# Patient Record
Sex: Male | Born: 1940 | Race: White | Hispanic: No | Marital: Married | Smoking: Never smoker
Health system: Southern US, Community
[De-identification: ages and names within clinical notes are randomized; demographics above are authoritative.]

## PROBLEM LIST (undated history)

## (undated) DIAGNOSIS — E039 Hypothyroidism, unspecified: Secondary | ICD-10-CM

## (undated) DIAGNOSIS — M353 Polymyalgia rheumatica: Secondary | ICD-10-CM

## (undated) DIAGNOSIS — R1310 Dysphagia, unspecified: Secondary | ICD-10-CM

## (undated) DIAGNOSIS — R05 Cough: Secondary | ICD-10-CM

## (undated) DIAGNOSIS — D696 Thrombocytopenia, unspecified: Secondary | ICD-10-CM

## (undated) DIAGNOSIS — M7989 Other specified soft tissue disorders: Secondary | ICD-10-CM

## (undated) DIAGNOSIS — R5383 Other fatigue: Secondary | ICD-10-CM

## (undated) DIAGNOSIS — IMO0001 Reserved for inherently not codable concepts without codable children: Secondary | ICD-10-CM

## (undated) DIAGNOSIS — D759 Disease of blood and blood-forming organs, unspecified: Secondary | ICD-10-CM

## (undated) DIAGNOSIS — N529 Male erectile dysfunction, unspecified: Secondary | ICD-10-CM

## (undated) DIAGNOSIS — M5030 Other cervical disc degeneration, unspecified cervical region: Secondary | ICD-10-CM

## (undated) DIAGNOSIS — R630 Anorexia: Secondary | ICD-10-CM

## (undated) DIAGNOSIS — I10 Essential (primary) hypertension: Secondary | ICD-10-CM

## (undated) DIAGNOSIS — K922 Gastrointestinal hemorrhage, unspecified: Secondary | ICD-10-CM

## (undated) DIAGNOSIS — E669 Obesity, unspecified: Secondary | ICD-10-CM

## (undated) DIAGNOSIS — R011 Cardiac murmur, unspecified: Secondary | ICD-10-CM

## (undated) DIAGNOSIS — E785 Hyperlipidemia, unspecified: Secondary | ICD-10-CM

## (undated) DIAGNOSIS — I890 Lymphedema, not elsewhere classified: Secondary | ICD-10-CM

## (undated) DIAGNOSIS — K589 Irritable bowel syndrome without diarrhea: Secondary | ICD-10-CM

## (undated) DIAGNOSIS — D649 Anemia, unspecified: Secondary | ICD-10-CM

## (undated) DIAGNOSIS — E079 Disorder of thyroid, unspecified: Secondary | ICD-10-CM

## (undated) DIAGNOSIS — I2510 Atherosclerotic heart disease of native coronary artery without angina pectoris: Secondary | ICD-10-CM

## (undated) DIAGNOSIS — R059 Cough, unspecified: Secondary | ICD-10-CM

## (undated) DIAGNOSIS — C801 Malignant (primary) neoplasm, unspecified: Secondary | ICD-10-CM

## (undated) DIAGNOSIS — Z5189 Encounter for other specified aftercare: Secondary | ICD-10-CM

## (undated) DIAGNOSIS — J189 Pneumonia, unspecified organism: Secondary | ICD-10-CM

## (undated) DIAGNOSIS — K219 Gastro-esophageal reflux disease without esophagitis: Secondary | ICD-10-CM

## (undated) HISTORY — DX: Anemia, unspecified: D64.9

## (undated) HISTORY — DX: Hyperlipidemia, unspecified: E78.5

## (undated) HISTORY — DX: Other fatigue: R53.83

## (undated) HISTORY — PX: OTHER SURGICAL HISTORY: SHX169

## (undated) HISTORY — PX: BONE MARROW BIOPSY: SHX199

## (undated) HISTORY — DX: Dysphagia, unspecified: R13.10

## (undated) HISTORY — DX: Atherosclerotic heart disease of native coronary artery without angina pectoris: I25.10

## (undated) HISTORY — PX: TONSILLECTOMY AND ADENOIDECTOMY: SUR1326

## (undated) HISTORY — DX: Male erectile dysfunction, unspecified: N52.9

## (undated) HISTORY — DX: Essential (primary) hypertension: I10

## (undated) HISTORY — DX: Other cervical disc degeneration, unspecified cervical region: M50.30

## (undated) HISTORY — DX: Cough, unspecified: R05.9

## (undated) HISTORY — DX: Other specified soft tissue disorders: M79.89

## (undated) HISTORY — DX: Anorexia: R63.0

## (undated) HISTORY — DX: Cough: R05

## (undated) HISTORY — DX: Obesity, unspecified: E66.9

## (undated) HISTORY — DX: Disorder of thyroid, unspecified: E07.9

## (undated) HISTORY — DX: Thrombocytopenia, unspecified: D69.6

---

## 1965-04-18 HISTORY — PX: APPENDECTOMY: SHX54

## 1999-02-11 ENCOUNTER — Ambulatory Visit (HOSPITAL_BASED_OUTPATIENT_CLINIC_OR_DEPARTMENT_OTHER): Admission: RE | Admit: 1999-02-11 | Discharge: 1999-02-11 | Payer: Self-pay | Admitting: Plastic Surgery

## 2001-09-17 ENCOUNTER — Ambulatory Visit (HOSPITAL_COMMUNITY): Admission: RE | Admit: 2001-09-17 | Discharge: 2001-09-17 | Payer: Self-pay | Admitting: Gastroenterology

## 2002-12-05 ENCOUNTER — Ambulatory Visit (HOSPITAL_COMMUNITY): Admission: RE | Admit: 2002-12-05 | Discharge: 2002-12-05 | Payer: Self-pay | Admitting: Gastroenterology

## 2003-04-19 HISTORY — PX: NASAL SINUS SURGERY: SHX719

## 2003-07-08 ENCOUNTER — Encounter: Admission: RE | Admit: 2003-07-08 | Discharge: 2003-07-08 | Payer: Self-pay | Admitting: Otolaryngology

## 2003-07-09 ENCOUNTER — Ambulatory Visit (HOSPITAL_COMMUNITY): Admission: RE | Admit: 2003-07-09 | Discharge: 2003-07-09 | Payer: Self-pay | Admitting: Otolaryngology

## 2003-07-09 ENCOUNTER — Encounter (INDEPENDENT_AMBULATORY_CARE_PROVIDER_SITE_OTHER): Payer: Self-pay | Admitting: *Deleted

## 2003-07-09 ENCOUNTER — Ambulatory Visit (HOSPITAL_BASED_OUTPATIENT_CLINIC_OR_DEPARTMENT_OTHER): Admission: RE | Admit: 2003-07-09 | Discharge: 2003-07-09 | Payer: Self-pay | Admitting: Otolaryngology

## 2004-04-18 HISTORY — PX: CORONARY ANGIOPLASTY WITH STENT PLACEMENT: SHX49

## 2004-06-11 ENCOUNTER — Encounter
Admission: RE | Admit: 2004-06-11 | Discharge: 2004-06-11 | Payer: Self-pay | Admitting: Physical Medicine and Rehabilitation

## 2005-01-28 ENCOUNTER — Inpatient Hospital Stay (HOSPITAL_COMMUNITY): Admission: AD | Admit: 2005-01-28 | Discharge: 2005-02-01 | Payer: Self-pay | Admitting: Cardiovascular Disease

## 2005-01-31 HISTORY — PX: CARDIAC CATHETERIZATION: SHX172

## 2005-02-28 HISTORY — PX: US ECHOCARDIOGRAPHY: HXRAD669

## 2005-10-19 ENCOUNTER — Ambulatory Visit (HOSPITAL_COMMUNITY): Admission: RE | Admit: 2005-10-19 | Discharge: 2005-10-19 | Payer: Self-pay | Admitting: Family Medicine

## 2006-03-18 HISTORY — PX: OTHER SURGICAL HISTORY: SHX169

## 2007-04-30 ENCOUNTER — Encounter: Admission: RE | Admit: 2007-04-30 | Discharge: 2007-04-30 | Payer: Self-pay | Admitting: Neurosurgery

## 2007-08-21 ENCOUNTER — Encounter: Payer: Self-pay | Admitting: Pulmonary Disease

## 2007-09-07 ENCOUNTER — Ambulatory Visit: Payer: Self-pay | Admitting: Pulmonary Disease

## 2007-09-07 DIAGNOSIS — E785 Hyperlipidemia, unspecified: Secondary | ICD-10-CM

## 2007-09-07 DIAGNOSIS — I10 Essential (primary) hypertension: Secondary | ICD-10-CM | POA: Insufficient documentation

## 2007-09-07 DIAGNOSIS — R059 Cough, unspecified: Secondary | ICD-10-CM | POA: Insufficient documentation

## 2007-09-07 DIAGNOSIS — R05 Cough: Secondary | ICD-10-CM | POA: Insufficient documentation

## 2007-10-12 ENCOUNTER — Emergency Department (HOSPITAL_COMMUNITY): Admission: EM | Admit: 2007-10-12 | Discharge: 2007-10-12 | Payer: Self-pay | Admitting: Emergency Medicine

## 2007-10-18 ENCOUNTER — Encounter (HOSPITAL_COMMUNITY): Admission: RE | Admit: 2007-10-18 | Discharge: 2008-01-09 | Payer: Self-pay | Admitting: Internal Medicine

## 2009-04-18 HISTORY — PX: OTHER SURGICAL HISTORY: SHX169

## 2009-04-29 ENCOUNTER — Encounter: Admission: RE | Admit: 2009-04-29 | Discharge: 2009-04-29 | Payer: Self-pay | Admitting: Neurosurgery

## 2009-06-02 ENCOUNTER — Encounter: Admission: RE | Admit: 2009-06-02 | Discharge: 2009-06-02 | Payer: Self-pay | Admitting: Neurosurgery

## 2009-06-16 DIAGNOSIS — D759 Disease of blood and blood-forming organs, unspecified: Secondary | ICD-10-CM

## 2009-06-16 HISTORY — DX: Disease of blood and blood-forming organs, unspecified: D75.9

## 2009-10-01 ENCOUNTER — Encounter: Admission: RE | Admit: 2009-10-01 | Discharge: 2009-10-01 | Payer: Self-pay | Admitting: Gastroenterology

## 2009-10-01 IMAGING — US US ABDOMEN COMPLETE
1 series · 13 of 25 positions shown · non-contrast
Comparison: [DATE].

CLINICAL DATA: 69-year-old male with abdominal pain and abnormal
liver function test.

COMPLETE ABDOMINAL ULTRASOUND

[Series 1: us abdomen complete · 13 of 116 slices shown]
[im 1/116]
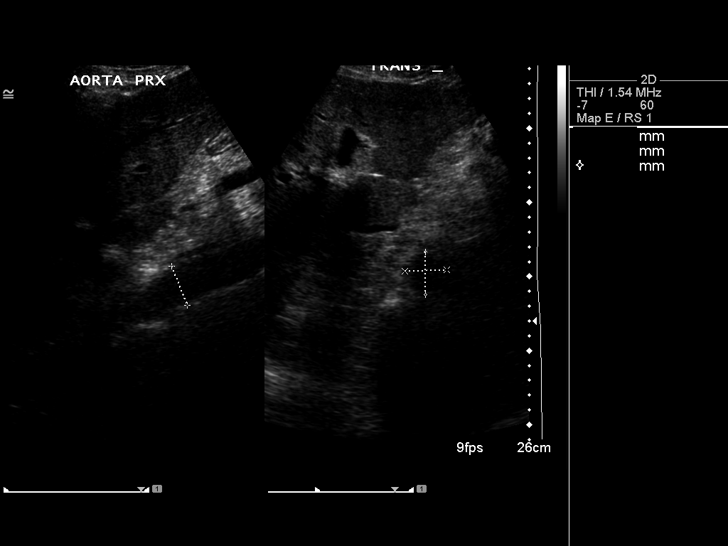
[im 10/116]
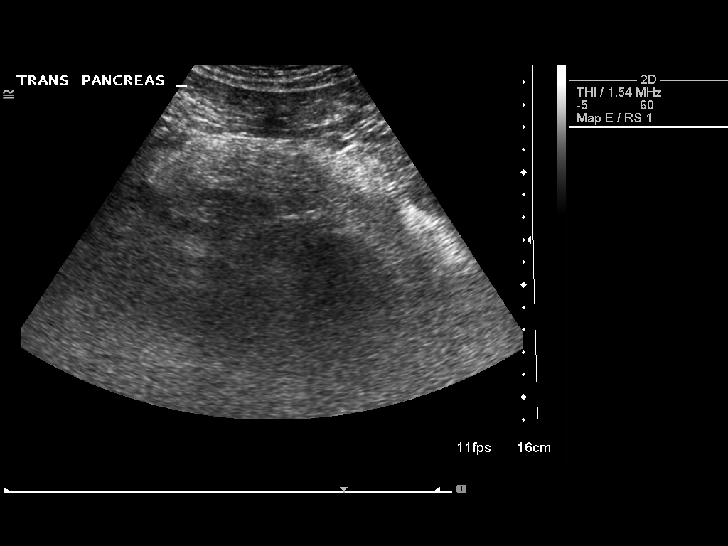
[im 20/116]
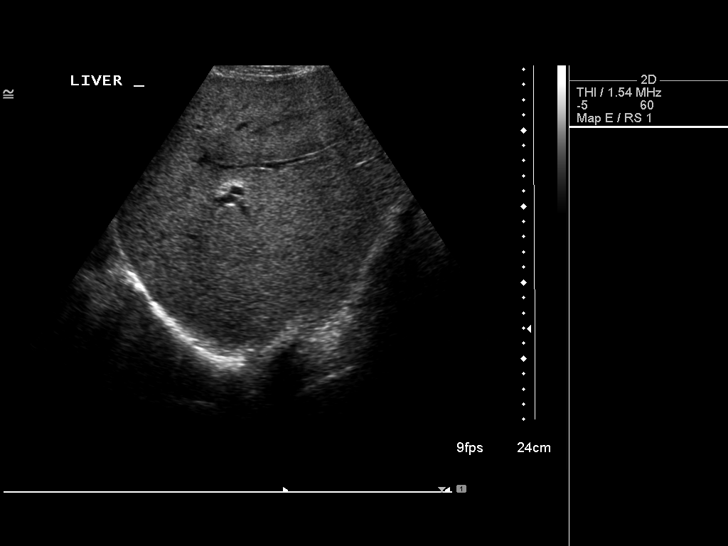
[im 29/116]
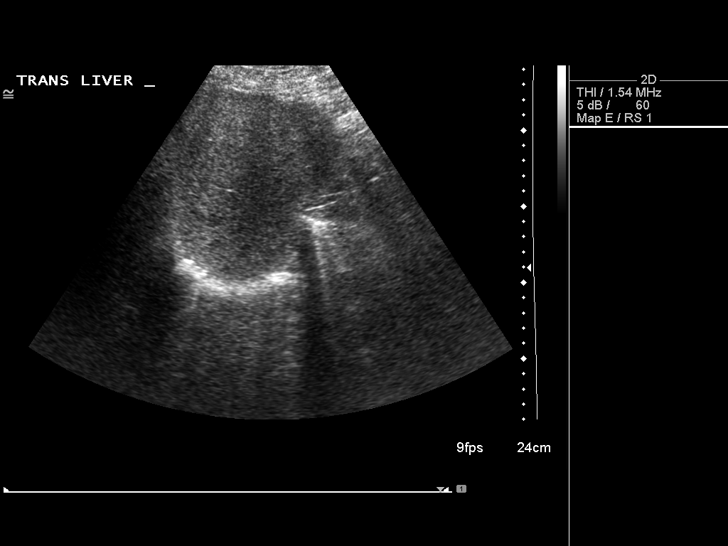
[im 39/116]
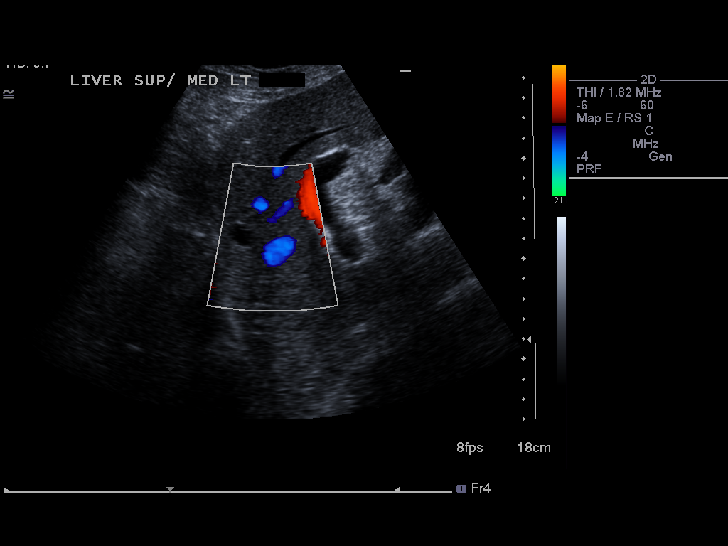
[im 48/116]
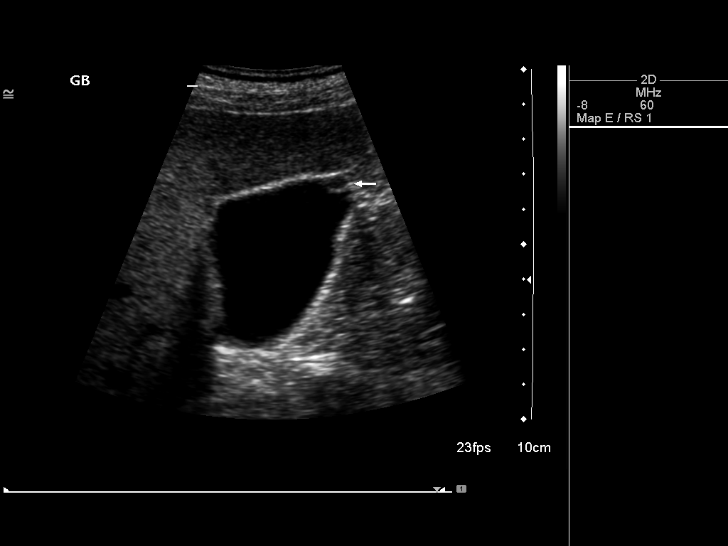
[im 58/116]
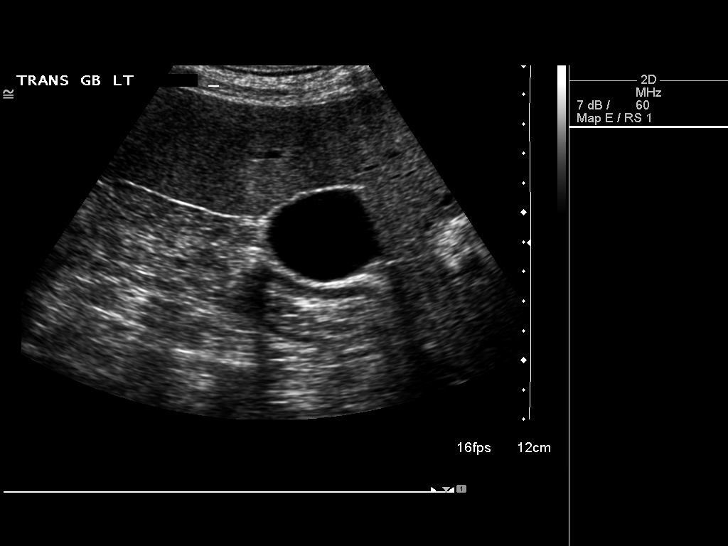
[im 68/116]
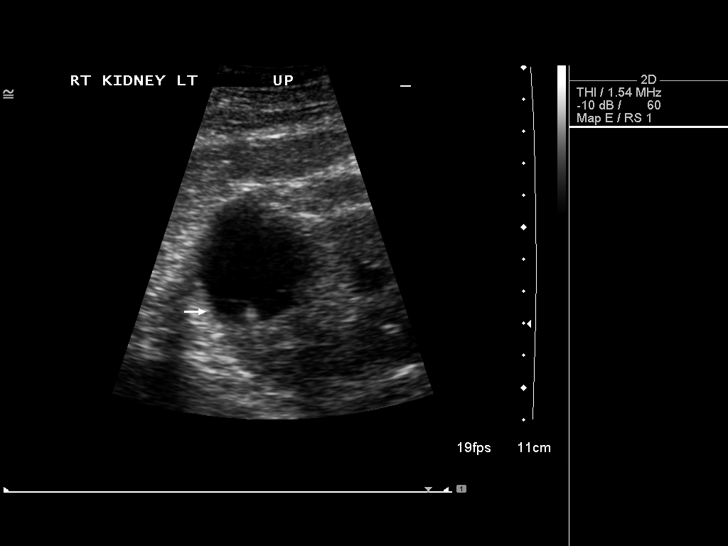
[im 77/116]
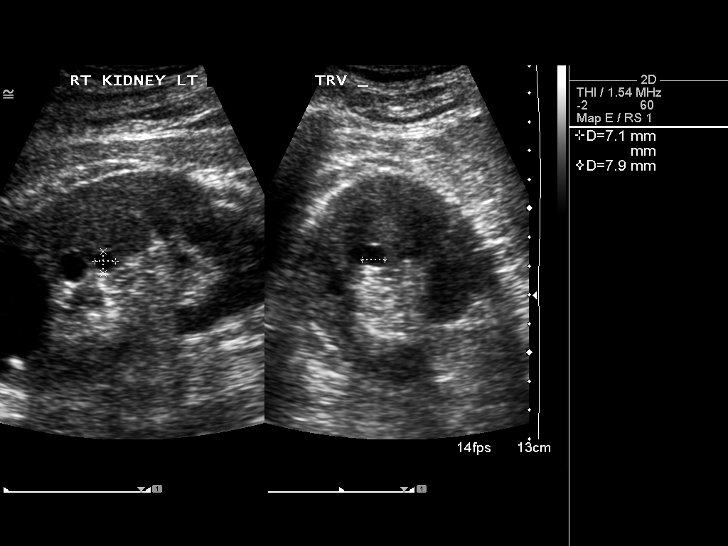
[im 87/116]
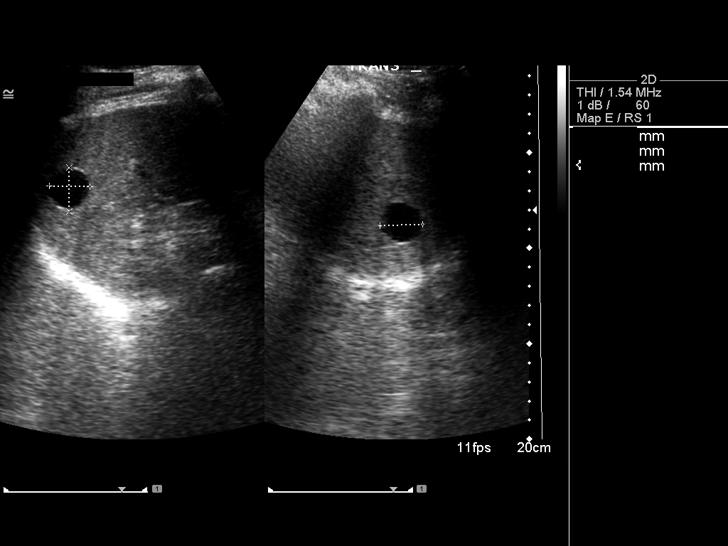
[im 96/116]
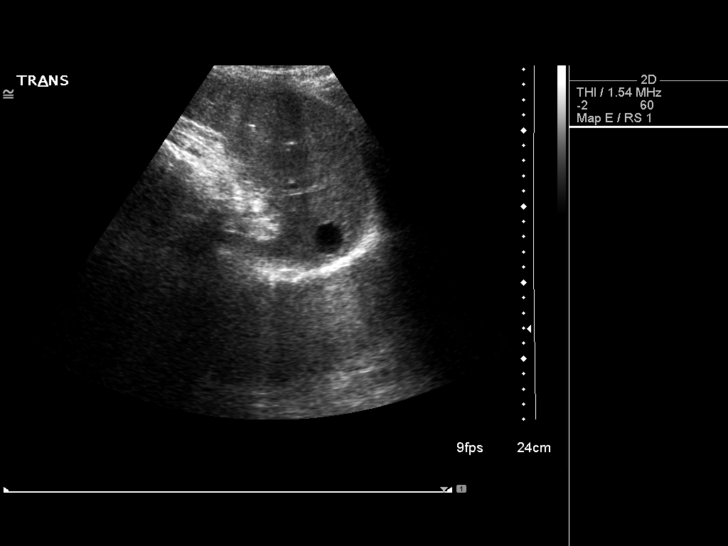
[im 106/116]
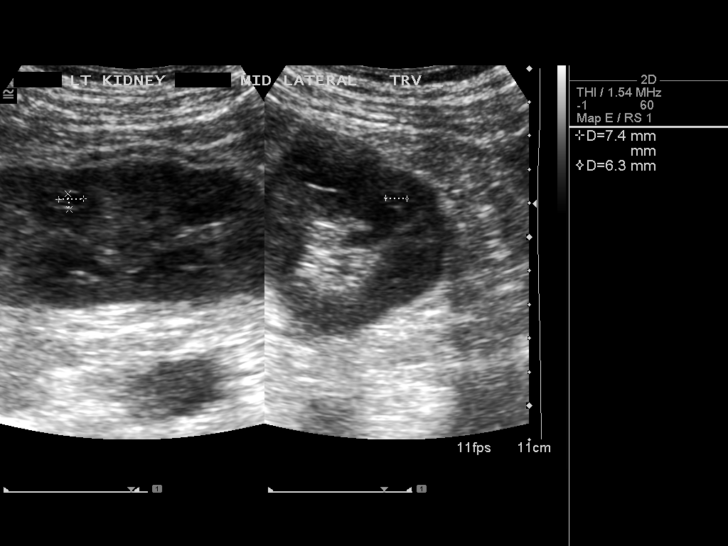
[im 116/116]
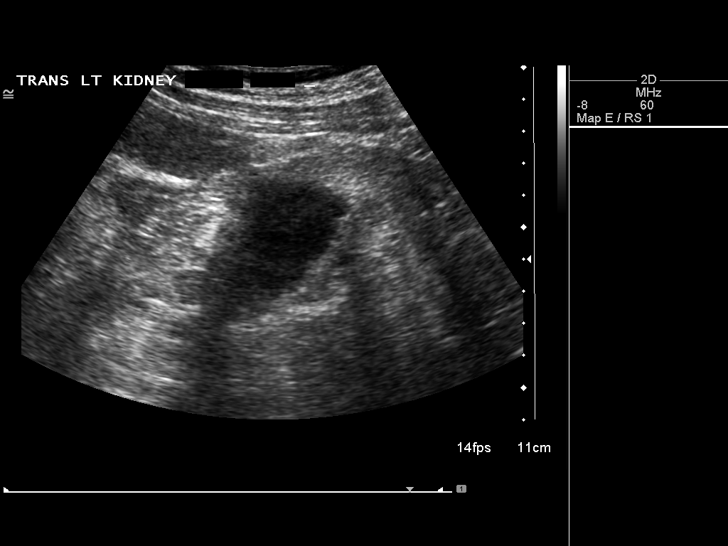

[13 of 25 positions shown; findings below may reference images not displayed]

FINDINGS: Gallbladder:  No shadowing echogenic gallstones or gallbladder wall
thickening (2 mm).  A small hypoechoic blunted area at the fundus
(image 49 of 117) measures 6 x 7 mm.  No shadowing.  No vascularity
on color Doppler interrogation.  No pericholecystic fluid.

Common bile duct:  Normal measuring 6 mm in diameter.

Liver:  Mild increased echogenicity.  Stable 16 mm cyst in the left
lobe.

IVC:  Appears normal.

Pancreas:  Incompletely visualized due to overlying bowel gas,
visualized portions within normal limits.

Spleen:  Stable 23 mm cystic area in the superior pole.
Splenomegaly, with a calculated volume of [XB] ml (Normal splenic
volume less than 412 ml).

Right Kidney:  Multiple renal cyst, the largest at the upper pole
is stable measuring 46 mm in diameter.  This has a small septation.
Renal length 13.6 mm.

Left Kidney:  Multiple cysts, the largest in the upper pole
measuring 26 mm in diameter appears new since the prior exam and
has a thin septation but no vascularity (image 108 of 117). Renal
length 11.4 cm.

Abdominal aorta:  No aneurysm identified.
IMPRESSION: 1.  No cholelithiasis or evidence of acute cholecystitis.
Subcentimeter gallbladder polyp versus phrygian cap at the fundus.
This does not require dedicated imaging follow-up.
2.  Splenomegaly (estimated volume 16 25 ml).  Stable 2.3 cm simple
splenic cyst.
3.  Mild increased hepatic echogenicity suggestive of steatosis.
Stable 16 mm cystic area in the left lobe.
4.  Multicystic renal disease, stable on the right.  A 26 mm cyst
with septation in the left kidney appears new since [XB] and is
compatible with a Bosniak II lesion.

## 2009-10-27 ENCOUNTER — Ambulatory Visit: Payer: Self-pay | Admitting: Oncology

## 2009-10-30 LAB — COMPREHENSIVE METABOLIC PANEL
Alkaline Phosphatase: 110 U/L (ref 39–117)
BUN: 16 mg/dL (ref 6–23)
CO2: 27 mEq/L (ref 19–32)
Creatinine, Ser: 0.9 mg/dL (ref 0.40–1.50)
Glucose, Bld: 80 mg/dL (ref 70–99)
Sodium: 140 mEq/L (ref 135–145)
Total Bilirubin: 0.6 mg/dL (ref 0.3–1.2)
Total Protein: 6.4 g/dL (ref 6.0–8.3)

## 2009-10-30 LAB — CBC WITH DIFFERENTIAL/PLATELET
BASO%: 0.2 % (ref 0.0–2.0)
EOS%: 0.5 % (ref 0.0–7.0)
MCH: 31 pg (ref 27.2–33.4)
MCHC: 33.2 g/dL (ref 32.0–36.0)
MONO#: 1.4 10*3/uL — ABNORMAL HIGH (ref 0.1–0.9)
RBC: 4.1 10*6/uL — ABNORMAL LOW (ref 4.20–5.82)
RDW: 15.5 % — ABNORMAL HIGH (ref 11.0–14.6)
WBC: 4.1 10*3/uL (ref 4.0–10.3)
lymph#: 0.7 10*3/uL — ABNORMAL LOW (ref 0.9–3.3)
nRBC: 0 % (ref 0–0)

## 2009-10-30 LAB — PROTHROMBIN TIME: Prothrombin Time: 12.8 seconds (ref 11.6–15.2)

## 2009-10-30 LAB — CHCC SMEAR

## 2009-10-30 LAB — TECHNOLOGIST REVIEW

## 2009-11-17 LAB — CBC WITH DIFFERENTIAL/PLATELET
BASO%: 0 % (ref 0.0–2.0)
Basophils Absolute: 0 10*3/uL (ref 0.0–0.1)
EOS%: 0.3 % (ref 0.0–7.0)
Eosinophils Absolute: 0 10*3/uL (ref 0.0–0.5)
HCT: 36.8 % — ABNORMAL LOW (ref 38.4–49.9)
HGB: 12.3 g/dL — ABNORMAL LOW (ref 13.0–17.1)
LYMPH%: 18.2 % (ref 14.0–49.0)
MCH: 31.1 pg (ref 27.2–33.4)
MCHC: 33.4 g/dL (ref 32.0–36.0)
MCV: 93.2 fL (ref 79.3–98.0)
MONO#: 1.5 10*3/uL — ABNORMAL HIGH (ref 0.1–0.9)
MONO%: 41 % — ABNORMAL HIGH (ref 0.0–14.0)
NEUT#: 1.5 10*3/uL (ref 1.5–6.5)
NEUT%: 40.5 % (ref 39.0–75.0)
Platelets: 33 10*3/uL — ABNORMAL LOW (ref 140–400)
RBC: 3.95 10*6/uL — ABNORMAL LOW (ref 4.20–5.82)
RDW: 15.7 % — ABNORMAL HIGH (ref 11.0–14.6)
WBC: 3.7 10*3/uL — ABNORMAL LOW (ref 4.0–10.3)
lymph#: 0.7 10*3/uL — ABNORMAL LOW (ref 0.9–3.3)

## 2009-11-18 LAB — COMPREHENSIVE METABOLIC PANEL
ALT: 143 U/L — ABNORMAL HIGH (ref 0–53)
AST: 100 U/L — ABNORMAL HIGH (ref 0–37)
Albumin: 4.3 g/dL (ref 3.5–5.2)
Alkaline Phosphatase: 102 U/L (ref 39–117)
BUN: 19 mg/dL (ref 6–23)
CO2: 23 mEq/L (ref 19–32)
Calcium: 9.1 mg/dL (ref 8.4–10.5)
Chloride: 101 mEq/L (ref 96–112)
Creatinine, Ser: 0.85 mg/dL (ref 0.40–1.50)
Glucose, Bld: 102 mg/dL — ABNORMAL HIGH (ref 70–99)
Potassium: 3.8 mEq/L (ref 3.5–5.3)
Sodium: 138 mEq/L (ref 135–145)
Total Bilirubin: 0.6 mg/dL (ref 0.3–1.2)
Total Protein: 6.1 g/dL (ref 6.0–8.3)

## 2009-11-18 LAB — EPSTEIN-BARR VIRUS VCA, IGG: EBV VCA IgG: 4.54 {ISR} — ABNORMAL HIGH

## 2009-11-18 LAB — EPSTEIN-BARR VIRUS EARLY D ANTIGEN ANTIBODY, IGG: EBV EA IgG: 1.28 {ISR} — ABNORMAL HIGH

## 2009-11-18 LAB — EPSTEIN-BARR VIRUS VCA, IGM: EBV VCA IgM: 0.09 {ISR}

## 2009-11-23 ENCOUNTER — Other Ambulatory Visit: Admission: RE | Admit: 2009-11-23 | Discharge: 2009-11-23 | Payer: Self-pay | Admitting: Oncology

## 2009-12-04 ENCOUNTER — Ambulatory Visit: Payer: Self-pay | Admitting: Oncology

## 2009-12-07 LAB — CBC WITH DIFFERENTIAL/PLATELET
BASO%: 0.2 % (ref 0.0–2.0)
EOS%: 0.4 % (ref 0.0–7.0)
HCT: 39 % (ref 38.4–49.9)
LYMPH%: 24.5 % (ref 14.0–49.0)
MCH: 31.3 pg (ref 27.2–33.4)
MCHC: 33.3 g/dL (ref 32.0–36.0)
MONO#: 1.4 10*3/uL — ABNORMAL HIGH (ref 0.1–0.9)
NEUT%: 44.4 % (ref 39.0–75.0)
Platelets: 39 10*3/uL — ABNORMAL LOW (ref 140–400)
RBC: 4.15 10*6/uL — ABNORMAL LOW (ref 4.20–5.82)
WBC: 4.6 10*3/uL (ref 4.0–10.3)
lymph#: 1.1 10*3/uL (ref 0.9–3.3)

## 2009-12-10 LAB — JAK-2 V617F

## 2009-12-28 ENCOUNTER — Ambulatory Visit: Payer: Self-pay | Admitting: Cardiovascular Disease

## 2009-12-29 ENCOUNTER — Ambulatory Visit: Payer: Self-pay | Admitting: Cardiovascular Disease

## 2010-01-07 ENCOUNTER — Ambulatory Visit: Payer: Self-pay | Admitting: Pulmonary Disease

## 2010-01-21 ENCOUNTER — Telehealth (INDEPENDENT_AMBULATORY_CARE_PROVIDER_SITE_OTHER): Payer: Self-pay | Admitting: *Deleted

## 2010-01-22 ENCOUNTER — Ambulatory Visit: Payer: Self-pay | Admitting: Oncology

## 2010-02-10 ENCOUNTER — Ambulatory Visit (HOSPITAL_COMMUNITY): Admission: RE | Admit: 2010-02-10 | Discharge: 2010-02-10 | Payer: Self-pay | Admitting: Internal Medicine

## 2010-02-19 ENCOUNTER — Encounter: Payer: Self-pay | Admitting: Infectious Disease

## 2010-03-09 ENCOUNTER — Other Ambulatory Visit: Payer: Self-pay | Admitting: Oncology

## 2010-03-09 ENCOUNTER — Encounter: Payer: Self-pay | Admitting: Infectious Disease

## 2010-03-09 LAB — CBC WITH DIFFERENTIAL/PLATELET
BASO%: 0.3 % (ref 0.0–2.0)
EOS%: 1.7 % (ref 0.0–7.0)
MCH: 31.8 pg (ref 27.2–33.4)
MCHC: 33.5 g/dL (ref 32.0–36.0)
RBC: 3.49 10*6/uL — ABNORMAL LOW (ref 4.20–5.82)
RDW: 15.7 % — ABNORMAL HIGH (ref 11.0–14.6)
WBC: 3.6 10*3/uL — ABNORMAL LOW (ref 4.0–10.3)
lymph#: 1 10*3/uL (ref 0.9–3.3)
nRBC: 0 % (ref 0–0)

## 2010-03-09 LAB — COMPREHENSIVE METABOLIC PANEL
ALT: 92 U/L — ABNORMAL HIGH (ref 0–53)
AST: 84 U/L — ABNORMAL HIGH (ref 0–37)
Albumin: 3.7 g/dL (ref 3.5–5.2)
Alkaline Phosphatase: 143 U/L — ABNORMAL HIGH (ref 39–117)
BUN: 19 mg/dL (ref 6–23)
Potassium: 4.2 mEq/L (ref 3.5–5.3)
Sodium: 140 mEq/L (ref 135–145)
Total Protein: 6.2 g/dL (ref 6.0–8.3)

## 2010-03-23 ENCOUNTER — Encounter: Payer: Self-pay | Admitting: Infectious Disease

## 2010-03-24 ENCOUNTER — Encounter: Payer: Self-pay | Admitting: Oncology

## 2010-03-24 ENCOUNTER — Ambulatory Visit
Admission: RE | Admit: 2010-03-24 | Discharge: 2010-03-24 | Payer: Self-pay | Source: Home / Self Care | Attending: Oncology | Admitting: Oncology

## 2010-03-29 ENCOUNTER — Ambulatory Visit: Payer: Self-pay | Admitting: Infectious Disease

## 2010-03-29 ENCOUNTER — Encounter: Payer: Self-pay | Admitting: Infectious Disease

## 2010-03-29 DIAGNOSIS — Z8719 Personal history of other diseases of the digestive system: Secondary | ICD-10-CM

## 2010-03-29 DIAGNOSIS — E039 Hypothyroidism, unspecified: Secondary | ICD-10-CM

## 2010-03-29 DIAGNOSIS — R161 Splenomegaly, not elsewhere classified: Secondary | ICD-10-CM

## 2010-03-29 DIAGNOSIS — Z9189 Other specified personal risk factors, not elsewhere classified: Secondary | ICD-10-CM | POA: Insufficient documentation

## 2010-03-29 LAB — CONVERTED CEMR LAB
ALT: 100 units/L — ABNORMAL HIGH (ref 0–53)
AST: 78 units/L — ABNORMAL HIGH (ref 0–37)
Albumin: 4.5 g/dL (ref 3.5–5.2)
Alkaline Phosphatase: 213 units/L — ABNORMAL HIGH (ref 39–117)
BUN: 18 mg/dL (ref 6–23)
Basophils Absolute: 0.1 10*3/uL (ref 0.0–0.1)
Basophils Relative: 1 % (ref 0–1)
CO2: 27 meq/L (ref 19–32)
Calcium: 9.4 mg/dL (ref 8.4–10.5)
Chloride: 106 meq/L (ref 96–112)
Creatinine, Ser: 0.74 mg/dL (ref 0.40–1.50)
EBV NA IgG: 3.37 — ABNORMAL HIGH
EBV VCA IgG: 4.81 — ABNORMAL HIGH
EBV VCA IgM: 0.08
Eosinophils Absolute: 0.3 10*3/uL (ref 0.0–0.7)
Eosinophils Relative: 5 % (ref 0–5)
Ferritin: 387 ng/mL — ABNORMAL HIGH (ref 22–322)
Glucose, Bld: 88 mg/dL (ref 70–99)
HCT: 46 % (ref 39.0–52.0)
Hemoglobin: 16 g/dL (ref 13.0–17.0)
Lymphocytes Relative: 29 % (ref 12–46)
Lymphs Abs: 1.7 10*3/uL (ref 0.7–4.0)
MCHC: 34.8 g/dL (ref 30.0–36.0)
MCV: 87 fL (ref 78.0–100.0)
Monocytes Absolute: 0.7 10*3/uL (ref 0.1–1.0)
Monocytes Relative: 12 % (ref 3–12)
Neutro Abs: 3.1 10*3/uL (ref 1.7–7.7)
Neutrophils Relative %: 54 % (ref 43–77)
Platelets: 262 10*3/uL (ref 150–400)
Potassium: 4.8 meq/L (ref 3.5–5.3)
RBC: 5.29 M/uL (ref 4.22–5.81)
RDW: 14.5 % (ref 11.5–15.5)
Sodium: 144 meq/L (ref 135–145)
Total Bilirubin: 0.6 mg/dL (ref 0.3–1.2)
Total Protein: 6.8 g/dL (ref 6.0–8.3)
WBC: 5.8 10*3/uL (ref 4.0–10.5)

## 2010-03-31 ENCOUNTER — Encounter: Payer: Self-pay | Admitting: Infectious Disease

## 2010-04-05 ENCOUNTER — Ambulatory Visit: Payer: Self-pay | Admitting: Oncology

## 2010-04-06 ENCOUNTER — Encounter: Payer: Self-pay | Admitting: Infectious Disease

## 2010-04-06 LAB — CBC WITH DIFFERENTIAL/PLATELET
Basophils Absolute: 0 10*3/uL (ref 0.0–0.1)
Eosinophils Absolute: 0 10*3/uL (ref 0.0–0.5)
HGB: 11.4 g/dL — ABNORMAL LOW (ref 13.0–17.1)
LYMPH%: 16.6 % (ref 14.0–49.0)
MONO#: 1.8 10*3/uL — ABNORMAL HIGH (ref 0.1–0.9)
NEUT#: 2.4 10*3/uL (ref 1.5–6.5)
Platelets: 47 10*3/uL — ABNORMAL LOW (ref 140–400)
RBC: 3.58 10*6/uL — ABNORMAL LOW (ref 4.20–5.82)
WBC: 5 10*3/uL (ref 4.0–10.3)
nRBC: 0 % (ref 0–0)

## 2010-04-07 ENCOUNTER — Ambulatory Visit: Payer: Self-pay | Admitting: Pulmonary Disease

## 2010-04-24 ENCOUNTER — Encounter
Admission: RE | Admit: 2010-04-24 | Discharge: 2010-04-24 | Payer: Self-pay | Source: Home / Self Care | Attending: Neurosurgery | Admitting: Neurosurgery

## 2010-04-26 ENCOUNTER — Encounter
Admission: RE | Admit: 2010-04-26 | Discharge: 2010-04-26 | Payer: Self-pay | Source: Home / Self Care | Attending: Gastroenterology | Admitting: Gastroenterology

## 2010-04-26 ENCOUNTER — Encounter: Payer: Self-pay | Admitting: Infectious Disease

## 2010-04-27 ENCOUNTER — Encounter: Payer: Self-pay | Admitting: Infectious Disease

## 2010-05-09 ENCOUNTER — Encounter: Payer: Self-pay | Admitting: Neurosurgery

## 2010-05-11 ENCOUNTER — Ambulatory Visit: Payer: Self-pay | Admitting: Oncology

## 2010-05-13 LAB — CBC WITH DIFFERENTIAL/PLATELET
BASO%: 0.6 % (ref 0.0–2.0)
Basophils Absolute: 0 10*3/uL (ref 0.0–0.1)
EOS%: 2.5 % (ref 0.0–7.0)
Eosinophils Absolute: 0.1 10*3/uL (ref 0.0–0.5)
HCT: 35 % — ABNORMAL LOW (ref 38.4–49.9)
HGB: 11.8 g/dL — ABNORMAL LOW (ref 13.0–17.1)
LYMPH%: 29.9 % (ref 14.0–49.0)
MCH: 31.7 pg (ref 27.2–33.4)
MCHC: 33.7 g/dL (ref 32.0–36.0)
MCV: 94.1 fL (ref 79.3–98.0)
MONO#: 0.8 10*3/uL (ref 0.1–0.9)
MONO%: 24.7 % — ABNORMAL HIGH (ref 0.0–14.0)
NEUT#: 1.4 10*3/uL — ABNORMAL LOW (ref 1.5–6.5)
NEUT%: 42.3 % (ref 39.0–75.0)
Platelets: 34 10*3/uL — ABNORMAL LOW (ref 140–400)
RBC: 3.72 10*6/uL — ABNORMAL LOW (ref 4.20–5.82)
RDW: 15.3 % — ABNORMAL HIGH (ref 11.0–14.6)
WBC: 3.2 10*3/uL — ABNORMAL LOW (ref 4.0–10.3)
lymph#: 1 10*3/uL (ref 0.9–3.3)
nRBC: 0 % (ref 0–0)

## 2010-05-13 LAB — COMPREHENSIVE METABOLIC PANEL
ALT: 71 U/L — ABNORMAL HIGH (ref 0–53)
AST: 61 U/L — ABNORMAL HIGH (ref 0–37)
Albumin: 3.9 g/dL (ref 3.5–5.2)
Alkaline Phosphatase: 142 U/L — ABNORMAL HIGH (ref 39–117)
BUN: 20 mg/dL (ref 6–23)
CO2: 27 mEq/L (ref 19–32)
Calcium: 9 mg/dL (ref 8.4–10.5)
Chloride: 100 mEq/L (ref 96–112)
Creatinine, Ser: 0.85 mg/dL (ref 0.40–1.50)
Glucose, Bld: 99 mg/dL (ref 70–99)
Potassium: 3.8 mEq/L (ref 3.5–5.3)
Sodium: 137 mEq/L (ref 135–145)
Total Bilirubin: 0.7 mg/dL (ref 0.3–1.2)
Total Protein: 6.6 g/dL (ref 6.0–8.3)

## 2010-05-13 LAB — TSH: TSH: 8.506 u[IU]/mL — ABNORMAL HIGH (ref 0.350–4.500)

## 2010-05-13 LAB — TECHNOLOGIST REVIEW

## 2010-05-14 LAB — TESTOSTERONE: Testosterone: 174.5 ng/dL — ABNORMAL LOW (ref 250–890)

## 2010-05-18 NOTE — Assessment & Plan Note (Signed)
Summary: acute sick visit for persistent cough   Copy to:  Dr. Geoffry Paradise Primary Janeen Watson/Referring Malyia Moro:  Jacky Kindle  CC:  Pt ;ast seen 09/07/2007 for cough. Pt states he has a sparatic nonproductive  cough. Pt states he has coughing attacks when he is sleeping. Pt states he coughs til he is out of breath. Pt states all this has been going on for about 9 months now..  History of Present Illness: the pt comes in today for evaluation of cough.  I last saw him back in 2009 where he had just gotten over a cough episode that sounded more upper airway than lower in origin.  I educated him about cyclical cough, and reviewed some of the behavioral therapies that may help him.  He returns today where his cough is bothering him again.  He describes paroxysms of cough that leave him breathless, and can keep him from going to sleep.  This has been an issue for 9mos now.  He has been tried on symbicort without significant relief.  He also tells me that he has been diagnosed with some type of esophageal stricture that requires periodic dilatation, and does describe symptoms of dysphagia/reflux.  Current Medications (verified): 1)  Benicar 40 Mg  Tabs (Olmesartan Medoxomil) .... One Tablet Daily. 2)  Vitamin D3 2000 Unit Caps (Cholecalciferol) .... One Tablet Once Daily 3)  Plavix 75 Mg Tabs (Clopidogrel Bisulfate) .... One Tablet Once Daily 4)  Fish Oil 1000 Mg Caps (Omega-3 Fatty Acids) .... One Tablet Two Times A Day 5)  Zantac 150 Mg Tabs (Ranitidine Hcl) .... One Tablet Once Daily 6)  Symbicort 160-4.5 Mcg/act Aero (Budesonide-Formoterol Fumarate) .... 2 Puffs Two Times A Day 7)  Synthroid .... One Tablet Once Daily  Allergies (verified): 1)  ! Codeine 2)  ! * Promethazine  Past History:  Past medical, surgical, family and social histories (including risk factors) reviewed, and no changes noted (except as noted below).  Past Medical History: Reviewed history from 09/07/2007 and no changes  required.  Current Problems:  HYPERTENSION (ICD-401.9) HYPERLIPIDEMIA (ICD-272.4) history of coronary artery disease with percutaneous intervention  Past Surgical History: Reviewed history from 09/07/2007 and no changes required. status post appendectomy status post sinus surgery of unknown type  Family History: Reviewed history from 09/07/2007 and no changes required. Father - heart disease Paternal Grandmother - asthma  Social History: Reviewed history from 09/07/2007 and no changes required. 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  The patient denies shortness of breath with activity, shortness of breath at rest, productive cough, coughing up blood, chest pain, irregular heartbeats, acid heartburn, indigestion, loss of appetite, weight change, abdominal pain, sore throat, tooth/dental problems, headaches, nasal congestion/difficulty breathing through nose, sneezing, ear ache, anxiety, depression, hand/feet swelling, rash, change in color of mucus, and fever.    Vital Signs:  Patient profile:   70 year old male Height:      69 inches Weight:      201 pounds BMI:     29.79 O2 Sat:      97 % on Room air Temp:     98.8 degrees F oral Pulse rate:   99 / minute BP sitting:   124 / 88  (left arm) Cuff size:   regular  Vitals Entered By: Carver Fila (January 07, 2010 9:08 AM)  O2 Flow:  Room air CC:  Pt ;ast seen 09/07/2007 for cough. Pt states he has a sparatic nonproductive  cough. Pt states he has coughing attacks when he is sleeping. Pt states he coughs til he is out of breath. Pt states all this has been going on for about 9 months now. Comments meds and allergies updated Phone number updated Carver Fila  January 07, 2010 9:08 AM    Physical Exam  General:  ow male in nad Nose:  no purulence or drainage noted.  Septum  midline Mouth:  no exudates or lesions. Neck:  no jvd, tmg, LN Lungs:  totally clear to auscultation no wheezing or rhonchi Heart:  rrr, no mrg Extremities:  no edema or cyanosis  Neurologic:  alert and oriented, moves all 4.   Impression & Recommendations:  Problem # 1:  COUGH (ICD-786.2) the pt's cough continues to sound more upper airway in origin.  He clearly has a cyclical cough component, and I also wonder about LPR given his known esophageal stricture.  I would like to stop his inhalers and intensify his reflux treatment.  I also reviewed with him again the behavioral therapies that may help with a cyclical cough.  If he continues to have issues with ongoing cough, would consider methacholine challenge to put issue of airways disease to rest.  He may also need further GI input.  Medications Added to Medication List This Visit: 1)  Vitamin D3 2000 Unit Caps (Cholecalciferol) .... One tablet once daily 2)  Plavix 75 Mg Tabs (Clopidogrel bisulfate) .... One tablet once daily 3)  Fish Oil 1000 Mg Caps (Omega-3 fatty acids) .... One tablet two times a day 4)  Zantac 150 Mg Tabs (Ranitidine hcl) .... One tablet once daily 5)  Symbicort 160-4.5 Mcg/act Aero (Budesonide-formoterol fumarate) .... 2 puffs two times a day 6)  Synthroid  .... One tablet once daily 7)  Tessalon Perles 100 Mg Caps (Benzonatate) .... Two by mouth every 6 hrs if needed for cough  Other Orders: Est. Patient Level IV (16109)  Patient Instructions: 1)  stop symbicort 2)  stop zantac, and start dexilant 60mg  each day 3)  if you develop postnasal drip, start chlorpheniramine 8mg  at bedtime 4)  limit voice use as much as possible for next 2 weeks..no singing or yelling. 5)  hard candy to bathe back of throat all during the day..no menthol or peppermint. 6)  no throat clearing 7)  tessalon pearls as directed for cough 8)  please call me in 2 weeks with your progress.  Prescriptions: TESSALON PERLES 100 MG  CAPS  (BENZONATATE) two by mouth every 6 hrs if needed for cough  #30 x 1   Entered and Authorized by:   Barbaraann Share MD   Signed by:   Barbaraann Share MD on 01/07/2010   Method used:   Print then Give to Patient   RxID:   [id number]    Immunization History:  Influenza Immunization History:    Influenza:  historical (01/16/2009)  Pneumovax Immunization History:    Pneumovax:  historical (01/16/2006)

## 2010-05-18 NOTE — Progress Notes (Signed)
Summary: cough 50 % better> ? dexilant rx  Phone Note Call from Patient Call back at [phone]   Caller: Spouse-Jo Call For: Clance Summary of Call: Pt spouse calling requesting Rx for Delilant 60mg  be [facility] to [facility] [address] Samples given at last OV. OKAY TO LEAVE MSG ON MACHINE PER SPOUSE JO Initial call taken by: Zackery Barefoot CMA,  January 21, 2010 12:13 PM  Follow-up for Phone Call        [facility].  How is his cough doing? LImiting voice? any PND? using hard candy to bathe throat? Aundra Millet Reynolds LPN  January 21, 2010 12:41 PM   Spoke with Alvino Chapel, pt's spouse.  She states that pt's cough is doing about 50% better, still trying to limit his voice and using tessalon when needed and this helps.  Not really having any PND anylonger.  Would like rx for dexilant, just ran out of samples.  Pls advise if okay Follow-up by: Vernie Murders,  January 21, 2010 3:08 PM  Additional Follow-up for Phone Call Additional follow up Details #1::        ok to call in dexilant 60mg  one each day #30, 6 fills.  I would like him to give this another 4 weeks.  IF he continues to improve, he will obviously need this medication, and would suggest seeing GI again Additional Follow-up by: Barbaraann Share MD,  January 21, 2010 4:17 PM    Additional Follow-up for Phone Call Additional follow up Details #2::    [facility] for pt's spouse on her cell per her request with the above recs per [facility].  Advised to pls call back w/ any questions.  Rx for dexilant was sent to pharm. Follow-up by: Vernie Murders,  January 21, 2010 4:32 PM  New/Updated Medications: DEXILANT 60 MG CPDR (DEXLANSOPRAZOLE) 1 once daily Prescriptions: DEXILANT 60 MG CPDR (DEXLANSOPRAZOLE) 1 once daily  #30 x 6   Entered by:   Vernie Murders   Authorized by:   Barbaraann Share MD   Signed by:   Vernie Murders on 01/21/2010   Method used:   [facility] to        [phone] (retail)       [address]       [place], [place]  [address]  Ph: [phone] or [phone]       Fax: [phone]   RxID:   [id number]

## 2010-05-18 NOTE — Consult Note (Signed)
[facility]   Imported By: Florinda Marker 03/24/2010 10:23:54  _____________________________________________________________________  External Attachment:    Type:   Image     Comment:   External Document

## 2010-05-20 ENCOUNTER — Encounter (INDEPENDENT_AMBULATORY_CARE_PROVIDER_SITE_OTHER): Payer: Self-pay | Admitting: *Deleted

## 2010-05-20 NOTE — Consult Note (Signed)
[facility]: [facility]   Imported By: Florinda Marker 04/07/2010 16:08:37  _____________________________________________________________________  External Attachment:    Type:   Image     Comment:   External Document

## 2010-05-20 NOTE — Assessment & Plan Note (Signed)
Summary: new pt fever unknown/   Visit Type:  Consult Referring Provider:  Dr. Geoffry Paradise Primary Provider:  Jacky Kindle  CC:  fever and splenomegaly.  History of Present Illness: 70 yo man who developed pneumonia in February, treated with zpack, and prednisone, felt better. In May developed wheezing and fatigue and saw Dr. Jacky Kindle. Symptoms persisted adn saw Dr. Jacky Kindle found to have pancytopenia (predominant thrombocytpenia) and elevated tranaminases Liver and spleen were enlarged. Stopped labetalol and crestor. Weight loss worsening and lost up to 40#. Saw Dr. Truett Perna in July. He did bone marrow biopsy which was inconclusive. He sent pt to [facility] and saw Reather Converse and Raye Sorrow with Heme ONC. They did bone marrow biopsy. this showed a hypercellular marrow but no malignancy. He had an excisional lymph node removal at [facility] which failed to show a malignancy. He has seroma there at operative site. He has tested negative fof HIV (ab and pooled pcr) viral hepatides, CMV IgG pos, IgM neg, monospot negative, EBV ab results?, ACE level was normal, lDh normal, peripheral smear some atypical lymphocyte. He has throughout had a hyperpigmented area on his lower extremities bilaterally and was seen by Dr. Westley Hummer the interim he was found to have hypothyroidism. Since this has been treated his symptoms have improved though he is still profoundly fatigued. His fevers have abated. He had doppler last week with Dr. Truett Perna to rule out phlebitis He has been without fever since October 22nd. Marland Kitchen   He has had more energy since then.  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 NO tb exposure. No pets no animal exposure recently. No hx of water sports, exposure. I spent over 60 minutes with the pt and his wife including greater than 50% of time in face to face consultatoin and with coordination of care.   Current Allergies: ! CODEINE ! * PROMETHAZINE ! * CLINDAMICIN ! * CEFDINIE Past History:  Past  Medical History: hepatosplenomegaly pancytopenia HYPERTENSION (ICD-401.9) HYPERLIPIDEMIA (ICD-272.4) history of coronary artery disease with percutaneous intervention  Vital Signs:  Patient profile:   70 year old male Height:      69 inches (175.26 cm) Weight:      198.25 pounds (90.11 kg) BMI:     29.38 Temp:     98.2 degrees F (36.78 degrees C) oral Pulse rate:   75 / minute BP sitting:   125 / 76  (left arm)  Vitals Entered By: Starleen Arms CMA (March 29, 2010 11:13 AM) CC: fever, splenomegaly Is Patient Diabetic? No Pain Assessment Patient in pain? no      Nutritional Status BMI of 25 - 29 = overweight Nutritional Status Detail nl  Does patient need assistance? Functional Status Self care Ambulation Normal   Physical Exam  General:  alert and well-hydrated.   Head:  normocephalic, atraumatic, and no abnormalities observed.   Eyes:  vision grossly intact, pupils equal, pupils round, pupils reactive to light, and no injection.   Ears:  no external deformities.   Nose:  no external deformity and no external erythema.   Mouth:  pharynx pink and moist, no erythema, and no exudates.   Neck:  supple and full ROM.   Chest Wall:  no deformities.   Lungs:  normal respiratory effort, no crackles, and no wheezes.   Heart:  normal rate, regular rhythm, no murmur, no gallop, and no rub.   Abdomen:  soft.  minimally tender to palpation, has liver and spleen both palpated well below costal margin  Genitalia:  circumcised.   Msk:  normal ROM and no joint tenderness.   Extremities:  1+ left pedal edema and 1+ right pedal edema.   Neurologic:  alert & oriented X3, strength normal in all extremities, and gait normal.   Skin:  hyperpigmented skin on bilateral lower extremities, fairly confluent but not a cellulitis Cervical Nodes:  few freely movable cervical lymph nodes Axillary Nodes:  no axillayr lano R axillary adenopathy and no L axillary adenopathy.   Inguinal Nodes:   no L inguinal adenopathy.  right side with large fluid collection Psych:  Oriented X3, memory intact for recent and remote, and good eye contact.     Impression & Recommendations:  Problem # 1:  SPLENOMEGALY (ICD-789.2)  I will get repeat cbc, cmp, esr, crp today, check ebv serologies, rpr, quantiferon gold (though no graunluma seen) leptospirosis and coxiella abs (though hx not right for them), malaria/babesia smear. I will vaccinate pt for pneumococcus today. He will need meningococcal vaccine, h flu 14 days prior to splenectomy should he have this. I aam skeptical that we are going to have san answer to the cause of his hepatosplenomegaly until we have repeat tissue sampling. Lymphoma remains a concern despite the negative bone marrow bx x2, LN bx and liver bx. Acute EBV is possible though unusual for someone of his age. I would ordnarily do a CT chest abdomen to workup FUO, but not sure how much we will gain from this unless we have another clue discovered or target for biopsy. Will discuss ct with Dr. Truett Perna.  Orders: T-Malaria Smear [phone]) T-CMP with estimated GFR ([phone]) T-CBC w/Diff [phone]) T-Epstein-Barr virus, early antigen [phone]) T-Epstein-Barr virus, nuclear antigen [phone]) T-Epstein-Barr virus, viral capsid ([phone]) T-Syphilis Test (RPR) [phone]) T- * Misc. Laboratory test [phone]) T- * Misc. Laboratory test [phone]) T- * Misc. Laboratory test [phone]) T- * Misc. Laboratory test [phone]) T- * Misc. Laboratory test [phone]) Consultation Level V [phone])  Problem # 2:  HEPATOMEGALY, HX OF (ICD-V12.79) Assessment: Unchanged  see above discussion. Fatty liver with some hemosiderin seen on biopsy with minimal fibrosis.  Orders: Consultation Level V (70623)  Problem # 3:  FEVER, HX OF (ICD-V15.9)  see above discussion  Orders: Consultation Level V (76283)  Problem # 4:  HYPOTHYROIDISM (ICD-244.9)  likely played role in fatigue but  cant explain the rest of his pathology  Orders: Consultation Level V (15176)  Other Orders: Influenza Vaccine MCR (16073) Pneumococcal Vaccine (71062) Admin 1st Vaccine (69485)  Patient Instructions: 1)  We will check some blood work today 2)  I will talk to Dr. Alcide Evener about your case 3)  we will give you flu and pneumonia shots today 4)  we will consider ct chest abdomen and pelvis 5)  rtc to see Dr. Daiva Eves in 1-2 months   Immunizations Administered:  Influenza Vaccine # 1:    Vaccine Type: Fluvax MCR    Site: left deltoid    Dose: 0.5 ml    Route: IM    Given by: Starleen Arms CMA    Exp. Date: 05/19/2010    Lot #: [id number]    VIS given: 11/10/09 version given March 29, 2010.  Pneumonia Vaccine:    Vaccine Type: Pneumovax    Site: right deltoid    Mfr: Merck    Dose: 1.0 ml    Route: IM    Given by: Starleen Arms CMA    Exp. Date: 08/26/2011    Lot #: [id number]  VIS given: 03/23/09 version given March 29, 2010.  Flu Vaccine Consent Questions:    Do you have a history of severe allergic reactions to this vaccine? no    Any prior history of allergic reactions to egg and/or gelatin? no    Do you have a sensitivity to the preservative Thimersol? no    Do you have a past history of Guillan-Barre Syndrome? no    Do you currently have an acute febrile illness? no    Have you ever had a severe reaction to latex? no    Vaccine information given and explained to patient? yes

## 2010-05-20 NOTE — Consult Note (Signed)
Summary: [facility] 09/29/09  [facility] 09/29/09   Imported By: Florinda Marker 04/27/2010 11:03:00  _____________________________________________________________________  External Attachment:    Type:   Image     Comment:   External Document

## 2010-05-20 NOTE — Consult Note (Signed)
Summary: [facility]   Imported By: Florinda Marker 04/26/2010 14:16:27  _____________________________________________________________________  External Attachment:    Type:   Image     Comment:   External Document

## 2010-05-20 NOTE — Consult Note (Signed)
Summary: New Pt. Referral: [facility] 02/26/10  New Pt. Referral: [facility] 02/26/10   Imported By: Florinda Marker 04/26/2010 15:28:21  _____________________________________________________________________  External Attachment:    Type:   Image     Comment:   External Document

## 2010-05-20 NOTE — Miscellaneous (Signed)
Summary: HIPAA Restrictions  HIPAA Restrictions   Imported By: Florinda Marker 03/31/2010 08:57:33  _____________________________________________________________________  External Attachment:    Type:   Image     Comment:   External Document

## 2010-05-22 ENCOUNTER — Encounter: Payer: Self-pay | Admitting: Infectious Disease

## 2010-05-26 NOTE — Miscellaneous (Signed)
Summary: Problem list update  Clinical Lists Changes  Problems: Added new problem of LONG-TERM (CURRENT) USE OF OTHER MEDICATIONS (ICD-V58.69) 

## 2010-05-31 ENCOUNTER — Other Ambulatory Visit: Payer: Self-pay | Admitting: Oncology

## 2010-05-31 ENCOUNTER — Encounter (HOSPITAL_BASED_OUTPATIENT_CLINIC_OR_DEPARTMENT_OTHER): Payer: Medicare Other | Admitting: Oncology

## 2010-05-31 DIAGNOSIS — R161 Splenomegaly, not elsewhere classified: Secondary | ICD-10-CM

## 2010-05-31 DIAGNOSIS — R630 Anorexia: Secondary | ICD-10-CM

## 2010-05-31 DIAGNOSIS — R609 Edema, unspecified: Secondary | ICD-10-CM

## 2010-05-31 DIAGNOSIS — D61818 Other pancytopenia: Secondary | ICD-10-CM

## 2010-05-31 LAB — CBC WITH DIFFERENTIAL/PLATELET
BASO%: 0.3 % (ref 0.0–2.0)
Basophils Absolute: 0 10*3/uL (ref 0.0–0.1)
EOS%: 2.3 % (ref 0.0–7.0)
Eosinophils Absolute: 0.1 10*3/uL (ref 0.0–0.5)
HCT: 33.8 % — ABNORMAL LOW (ref 38.4–49.9)
HGB: 11 g/dL — ABNORMAL LOW (ref 13.0–17.1)
LYMPH%: 36.7 % (ref 14.0–49.0)
MCH: 31.3 pg (ref 27.2–33.4)
MCHC: 32.5 g/dL (ref 32.0–36.0)
MCV: 96.3 fL (ref 79.3–98.0)
MONO#: 0.6 10*3/uL (ref 0.1–0.9)
MONO%: 17.8 % — ABNORMAL HIGH (ref 0.0–14.0)
NEUT#: 1.5 10*3/uL (ref 1.5–6.5)
NEUT%: 42.9 % (ref 39.0–75.0)
Platelets: 48 10*3/uL — ABNORMAL LOW (ref 140–400)
RBC: 3.51 10*6/uL — ABNORMAL LOW (ref 4.20–5.82)
RDW: 15.9 % — ABNORMAL HIGH (ref 11.0–14.6)
WBC: 3.5 10*3/uL — ABNORMAL LOW (ref 4.0–10.3)
lymph#: 1.3 10*3/uL (ref 0.9–3.3)
nRBC: 0 % (ref 0–0)

## 2010-06-09 ENCOUNTER — Encounter: Payer: Self-pay | Admitting: Infectious Disease

## 2010-06-11 ENCOUNTER — Encounter: Payer: Self-pay | Admitting: Infectious Disease

## 2010-06-14 ENCOUNTER — Ambulatory Visit (HOSPITAL_COMMUNITY)
Admission: RE | Admit: 2010-06-14 | Discharge: 2010-06-14 | Disposition: A | Discharge status: Home / Self Care | Payer: Medicare Other | Source: Ambulatory Visit | Attending: Oncology | Admitting: Oncology

## 2010-06-14 DIAGNOSIS — R161 Splenomegaly, not elsewhere classified: Secondary | ICD-10-CM | POA: Insufficient documentation

## 2010-06-14 DIAGNOSIS — R160 Hepatomegaly, not elsewhere classified: Secondary | ICD-10-CM | POA: Insufficient documentation

## 2010-06-14 DIAGNOSIS — K7689 Other specified diseases of liver: Secondary | ICD-10-CM | POA: Insufficient documentation

## 2010-06-14 DIAGNOSIS — N281 Cyst of kidney, acquired: Secondary | ICD-10-CM | POA: Insufficient documentation

## 2010-06-15 ENCOUNTER — Encounter (HOSPITAL_BASED_OUTPATIENT_CLINIC_OR_DEPARTMENT_OTHER): Payer: Medicare Other | Admitting: Oncology

## 2010-06-15 ENCOUNTER — Other Ambulatory Visit: Payer: Self-pay | Admitting: Oncology

## 2010-06-15 DIAGNOSIS — R161 Splenomegaly, not elsewhere classified: Secondary | ICD-10-CM

## 2010-06-15 DIAGNOSIS — R609 Edema, unspecified: Secondary | ICD-10-CM

## 2010-06-15 DIAGNOSIS — D696 Thrombocytopenia, unspecified: Secondary | ICD-10-CM

## 2010-06-15 DIAGNOSIS — R630 Anorexia: Secondary | ICD-10-CM

## 2010-06-15 DIAGNOSIS — D61818 Other pancytopenia: Secondary | ICD-10-CM

## 2010-06-15 LAB — CBC WITH DIFFERENTIAL/PLATELET
Eosinophils Absolute: 0.1 10*3/uL (ref 0.0–0.5)
LYMPH%: 19.4 % (ref 14.0–49.0)
MCH: 31.5 pg (ref 27.2–33.4)
MCV: 96 fL (ref 79.3–98.0)
MONO%: 37.7 % — ABNORMAL HIGH (ref 0.0–14.0)
NEUT#: 1.4 10*3/uL — ABNORMAL LOW (ref 1.5–6.5)
Platelets: 51 10*3/uL — ABNORMAL LOW (ref 140–400)
RBC: 3.49 10*6/uL — ABNORMAL LOW (ref 4.20–5.82)
nRBC: 0 % (ref 0–0)

## 2010-06-15 LAB — COMPREHENSIVE METABOLIC PANEL
AST: 57 U/L — ABNORMAL HIGH (ref 0–37)
BUN: 16 mg/dL (ref 6–23)
CO2: 28 mEq/L (ref 19–32)
Calcium: 8.9 mg/dL (ref 8.4–10.5)
Chloride: 103 mEq/L (ref 96–112)
Creatinine, Ser: 0.83 mg/dL (ref 0.40–1.50)

## 2010-06-15 LAB — SEDIMENTATION RATE: Sed Rate: 20 mm/hr — ABNORMAL HIGH (ref 0–16)

## 2010-06-15 NOTE — Miscellaneous (Signed)
Summary: Release to Wife  Release to Wife   Imported By: Florinda Marker 06/11/2010 16:32:49  _____________________________________________________________________  External Attachment:    Type:   Image     Comment:   External Document

## 2010-06-24 NOTE — Consult Note (Signed)
Summary: [facility]  [facility]   Imported By: Florinda Marker 06/15/2010 10:57:26  _____________________________________________________________________  External Attachment:    Type:   Image     Comment:   External Document

## 2010-06-30 LAB — GLUCOSE, CAPILLARY

## 2010-06-30 LAB — CBC
MCH: 31.5 pg (ref 26.0–34.0)
MCV: 96.5 fL (ref 78.0–100.0)
Platelets: 39 10*3/uL — ABNORMAL LOW (ref 150–400)
RBC: 4 MIL/uL — ABNORMAL LOW (ref 4.22–5.81)

## 2010-06-30 LAB — BASIC METABOLIC PANEL
BUN: 17 mg/dL (ref 6–23)
CO2: 28 mEq/L (ref 19–32)
Chloride: 102 mEq/L (ref 96–112)
Creatinine, Ser: 0.88 mg/dL (ref 0.4–1.5)
GFR calc Af Amer: >60 mL/min (ref >60)

## 2010-07-02 LAB — DIFFERENTIAL
Band Neutrophils: 0 % (ref 0–10)
Blasts: 0 %
Eosinophils Absolute: 0 10*3/uL (ref 0.0–0.7)
Eosinophils Relative: 0 % (ref 0–5)
Lymphocytes Relative: 30 % (ref 12–46)
Lymphs Abs: 1.1 10*3/uL (ref 0.7–4.0)
Metamyelocytes Relative: 0 %
Monocytes Absolute: 0.6 10*3/uL (ref 0.1–1.0)
Monocytes Relative: 17 % — ABNORMAL HIGH (ref 3–12)
nRBC: 0 /100 WBC

## 2010-07-02 LAB — CBC
HCT: 35.4 % — ABNORMAL LOW (ref 39.0–52.0)
MCV: 92.9 fL (ref 78.0–100.0)
RBC: 3.82 MIL/uL — ABNORMAL LOW (ref 4.22–5.81)
WBC: 3.6 10*3/uL — ABNORMAL LOW (ref 4.0–10.5)

## 2010-07-06 NOTE — Consult Note (Signed)
Summary: [facility]  [facility]   Imported By: Florinda Marker 06/30/2010 14:08:55  _____________________________________________________________________  External Attachment:    Type:   Image     Comment:   External Document

## 2010-07-19 ENCOUNTER — Other Ambulatory Visit: Payer: Self-pay | Admitting: Oncology

## 2010-07-19 ENCOUNTER — Encounter (HOSPITAL_BASED_OUTPATIENT_CLINIC_OR_DEPARTMENT_OTHER): Payer: Medicare Other | Admitting: Oncology

## 2010-07-19 DIAGNOSIS — R630 Anorexia: Secondary | ICD-10-CM

## 2010-07-19 DIAGNOSIS — R161 Splenomegaly, not elsewhere classified: Secondary | ICD-10-CM

## 2010-07-19 DIAGNOSIS — R609 Edema, unspecified: Secondary | ICD-10-CM

## 2010-07-19 DIAGNOSIS — D61818 Other pancytopenia: Secondary | ICD-10-CM

## 2010-07-19 LAB — COMPREHENSIVE METABOLIC PANEL
AST: 68 U/L — ABNORMAL HIGH (ref 0–37)
Albumin: 3.8 g/dL (ref 3.5–5.2)
BUN: 21 mg/dL (ref 6–23)
CO2: 27 mEq/L (ref 19–32)
Calcium: 8.9 mg/dL (ref 8.4–10.5)
Chloride: 103 mEq/L (ref 96–112)
Creatinine, Ser: 0.8 mg/dL (ref 0.40–1.50)
Glucose, Bld: 114 mg/dL — ABNORMAL HIGH (ref 70–99)
Potassium: 3.8 mEq/L (ref 3.5–5.3)

## 2010-07-19 LAB — CBC WITH DIFFERENTIAL/PLATELET
Basophils Absolute: 0 10*3/uL (ref 0.0–0.1)
Eosinophils Absolute: 0 10*3/uL (ref 0.0–0.5)
HCT: 32.4 % — ABNORMAL LOW (ref 38.4–49.9)
LYMPH%: 29.3 % (ref 14.0–49.0)
MCV: 95.3 fL (ref 79.3–98.0)
MONO#: 1 10*3/uL — ABNORMAL HIGH (ref 0.1–0.9)
MONO%: 26.8 % — ABNORMAL HIGH (ref 0.0–14.0)
NEUT#: 1.6 10*3/uL (ref 1.5–6.5)
NEUT%: 42.5 % (ref 39.0–75.0)
Platelets: 64 10*3/uL — ABNORMAL LOW (ref 140–400)
RBC: 3.4 10*6/uL — ABNORMAL LOW (ref 4.20–5.82)
WBC: 3.7 10*3/uL — ABNORMAL LOW (ref 4.0–10.3)

## 2010-07-19 LAB — TSH: TSH: 6.997 u[IU]/mL — ABNORMAL HIGH (ref 0.350–4.500)

## 2010-07-21 LAB — VITAMIN B12: Vitamin B-12: 458 pg/mL (ref 211–911)

## 2010-07-21 LAB — LACTATE DEHYDROGENASE: LDH: 144 U/L (ref 94–250)

## 2010-08-02 ENCOUNTER — Other Ambulatory Visit: Payer: Self-pay | Admitting: Dermatology

## 2010-08-12 ENCOUNTER — Encounter (HOSPITAL_BASED_OUTPATIENT_CLINIC_OR_DEPARTMENT_OTHER): Payer: Medicare Other | Admitting: Oncology

## 2010-08-12 DIAGNOSIS — R161 Splenomegaly, not elsewhere classified: Secondary | ICD-10-CM

## 2010-08-12 DIAGNOSIS — R630 Anorexia: Secondary | ICD-10-CM

## 2010-08-12 DIAGNOSIS — R5383 Other fatigue: Secondary | ICD-10-CM

## 2010-08-12 DIAGNOSIS — D61818 Other pancytopenia: Secondary | ICD-10-CM

## 2010-08-30 ENCOUNTER — Other Ambulatory Visit: Payer: Self-pay | Admitting: Dermatology

## 2010-09-03 NOTE — H&P (Signed)
NAMETERRACE, FONTANILLA             ACCOUNT NO.:  [id number]   MEDICAL RECORD NO.:  [id number]           PATIENT TYPE:   LOCATION:                                 FACILITY:   PHYSICIAN:  Vesta Mixer, M.D. DATE OF BIRTH:  [birth year]   DATE OF ADMISSION:  DATE OF DISCHARGE:                                HISTORY & PHYSICAL   Nathan Kemp is a middle-aged gentleman who we are asked to see today for  evaluation of some chest pain.   Nathan Kemp has been in relatively good health. He does have a history of  hypertension, hyperlipidemia, and obesity for the past several years. Over  the past summer, he had worsening episodes of chest pressure. He minimized  these episodes of chest pressure and never really told anybody. He just  slowly and gradually decreased his exercise capacity. This past week, he was  out working and had a relatively severe episode of chest pressure. This  occurred with exertion. The discomfort was described as a heavy pressure  right in the middle of his chest. There was no radiation. There was no  associated diaphoresis, nausea, vomiting, syncope, or presyncope. The  episode lasted for several minutes and then resolved when he quit working.  He presents today for further evaluation.   MEDICATIONS:  1.  Benicar/HCT 40/25 mg once a day.  2.  Labetalol 400 mg p.o. b.i.d.  3.  Gemfibrozil 600 mg p.o. b.i.d.  4.  Aspirin 81 mg daily.  5.  Vitamin C 500 mg daily.  6.  Glucosamine 1 a day.  7.  Ambien 5 mg as needed.  8.  Levitra as needed.   ALLERGIES:  CODEINE which causes a rash and CLINDAMYCIN which causes GI  troubles.   PAST MEDICAL HISTORY:  1.  Hypertension.  2.  Dyslipidemia.  3.  Erectile dysfunction.  4.  Mild obesity.   SOCIAL HISTORY:  The patient does not smoke. 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   FAMILY HISTORY:  His father died at age 63 due to diabetes mellitus and  congestive heart failure. His mother died at  age 4 due to an accidental  fall.   REVIEW OF SYSTEMS:  His review of systems was reviewed and essentially  negative.   PHYSICAL EXAMINATION:  GENERAL:  He is a middle-aged gentleman in no acute  distress. He is alert and oriented x3 and his mood and affect are normal.  VITAL SIGNS:  His weight is 245. His blood pressure is 122/72, heart rate is  66.  HEENT:  There are 2+ carotids. He has no bruits. No JVD. No thyromegaly.  LUNGS:  Clear to auscultation.  HEART:  Regular rate. S1 and S2. He has no murmurs, gallops, or rubs.  ABDOMEN:  Mildly obese. He has no areas of tenderness. He has no  hepatosplenomegaly.  EXTREMITIES:  He has no clubbing, cyanosis, or edema. His pulses are intact.  NEUROLOGICAL:  Cranial nerves II through XII intact and his motor and  sensory function are intact.   His EKG reveals normal  sinus rhythm. He has no ST or T-wave changes. There  are no Q-waves. His laboratory data reveals a white blood cell count of 4.4.  His differential is fairly unusual in that he has lots of lymphocytes and  scarcity of granulocytes. His hematocrit is 36.1, platelet count is 136,000.  His pro time is 12.2. His basic metabolic profile reveals mild hypoglycemia  with a glucose of 118. His electrolytes and liver abnormalities are  otherwise unremarkable.   IMPRESSION:  Nathan Kemp presents with symptoms that sound fairly classic  for angina. These have episodes of chest pain and chest discomfort over this  past summer. These episodes have typically always occurred with exertion and  have never occurred with rest. They typically last for several minutes and  then go away completely with rest. Recently, he has had worsening of these  episodes of chest pain. This is somewhat concerning and I think that we  should proceed with a heart catheterization. We have discussed the risks,  benefits, and options of heart catheterization. He understands and agrees to  proceed. We will schedule  an outpatient heart catheterization later this  week or next week. We have discussed the risks, benefits, and options. He  understands and agrees to proceed.           ______________________________  Vesta Mixer, M.D.     PJN/MEDQ  D:  01/26/2005  T:  01/26/2005  Job:  [id number]   cc:   Larina Earthly, M.D.  Fax: [phone]

## 2010-09-03 NOTE — Procedures (Signed)
[facility]  Patient:    Nathan Kemp, Nathan Kemp Visit Number: [id number] MRN: [id number]          Service Type: END Location: ENDO Attending Physician:  Louie Bun Dictated by:   Everardo All Madilyn Fireman, M.D. Proc. Date: 09/17/01 Admit Date:  09/17/2001   CC:         Richard A. Jacky Kindle, M.D.   Procedure Report  PROCEDURE:  Colonoscopy.  INDICATION FOR PROCEDURE:  Screening colonoscopy in this 70 year old patient.  DESCRIPTION OF PROCEDURE:  The patient was placed in the left lateral decubitus position then placed on the pulse monitor with continuous low flow oxygen delivered by nasal cannula. He was sedated with 80 mg IV Demerol and 7 mg IV Versed. The Olympus video colonoscope was inserted into the rectum and advanced to the cecum, confirmed by transillumination at McBurneys point and visualization of the ileocecal valve and appendiceal orifice. The prep was excellent. The cecum, ascending, transverse, descending and sigmoid colon all appeared normal with no masses, polyps, diverticula or other mucosal abnormalities. The rectum likewise appeared normal and retroflexed view of the anus revealed did reveal some small internal hemorrhoids. The colonoscope was then withdrawn and the patient returned to the recovery room in stable condition. The patient tolerated the procedure well and there were no immediate complications.  IMPRESSION:  Small internal hemorrhoids, otherwise, normal colonoscopy.  PLAN:  Flexible sigmoidoscopy in five years. Dictated by:   Everardo All Madilyn Fireman, M.D. Attending Physician:  Louie Bun DD:  09/17/01 TD:  09/18/01 Job: [id number]

## 2010-09-03 NOTE — Op Note (Signed)
NAME:  Nathan Kemp, Nathan Kemp                       ACCOUNT NO.:  [id number]   MEDICAL RECORD NO.:  [id number]                   PATIENT TYPE:  AMB   LOCATION:  DSC                                  FACILITY:  [facility]   PHYSICIAN:  Hermelinda Medicus, M.D.                DATE OF BIRTH:  Apr 10, 1941   DATE OF PROCEDURE:  07/09/2003  DATE OF DISCHARGE:                                 OPERATIVE REPORT   PREOPERATIVE DIAGNOSIS:  Septal deviation, turbinate hypertrophy with nasal  obstruction, history of Afrin use.   POSTOPERATIVE DIAGNOSIS:  Septal deviation, turbinate hypertrophy with nasal  obstruction, history of Afrin use.   OPERATION:  Septal reconstruction, turbinate reduction.   ANESTHESIA:  General endotracheal anesthesia with Dr. Gelene Mink.   DESCRIPTION OF PROCEDURE:  The patient was placed in the supine position  under general endotracheal anesthesia.  The nose was still anesthetized  using 1% Xylocaine with epinephrine and topical cocaine 200 mg to shrink the  membranes.  The inferior turbinates were aggressively outfractured.  No  mucous membrane was removed but we were able to gain considerable space,  especially on the right side, to make some space for the septum to come back  to the midline.  He had also Kemp concha bullosa on the right which was reduced  using the polyp forceps.  No mucous membrane was removed.  Kemp hemitransfixing  incision was made on the right side, carried around the columella to the  left and down the quadrilateral cartilage to the ethmoid septal deviation  portion.  Kemp strip of quadrilateral cartilage was also taken from the floor  of the nose as it was grossly off the premaxillary crest.  Kemp strip was taken  posteriorly and inferiorly and extended back toward the vomerine deviation.  The ethmoid deviation was approached using the open and closed Leane Para-  Middletons and then the vomerine deviation was approached using the 4 mm  chisel.  Once these were removed, the  remainder of the septum was able to be  brought back to the midline.  The inferior turbinates having been reduced,  we gained considerable space on both sides and then closure was begun using  5-0 plain catgut and using through and through septal suture x2 as Kemp blanket  stitch to minimize any swelling.  The patient has also had some  claustrophobia problems, so we are going to try to get his packing out at  least on one side here in the recovery room.  Followup will be today, then  tomorrow, and then in 10 days, 3 weeks, 6 weeks, and 3 months.                                               Hermelinda Medicus, M.D.    JC/MEDQ  D:  07/09/2003  T:  07/10/2003  Job:  [id number]   cc:   Geoffry Paradise, M.D.  [address]  [place]  [place] [address]  Fax: [phone]

## 2010-09-03 NOTE — Cardiovascular Report (Signed)
NAMELEARY, MCNULTY             ACCOUNT NO.:  [id number]   MEDICAL RECORD NO.:  [id number]          PATIENT TYPE:  INP   LOCATION:  6531                         FACILITY:  [facility]   PHYSICIAN:  Vesta Mixer, M.D. DATE OF BIRTH:  [birth year]   DATE OF PROCEDURE:  01/31/2005  DATE OF DISCHARGE:                              CARDIAC CATHETERIZATION   Mr. Buist is a 70 year old gentleman who was seen last week. He had  symptoms consistent with new onset angina. He was scheduled for an  outpatient heart catheterization. Several days later, he had recurrent  episodes of angina requiring nitroglycerin. He was admitted to the hospital  following that. He had very minimally elevated cardiac enzymes with a  troponin 0.4. He was scheduled for inpatient heart catheterization today.   PROCEDURE:  Left heart catheterization with coronary angiography with PTCA  and stenting of his right coronary artery.   The right femoral artery was easily cannulated using a modified Seldinger  technique. Left ventricular pressure is 97/11 with an aortic pressure of  98/61.   ANGIOGRAPHY:  Left main:  Left main is fairly smooth normal.   Left anterior descending artery has minor luminal irregularities. There are  no stenoses greater than 10-20%.   There is a large first diagonal artery that also has only minor luminal  irregularities.   Left circumflex artery is a very large vessel. He gives off several small  obtuse marginal branches. The second obtuse marginal artery has a 70-80%  stenosis. This vessel was probably 1 mm in size and is not a candidate for  angioplasty, and it is quite unlikely that this narrowing is contributing  anything to his episodes of angina.   The circumflex artery terminates at a large posterolateral branch. There are  essentially no stenoses in this large posterolateral branch.   Right coronary artery is large and dominant. It is very dilated in its  midsection. There is  a 99% stenosis at its midpoint. There is TIMI grade  flow through this vessel. The distal right coronary artery bifurcates into a  posterior descending artery and posterolateral branch. Both of these  branches had only minor luminal irregularities.   The left ventriculogram was performed in a 30 RAO position. It reveals  overall normal left ventricular systolic function. Ejection fraction is  around 60-65%.   PERCUTANEOUS INTERVENTION.:  The 6-French sheath was traded out for 7-French  sheath. The patient was given double bolus Integrilin drip as well as 5,700  units of heparin.   The patient also received 600 mg of p.o. Plavix.   The right coronary artery was cannulated using a 7-French Judkins right 4  guide. A BMW wire was positioned in the distal aspect of the right coronary  artery. A 3.5 x 15 mm Quantum Maverick was positioned across the stenosis  and was inflated up to 4 atmospheres for 28 seconds followed by 12  atmospheres for 24 seconds. This resulted in some improvement of the vessel  lumen, but this was clearly undersized.   At this point, a 3.5 x 24 mm Liberte monorail stent was positioned across  the stenosis. It was deployed at 12 atmospheres for 36 seconds. Following  this, a 4.5 mm x 15 mm Quantum monorail was used for post-stent dilatation.  We positioned the balloon distally in the distal aspect of the stent and was  inflated up to 8 atmospheres. It was then positioned just distal to last the  stent strut and was also inflated up to 8 atmospheres at this point for 22  seconds.   This was then pulled back to within the stent and was located at the site of  the original index stenosis. It was inflated up to 10 atmospheres at this  point. Following this, it was then pulled back to the proximal edge, and the  stent was inflated up to 8 atmospheres. The final stent size was 4.3 mm.  Follow-up angiography revealed a nicely opposed stent. There was no evidence  of edge  dissection. He has a TIMI grade 3 flow. There is 0% residual  stenosis.   COMPLICATIONS:  None.   CONCLUSION:  1.  Single-vessel coronary artery disease involving the right coronary      artery.  2.  Successful PTCA and stenting of a very large mid right coronary artery      lesion.   We will anticipate discharge from hospital tomorrow.           ______________________________  Vesta Mixer, M.D.     PJN/MEDQ  D:  01/31/2005  T:  01/31/2005  Job:  [id number]   cc:   Geoffry Paradise, M.D.  Fax: [phone]

## 2010-09-03 NOTE — Discharge Summary (Signed)
NAMEMALAK, ORANTES             ACCOUNT NO.:  [id number]   MEDICAL RECORD NO.:  [id number]          PATIENT TYPE:  INP   LOCATION:  6531                         FACILITY:  [facility]   PHYSICIAN:  Vesta Mixer, M.D. DATE OF BIRTH:  Sep 22, 1940   DATE OF ADMISSION:  01/28/2005  DATE OF DISCHARGE:  02/01/2005                                 DISCHARGE SUMMARY   DISCHARGE DIAGNOSIS:  1.  Coronary artery disease status post PTCA and stenting of the right      coronary artery.  2.  Dyslipidemia.  3.  Hypertension.   DISCHARGE MEDICATIONS:  1.  Aspirin 81 mg a day.  2.  Plavix 75 mg a day.  3.  Labetalol 400 mg p.o. b.i.d.  4.  Benicar/HCT 40/25 mg once a.  5.  Nitroglycerin sublingually as needed.  6.  Crestor 10 mg a day.   DISPOSITION:  The patient will see Dr. Elease Hashimoto in one to two weeks.  He is to  follow up with Dr. Jacky Kindle for his hypercholesterolemia.   HISTORY:  Mr. Collier is a 70 year old gentleman with a recent onset of  unstable angina.  He was admitted to the hospital for episodes of chest  pain.  Please see dictated H&P for further details.   HOSPITAL COURSE:  CORONARY ARTERY DISEASE:  The patient had minimally  elevated cardiac enzymes.  He became pain-free after being started on  Lovenox.  On October 16 he had a heart catheterization which revealed a 99%  stenosis of his mid right coronary artery.  He underwent successful PTCA and  stenting using a 3.5 mm x 24 mm Liberte stent.  The stent was post dilated  using a 4.5 mm balloon with a final stent size of 4.3 mm.  The patient  tolerated the procedure quite well and did not have any chest pains or EKG  changes.  He will be sent home on the above-noted medications.  He will  follow up with Dr. Jacky Kindle for his hypercholesterolemia.  He has been  instructed to continue with a good diet and exercise problem and he will  return to work tomorrow.  All of his other medical problems have remained  stable.     ______________________________  Vesta Mixer, M.D.     PJN/MEDQ  D:  02/01/2005  T:  02/01/2005  Job:  [id number]

## 2010-09-09 ENCOUNTER — Other Ambulatory Visit: Payer: Self-pay | Admitting: Oncology

## 2010-09-09 ENCOUNTER — Encounter (HOSPITAL_BASED_OUTPATIENT_CLINIC_OR_DEPARTMENT_OTHER): Payer: Medicare Other | Admitting: Oncology

## 2010-09-09 DIAGNOSIS — R630 Anorexia: Secondary | ICD-10-CM

## 2010-09-09 DIAGNOSIS — R161 Splenomegaly, not elsewhere classified: Secondary | ICD-10-CM

## 2010-09-09 DIAGNOSIS — R609 Edema, unspecified: Secondary | ICD-10-CM

## 2010-09-09 DIAGNOSIS — D61818 Other pancytopenia: Secondary | ICD-10-CM

## 2010-09-09 LAB — CBC WITH DIFFERENTIAL/PLATELET
BASO%: 0.3 % (ref 0.0–2.0)
EOS%: 1.3 % (ref 0.0–7.0)
HCT: 33.2 % — ABNORMAL LOW (ref 38.4–49.9)
MCH: 31.3 pg (ref 27.2–33.4)
MCHC: 33.1 g/dL (ref 32.0–36.0)
NEUT%: 39.2 % (ref 39.0–75.0)
RBC: 3.51 10*6/uL — ABNORMAL LOW (ref 4.20–5.82)
WBC: 4 10*3/uL (ref 4.0–10.3)
lymph#: 0.7 10*3/uL — ABNORMAL LOW (ref 0.9–3.3)
nRBC: 0 % (ref 0–0)

## 2010-09-09 LAB — COMPREHENSIVE METABOLIC PANEL
ALT: 66 U/L — ABNORMAL HIGH (ref 0–53)
AST: 46 U/L — ABNORMAL HIGH (ref 0–37)
Alkaline Phosphatase: 210 U/L — ABNORMAL HIGH (ref 39–117)
BUN: 19 mg/dL (ref 6–23)
Creatinine, Ser: 0.72 mg/dL (ref 0.40–1.50)
Potassium: 4.1 mEq/L (ref 3.5–5.3)

## 2010-09-10 ENCOUNTER — Other Ambulatory Visit: Payer: Self-pay | Admitting: Oncology

## 2010-10-12 ENCOUNTER — Encounter (HOSPITAL_BASED_OUTPATIENT_CLINIC_OR_DEPARTMENT_OTHER): Payer: Medicare Other | Admitting: Oncology

## 2010-10-12 DIAGNOSIS — D61818 Other pancytopenia: Secondary | ICD-10-CM

## 2010-10-12 DIAGNOSIS — R161 Splenomegaly, not elsewhere classified: Secondary | ICD-10-CM

## 2010-10-12 DIAGNOSIS — R630 Anorexia: Secondary | ICD-10-CM

## 2010-10-12 DIAGNOSIS — R609 Edema, unspecified: Secondary | ICD-10-CM

## 2010-10-22 ENCOUNTER — Encounter (HOSPITAL_BASED_OUTPATIENT_CLINIC_OR_DEPARTMENT_OTHER): Payer: Medicare Other | Admitting: Oncology

## 2010-10-22 ENCOUNTER — Other Ambulatory Visit: Payer: Self-pay | Admitting: Oncology

## 2010-10-22 DIAGNOSIS — R630 Anorexia: Secondary | ICD-10-CM

## 2010-10-22 DIAGNOSIS — R609 Edema, unspecified: Secondary | ICD-10-CM

## 2010-10-22 DIAGNOSIS — R161 Splenomegaly, not elsewhere classified: Secondary | ICD-10-CM

## 2010-10-22 DIAGNOSIS — D61818 Other pancytopenia: Secondary | ICD-10-CM

## 2010-10-22 LAB — CBC WITH DIFFERENTIAL/PLATELET
Basophils Absolute: 0 10*3/uL (ref 0.0–0.1)
Eosinophils Absolute: 0.1 10*3/uL (ref 0.0–0.5)
HCT: 31.1 % — ABNORMAL LOW (ref 38.4–49.9)
LYMPH%: 32 % (ref 14.0–49.0)
MCV: 95.4 fL (ref 79.3–98.0)
MONO#: 1 10*3/uL — ABNORMAL HIGH (ref 0.1–0.9)
NEUT#: 1.7 10*3/uL (ref 1.5–6.5)
NEUT%: 42.4 % (ref 39.0–75.0)
Platelets: 61 10*3/uL — ABNORMAL LOW (ref 140–400)
WBC: 4.1 10*3/uL (ref 4.0–10.3)

## 2010-11-19 ENCOUNTER — Other Ambulatory Visit: Payer: Self-pay | Admitting: Oncology

## 2010-11-19 ENCOUNTER — Encounter (HOSPITAL_BASED_OUTPATIENT_CLINIC_OR_DEPARTMENT_OTHER): Payer: Medicare Other | Admitting: Oncology

## 2010-11-19 DIAGNOSIS — D696 Thrombocytopenia, unspecified: Secondary | ICD-10-CM

## 2010-11-19 DIAGNOSIS — D61818 Other pancytopenia: Secondary | ICD-10-CM

## 2010-11-19 LAB — CBC WITH DIFFERENTIAL/PLATELET
Basophils Absolute: 0 10*3/uL (ref 0.0–0.1)
EOS%: 0.9 % (ref 0.0–7.0)
HCT: 33.4 % — ABNORMAL LOW (ref 38.4–49.9)
HGB: 10.8 g/dL — ABNORMAL LOW (ref 13.0–17.1)
LYMPH%: 17.1 % (ref 14.0–49.0)
MCH: 31.5 pg (ref 27.2–33.4)
MCV: 97.4 fL (ref 79.3–98.0)
MONO%: 43.1 % — ABNORMAL HIGH (ref 0.0–14.0)
NEUT%: 38.4 % — ABNORMAL LOW (ref 39.0–75.0)
Platelets: 64 10*3/uL — ABNORMAL LOW (ref 140–400)
lymph#: 0.7 10*3/uL — ABNORMAL LOW (ref 0.9–3.3)

## 2010-11-19 LAB — TESTOSTERONE: Testosterone: 221.56 ng/dL — ABNORMAL LOW (ref 250–890)

## 2010-11-19 LAB — COMPREHENSIVE METABOLIC PANEL
AST: 51 U/L — ABNORMAL HIGH (ref 0–37)
Albumin: 4.1 g/dL (ref 3.5–5.2)
Alkaline Phosphatase: 197 U/L — ABNORMAL HIGH (ref 39–117)
BUN: 23 mg/dL (ref 6–23)
Creatinine, Ser: 0.87 mg/dL (ref 0.50–1.35)
Glucose, Bld: 113 mg/dL — ABNORMAL HIGH (ref 70–99)
Total Bilirubin: 0.5 mg/dL (ref 0.3–1.2)

## 2010-12-06 ENCOUNTER — Ambulatory Visit: Payer: Medicare Other | Attending: Oncology | Admitting: Physical Therapy

## 2010-12-06 DIAGNOSIS — IMO0001 Reserved for inherently not codable concepts without codable children: Secondary | ICD-10-CM | POA: Insufficient documentation

## 2010-12-06 DIAGNOSIS — I890 Lymphedema, not elsewhere classified: Secondary | ICD-10-CM | POA: Insufficient documentation

## 2010-12-10 ENCOUNTER — Other Ambulatory Visit: Payer: Self-pay | Admitting: Gastroenterology

## 2010-12-27 ENCOUNTER — Ambulatory Visit: Payer: Medicare Other | Attending: Oncology | Admitting: Physical Therapy

## 2010-12-27 DIAGNOSIS — I890 Lymphedema, not elsewhere classified: Secondary | ICD-10-CM | POA: Insufficient documentation

## 2010-12-27 DIAGNOSIS — IMO0001 Reserved for inherently not codable concepts without codable children: Secondary | ICD-10-CM | POA: Insufficient documentation

## 2011-01-03 ENCOUNTER — Ambulatory Visit: Payer: Medicare Other | Admitting: Physical Therapy

## 2011-01-13 ENCOUNTER — Encounter: Payer: Self-pay | Admitting: Cardiovascular Disease

## 2011-01-13 ENCOUNTER — Ambulatory Visit (INDEPENDENT_AMBULATORY_CARE_PROVIDER_SITE_OTHER): Payer: Medicare Other | Admitting: Cardiovascular Disease

## 2011-01-13 VITALS — BP 124/76 | HR 84 | Ht 69.0 in | Wt 202.4 lb

## 2011-01-13 DIAGNOSIS — I2510 Atherosclerotic heart disease of native coronary artery without angina pectoris: Secondary | ICD-10-CM | POA: Insufficient documentation

## 2011-01-13 NOTE — Patient Instructions (Signed)
Return in 1 year ?

## 2011-01-13 NOTE — Progress Notes (Signed)
Nathan Kemp Date of Birth  07-16-1940 [facility] [address]    [address] [place], [place]  [address] [phone]  Fax  [phone]  History of Present Illness:  70 year old gentleman with a history of coronary artery disease. He has a history of PTCA and stenting of his right coronary artery in 2006.  He had a 3.5 x 24 mm Liberte stent placed. We used a 4.5 mm noncompliant balloon for post dilatation.   He's done well from a cardiac standpoint since that time.  He's not had any episodes of chest pain or shortness breath.  I saw him last year for anorexia and weight loss. He also had some thrombocytopenia. He's had a very extensive workup at [facility] and at the [facility]. So far they've not found any clear-cut etiology for his low platelet counts and liver function abnormalities.  He has generally improved since that time. The plan now is to continue to observe but without any further testing until he develops any other symptoms.   Current Outpatient Prescriptions on File Prior to Visit  Medication Sig Dispense Refill  . Levothyroxine Sodium (SYNTHROID PO) Take 1 tablet by mouth daily.        Marland Kitchen olmesartan (BENICAR) 40 MG tablet Take 40 mg by mouth daily.          Allergies  Allergen Reactions  . Clindamycin   . Codeine   . Niaspan (Niacin)     Past Medical History  Diagnosis Date  . Coronary artery disease   . Anorexia   . Thrombocytopenia   . Fatigue   . Hypertension   . Hyperlipidemia   . Vitamin D deficiency   . ED (erectile dysfunction)     Past Surgical History  Procedure Date  . Cardiac catheterization 01/31/2005    EF 60-65%  . Coronary angioplasty with stent placement 2006    STENTING OF HIS CORONARY ARTERY  . Appendectomy   . US echocardiography 02/28/2005    EF 55-60%    History  Smoking status  . Never Smoker   Smokeless tobacco  . Not on file    History  Alcohol Use No    Family History  Problem Relation Age of  Onset  . Diabetes Father   . Heart failure Father     Reviw of Systems:  Reviewed in the HPI.  All other systems are negative.  Physical Exam: BP 124/76  Pulse 84  Ht 5\' 9"  (1.753 m)  Wt 202 lb 6.4 oz (91.808 kg)  BMI 29.89 kg/m2 The patient is alert and oriented x 3.  The mood and affect are normal.   Skin: warm and dry.  Color is normal.    HEENT:   the sclera are nonicteric.  The mucous membranes are moist.  The carotids are 2+ without bruits.  There is no thyromegaly.  There is no JVD.    Lungs: clear.  The chest wall is non tender.    Heart: regular rate with a normal S1 and S2.  There is a soft systolic murmur. The PMI is not displaced.     Abdomen: good bowel sounds.  There is no guarding or rebound.  There is no hepatosplenomegaly or tenderness.  There are no masses.   Extremities:  There  Is trace edema and chronic stasis changes.    The distal pulses are intact.   Neuro:  Cranial nerves II - XII are intact.  Motor and sensory functions are intact.  The gait is normal.  ECG:  Assessment / Plan:

## 2011-01-13 NOTE — Assessment & Plan Note (Signed)
He has a bare metal stent that was placed in 2006. The final stent size is 4.5 mm. He has not had Plavix or aspirin in the past year and has done very well.  His platelet count is around 50,000.  My inclination is I would not necessarily want to start aspirin until his platelet count dropped to around 100 K.  I'll plan on seeing him again in one year. His platelet count has returned to normal I would be inclined to start him on aspirin 81 mg a day. Otherwise I would hesitate to start him on any antiplatelet therapy. I would like to defer this final decision to Dr. Truett Perna.

## 2011-01-24 ENCOUNTER — Encounter: Payer: Medicare Other | Admitting: Physical Therapy

## 2011-01-27 ENCOUNTER — Encounter (HOSPITAL_BASED_OUTPATIENT_CLINIC_OR_DEPARTMENT_OTHER): Payer: Medicare Other | Admitting: Oncology

## 2011-01-27 DIAGNOSIS — D61818 Other pancytopenia: Secondary | ICD-10-CM

## 2011-01-27 DIAGNOSIS — D696 Thrombocytopenia, unspecified: Secondary | ICD-10-CM

## 2011-01-27 DIAGNOSIS — R5381 Other malaise: Secondary | ICD-10-CM

## 2011-02-14 ENCOUNTER — Other Ambulatory Visit: Payer: Self-pay | Admitting: Dermatology

## 2011-02-21 ENCOUNTER — Encounter: Payer: Medicare Other | Admitting: Physical Therapy

## 2011-03-01 ENCOUNTER — Telehealth: Payer: Self-pay | Admitting: *Deleted

## 2011-03-01 NOTE — Telephone Encounter (Signed)
Wife asking for MD to suggest urologist for patient. Having some rusty, brownish discharge from his penis and U/A was negative for UTI. Dr. Truett Perna suggested Dr. Isabel Caprice or Dahlstedt.

## 2011-03-04 ENCOUNTER — Encounter (INDEPENDENT_AMBULATORY_CARE_PROVIDER_SITE_OTHER): Payer: Self-pay | Admitting: Surgery

## 2011-03-08 ENCOUNTER — Encounter (INDEPENDENT_AMBULATORY_CARE_PROVIDER_SITE_OTHER): Payer: Self-pay | Admitting: Surgery

## 2011-03-08 ENCOUNTER — Ambulatory Visit (INDEPENDENT_AMBULATORY_CARE_PROVIDER_SITE_OTHER): Payer: Medicare Other | Admitting: Surgery

## 2011-03-08 DIAGNOSIS — R591 Generalized enlarged lymph nodes: Secondary | ICD-10-CM | POA: Insufficient documentation

## 2011-03-08 DIAGNOSIS — R599 Enlarged lymph nodes, unspecified: Secondary | ICD-10-CM

## 2011-03-08 NOTE — Progress Notes (Signed)
Chief Complaint  Patient presents with  . New Evaluation    Eval lymph node - referral by Dr. Mancel Bale    HISTORY: The patient is a 70 year old white male referred by his oncologist for right anterior cervical lymph node excision for biopsy.  Patient has been undergoing workup for pancytopenia, thrombocytopenia, and fatigue for approximately 2 years. He has undergone previous right inguinal lymph node biopsy which was nondiagnostic. He has had numerous studies at a variety of institutions without definitive diagnosis. Approximately 3 weeks ago he was noted on physical exam by his oncologist have a prominent high right anterior cervical lymph node. This has persisted. No other significant adenopathy has been identified. Patient is now referred for consideration for excisional biopsy.   Past Medical History  Diagnosis Date  . Coronary artery disease   . Anorexia   . Thrombocytopenia   . Fatigue   . Hypertension   . Hyperlipidemia   . Vitamin D deficiency   . ED (erectile dysfunction)   . Diabetes mellitus   . Obesity   . DDD (degenerative disc disease), cervical   . Anemia   . Thyroid disease   . Trouble swallowing   . Cough   . Leg swelling     due to lymphedema     Current Outpatient Prescriptions  Medication Sig Dispense Refill  . azithromycin (ZITHROMAX) 250 MG tablet 250 mg daily.       Marland Kitchen Phenylephrine-DM-GG (TUSSEX PO) Take by mouth daily.        Marland Kitchen albuterol (PROVENTIL HFA;VENTOLIN HFA) 108 (90 BASE) MCG/ACT inhaler Inhale 2 puffs into the lungs every 6 (six) hours as needed.        . diphenhydrAMINE (BENADRYL) 25 MG tablet Take 25 mg by mouth every 6 (six) hours as needed.        Marland Kitchen ibuprofen (ADVIL,MOTRIN) 200 MG tablet Take 200 mg by mouth every 6 (six) hours as needed.        . Levothyroxine Sodium (SYNTHROID PO) Take 1 tablet by mouth daily.        Marland Kitchen loteprednol (LOTEMAX) 0.5 % ophthalmic suspension Place 1 drop into both eyes 2 (two) times daily.        .  mometasone-formoterol (DULERA) 100-5 MCG/ACT AERO Inhale 2 puffs into the lungs 2 (two) times daily.        . Multiple Vitamins-Minerals (ZINC PO) Take by mouth.        . nitroGLYCERIN (NITROSTAT) 0.4 MG SL tablet Place 0.4 mg under the tongue every 5 (five) minutes as needed.        Marland Kitchen olmesartan (BENICAR) 40 MG tablet Take 40 mg by mouth daily.        Marland Kitchen olmesartan-hydrochlorothiazide (BENICAR HCT) 40-25 MG per tablet Take 1 tablet by mouth daily.        Bertram Gala Glycol-Propyl Glycol (SYSTANE) 0.4-0.3 % SOLN Apply to eye.        . testosterone (ANDROGEL) 50 MG/5GM GEL Place 20.25 g onto the skin daily.        . vardenafil (LEVITRA) 20 MG tablet Take 20 mg by mouth daily as needed.        . zinc gluconate 50 MG tablet Take 50 mg by mouth daily.        Marland Kitchen zolpidem (AMBIEN) 10 MG tablet Take 10 mg by mouth at bedtime as needed.           Allergies  Allergen Reactions  . Codeine   . Niaspan (  Niacin)   . Clindamycin Nausea Only and Other (See Comments)    Stomach cramps     Family History  Problem Relation Age of Onset  . Heart failure Father   . Hypertension Father   . Hearing loss Father 35    congestive heart failure  . Hypertension Mother   . Stroke Mother 77     History   Social History  . Marital Status: Married    Spouse Name: N/A    Number of Children: N/A  . Years of Education: N/A   Social History Main Topics  . Smoking status: Never Smoker   . Smokeless tobacco: Never Used  . Alcohol Use: No  . Drug Use: No  . Sexually Active: None   Other Topics Concern  . None   Social History Narrative  . None     REVIEW OF SYSTEMS - PERTINENT POSITIVES ONLY: Mild tenderness with manipulation, otherwise no significant pain   EXAM: Filed Vitals:   03/08/11 1323  BP: 128/82  Pulse: 80  Temp: 97.6 F (36.4 C)  Resp: 16    HEENT: normocephalic; pupils equal and reactive; sclerae clear; dentition good; mucous membranes moist NECK:  The palpable high right  anterior cervical lymph node, approximately 3 cm in diameter, mobile, mildly tender to palpation; asymmetric on extension; no palpable posterior cervical lymphadenopathy; no supraclavicular masses; no tenderness CHEST: clear to auscultation bilaterally without rales, rhonchi, or wheezes CARDIAC: regular rate and rhythm without significant murmur; peripheral pulses are full EXT:  non-tender with mild edema; no deformity NEURO: no gross focal deficits; no sign of tremor   LABORATORY RESULTS: See E-Chart for most recent results   RADIOLOGY RESULTS: See E-Chart or I-Site for most recent results   IMPRESSION: #1 right anterior cervical lymphadenopathy #2 thrombocytopenia   PLAN: I discussed with the patient and his wife the indications for excisional biopsy. We discussed potential complications including lymphocele formation, nerve injury with areas of anesthesia or facial paralysis, significant bleeding due to major vascular injury, and infection.  We discussed the size and location of the surgical incision.  I would prefer to perform this procedure in the main operating room at the hospital on an outpatient basis. Patient and his wife agree. We will schedule his surgery in the near future and submit this lymph node to pathology for histologic evaluation and flow cytometry.  The risks and benefits of the procedure have been discussed at length with the patient.  The patient understands the proposed procedure, potential alternative treatments, and the course of recovery to be expected.  All of the patient's questions have been answered at this time.  The patient wishes to proceed with surgery and will schedule a date for their procedure through our office staff.   [name], MD, FACS General & Endocrine Surgery [facility]    Visit Diagnoses: 1. Lymphadenopathy, neck     Primary Care Physician: Minda Meo, MD, MD  Oncology: Dr. Mancel Bale

## 2011-03-09 ENCOUNTER — Telehealth: Payer: Self-pay | Admitting: Cardiovascular Disease

## 2011-03-09 ENCOUNTER — Telehealth: Payer: Self-pay | Admitting: *Deleted

## 2011-03-09 ENCOUNTER — Encounter (HOSPITAL_COMMUNITY): Payer: Self-pay | Admitting: Pharmacy Technician

## 2011-03-09 NOTE — Telephone Encounter (Signed)
LOV,Chest faxed to [facility] @ [phone]  03/09/11/km

## 2011-03-09 NOTE — Telephone Encounter (Signed)
Wife called to report neck node biopsy will be on 11/27 at [facility] by Dr. Gerrit Friends.

## 2011-03-14 ENCOUNTER — Encounter (HOSPITAL_COMMUNITY)
Admission: RE | Admit: 2011-03-14 | Discharge: 2011-03-14 | Disposition: A | Discharge status: Home / Self Care | Payer: Medicare Other | Source: Ambulatory Visit | Attending: Surgery | Admitting: Surgery

## 2011-03-14 ENCOUNTER — Encounter (HOSPITAL_COMMUNITY): Payer: Self-pay

## 2011-03-14 HISTORY — DX: Hypothyroidism, unspecified: E03.9

## 2011-03-14 HISTORY — DX: Disease of blood and blood-forming organs, unspecified: D75.9

## 2011-03-14 LAB — BASIC METABOLIC PANEL
Chloride: 103 mEq/L (ref 96–112)
GFR calc non Af Amer: 89 mL/min — ABNORMAL LOW (ref >90)
Glucose, Bld: 101 mg/dL — ABNORMAL HIGH (ref 70–99)
Potassium: 3.9 mEq/L (ref 3.5–5.1)
Sodium: 138 mEq/L (ref 135–145)

## 2011-03-14 LAB — URINALYSIS, ROUTINE W REFLEX MICROSCOPIC
Hgb urine dipstick: NEGATIVE
Nitrite: NEGATIVE
Specific Gravity, Urine: 1.022 (ref 1.005–1.030)
Urobilinogen, UA: 0.2 mg/dL (ref 0.0–1.0)

## 2011-03-14 LAB — CBC
Hemoglobin: 12.3 g/dL — ABNORMAL LOW (ref 13.0–17.0)
MCH: 31.6 pg (ref 26.0–34.0)
RBC: 3.89 MIL/uL — ABNORMAL LOW (ref 4.22–5.81)

## 2011-03-14 LAB — SURGICAL PCR SCREEN
MRSA, PCR: NEGATIVE
Staphylococcus aureus: NEGATIVE

## 2011-03-14 LAB — PROTIME-INR
INR: 0.92 (ref 0.00–1.49)
Prothrombin Time: 12.6 seconds (ref 11.6–15.2)

## 2011-03-14 NOTE — Patient Instructions (Addendum)
20 Bertin DACE DENN  03/14/2011   Your procedure is scheduled on:  Tues. 03/15/2011  Report to [facility] at 0530 AM.  Call this number if you have problems the morning of surgery: [phone]   Remember:Pre-admission #[id number] Meriam Sprague   Do not eat food:After Midnight.  May have clear liquids:until Midnight .  Clear liquids include soda, tea, black coffee, apple or grape juice, broth.  Take these medicines the morning of surgery with A SIP OF WATER: Synthroid, use inhalers if needed and bring with you to hospital, and use eye drops    Do not wear jewelry, make-up or nail polish.  Do not wear lotions, powders, or perfumes.   Do not shave 48 hours prior to surgery.(women only)  Do not bring valuables to the hospital.  Contacts, dentures or bridgework may not be worn into surgery.  Leave suitcase in the car. After surgery it may be brought to your room.  For patients admitted to the hospital, checkout time is 11:00 AM the day of discharge.   Patients discharged the day of surgery will not be allowed to drive home.  Name and phone number of your driver: [phone]  Special Instructions: CHG Shower Use Special Wash: 1/2 bottle night before surgery and 1/2 bottle morning of surgery.   Please read over the following fact sheets that you were given: MRSA Information

## 2011-03-15 ENCOUNTER — Encounter (HOSPITAL_COMMUNITY): Payer: Self-pay

## 2011-03-15 ENCOUNTER — Encounter (HOSPITAL_COMMUNITY): Payer: Self-pay | Admitting: Anesthesiology

## 2011-03-15 ENCOUNTER — Ambulatory Visit (HOSPITAL_COMMUNITY): Payer: Medicare Other | Admitting: Anesthesiology

## 2011-03-15 ENCOUNTER — Encounter (HOSPITAL_COMMUNITY)
Admission: RE | Disposition: A | Discharge status: Home / Self Care | Payer: Self-pay | Source: Ambulatory Visit | Attending: Surgery

## 2011-03-15 ENCOUNTER — Ambulatory Visit (HOSPITAL_COMMUNITY)
Admission: RE | Admit: 2011-03-15 | Discharge: 2011-03-15 | Disposition: A | Discharge status: Home / Self Care | Payer: Medicare Other | Source: Ambulatory Visit | Attending: Surgery | Admitting: Surgery

## 2011-03-15 ENCOUNTER — Other Ambulatory Visit (INDEPENDENT_AMBULATORY_CARE_PROVIDER_SITE_OTHER): Payer: Self-pay | Admitting: Surgery

## 2011-03-15 DIAGNOSIS — R599 Enlarged lymph nodes, unspecified: Secondary | ICD-10-CM | POA: Insufficient documentation

## 2011-03-15 DIAGNOSIS — D61818 Other pancytopenia: Secondary | ICD-10-CM | POA: Insufficient documentation

## 2011-03-15 DIAGNOSIS — I2510 Atherosclerotic heart disease of native coronary artery without angina pectoris: Secondary | ICD-10-CM | POA: Insufficient documentation

## 2011-03-15 DIAGNOSIS — D696 Thrombocytopenia, unspecified: Secondary | ICD-10-CM | POA: Insufficient documentation

## 2011-03-15 DIAGNOSIS — D487 Neoplasm of uncertain behavior of other specified sites: Secondary | ICD-10-CM

## 2011-03-15 DIAGNOSIS — Z79899 Other long term (current) drug therapy: Secondary | ICD-10-CM | POA: Insufficient documentation

## 2011-03-15 DIAGNOSIS — Z01812 Encounter for preprocedural laboratory examination: Secondary | ICD-10-CM | POA: Insufficient documentation

## 2011-03-15 DIAGNOSIS — I10 Essential (primary) hypertension: Secondary | ICD-10-CM | POA: Insufficient documentation

## 2011-03-15 DIAGNOSIS — R591 Generalized enlarged lymph nodes: Secondary | ICD-10-CM

## 2011-03-15 DIAGNOSIS — R5381 Other malaise: Secondary | ICD-10-CM | POA: Insufficient documentation

## 2011-03-15 DIAGNOSIS — E785 Hyperlipidemia, unspecified: Secondary | ICD-10-CM | POA: Insufficient documentation

## 2011-03-15 HISTORY — PX: LYMPH NODE BIOPSY: SHX201

## 2011-03-15 SURGERY — LYMPH NODE BIOPSY
Anesthesia: General | Site: Neck | Laterality: Right | Wound class: Clean

## 2011-03-15 MED ORDER — SUCCINYLCHOLINE CHLORIDE 20 MG/ML IJ SOLN
INTRAMUSCULAR | Status: DC | PRN
  Administered 2011-03-15: 100 mg via INTRAVENOUS

## 2011-03-15 MED ORDER — SODIUM CHLORIDE 0.9 % IR SOLN
Status: DC | PRN
  Administered 2011-03-15: 1000 mL

## 2011-03-15 MED ORDER — BUPIVACAINE HCL (PF) 0.5 % IJ SOLN
INTRAMUSCULAR | Status: DC | PRN
  Administered 2011-03-15: 7 mL

## 2011-03-15 MED ORDER — GLYCOPYRROLATE 0.2 MG/ML IJ SOLN
INTRAMUSCULAR | Status: DC | PRN
  Administered 2011-03-15: .6 mg via INTRAVENOUS

## 2011-03-15 MED ORDER — EPHEDRINE SULFATE 50 MG/ML IJ SOLN
INTRAMUSCULAR | Status: DC | PRN
  Administered 2011-03-15: 10 mg via INTRAVENOUS

## 2011-03-15 MED ORDER — LACTATED RINGERS IV SOLN: INTRAVENOUS | Status: DC

## 2011-03-15 MED ORDER — FENTANYL CITRATE 0.05 MG/ML IJ SOLN
INTRAMUSCULAR | Status: AC
  Filled 2011-03-15: qty 2

## 2011-03-15 MED ORDER — CISATRACURIUM BESYLATE 2 MG/ML IV SOLN
INTRAVENOUS | Status: DC | PRN
  Administered 2011-03-15: 1 mg via INTRAVENOUS
  Administered 2011-03-15: 2 mg via INTRAVENOUS
  Administered 2011-03-15: 5 mg via INTRAVENOUS

## 2011-03-15 MED ORDER — LACTATED RINGERS IV SOLN
INTRAVENOUS | Status: DC | PRN
  Administered 2011-03-15 (×2): via INTRAVENOUS

## 2011-03-15 MED ORDER — PROPOFOL 10 MG/ML IV EMUL
INTRAVENOUS | Status: DC | PRN
  Administered 2011-03-15: 160 mg via INTRAVENOUS
  Administered 2011-03-15: 40 mg via INTRAVENOUS

## 2011-03-15 MED ORDER — HYDROCODONE-ACETAMINOPHEN 10-325 MG PO TABS: 1.0000 | ORAL_TABLET | ORAL | Status: AC | PRN

## 2011-03-15 MED ORDER — FENTANYL CITRATE 0.05 MG/ML IJ SOLN
INTRAMUSCULAR | Status: DC | PRN
  Administered 2011-03-15: 50 ug via INTRAVENOUS
  Administered 2011-03-15: 100 ug via INTRAVENOUS

## 2011-03-15 MED ORDER — HYDROCODONE-ACETAMINOPHEN 10-325 MG PO TABS
ORAL_TABLET | ORAL | Status: AC
  Administered 2011-03-15: 1 via ORAL
  Filled 2011-03-15: qty 1

## 2011-03-15 MED ORDER — MIDAZOLAM HCL 5 MG/5ML IJ SOLN
INTRAMUSCULAR | Status: DC | PRN
  Administered 2011-03-15: 2 mg via INTRAVENOUS

## 2011-03-15 MED ORDER — CEFAZOLIN SODIUM-DEXTROSE 2-3 GM-% IV SOLR
2.0000 g | Freq: Once | INTRAVENOUS | Status: AC
  Administered 2011-03-15: 2 g via INTRAVENOUS

## 2011-03-15 MED ORDER — ONDANSETRON HCL 4 MG/2ML IJ SOLN
INTRAMUSCULAR | Status: DC | PRN
  Administered 2011-03-15: 4 mg via INTRAVENOUS

## 2011-03-15 MED ORDER — FENTANYL CITRATE 0.05 MG/ML IJ SOLN
25.0000 ug | INTRAMUSCULAR | Status: DC | PRN
  Administered 2011-03-15 (×3): 50 ug via INTRAVENOUS

## 2011-03-15 MED ORDER — FENTANYL CITRATE 0.05 MG/ML IJ SOLN
INTRAMUSCULAR | Status: AC
  Administered 2011-03-15: 50 ug via INTRAVENOUS
  Filled 2011-03-15: qty 2

## 2011-03-15 MED ORDER — BUPIVACAINE-EPINEPHRINE (PF) 0.5% -1:200000 IJ SOLN
INTRAMUSCULAR | Status: AC
  Filled 2011-03-15: qty 10

## 2011-03-15 MED ORDER — BUPIVACAINE HCL (PF) 0.5 % IJ SOLN
INTRAMUSCULAR | Status: AC
  Filled 2011-03-15: qty 30

## 2011-03-15 MED ORDER — NEOSTIGMINE METHYLSULFATE 1 MG/ML IJ SOLN
INTRAMUSCULAR | Status: DC | PRN
  Administered 2011-03-15: 4 mg via INTRAVENOUS

## 2011-03-15 MED ORDER — CEFAZOLIN SODIUM 1-5 GM-% IV SOLN
INTRAVENOUS | Status: AC
  Filled 2011-03-15: qty 100

## 2011-03-15 SURGICAL SUPPLY — 38 items
APL SKNCLS STERI-STRIP NONHPOA (GAUZE/BANDAGES/DRESSINGS) ×1
APPLICATOR COTTON TIP 6IN STRL (MISCELLANEOUS) ×1 IMPLANT
BENZOIN TINCTURE PRP APPL 2/3 (GAUZE/BANDAGES/DRESSINGS) ×2 IMPLANT
BLADE HEX COATED 2.75 (ELECTRODE) ×2 IMPLANT
BLADE SURG 15 STRL LF DISP TIS (BLADE) ×1 IMPLANT
BLADE SURG 15 STRL SS (BLADE) ×2
CANISTER SUCTION 2500CC (MISCELLANEOUS) ×2 IMPLANT
CLIP TI MEDIUM 6 (CLIP) ×1 IMPLANT
CLIP TI WIDE RED SMALL 6 (CLIP) ×2 IMPLANT
CLOSURE STERI STRIP 1/2 X4 (GAUZE/BANDAGES/DRESSINGS) ×1 IMPLANT
CLOTH BEACON ORANGE TIMEOUT ST (SAFETY) ×2 IMPLANT
DECANTER SPIKE VIAL GLASS SM (MISCELLANEOUS) ×2 IMPLANT
DRAPE LAPAROTOMY TRNSV 102X78 (DRAPE) ×2 IMPLANT
ELECT REM PT RETURN 9FT ADLT (ELECTROSURGICAL) ×2
ELECTRODE REM PT RTRN 9FT ADLT (ELECTROSURGICAL) ×1 IMPLANT
GAUZE SPONGE 4X4 12PLY STRL LF (GAUZE/BANDAGES/DRESSINGS) ×1 IMPLANT
GLOVE BIOGEL PI IND STRL 7.0 (GLOVE) ×1 IMPLANT
GLOVE BIOGEL PI INDICATOR 7.0 (GLOVE) ×1
GLOVE SURG ORTHO 8.0 STRL STRW (GLOVE) ×2 IMPLANT
GOWN STRL NON-REIN LRG LVL3 (GOWN DISPOSABLE) ×2 IMPLANT
GOWN STRL REIN XL XLG (GOWN DISPOSABLE) ×4 IMPLANT
KIT BASIN OR (CUSTOM PROCEDURE TRAY) ×2 IMPLANT
NDL HYPO 25X1 1.5 SAFETY (NEEDLE) ×1 IMPLANT
NEEDLE HYPO 25X1 1.5 SAFETY (NEEDLE) ×2 IMPLANT
NS IRRIG 1000ML POUR BTL (IV SOLUTION) ×2 IMPLANT
PACK BASIC VI WITH GOWN DISP (CUSTOM PROCEDURE TRAY) ×2 IMPLANT
PENCIL BUTTON HOLSTER BLD 10FT (ELECTRODE) ×2 IMPLANT
SPONGE GAUZE 4X4 12PLY (GAUZE/BANDAGES/DRESSINGS) ×2 IMPLANT
SPONGE LAP 4X18 X RAY DECT (DISPOSABLE) ×2 IMPLANT
STRIP CLOSURE SKIN 1/2X4 (GAUZE/BANDAGES/DRESSINGS) ×2 IMPLANT
SUT SILK 2 0 SH (SUTURE) IMPLANT
SUT VIC AB 3-0 SH 18 (SUTURE) ×2 IMPLANT
SUT VIC AB 4-0 PS2 27 (SUTURE) ×2 IMPLANT
SYR BULB IRRIGATION 50ML (SYRINGE) ×2 IMPLANT
SYR CONTROL 10ML LL (SYRINGE) ×2 IMPLANT
TAPE CLOTH SURG 4X10 WHT LF (GAUZE/BANDAGES/DRESSINGS) ×1 IMPLANT
TOWEL OR 17X26 10 PK STRL BLUE (TOWEL DISPOSABLE) ×6 IMPLANT
YANKAUER SUCT BULB TIP 10FT TU (MISCELLANEOUS) IMPLANT

## 2011-03-15 NOTE — Transfer of Care (Signed)
Immediate Anesthesia Transfer of Care Note  Patient: Nathan Kemp  Procedure(s) Performed:  LYMPH NODE BIOPSY - Right Anterior Cervical Lymph Node Excisional Biopsy   Patient Location: PACU  Anesthesia Type: General  Level of Consciousness: awake and oriented  Airway & Oxygen Therapy: Patient Spontanous Breathing and Patient connected to face mask oxygen  Post-op Assessment: Report given to PACU RN and Post -op Vital signs reviewed and stable  Post vital signs: Reviewed and stable  Complications: No apparent anesthesia complications

## 2011-03-15 NOTE — Anesthesia Postprocedure Evaluation (Signed)
  Anesthesia Post-op Note  Patient: Nathan Kemp  Procedure(s) Performed:  LYMPH NODE BIOPSY - Right Anterior Cervical Lymph Node Excisional Biopsy   Patient Location: PACU  Anesthesia Type: General  Level of Consciousness: awake and alert   Airway and Oxygen Therapy: Patient Spontanous Breathing  Post-op Pain: mild  Post-op Assessment: Post-op Vital signs reviewed, Patient's Cardiovascular Status Stable, Respiratory Function Stable, Patent Airway and No signs of Nausea or vomiting  Post-op Vital Signs: stable  Complications: No apparent anesthesia complications

## 2011-03-15 NOTE — Addendum Note (Signed)
Addendum  created 03/15/11 1005 by Darci Needle. Nathan Kemp   Modules edited:Anesthesia Medication Administration

## 2011-03-15 NOTE — Preoperative (Signed)
Beta Blockers   Reason not to administer Beta Blockers:Not Applicable 

## 2011-03-15 NOTE — Addendum Note (Signed)
Addendum  created 03/15/11 1005 by Aundreya Souffrant L. Mikahla Wisor   Modules edited:Anesthesia Medication Administration    

## 2011-03-15 NOTE — H&P (View-Only) (Signed)
Chief Complaint  Patient presents with  . New Evaluation    Eval lymph node - referral by Dr. Brad Sherrill    HISTORY: The patient is a 70-year-old white male referred by his oncologist for right anterior cervical lymph node excision for biopsy.  Patient has been undergoing workup for pancytopenia, thrombocytopenia, and fatigue for approximately 2 years. He has undergone previous right inguinal lymph node biopsy which was nondiagnostic. He has had numerous studies at a variety of institutions without definitive diagnosis. Approximately 3 weeks ago he was noted on physical exam by his oncologist have a prominent high right anterior cervical lymph node. This has persisted. No other significant adenopathy has been identified. Patient is now referred for consideration for excisional biopsy.   Past Medical History  Diagnosis Date  . Coronary artery disease   . Anorexia   . Thrombocytopenia   . Fatigue   . Hypertension   . Hyperlipidemia   . Vitamin D deficiency   . ED (erectile dysfunction)   . Diabetes mellitus   . Obesity   . DDD (degenerative disc disease), cervical   . Anemia   . Thyroid disease   . Trouble swallowing   . Cough   . Leg swelling     due to lymphedema     Current Outpatient Prescriptions  Medication Sig Dispense Refill  . azithromycin (ZITHROMAX) 250 MG tablet 250 mg daily.       . Phenylephrine-DM-GG (TUSSEX PO) Take by mouth daily.        . albuterol (PROVENTIL HFA;VENTOLIN HFA) 108 (90 BASE) MCG/ACT inhaler Inhale 2 puffs into the lungs every 6 (six) hours as needed.        . diphenhydrAMINE (BENADRYL) 25 MG tablet Take 25 mg by mouth every 6 (six) hours as needed.        . ibuprofen (ADVIL,MOTRIN) 200 MG tablet Take 200 mg by mouth every 6 (six) hours as needed.        . Levothyroxine Sodium (SYNTHROID PO) Take 1 tablet by mouth daily.        . loteprednol (LOTEMAX) 0.5 % ophthalmic suspension Place 1 drop into both eyes 2 (two) times daily.        .  mometasone-formoterol (DULERA) 100-5 MCG/ACT AERO Inhale 2 puffs into the lungs 2 (two) times daily.        . Multiple Vitamins-Minerals (ZINC PO) Take by mouth.        . nitroGLYCERIN (NITROSTAT) 0.4 MG SL tablet Place 0.4 mg under the tongue every 5 (five) minutes as needed.        . olmesartan (BENICAR) 40 MG tablet Take 40 mg by mouth daily.        . olmesartan-hydrochlorothiazide (BENICAR HCT) 40-25 MG per tablet Take 1 tablet by mouth daily.        . Polyethyl Glycol-Propyl Glycol (SYSTANE) 0.4-0.3 % SOLN Apply to eye.        . testosterone (ANDROGEL) 50 MG/5GM GEL Place 20.25 g onto the skin daily.        . vardenafil (LEVITRA) 20 MG tablet Take 20 mg by mouth daily as needed.        . zinc gluconate 50 MG tablet Take 50 mg by mouth daily.        . zolpidem (AMBIEN) 10 MG tablet Take 10 mg by mouth at bedtime as needed.           Allergies  Allergen Reactions  . Codeine   . Niaspan (  Niacin)   . Clindamycin Nausea Only and Other (See Comments)    Stomach cramps     Family History  Problem Relation Age of Onset  . Heart failure Father   . Hypertension Father   . Hearing loss Father 74    congestive heart failure  . Hypertension Mother   . Stroke Mother 74     History   Social History  . Marital Status: Married    Spouse Name: N/A    Number of Children: N/A  . Years of Education: N/A   Social History Main Topics  . Smoking status: Never Smoker   . Smokeless tobacco: Never Used  . Alcohol Use: No  . Drug Use: No  . Sexually Active: None   Other Topics Concern  . None   Social History Narrative  . None     REVIEW OF SYSTEMS - PERTINENT POSITIVES ONLY: Mild tenderness with manipulation, otherwise no significant pain   EXAM: Filed Vitals:   03/08/11 1323  BP: 128/82  Pulse: 80  Temp: 97.6 F (36.4 C)  Resp: 16    HEENT: normocephalic; pupils equal and reactive; sclerae clear; dentition good; mucous membranes moist NECK:  The palpable high right  anterior cervical lymph node, approximately 3 cm in diameter, mobile, mildly tender to palpation; asymmetric on extension; no palpable posterior cervical lymphadenopathy; no supraclavicular masses; no tenderness CHEST: clear to auscultation bilaterally without rales, rhonchi, or wheezes CARDIAC: regular rate and rhythm without significant murmur; peripheral pulses are full EXT:  non-tender with mild edema; no deformity NEURO: no gross focal deficits; no sign of tremor   LABORATORY RESULTS: See E-Chart for most recent results   RADIOLOGY RESULTS: See E-Chart or I-Site for most recent results   IMPRESSION: #1 right anterior cervical lymphadenopathy #2 thrombocytopenia   PLAN: I discussed with the patient and his wife the indications for excisional biopsy. We discussed potential complications including lymphocele formation, nerve injury with areas of anesthesia or facial paralysis, significant bleeding due to major vascular injury, and infection.  We discussed the size and location of the surgical incision.  I would prefer to perform this procedure in the main operating room at the hospital on an outpatient basis. Patient and his wife agree. We will schedule his surgery in the near future and submit this lymph node to pathology for histologic evaluation and flow cytometry.  The risks and benefits of the procedure have been discussed at length with the patient.  The patient understands the proposed procedure, potential alternative treatments, and the course of recovery to be expected.  All of the patient's questions have been answered at this time.  The patient wishes to proceed with surgery and will schedule a date for their procedure through our office staff.   Lusine Corlett M. Dhruvan Gullion, MD, FACS General & Endocrine Surgery [facility]    Visit Diagnoses: 1. Lymphadenopathy, neck     Primary Care Physician: ARONSON,RICHARD A, MD, MD  Oncology: Dr. Brad Sherrill 

## 2011-03-15 NOTE — Op Note (Signed)
NAMESARKIS, RHINES             ACCOUNT NO.:  [id number]  MEDICAL RECORD NO.:  [id number]  LOCATION:  [facility]                         FACILITY:  [facility]  PHYSICIAN:  [name], MD      DATE OF BIRTH:  11/01/1940  DATE OF PROCEDURE:  03/15/2011 DATE OF DISCHARGE:  03/15/2011                              OPERATIVE REPORT   PREOPERATIVE DIAGNOSIS:  Right anterior cervical lymphadenopathy, rule out lymphoma.  POSTOPERATIVE DIAGNOSIS:  Right anterior cervical lymphadenopathy, rule out lymphoma.  PROCEDURE:  Right anterior cervical excisional lymph node biopsy.  SURGEON:  [name], MD  ANESTHESIA:  General.  ESTIMATED BLOOD LOSS:  Minimal.  PREPARATION:  ChloraPrep.  COMPLICATIONS:  None.  INDICATIONS:  The patient is a 70 year old white male with a complex past medical history.  He is referred by Dr. Mancel Bale for newly identified right anterior cervical lymphadenopathy.  The patient has had an extensive workup over the past 2 years for pancytopenia, thrombocytopenia, and fatigue.  He has had a previous inguinal lymph node biopsy without definitive diagnosis.  The patient now comes to surgery for excision of right anterior cervical lymph nodes for suspected lymphoma.  Procedures done in OR #11 at the [facility].  The patient was brought to the operating room, placed in the supine position on the operating room table.  Following administration of general anesthesia, the patient's head was turned to the left and he is positioned and padded in the usual fashion.  The patient was then prepped and draped.  A time-out was performed.  After ascertaining that an adequate level of anesthesia been achieved, a right neck incision was made with a #15 blade.  Dissection was carried through subcutaneous tissues and hemostasis obtained with the electrocautery.  We planned retractors placed for exposure.  Skin flaps were developed circumferentially.   Incision is made at the anterior border of the sternocleidomastoid muscle.  Dissection was carried deeply identifying the area of lymphadenopathy.  Care was taken to gently dissect out the lymph nodes on the capsule.  Lymphatics are divided between small and medium Ligaclips.  Vascular structures were divided between small and medium Ligaclips.  Hypoglossal nerve is avoided. Internal jugular vein is identified and the mass of lymph nodes is excised off the jugular vein without injury.  Good hemostasis was maintained.  A large complex of what appears to be 2 lymph nodes fused together, measuring approximately 4 x 2 x 1.5 cm is excised.  It is submitted in its entirety to pathology for histologic and flow cytometry evaluation.  Good hemostasis was noted throughout the wound.  Sternocleidomastoid muscle was closed with interrupted 3-0 Vicryl sutures.  Subcutaneous tissues were closed with interrupted 3-0 Vicryl sutures.  Skin was anesthetized with local anesthetic.  Skin was closed with a running 4-0 Monocryl subcuticular suture.  Wound was washed and dried and benzoin and Steri-Strips were applied.  Sterile dressing was applied.  The patient was awakened from anesthesia and brought to the recovery room. The patient tolerated the procedure well.     [name], MD     TMG/MEDQ  D:  03/15/2011  T:  03/15/2011  Job:  [id number]  cc:   Ladene Artist, M.D. Fax: [phone]

## 2011-03-15 NOTE — Interval H&P Note (Signed)
History and Physical Interval Note:   03/15/2011   7:21 AM   Nathan Kemp  has presented today for surgery, with the diagnosis of lymphadenopathy  The various methods of treatment have been discussed with the patient and family. After consideration of risks, benefits and other options for treatment, the patient has consented to  Procedure(s): LYMPH NODE BIOPSY as a surgical intervention .  The patients' history has been reviewed, patient examined, no change in status, stable for surgery.  I have reviewed the patients' chart and labs.  Questions were answered to the patient's satisfaction.    [name], MD, FACS General & Endocrine Surgery [facility]   [name]  MD

## 2011-03-15 NOTE — Anesthesia Procedure Notes (Signed)
Procedure Name: Intubation Date/Time: 03/15/2011 7:32 AM Performed by: Lurlean Leyden, Bleu Minerd L. Patient Re-evaluated:Patient Re-evaluated prior to inductionOxygen Delivery Method: Circle System Utilized Preoxygenation: Pre-oxygenation with 100% oxygen Intubation Type: IV induction Ventilation: Mask ventilation without difficulty and Oral airway inserted - appropriate to patient size Laryngoscope Size: Miller and 3 Grade View: Grade II Tube type: Oral Tube size: 8.0 mm Number of attempts: 1 Airway Equipment and Method: stylet Placement Confirmation: ETT inserted through vocal cords under direct vision,  breath sounds checked- equal and bilateral and positive ETCO2 Secured at: 22 cm Tube secured with: Tape Dental Injury: Teeth and Oropharynx as per pre-operative assessment  Comments: LTA utilized

## 2011-03-15 NOTE — Brief Op Note (Addendum)
03/15/2011  8:56 AM  PATIENT:  Nathan Kemp  70 y.o. male  PRE-OPERATIVE DIAGNOSIS:  lymphadenopathy  POST-OPERATIVE DIAGNOSIS:  lymphadenopathy  PROCEDURE:  Procedure(s): LYMPH NODE BIOPSY  SURGEON:  Surgeon(s): [name], MD  PHYSICIAN ASSISTANT:   ASSISTANTS: none   ANESTHESIA:   general  EBL:  Total I/O In: 1000 [I.V.:1000] Out: -   BLOOD ADMINISTERED:none  DRAINS: none   LOCAL MEDICATIONS USED:  MARCAINE 7 CC  SPECIMEN:  Excision  DISPOSITION OF SPECIMEN:  PATHOLOGY  COUNTS:  YES  TOURNIQUET:  * No tourniquets in log *  DICTATION: .Other Dictation: Dictation Number [id number]  PLAN OF CARE: Discharge to home after PACU  PATIENT DISPOSITION:  PACU - hemodynamically stable.   [name], MD, FACS General & Endocrine Surgery [facility]  Dictated note # [id number]

## 2011-03-15 NOTE — Anesthesia Preprocedure Evaluation (Addendum)
Anesthesia Evaluation  Patient identified by MRN, date of birth, ID band Patient awake    Reviewed: Allergy & Precautions, H&P , NPO status , Patient's Chart, lab work & pertinent test results  Airway Mallampati: I TM Distance: >3 FB Neck ROM: Full    Dental No notable dental hx.    Pulmonary neg pulmonary ROS, asthma ,  clear to auscultation  Pulmonary exam normal       Cardiovascular hypertension, + CAD and neg cardio ROS Regular Normal    Neuro/Psych Negative Neurological ROS  Negative Psych ROS   GI/Hepatic negative GI ROS, Neg liver ROS,   Endo/Other  Negative Endocrine ROSDiabetes mellitus-Hypothyroidism   Renal/GU negative Renal ROS  Genitourinary negative   Musculoskeletal negative musculoskeletal ROS (+)   Abdominal   Peds negative pediatric ROS (+)  Hematology negative hematology ROS (+)   Anesthesia Other Findings   Reproductive/Obstetrics negative OB ROS                          Anesthesia Physical Anesthesia Plan  ASA: III  Anesthesia Plan: General   Post-op Pain Management:    Induction:   Airway Management Planned: Oral ETT  Additional Equipment:   Intra-op Plan:   Post-operative Plan:   Informed Consent: I have reviewed the patients History and Physical, chart, labs and discussed the procedure including the risks, benefits and alternatives for the proposed anesthesia with the patient or authorized representative who has indicated his/her understanding and acceptance.   Dental advisory given  Plan Discussed with: CRNA  Anesthesia Plan Comments:        Anesthesia Quick Evaluation

## 2011-03-16 ENCOUNTER — Encounter: Payer: Self-pay | Admitting: Cardiovascular Disease

## 2011-03-17 ENCOUNTER — Encounter (INDEPENDENT_AMBULATORY_CARE_PROVIDER_SITE_OTHER): Payer: Self-pay | Admitting: Internal Medicine

## 2011-03-17 ENCOUNTER — Encounter (HOSPITAL_COMMUNITY): Payer: Self-pay | Admitting: Surgery

## 2011-03-25 ENCOUNTER — Telehealth: Payer: Self-pay | Admitting: *Deleted

## 2011-03-25 NOTE — Telephone Encounter (Signed)
Message from pt's wife requesting pathology results. Reviewed with Dr. 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 Specimen was sent for further testing. Anticipate results next week. She verbalized understanding.

## 2011-03-28 ENCOUNTER — Other Ambulatory Visit (HOSPITAL_BASED_OUTPATIENT_CLINIC_OR_DEPARTMENT_OTHER): Payer: Medicare Other | Admitting: Lab

## 2011-03-28 ENCOUNTER — Ambulatory Visit (HOSPITAL_BASED_OUTPATIENT_CLINIC_OR_DEPARTMENT_OTHER): Payer: Medicare Other | Admitting: Oncology

## 2011-03-28 ENCOUNTER — Telehealth: Payer: Self-pay | Admitting: Oncology

## 2011-03-28 ENCOUNTER — Other Ambulatory Visit: Payer: Self-pay | Admitting: Oncology

## 2011-03-28 DIAGNOSIS — E039 Hypothyroidism, unspecified: Secondary | ICD-10-CM

## 2011-03-28 DIAGNOSIS — D696 Thrombocytopenia, unspecified: Secondary | ICD-10-CM | POA: Insufficient documentation

## 2011-03-28 DIAGNOSIS — R748 Abnormal levels of other serum enzymes: Secondary | ICD-10-CM

## 2011-03-28 DIAGNOSIS — R591 Generalized enlarged lymph nodes: Secondary | ICD-10-CM

## 2011-03-28 DIAGNOSIS — R599 Enlarged lymph nodes, unspecified: Secondary | ICD-10-CM

## 2011-03-28 DIAGNOSIS — L819 Disorder of pigmentation, unspecified: Secondary | ICD-10-CM

## 2011-03-28 DIAGNOSIS — R5383 Other fatigue: Secondary | ICD-10-CM

## 2011-03-28 DIAGNOSIS — N529 Male erectile dysfunction, unspecified: Secondary | ICD-10-CM

## 2011-03-28 DIAGNOSIS — D61818 Other pancytopenia: Secondary | ICD-10-CM

## 2011-03-28 LAB — CBC WITH DIFFERENTIAL/PLATELET
Eosinophils Absolute: 0.2 10*3/uL (ref 0.0–0.5)
MONO#: 2.6 10*3/uL — ABNORMAL HIGH (ref 0.1–0.9)
NEUT#: 1.9 10*3/uL (ref 1.5–6.5)
Platelets: 101 10*3/uL — ABNORMAL LOW (ref 140–400)
RBC: 3.9 10*6/uL — ABNORMAL LOW (ref 4.20–5.82)
RDW: 14.5 % (ref 11.0–14.6)
WBC: 5.6 10*3/uL (ref 4.0–10.3)
lymph#: 1 10*3/uL (ref 0.9–3.3)

## 2011-03-28 LAB — TECHNOLOGIST REVIEW

## 2011-03-28 NOTE — Progress Notes (Signed)
OFFICE PROGRESS NOTE   INTERVAL HISTORY:   [name] returns as scheduled. He underwent a right cervical lymph node biopsy on November 27. Flow cytometry did not reveal a clonal B. or T cell population. Dr. Laureen Ochs was suspicious of a lymphoproliferative disorder. Immunohistochemical stains did not define a specific diagnosis. The case was referred to the [place] [place]  pathology service. The final report is pending. He continues to have malaise and a lack of taste for food. He plans to undergo a skin cancer reexcision procedure this week.  Objective:  Vital signs in last 24 hours:  Blood pressure 138/75, pulse 75, temperature 97.5 F (36.4 C), temperature source Oral, height 5\' 9"  (1.753 m), weight 199 lb 11.2 oz (90.583 kg).    HEENT: Healing incision at the right neck. Lymphatics: No cervical, supraclavicular, axillary, or inguinal lymph nodes Resp: Lungs clear bilaterally Cardio: Regular rate and rhythm GI: The abdomen is nontender. No hepatomegaly. The spleen tip is palpable a few fingers below the left costal margin. Vascular: No leg edema  Skin: hyperpigmentation diffusely     Lab Results:  Lab Results  Component Value Date   WBC 5.6 03/28/2011   HGB 12.1* 03/28/2011   HCT 37.0* 03/28/2011   MCV 94.9 03/28/2011   PLT 101* 03/28/2011      Medications: I have reviewed the patient's current medications.  Assessment/Plan: 1. History of splenomegaly - the spleen has been palpable on intermittent office visits over the past year.  Splenomegaly was confirmed on an abdominal ultrasound 06/14/2010. 2. Elevated liver enzymes - persistent. 3. Pancytopenia - the platelets are partially improved . 4. History Systemic illness characterized by intermittent fever, weight loss, and malaise.  He continues to have malaise. 5. History of coronary artery disease. 6. Bone marrow biopsy 11/27/2009 at the [facility] and on 12/19/2009 at [facility] confirmed a  hypercellular marrow with mild reticulin fibrosis and no evidence of malignancy. 7. Liver biopsy 02/10/2010 revealed minimal portal and lobular inflammation with evidence of steatosis. 8. Excisional biopsy of a right inguinal lymph node on 02/19/2010 at [facility] with the pathology revealing no evidence of a malignancy. 9. Hypothyroidism - maintained on thyroid hormone replacement. 10. Seroma at the right inguinal lymph node biopsy site - improved. 11. Erectile dysfunction - the testosterone level was low 05/13/2010. 12. Hepatomegaly on the abdominal ultrasound 06/14/2010. 13. Skin hyperpigmentation over the extremities - he reports Dermatology performed a biopsy at the left leg last week.  He has not received the pathology report. 14. Right cervical lymphadenopathy noted on physical exam October 2012 and confirmed on a CT scan       -status post an excisional biopsy of right cervical lymph nodes on 03/15/2011 with the final pathology pending   Disposition:  Mr. Mamone appears stable. He continues to complain of malaise. An enlarged cervical lymph nodes were confirmed on a CT scan and he underwent an excisional lymph node biopsy on November 27. Initial review of the lymph node histology was concerning for a lymphoproliferative disorder. The case was referred to the [facility] and we're waiting on the final opinion. Molecular diagnostics at the [place] [facility] [place] did not reveal a clonal population.  Mr. Hagemann is comfortable with a continued observation approach until we arrived at a specific diagnosis. He will return for an office visit in 2 months. We will contact him later this week with the report from [place].   Lucile Shutters, MD  03/28/2011  1:56 PM

## 2011-03-28 NOTE — Telephone Encounter (Signed)
gve the pt his feb 2013 appt calendar °

## 2011-03-29 ENCOUNTER — Ambulatory Visit (INDEPENDENT_AMBULATORY_CARE_PROVIDER_SITE_OTHER): Payer: Medicare Other | Admitting: Surgery

## 2011-03-29 ENCOUNTER — Encounter (INDEPENDENT_AMBULATORY_CARE_PROVIDER_SITE_OTHER): Payer: Self-pay | Admitting: Surgery

## 2011-03-29 VITALS — BP 128/72 | HR 88 | Temp 97.4°F | Resp 18 | Ht 69.0 in | Wt 202.5 lb

## 2011-03-29 DIAGNOSIS — R591 Generalized enlarged lymph nodes: Secondary | ICD-10-CM

## 2011-03-29 DIAGNOSIS — R599 Enlarged lymph nodes, unspecified: Secondary | ICD-10-CM

## 2011-03-29 HISTORY — PX: EXTERNAL EAR SURGERY: SHX627

## 2011-03-29 NOTE — Patient Instructions (Signed)
  COCOA BUTTER & VITAMIN E CREAM  ([employer] or other brand)  Apply cocoa butter/vitamin E cream to your incision 2 - 3 times daily.  Massage cream into incision for one minute with each application.  Use sunscreen (50 SPF or higher) for first 6 months after surgery.  You may substitute Mederma or other scar reducing creams as desired.   

## 2011-03-29 NOTE — Progress Notes (Signed)
Visit Diagnoses: 1. Lymphadenopathy, neck     HISTORY: Patient underwent right anterior cervical lymph node excisional biopsy. Final results are pending at the [place] [facility] [place]. Patient will continue followup with his medical oncologist.  EXAM: Right neck incision healing nicely with minimal soft tissue swelling. No sign of infection. No significant seroma or lymphocele. Voice quality is normal.  IMPRESSION: Right anterior cervical lymphadenopathy, rule out lymphoma, final pathologic results pending  PLAN: Patient will begin applying topical creams to his incision. He will followup with his medical oncologist. He will return to this office as needed.   [name], MD, FACS General & Endocrine Surgery [facility]

## 2011-04-06 ENCOUNTER — Telehealth: Payer: Self-pay | Admitting: *Deleted

## 2011-04-06 NOTE — Telephone Encounter (Signed)
Left message on voicemail for pt to call office. Wife requested copy of pathology report and testosterone & TSH levels from last week. Not resulted in computer. Pt will need to come in to have labs drawn.

## 2011-04-07 ENCOUNTER — Telehealth: Payer: Self-pay | Admitting: Oncology

## 2011-04-07 ENCOUNTER — Other Ambulatory Visit: Payer: Self-pay | Admitting: *Deleted

## 2011-04-07 NOTE — Telephone Encounter (Signed)
lmonvm adviisng the pt of his lab appt on 04/08/2011

## 2011-04-08 ENCOUNTER — Other Ambulatory Visit: Payer: Self-pay | Admitting: *Deleted

## 2011-04-08 ENCOUNTER — Other Ambulatory Visit (HOSPITAL_BASED_OUTPATIENT_CLINIC_OR_DEPARTMENT_OTHER): Payer: Medicare Other | Admitting: Lab

## 2011-04-08 DIAGNOSIS — E039 Hypothyroidism, unspecified: Secondary | ICD-10-CM

## 2011-04-08 LAB — TESTOSTERONE: Testosterone: 199.7 ng/dL — ABNORMAL LOW (ref 250–890)

## 2011-04-08 LAB — TSH: TSH: 5.564 u[IU]/mL — ABNORMAL HIGH (ref 0.350–4.500)

## 2011-04-11 ENCOUNTER — Telehealth: Payer: Self-pay | Admitting: *Deleted

## 2011-04-11 NOTE — Telephone Encounter (Signed)
Message copied by Wandalee Ferdinand on Mon Apr 11, 2011  1:41 PM ------      Message from: Ladene Artist      Created: Sun Apr 10, 2011  6:15 PM       Please call patient, Mrs. Hudler would like copy of tsh/testosterone.            Copy to Dr. Jacky Kindle.

## 2011-04-11 NOTE — Telephone Encounter (Signed)
Left VM with results of TSH and testosterone level. Sent hardcopy in mail.

## 2011-04-14 ENCOUNTER — Other Ambulatory Visit: Payer: Self-pay | Admitting: *Deleted

## 2011-04-14 ENCOUNTER — Telehealth: Payer: Self-pay | Admitting: *Deleted

## 2011-04-14 DIAGNOSIS — D696 Thrombocytopenia, unspecified: Secondary | ICD-10-CM

## 2011-04-14 NOTE — Telephone Encounter (Signed)
Received vm call from pt's wife stating that pt has a fine rash, deep red in some areas that doesn't itch or burn. Returned call to wife & she states that pt just noticed today after his shower & it is widespread on back, abd. arms, legs, & shoulders.  She reports that it is flat/under skin & looks like petechia.  She states that he was lethargic last hs & felt better today but lethargic again this pm & c/o of being cold but doesn't think he has fever but hasn't taken it.  She reports nothing new in terms of soaps, clothing, meds, etc.  He has eaten some grapefruit & clementines daily since last week.   Will discuss with Dr. Truett Perna.  Return ph # is [phone]

## 2011-04-14 NOTE — Telephone Encounter (Signed)
Spoke to wife again & told to take pt to ED if any bleeding but otherwise we can check cbc here or Dr. Lanell Matar tomorrow.  She would like to come here.  Order placed & routed to schedulers.

## 2011-04-15 ENCOUNTER — Encounter: Payer: Medicare Other | Admitting: Oncology

## 2011-04-15 ENCOUNTER — Other Ambulatory Visit (HOSPITAL_BASED_OUTPATIENT_CLINIC_OR_DEPARTMENT_OTHER): Payer: Medicare Other

## 2011-04-15 ENCOUNTER — Inpatient Hospital Stay (HOSPITAL_COMMUNITY): Payer: Medicare Other

## 2011-04-15 ENCOUNTER — Other Ambulatory Visit: Payer: Self-pay | Admitting: *Deleted

## 2011-04-15 ENCOUNTER — Inpatient Hospital Stay (HOSPITAL_COMMUNITY)
Admission: AD | Admit: 2011-04-15 | Discharge: 2011-04-19 | DRG: 813 | Disposition: A | Discharge status: Home / Self Care | Payer: Medicare Other | Source: Ambulatory Visit | Attending: Oncology | Admitting: Oncology

## 2011-04-15 ENCOUNTER — Telehealth: Payer: Self-pay | Admitting: *Deleted

## 2011-04-15 DIAGNOSIS — R599 Enlarged lymph nodes, unspecified: Secondary | ICD-10-CM | POA: Diagnosis not present

## 2011-04-15 DIAGNOSIS — R161 Splenomegaly, not elsewhere classified: Secondary | ICD-10-CM | POA: Diagnosis not present

## 2011-04-15 DIAGNOSIS — K921 Melena: Secondary | ICD-10-CM

## 2011-04-15 DIAGNOSIS — D693 Immune thrombocytopenic purpura: Secondary | ICD-10-CM | POA: Diagnosis present

## 2011-04-15 DIAGNOSIS — R233 Spontaneous ecchymoses: Secondary | ICD-10-CM

## 2011-04-15 DIAGNOSIS — D696 Thrombocytopenia, unspecified: Secondary | ICD-10-CM

## 2011-04-15 DIAGNOSIS — E119 Type 2 diabetes mellitus without complications: Secondary | ICD-10-CM | POA: Diagnosis present

## 2011-04-15 DIAGNOSIS — H409 Unspecified glaucoma: Secondary | ICD-10-CM | POA: Diagnosis present

## 2011-04-15 DIAGNOSIS — J45909 Unspecified asthma, uncomplicated: Secondary | ICD-10-CM | POA: Diagnosis present

## 2011-04-15 DIAGNOSIS — C4490 Unspecified malignant neoplasm of skin, unspecified: Secondary | ICD-10-CM | POA: Insufficient documentation

## 2011-04-15 DIAGNOSIS — Z9861 Coronary angioplasty status: Secondary | ICD-10-CM | POA: Diagnosis not present

## 2011-04-15 DIAGNOSIS — D649 Anemia, unspecified: Secondary | ICD-10-CM

## 2011-04-15 DIAGNOSIS — E785 Hyperlipidemia, unspecified: Secondary | ICD-10-CM | POA: Diagnosis present

## 2011-04-15 DIAGNOSIS — E039 Hypothyroidism, unspecified: Secondary | ICD-10-CM | POA: Diagnosis present

## 2011-04-15 DIAGNOSIS — I2510 Atherosclerotic heart disease of native coronary artery without angina pectoris: Secondary | ICD-10-CM | POA: Diagnosis present

## 2011-04-15 DIAGNOSIS — K922 Gastrointestinal hemorrhage, unspecified: Secondary | ICD-10-CM

## 2011-04-15 DIAGNOSIS — R591 Generalized enlarged lymph nodes: Secondary | ICD-10-CM

## 2011-04-15 HISTORY — DX: Gastrointestinal hemorrhage, unspecified: K92.2

## 2011-04-15 LAB — CBC WITH DIFFERENTIAL/PLATELET
BASO%: 0.4 % (ref 0.0–2.0)
Eosinophils Absolute: 0.1 10*3/uL (ref 0.0–0.5)
MONO#: 2.3 10*3/uL — ABNORMAL HIGH (ref 0.1–0.9)
MONO%: 42.6 % — ABNORMAL HIGH (ref 0.0–14.0)
NEUT#: 2.1 10*3/uL (ref 1.5–6.5)
RBC: 3.28 10*6/uL — ABNORMAL LOW (ref 4.20–5.82)
RDW: 15.1 % — ABNORMAL HIGH (ref 11.0–14.6)
WBC: 5.3 10*3/uL (ref 4.0–10.3)
nRBC: 0 % (ref 0–0)

## 2011-04-15 LAB — COMPREHENSIVE METABOLIC PANEL
Alkaline Phosphatase: 166 U/L — ABNORMAL HIGH (ref 39–117)
BUN: 30 mg/dL — ABNORMAL HIGH (ref 6–23)
CO2: 25 mEq/L (ref 19–32)
Chloride: 100 mEq/L (ref 96–112)
Creatinine, Ser: 0.99 mg/dL (ref 0.50–1.35)
GFR calc Af Amer: >90 mL/min (ref >90)
GFR calc non Af Amer: 82 mL/min — ABNORMAL LOW (ref >90)
Glucose, Bld: 96 mg/dL (ref 70–99)
Total Bilirubin: 0.6 mg/dL (ref 0.3–1.2)

## 2011-04-15 LAB — TECHNOLOGIST REVIEW

## 2011-04-15 LAB — PROTIME-INR: INR: 0.99 (ref 0.00–1.49)

## 2011-04-15 LAB — URINALYSIS, ROUTINE W REFLEX MICROSCOPIC
Bilirubin Urine: NEGATIVE
Ketones, ur: NEGATIVE mg/dL
Leukocytes, UA: NEGATIVE
Nitrite: NEGATIVE
Protein, ur: NEGATIVE mg/dL

## 2011-04-15 LAB — LACTATE DEHYDROGENASE: LDH: 209 U/L (ref 94–250)

## 2011-04-15 MED ORDER — OLMESARTAN MEDOXOMIL 20 MG PO TABS
20.0000 mg | ORAL_TABLET | Freq: Every day | ORAL | Status: DC
  Administered 2011-04-16 – 2011-04-19 (×4): 20 mg via ORAL
  Filled 2011-04-15 (×4): qty 1

## 2011-04-15 MED ORDER — DIPHENHYDRAMINE HCL 50 MG PO CAPS
50.0000 mg | ORAL_CAPSULE | Freq: Once | ORAL | Status: AC
  Administered 2011-04-15: 50 mg via ORAL
  Filled 2011-04-15: qty 1

## 2011-04-15 MED ORDER — ZOLPIDEM TARTRATE 5 MG PO TABS
5.0000 mg | ORAL_TABLET | Freq: Every evening | ORAL | Status: DC | PRN
  Administered 2011-04-18: 5 mg via ORAL
  Filled 2011-04-15: qty 1

## 2011-04-15 MED ORDER — LEVOTHYROXINE SODIUM 100 MCG PO TABS
100.0000 ug | ORAL_TABLET | Freq: Every day | ORAL | Status: DC
  Administered 2011-04-16 – 2011-04-19 (×4): 100 ug via ORAL
  Filled 2011-04-15 (×4): qty 1

## 2011-04-15 MED ORDER — SODIUM CHLORIDE 0.9 % IV SOLN
INTRAVENOUS | Status: DC
  Administered 2011-04-15 – 2011-04-18 (×2): 1000 mL via INTRAVENOUS

## 2011-04-15 MED ORDER — ACETAMINOPHEN 325 MG PO TABS
650.0000 mg | ORAL_TABLET | Freq: Once | ORAL | Status: AC
  Administered 2011-04-15: 650 mg via ORAL
  Filled 2011-04-15: qty 2

## 2011-04-15 MED ORDER — METHYLPREDNISOLONE SODIUM SUCC 125 MG IJ SOLR
100.0000 mg | Freq: Two times a day (BID) | INTRAMUSCULAR | Status: DC
Start: 2011-04-15 — End: 2011-04-19
  Administered 2011-04-15: 100 mg via INTRAVENOUS
  Administered 2011-04-16: 08:00:00 via INTRAVENOUS
  Administered 2011-04-16 – 2011-04-18 (×5): 100 mg via INTRAVENOUS
  Filled 2011-04-15 (×9): qty 1.6

## 2011-04-15 MED ORDER — ALBUTEROL SULFATE HFA 108 (90 BASE) MCG/ACT IN AERS
2.0000 | INHALATION_SPRAY | Freq: Four times a day (QID) | RESPIRATORY_TRACT | Status: DC | PRN
  Filled 2011-04-15: qty 6.7

## 2011-04-15 MED ORDER — NITROGLYCERIN 0.4 MG SL SUBL: 0.4000 mg | SUBLINGUAL_TABLET | SUBLINGUAL | Status: DC | PRN

## 2011-04-15 MED ORDER — ACETAMINOPHEN 650 MG RE SUPP: 650.0000 mg | Freq: Four times a day (QID) | RECTAL | Status: DC | PRN

## 2011-04-15 MED ORDER — IMMUNE GLOBULIN (HUMAN) 20 GM/200ML IV SOLN
90.0000 g | INTRAVENOUS | Status: AC
  Administered 2011-04-15 – 2011-04-17 (×3): 90 g via INTRAVENOUS
  Filled 2011-04-15 (×3): qty 900

## 2011-04-15 MED ORDER — LOTEPREDNOL ETABONATE 0.5 % OP SUSP
1.0000 [drp] | Freq: Two times a day (BID) | OPHTHALMIC | Status: DC
  Administered 2011-04-15 – 2011-04-19 (×9): 1 [drp] via OPHTHALMIC
  Filled 2011-04-15: qty 5

## 2011-04-15 MED ORDER — ACETAMINOPHEN 325 MG PO TABS
650.0000 mg | ORAL_TABLET | Freq: Four times a day (QID) | ORAL | Status: DC | PRN
  Administered 2011-04-17: 650 mg via ORAL
  Filled 2011-04-15: qty 2

## 2011-04-15 NOTE — Telephone Encounter (Signed)
Unable to locate patient in lobby after lab draw and platelet count is <6. Called wife's cell # and instructed her to have him return to office now.

## 2011-04-15 NOTE — H&P (Signed)
Patient History and Physical   Nathan Kemp [id number] 1940/12/07 70 y.o. 04/15/2011    Patient Identification: Nathan Kemp is a 70 year old admitted with severe thrombocytopenia.  HPI: [name] has been ill with a systemic illness since the spring of 2011. He initially had fever, malaise, and anorexia/weight loss. There was associated thrombocytopenia, splenomegaly, and elevation of the liver enzymes. He underwent an extensive diagnostic evaluation which included a bone marrow biopsy, liver biopsy, right inguinal lymph node biopsy, and an infectious disease evaluation. No specific diagnosis was made. He was seen in consultation by the hematology service at [facility] and at the [facility] clinic.  The fever and malaise spontaneously improved over several months. Mild elevation of the liver enzymes and moderate thrombocytopenia persisted. He received no specific treatment. A few months ago he developed right neck lymphadenopathy. He underwent an excisional lymph node biopsy by Dr. Gerrit Friends on 03/15/2011. The pathology from a right cervical lymph node was suspicious for involvement by a marginal zone lymphoma, but a definitive diagnosis was not made. The case was seen in consultation by the hematology service at the [place] [facility] [place]. Some cells were positive for EBV. A clonal population was not confirmed. We decided to follow him with an observation approach unless he developed progressive symptoms suggestive of lymphoma.  He reports stable malaise and no new symptoms. He noted the acute onset of a diffuse body erythematous/purple rash yesterday. He saw Dr. Londell Moh earlier today. I was telephoned by Dr. Londell Moh with the report of a petechial rash. Nathan Kemp presented to the cancer Center for further evaluation. He denies bleeding aside from a bruise at the lower lip and mild bleeding in the stool today. He has no other new symptoms.    PMH:  Past Medical History  Diagnosis Date  . Anorexia     . Thrombocytopenia-chronic moderate thrombocytopenia since 2011   . Fatigue   . Hypertension   . Hyperlipidemia   . Vitamin D deficiency   . ED (erectile dysfunction)   . Obesity   . DDD (degenerative disc disease), cervical   . Anemia   . Thyroid disease   . Trouble swallowing   . Cough   . Leg swelling     due to lymphedema-following the right inguinal lymph node biopsy in 2011 .improved .  Marland Kitchen Asthma   . Hypothyroidism   . Blood dyscrasia 06/2009    low platelet count  . Coronary artery disease   . Diabetes mellitus     borderline-controlled by diet    Past Surgical History  Procedure Date  . Cardiac catheterization 01/31/2005    EF 60-65%  . Coronary angioplasty with stent placement 2006    STENTING OF HIS CORONARY ARTERY  . Appendectomy 1967  . US echocardiography 02/28/2005    EF 55-60%  . Tonsillectomy and adenoidectomy   . Nasal sinus surgery 2005  . Edg dilitation 2007  . Lymph node removal 2011    right groin-at [facility], non-diagnostic   . Bone marrow biopsy     2011-at [facility], 01/2010-at [place], 06/12-[facility]  . Lymph node biopsy 03/15/2011    Procedure: LYMPH NODE BIOPSY;  Surgeon: [name], MD;  Location: [facility] ORS;  Service: General;  Laterality: Right;  Right Anterior Cervical Lymph Node Excisional Biopsy   . 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 Lortab (Hydrocodone-Acetaminophen) Other (  See Comments)    Makes patient very loopy  . Niaspan (Niacin) Hives  . Clindamycin Nausea Only and Other (See Comments)    Stomach cramps  . Codeine Rash    Had rash previously but not recently-taken in cough medicine and no problems    Medications:  Medications Prior to Admission  Medication Dose Route Frequency Provider Last Rate Last Dose  . 0.9 %  sodium chloride infusion   Intravenous Continuous Lucile Shutters, MD      . acetaminophen (TYLENOL)  tablet 650 mg  650 mg Oral Q6H PRN Lucile Shutters, MD       Or  . acetaminophen (TYLENOL) suppository 650 mg  650 mg Rectal Q6H PRN Lucile Shutters, MD      . albuterol (PROVENTIL HFA;VENTOLIN HFA) 108 (90 BASE) MCG/ACT inhaler 2 puff  2 puff Inhalation Q6H PRN Lucile Shutters, MD      . levothyroxine (SYNTHROID, LEVOTHROID) tablet 100 mcg  100 mcg Oral QAC breakfast Lucile Shutters, MD      . loteprednol (LOTEMAX) 0.5 % ophthalmic suspension 1 drop  1 drop Both Eyes BID Lucile Shutters, MD      . nitroGLYCERIN (NITROSTAT) SL tablet 0.4 mg  0.4 mg Sublingual Q5 Min x 3 PRN Lucile Shutters, MD      . olmesartan [facility]) tablet 20 mg  20 mg Oral Daily Lucile Shutters, MD      . zolpidem [facility]) tablet 5 mg  5 mg Oral QHS PRN Lucile Shutters, MD       Medications Prior to Admission  Medication Sig Dispense Refill  . albuterol (PROVENTIL HFA;VENTOLIN HFA) 108 (90 BASE) MCG/ACT inhaler Inhale 2 puffs into the lungs every 6 (six) hours as needed. Wheezing       . diphenhydrAMINE (BENADRYL) 25 MG tablet Take 50 mg by mouth at bedtime.       Marland Kitchen ibuprofen (ADVIL,MOTRIN) 200 MG tablet Take 400 mg by mouth every 6 (six) hours as needed. pain      . Levothyroxine Sodium (SYNTHROID PO) Take 100 mcg by mouth every morning.       . loteprednol (LOTEMAX) 0.5 % ophthalmic suspension Place 1 drop into both eyes 2 (two) times daily.       . mometasone-formoterol (DULERA) 100-5 MCG/ACT AERO Inhale 2 puffs into the lungs 2 (two) times daily.       . nitroGLYCERIN (NITROSTAT) 0.4 MG SL tablet Place 0.4 mg under the tongue every 5 (five) minutes as needed. Chest pain      . olmesartan-hydrochlorothiazide (BENICAR HCT) 40-25 MG per tablet Take 0.5 tablets by mouth every morning.       Marland Kitchen Phenylephrine-DM-GG (TUSSEX PO) Take 5 mLs by mouth at bedtime.       Marland Kitchen zinc gluconate 50 MG tablet Take 50 mg by mouth daily.       Marland Kitchen zolpidem (AMBIEN) 10 MG tablet Take 5 mg by mouth  at bedtime.         Social History:    reports that he has never smoked. He has never used smokeless tobacco. He reports that he does not drink alcohol or use illicit drugs. he lives with his wife.  Family History:  Family History  Problem Relation Age of Onset  . Heart failure Father   . Hypertension Father   . Hearing loss Father 70    congestive heart failure  . Hypertension Mother   . Stroke Mother 70  Review of Systems:  Positives include: Chronic malaise and anorexia. Diffuse erythematous/purple rash over the trunk and extremities beginning on 04/14/2011. Chronic cough. Chronic edema of the right lower leg.  A complete ROS was otherwise negative.   Physical Exam:  Blood pressure 131/75, pulse 99, temperature 98.1 F (36.7 C), temperature source Oral, resp. rate 20, SpO2 94.00%.  HEENT: Small ecchymoses at the left buccal mucosa. Small ecchymosis at the lower lip. No active bleeding. No thrush. Left ear surgical site with a healed incision and a small amount of bleeding at the upper aspect after removing an adhesive dressing Lungs: Inspiratory rhonchi at the left base. No respiratory distress Cardiac: Regular rate and rhythm, 2/6 systolic murmur Abdomen: The spleen is palpable a few fingers below the left costal margin. No hepatomegaly. Nontender.  Vascular: Trace edema at the right lower leg Lymph nodes: There is a soft mobile 2 cm left posterior cervical node. Prominent bilateral axillary fat pads. ? 1/2 cm right axillary node. No supraclavicular or inguinal nodes. Neurologic: Alert and oriented. The motor examination appears grossly intact throughout. Skin: Diffuse petechial rash over the trunk and extremities. Brown discoloration at the lower leg bilaterally (chronic) Healed incision at the right neck.  Lab Results:  Lab Results  Component Value Date   WBC 5.3 04/15/2011   HGB 10.2* 04/15/2011   HCT 31.0* 04/15/2011   MCV 94.5 04/15/2011   PLT < 6*  04/15/2011   ANC 2.1, absolute lymphocyte count 0.8, absolute monocyte count 2.3 (0.1-0.9)  Peripheral blood smear: Markedly decrease in the platelet count. There are a few large platelets. No platelet clumps. The polychromasia is mildly increased. A few teardrops. No schistocytes. There are an increased number of monocytes, some have granules. No blast forms. There are bands and  bilobed neutrophils.  Impression and Plan:  1. Acute onset severe thrombocytopenia with a history of chronic mild to moderate thrombocytopenia.  2. Splenomegaly  3. Petechial rash secondary to severe thrombocytopenia  4. Anemia  5. Chronic malaise and anorexia  6. History of elevated liver enzymes  7. Lymphadenopathy, status post a right cervical lymph node biopsy on 03/15/2011 with the pathology suspicious for involvement by a marginal zone lymphoma, a definitive diagnosis was not made.  8. New left posterior cervical lymph node.   He has developed acute severe thrombocytopenia. 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 He reports no recent infection or other event that would be associated with severe thrombocytopenia.  There is no clinical evidence of a systemic infection. We will check a chest x-ray with the history of a chronic cough and the abnormal lung exam today.  He'll be admitted for acute IVIG therapy and initiation of steroids. We will monitor the CBC and his clinical status closely. We will consider additional diagnostic evaluation and treatment for lymphoma if the thrombocytopenia does not respond to the above regimen.  I discussed the potential toxicities associated with IVIG including an allergic reaction and viral transmission. We discussed the side effects associated with steroid use. He agrees to proceed. We elected to not transfuse platelets unless he develops significant bleeding or there is no response to IVIG over the next few days.      Lucile Shutters, MD 04/15/2011, 6:44 PM

## 2011-04-15 NOTE — Telephone Encounter (Signed)
Dr. Truett Perna will see patient in office now and admit to hospital. Called bed control and will be admitted to [facility].

## 2011-04-16 ENCOUNTER — Inpatient Hospital Stay (HOSPITAL_COMMUNITY): Payer: Medicare Other

## 2011-04-16 DIAGNOSIS — K922 Gastrointestinal hemorrhage, unspecified: Secondary | ICD-10-CM | POA: Diagnosis not present

## 2011-04-16 HISTORY — DX: Gastrointestinal hemorrhage, unspecified: K92.2

## 2011-04-16 LAB — DIFFERENTIAL
Basophils Absolute: 0 10*3/uL (ref 0.0–0.1)
Basophils Absolute: 0.1 10*3/uL (ref 0.0–0.1)
Basophils Relative: 1 % (ref 0–1)
Eosinophils Absolute: 0.1 10*3/uL (ref 0.0–0.7)
Eosinophils Relative: 1 % (ref 0–5)
Lymphocytes Relative: 23 % (ref 12–46)
Lymphocytes Relative: 20 % (ref 12–46)
Monocytes Absolute: 0.4 10*3/uL (ref 0.1–1.0)
Monocytes Relative: 14 % — ABNORMAL HIGH (ref 3–12)
Neutro Abs: 2 10*3/uL (ref 1.7–7.7)
Neutrophils Relative %: 60 % (ref 43–77)
Neutrophils Relative %: 64 % (ref 43–77)

## 2011-04-16 LAB — DIRECT ANTIGLOBULIN TEST (NOT AT ARMC)
DAT, IgG: NEGATIVE
DAT, complement: NEGATIVE

## 2011-04-16 LAB — COMPREHENSIVE METABOLIC PANEL
AST: 18 U/L (ref 0–37)
Albumin: 3.2 g/dL — ABNORMAL LOW (ref 3.5–5.2)
Alkaline Phosphatase: 150 U/L — ABNORMAL HIGH (ref 39–117)
Chloride: 100 mEq/L (ref 96–112)
Potassium: 4.2 mEq/L (ref 3.5–5.1)
Sodium: 130 mEq/L — ABNORMAL LOW (ref 135–145)
Total Bilirubin: 0.4 mg/dL (ref 0.3–1.2)
Total Protein: 8.9 g/dL — ABNORMAL HIGH (ref 6.0–8.3)

## 2011-04-16 LAB — LACTATE DEHYDROGENASE: LDH: 194 U/L (ref 94–250)

## 2011-04-16 LAB — CBC
MCH: 31.3 pg (ref 26.0–34.0)
MCHC: 33.2 g/dL (ref 30.0–36.0)
MCV: 93 fL (ref 78.0–100.0)
Platelets: 8 10*3/uL — CL (ref 150–400)
RBC: 2.87 MIL/uL — ABNORMAL LOW (ref 4.22–5.81)
RDW: 14.9 % (ref 11.5–15.5)
RDW: 14.7 % (ref 11.5–15.5)
WBC: 5.5 10*3/uL (ref 4.0–10.5)

## 2011-04-16 LAB — ABO/RH: ABO/RH(D): O POS

## 2011-04-16 LAB — TYPE AND SCREEN
ABO/RH(D): O POS
Antibody Screen: NEGATIVE

## 2011-04-16 MED ORDER — DIPHENHYDRAMINE HCL 50 MG/ML IJ SOLN
25.0000 mg | Freq: Once | INTRAMUSCULAR | Status: AC
  Administered 2011-04-16: 25 mg via INTRAVENOUS
  Filled 2011-04-16: qty 1

## 2011-04-16 MED ORDER — ACETAMINOPHEN 325 MG PO TABS
650.0000 mg | ORAL_TABLET | Freq: Once | ORAL | Status: AC
  Administered 2011-04-16: 650 mg via ORAL
  Filled 2011-04-16: qty 2

## 2011-04-16 MED ORDER — BIOTENE DRY MOUTH MT LIQD: 15.0000 mL | OROMUCOSAL | Status: DC | PRN

## 2011-04-16 NOTE — Progress Notes (Signed)
Blood Bank notified this nurse that they did not have any platelets in house, but would notify us of their arrival.  Onalee Hua RN

## 2011-04-16 NOTE — Progress Notes (Signed)
Pt. Had a small black tarry stool, with visible blood and clots.  To notify MD on call.

## 2011-04-16 NOTE — Progress Notes (Signed)
  04/16/2011, 8:30 AM  Hospital day: 2 Antibiotics: none Chemotherapy: none IVIG day 2, Steroids day2   Subjective: Awake, wife here. Slept almost none last pm, but feels ok now. Some blood with BM yesterday am and again now  blood on cloth with wiping following BM ( but did not look at stool before flushing.)  No other overt bleeding. Appetite good, no SOB or cough, stable weakness as he has had this past year, no pain. No apparent bleeding where stitches taken out from left ear (excision of nonmelanoma skin ca). Petechial rash on trunk stable and increase in LE petechiae stable since developed on 12-27. New left cervical LN not tender. Peripheral IV site ok.  Objective: Vital signs in last 24 hours: Blood pressure 120/75, pulse 90, temperature 97.8 F (36.6 C), temperature source Oral, resp. rate 18, height 5\' 9"  (1.753 m), weight 202 lb (91.627 kg), SpO2 96.00%. Tmax 99.3  Intake/Output from previous day: 12/28 0701 - 12/29 0700 In: 1832.3 [P.O.:640; I.V.:192.3; IV Piggyback:1000] Out: 1000 [Urine:1000]  IV NS at [facility] Intake/Output this shift:    Physical exam: Alert and appropriate, speech fluent, very pleasant, wife very supportive.  Oral mucosa moist and clear other than <0.5 submucosal bleed left posterior buccal and 2 mm area right buccal. PERRL, not obviously icteric. Lungs clear. Dressing dry on left pinna. Cor RRR. Abd soft, some BS, NT. Perirectal no obvious bleeding, no significant external hemorrhoids. LE no edema/ cords. Requires assistance of wife to stand up from side of bed (not new). Skin with scattered petechiae upper back and chest, more prominent and confluent waist, dark discoloration entire LE bilat with petechiae also. Feet warm. Peripheral IV site ok.   Lab Results:  Basename 04/16/11 0320 04/15/11 1530  WBC 3.2* 5.3  HGB 8.7* 10.2*  HCT 26.2* 31.0*  PLT <5* < 6*  Will repeat CBC at 1500 today. Add haptoglobin, check DAT BMET  [facility] 04/15/11 1929  NA  134*  K 4.2  CL 100  CO2 25  GLUCOSE 96  BUN 30*  CREATININE 0.99  CALCIUM 9.2   Remainder of CMET from admission with AP 166, glucose 96, BUN 30.  Tbili 0.6 Full CMET and LDH ordered to add to blood already drawn today.   Studies/Results: No results found. CXR seems to be done now  Assessment/Plan: 1. 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  Began IVIG yesterday @ 2100  And will continue q 24 hrs; also begun on solumedrol q 12 hrs.since last pm.  No change in extremely low platelets yet. Transfuse platelets if significant bleeding. 2. Anemia: hgb down from 10.2 yesterday, with ? amount of bleeding with BMs. Will check for hemolysis and follow up CBC 1500 today. PRBCs if drops significantly or symptoms. 3. CAD and HTN 4.hypothyroidism 5. Borderline DM, diet controlled. Follow blood sugar on steroids. 6. Asthma  Discussed with nursing. See orders. Nathan Kemp P

## 2011-04-16 NOTE — Progress Notes (Signed)
CBC results, communicated to on-call MD.  Phyllis Ginger

## 2011-04-17 LAB — CBC
MCH: 31.4 pg (ref 26.0–34.0)
MCHC: 33.2 g/dL (ref 30.0–36.0)
MCV: 94.5 fL (ref 78.0–100.0)
Platelets: 42 10*3/uL — ABNORMAL LOW (ref 150–400)
RDW: 15.1 % (ref 11.5–15.5)

## 2011-04-17 LAB — COMPREHENSIVE METABOLIC PANEL
Albumin: 2.8 g/dL — ABNORMAL LOW (ref 3.5–5.2)
Alkaline Phosphatase: 137 U/L — ABNORMAL HIGH (ref 39–117)
BUN: 28 mg/dL — ABNORMAL HIGH (ref 6–23)
Chloride: 104 mEq/L (ref 96–112)
Creatinine, Ser: 0.75 mg/dL (ref 0.50–1.35)
GFR calc Af Amer: >90 mL/min (ref >90)
Glucose, Bld: 205 mg/dL — ABNORMAL HIGH (ref 70–99)
Potassium: 4 mEq/L (ref 3.5–5.1)
Total Bilirubin: 0.4 mg/dL (ref 0.3–1.2)
Total Protein: 9 g/dL — ABNORMAL HIGH (ref 6.0–8.3)

## 2011-04-17 LAB — PREPARE PLATELET PHERESIS

## 2011-04-17 MED ORDER — DIPHENHYDRAMINE HCL 25 MG PO CAPS
25.0000 mg | ORAL_CAPSULE | Freq: Four times a day (QID) | ORAL | Status: DC | PRN
  Administered 2011-04-17: 25 mg via ORAL
  Filled 2011-04-17: qty 1

## 2011-04-17 MED ORDER — ACETAMINOPHEN 325 MG PO TABS
325.0000 mg | ORAL_TABLET | Freq: Four times a day (QID) | ORAL | Status: DC | PRN
  Administered 2011-04-17: 325 mg via ORAL
  Filled 2011-04-17: qty 1

## 2011-04-17 MED ORDER — PANTOPRAZOLE SODIUM 40 MG PO TBEC
40.0000 mg | DELAYED_RELEASE_TABLET | Freq: Every day | ORAL | Status: DC
  Administered 2011-04-17 – 2011-04-19 (×3): 40 mg via ORAL
  Filled 2011-04-17 (×3): qty 1

## 2011-04-17 NOTE — Progress Notes (Signed)
Results of CBC, communicated to on-call MD (Dr. Darrold Span)  Philomena Doheny RN

## 2011-04-17 NOTE — Progress Notes (Signed)
  04/17/2011, 8:01 AM  Hospital day: 3 Antibiotics: none Chemotherapy: none    Subjective: Awake, alert, no pain, no SOB, "I feel pretty well". Had several tarry stools with BRB during night. Second IV started upper right arm to give platelets while IVIG also in process. No other bleeding noted, no BM since late in night. Slept little with above. Appetite good. Petechiae and oral areas seem better to wife. Using baby wipes from home and lotion to skin. Lots of flatus likely with blood in GI tract. Has had colonoscopy by Dr.John Madilyn Fireman in past; has had diverticular disease in past but does not have similar sx now. No GERD symptoms.  Objective: Vital signs in last 24 hours: Blood pressure 127/78, pulse 74, temperature 98 F (36.7 C), temperature source Oral, resp. rate 18, height 5\' 9"  (1.753 m), weight 202 lb (91.627 kg), SpO2 98.00%. Tmax 98.7 on steroids Looks comfortable sitting on side of bed on RA. Able to rise to standing without assistance this am. Oral mucosa moist with near resolution of areas of submucosal bleeding. Ecchymosis beneath lower lip better. Site of skin bx left pinna with dry bandaid. Lungs clear. Petechiae on trunk no worse/ seem a little better. Abd full, quiet, NT. LE 1+ pedal edema RLE (which is baseline since LN removed from right groin), no cords or tenderness. No swelling LLE. IV sites x2 RUE ok. IVF NS at [facility] in one site, other locked. Intake/Output from previous day: 12/29 0701 - 12/30 0700 In: 480 [P.O.:480] Out: 1650 [Urine:1650] Intake/Output this shift:      Lab Results:  Basename 04/17/11 0327 04/16/11 1440  WBC 8.3 5.5  HGB 8.0* 8.9*  HCT 23.8* 26.7*  PLT 29* 8*  I spoke with blood bank, requesting keep platelets available next 24 hrs if possible BMET  Basename 04/17/11 0327 04/16/11 1010  NA 133* 130*  K 4.0 4.2  CL 104 100  CO2 22 23  GLUCOSE 205* 146*  BUN 28* 30*  CREATININE 0.75 0.72  CALCIUM 8.7 8.9  Glucose up on steroids -- will  follow Slight hemolysis noted on this specimen. Tprotein 9.0, alb 2.8   Tbili 0.4 Studies/Results: Dg Chest 2 View  04/16/2011  *RADIOLOGY REPORT*  Clinical Data: Idiopathic thrombocytopenia.  CHEST - 2 VIEW  Comparison: 01/28/2005  Findings: The heart size and vascularity are normal and the lungs are clear.  No effusions.  No significant osseous abnormality.  IMPRESSION: Normal chest.  Original Report Authenticated By: Gwynn Burly, M.D.     Assessment/Plan: 1.Acutely worse thrombocytopenia with extensive petechiae and GI bleeding in patient with suspected underlying lymphoproliferative disorder x months: with improvement in mouth and petechiae may be getting some early response, also higher count now since platelet transfusion. If further GI bleeding will give platelets again; may need PRBCs also tho does not seem symptomatic now. Add Protonix. Continue IVIG and bid steroids. 2. Hyperglycemia: baseline mild elevation in blood sugar, up with steroids. Will follow and cover with SS insulin if higher. 3. CAH and HTN 4.hypothyroidism on replacement 5.asthma  Patient and wife were comfortable with discussion and had questions answered to their satisfaction. Not stable for DC. Discussed with RN.  Jama Flavors P

## 2011-04-17 NOTE — Progress Notes (Signed)
Pt. Had a medium sized brown stool, with no clots.  Philomena Doheny RN

## 2011-04-17 NOTE — Significant Event (Deleted)
CRITICAL VALUE ALERT  Critical value received:  Potassium  Date of notification:  [id number]  Time of notification:  0555  Critical value read back:yes  Nurse who received alert:  Coralie Common  MD notified (1st page):  Tom callahan  Time of first page:  0700  MD notified (2nd page): V. Akula  Time of second [id number]  Responding MD:  Thurman Coyer  Time MD responded:  [phone]

## 2011-04-18 DIAGNOSIS — R599 Enlarged lymph nodes, unspecified: Secondary | ICD-10-CM

## 2011-04-18 LAB — CBC
HCT: 23.8 % — ABNORMAL LOW (ref 39.0–52.0)
HCT: 22.7 % — ABNORMAL LOW (ref 39.0–52.0)
Hemoglobin: 8 g/dL — ABNORMAL LOW (ref 13.0–17.0)
MCH: 31.5 pg (ref 26.0–34.0)
MCHC: 33.6 g/dL (ref 30.0–36.0)
MCV: 94.1 fL (ref 78.0–100.0)
MCV: 95.4 fL (ref 78.0–100.0)
RDW: 15 % (ref 11.5–15.5)
RDW: 15.2 % (ref 11.5–15.5)
WBC: 9.3 10*3/uL (ref 4.0–10.5)

## 2011-04-18 LAB — PREPARE PLATELET PHERESIS: Unit division: 0

## 2011-04-18 NOTE — Progress Notes (Signed)
General:  Nathan Kemp  Did good during the  Night, denies any itching ,nausea or weakness during the IVIG threapy , tolerated the IVIG without any problems. Vitals signs remains  WNL,  pt states no needs at this time. Cindra Eves RN,

## 2011-04-18 NOTE — Progress Notes (Signed)
Progress Note:  Subjective: Overall he feels better. He has completed a course of IVIG and is still receiving parenteral steroids with Solu-Medrol IV 100 mg every 12 hours. Grossly melanotic stools have stopped. He had one bowel movement that was formed late yesterday but still dark. He received an additional platelet transfusion yesterday. Platelet count this morning is 54,000. In the globin slightly less down from 8 to 7.5. He notes no other sites of bleeding. He denies any headache or change in vision. No epistaxis.   Vitals: Filed Vitals:   04/18/11 0553  BP: 120/71  Pulse: 56  Temp: 97.9 F (36.6 C)  Resp: 20   Wt Readings from Last 3 Encounters:  04/15/11 202 lb (91.627 kg)  04/15/11 202 lb 1.6 oz (91.672 kg)  03/29/11 202 lb 8 oz (91.853 kg)     PHYSICAL EXAM:  General alert and pleasant  Head: Normal Eyes: Normal Throat: No erythema exudate or bleeding Neck: Full range of motion Lymph Nodes: Approximate 2 cm right posterior cervical node previously noted and now smaller according to the patient Resp: Rales at the lung bases Breasts: Not examined Cardio: Regular cardiac rhythm no murmur GI: Abdomen soft nontender no mass; spleen enlarged about 8 cm below left costal margin Extremities: 1+ ankle edema no calf tenderness Vascular:  Not examined Neurologic pupils equal reactive to light motor strength 5 over 5 reflexes absent symmetric at the knees 1+ symmetric at the biceps Skin: Fading petechial rash lower extremities  Labs:   [facility] 04/18/11 0342 04/17/11 1540  WBC 9.3 9.0  HGB 7.5* 8.0*  HCT 22.7* 24.1*  PLT 54* 42*    Basename 04/17/11 0327 04/16/11 1010  NA 133* 130*  K 4.0 4.2  CL 104 100  CO2 22 23  GLUCOSE 205* 146*  BUN 28* 30*  CREATININE 0.75 0.72  CALCIUM 8.7 8.9      Images Studies/Results:   Dg Chest 2 View  04/16/2011  *RADIOLOGY REPORT*  Clinical Data: Idiopathic thrombocytopenia.  CHEST - 2 VIEW  Comparison: 01/28/2005   Findings: The heart size and vascularity are normal and the lungs are clear.  No effusions.  No significant osseous abnormality.  IMPRESSION: Normal chest.  Original Report Authenticated By: Gwynn Burly, M.D.     Patient Active Problem List  Diagnoses  . HYPERLIPIDEMIA  . HYPERTENSION  . COUGH  . HYPOTHYROIDISM  . SPLENOMEGALY  . HEPATOMEGALY, HX OF  . FEVER, HX OF  . CAD (coronary artery disease)  . Lymphadenopathy, neck  . Thrombocytopenia, unspecified  . Skin cancer    Assessment and Plan:  #1. 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 We'll need to monitor trend over the next few days. #2. GI bleed related to thrombocytopenia. Stabilized on treatment outlined above and with the platelet transfusion #3. Lymphadenopathy and splenomegaly. Previous nondiagnostic right inguinal lymph node biopsy and bone marrow biopsies. There is still a suspicion that he may have an underlying low-grade lymphoma. Once it is clear that he is responding to current therapy he will be switched from IV to oral steroids and discharge.    GRANFORTUNA,JAMES M 04/18/2011, 9:16 AM

## 2011-04-18 NOTE — Progress Notes (Signed)
Pt reports BM & saved for staff. Burgandy stool. Water in toilet not discolored.

## 2011-04-19 ENCOUNTER — Other Ambulatory Visit: Payer: Self-pay | Admitting: Oncology

## 2011-04-19 ENCOUNTER — Encounter (HOSPITAL_COMMUNITY): Payer: Self-pay | Admitting: Oncology

## 2011-04-19 DIAGNOSIS — K922 Gastrointestinal hemorrhage, unspecified: Secondary | ICD-10-CM

## 2011-04-19 DIAGNOSIS — D696 Thrombocytopenia, unspecified: Secondary | ICD-10-CM

## 2011-04-19 LAB — CBC
HCT: 23.8 % — ABNORMAL LOW (ref 39.0–52.0)
Hemoglobin: 7.9 g/dL — ABNORMAL LOW (ref 13.0–17.0)
MCH: 32 pg (ref 26.0–34.0)
MCHC: 33.2 g/dL (ref 30.0–36.0)
RDW: 15.3 % (ref 11.5–15.5)

## 2011-04-19 LAB — HAPTOGLOBIN: Haptoglobin: 197 mg/dL (ref 30–200)

## 2011-04-19 MED ORDER — PREDNISONE 20 MG PO TABS: 40.0000 mg | ORAL_TABLET | Freq: Two times a day (BID) | ORAL | Status: DC

## 2011-04-19 MED ORDER — PREDNISONE 20 MG PO TABS
40.0000 mg | ORAL_TABLET | Freq: Two times a day (BID) | ORAL | Status: DC
  Administered 2011-04-19: 40 mg via ORAL
  Filled 2011-04-19 (×2): qty 2

## 2011-04-19 NOTE — Progress Notes (Signed)
Discharge instructions and prescription given.  No questions asked, verbalized understanding.  Left via wheelchair with wife.

## 2011-04-19 NOTE — Discharge Summary (Signed)
Physician Discharge Summary  Patient ID: Nathan Kemp @ATTENDINGNPI @ MRN: [id number] DOB/AGE: 1940-10-10 71 y.o.  Admit date: 04/15/2011 Discharge date: 04/19/2011  Admission Diagnoses: Severe thrombocytopenia.   Hospital Course:  71 year old Caucasian man who has been under evaluation since the spring of 2011 for a systemic illness characterized by fever, malaise, weight loss, lymphadenopathy, splenomegaly. He had moderate thrombocytopenia due today splenomegaly. He had initial liver function abnormalities. He has been extensively evaluated locally and out-of-state at the [facility]. He has had a number of biopsies including lymph node biopsies, bone marrow biopsies, liver biopsies. He remains without a definitive diagnosis although suspicion for a low-grade non-Hodgkin's lymphoma is high.  He presented on the day of the current admission to his dermatologist with what was found to be a diffuse petechial rash. The dermatologist contacted his primary hematologist Dr. Truett Perna. Lab studies were done and he was found to have profound thrombocytopenia with a platelet count less than 6000. Hemoglobin 10.2. White count 5300. Review of the peripheral blood film showed marked decrease in the platelets. No schistocytes. He was admitted for further evaluation.  He was started on a course of parenteral steroids with is Solu-Medrol 100 mg IV every 12 hours and intravenous immunoglobulin 1 g per kilogram daily x2 doses. While under treatment he developed grossly melanotic stools. Hemoglobin fell from admission of value of 10.2 to a nadir value of 7.5 on 12/31. He was given platelet transfusions daily x2. Fortunately, he had a prompt response to treatment. Following the first platelet transfusion and platelets rose to 29,000 on 12/30. Further increase from 29,000-54,000 x 12/31. Without additional platelet transfusions platelet count rose to 88,000 on the day of discharge 04/19/11. Given clear response to  treatment at that point, parenteral steroids were stopped and he was transitioned to oral steroids. He will be discharged on 80 mg of prednisone daily in 2 divided doses.  Despite underlying coronary disease, he did not develop any anginal symptoms when his hemoglobin fell. Melena resolved with rising platelet count and temporary platelet transfusion support.  He had a recent repeat right cervical lymph node biopsy. 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  Condition is stable at time of discharge. Melena has resolved. Hemoglobin has stabilized and is up from 7.5 to 7.9 g. As stated above, platelet count at discharge is 88,000.  There were no other complications.   Discharge Labs:   Lab 04/19/11 0357 04/18/11 0342 04/17/11 1540 04/16/11 1440 04/16/11 0320 04/15/11 1530  WBC 11.7* 9.3 9.0 -- -- --  HGB 7.9* 7.5* 8.0* -- -- --  HCT 23.8* 22.7* 24.1* -- -- --  PLT 88* 54* 42* -- -- --  NEUTOPHILPCT -- -- -- 64 60 39.2  MONOPCT -- -- -- 14* 14* 42.6*    Lab 04/17/11 0327 04/16/11 1010 04/15/11 1929  NA 133* 130* 134*  K 4.0 4.2 4.2  CL 104 100 100  CO2 22 23 25   BUN 28* 30* 30*  CREATININE 0.75 0.72 0.99  CALCIUM 8.7 8.9 9.2  PROT 9.0* 8.9* 7.9  BILITOT 0.4 0.4 0.6  ALKPHOS 137* 150* 166*  ALT 27 31 36  AST 17 18 26   GLUCOSE 205* 146* 96       Consults: Treatment Team:  Levert Feinstein, MD   Procedures:  #1  platelet transfusions. #2. Administration of systemic steroids and intravenous immunoglobulin.   Discharge Diagnoses:  #1. Acute immune thrombocytopenia. #2. Upper GI bleed secondary to severe thrombocytopenia. #3  anemia secondary to #2. #4. Idiopathic lymphoproliferative disorder. #5. Lymphadenopathy and splenomegaly secondary to #4. #6. Hypothyroidism on replacement. #7. Coronary artery disease with previous coronary stent. #8. Essential hypertension. #9. Glaucoma. #10. Hyperlipidemia. #11. Asthma. #12. Type 2  diabetes diet-controlled.        SignedLevert Feinstein 04/19/2011, 10:49 AM

## 2011-04-20 LAB — PROTEIN ELECTROPHORESIS, SERUM
Alpha-2-Globulin: 9.1 % (ref 7.1–11.8)
Gamma Globulin: 40.7 % — ABNORMAL HIGH (ref 11.1–18.8)
M-Spike, %: NOT DETECTED g/dL
Total Protein ELP: 8.7 g/dL — ABNORMAL HIGH (ref 6.0–8.3)

## 2011-04-20 NOTE — Progress Notes (Signed)
This encounter was created in error - please disregard.

## 2011-04-21 LAB — UIFE/LIGHT CHAINS/TP QN, 24-HR UR
Albumin, U: DETECTED
Beta, Urine: DETECTED — AB
Free Kappa Lt Chains,Ur: 64.7 mg/dL — ABNORMAL HIGH (ref 0.14–2.42)
Free Lambda Lt Chains,Ur: 7.72 mg/dL — ABNORMAL HIGH (ref 0.02–0.67)
Gamma Globulin, Urine: DETECTED — AB

## 2011-04-22 ENCOUNTER — Telehealth: Payer: Self-pay | Admitting: Oncology

## 2011-04-22 ENCOUNTER — Other Ambulatory Visit: Payer: Self-pay | Admitting: *Deleted

## 2011-04-22 DIAGNOSIS — D696 Thrombocytopenia, unspecified: Secondary | ICD-10-CM

## 2011-04-22 NOTE — Telephone Encounter (Signed)
lmonvm adviisng the pt of his lab appt on 04/25/2011

## 2011-04-22 NOTE — Telephone Encounter (Signed)
Pt's wife called in to state that the pt was in the hospital and was seen by dr Cyndie Chime who wants this pt to have a lab appt this Monday. lmonvm of dr sherrill's desk nurse regarding this message. Pt's wife is expecting a callback

## 2011-04-25 ENCOUNTER — Encounter: Payer: Self-pay | Admitting: *Deleted

## 2011-04-25 ENCOUNTER — Other Ambulatory Visit: Payer: Self-pay | Admitting: Oncology

## 2011-04-25 ENCOUNTER — Telehealth: Payer: Self-pay | Admitting: *Deleted

## 2011-04-25 ENCOUNTER — Telehealth: Payer: Self-pay | Admitting: Oncology

## 2011-04-25 ENCOUNTER — Other Ambulatory Visit: Payer: Self-pay | Admitting: *Deleted

## 2011-04-25 ENCOUNTER — Other Ambulatory Visit (HOSPITAL_BASED_OUTPATIENT_CLINIC_OR_DEPARTMENT_OTHER): Payer: Medicare Other | Admitting: Lab

## 2011-04-25 DIAGNOSIS — D696 Thrombocytopenia, unspecified: Secondary | ICD-10-CM

## 2011-04-25 LAB — MANUAL DIFFERENTIAL
Band Neutrophils: 1 % (ref 0–10)
Basophil: 0 % (ref 0–2)
EOS: 1 % (ref 0–7)
MONO: 23 % — ABNORMAL HIGH (ref 0–14)
Other Cell: 0 % (ref 0–0)
SEG: 57 % (ref 38–77)
nRBC: 1 % — ABNORMAL HIGH (ref 0–0)

## 2011-04-25 LAB — CBC WITH DIFFERENTIAL/PLATELET
HGB: 10.5 g/dL — ABNORMAL LOW (ref 13.0–17.1)
MCH: 31.8 pg (ref 27.2–33.4)
MCV: 96.3 fL (ref 79.3–98.0)
Platelets: 112 10*3/uL — ABNORMAL LOW (ref 140–400)
RBC: 3.3 10*6/uL — ABNORMAL LOW (ref 4.20–5.82)
WBC: 16.3 10*3/uL — ABNORMAL HIGH (ref 4.0–10.3)

## 2011-04-25 NOTE — Telephone Encounter (Signed)
spoke with pts wife and she is aware of new lab appt for 1/14 at 8:45 per 1/7 order   aom

## 2011-04-25 NOTE — Telephone Encounter (Signed)
Per Dr. Truett Perna: Decrease Prednisone to 60 mg daily. Recheck counts in 1 week. Wife notified. She reports all signs of petechiae have resolved and he is feeling better.

## 2011-04-26 ENCOUNTER — Telehealth: Payer: Self-pay | Admitting: *Deleted

## 2011-04-26 NOTE — Telephone Encounter (Signed)
Wife asking what Hgb result was yesterday and for copy of labs to be put at front desk for her to pick up on Thursday.

## 2011-04-28 DIAGNOSIS — I700 Atherosclerosis of aorta: Secondary | ICD-10-CM | POA: Diagnosis not present

## 2011-04-28 DIAGNOSIS — D739 Disease of spleen, unspecified: Secondary | ICD-10-CM | POA: Diagnosis not present

## 2011-04-28 DIAGNOSIS — D696 Thrombocytopenia, unspecified: Secondary | ICD-10-CM | POA: Diagnosis not present

## 2011-04-28 DIAGNOSIS — Z8673 Personal history of transient ischemic attack (TIA), and cerebral infarction without residual deficits: Secondary | ICD-10-CM | POA: Diagnosis not present

## 2011-04-28 DIAGNOSIS — E039 Hypothyroidism, unspecified: Secondary | ICD-10-CM | POA: Diagnosis not present

## 2011-04-28 DIAGNOSIS — R599 Enlarged lymph nodes, unspecified: Secondary | ICD-10-CM | POA: Diagnosis not present

## 2011-04-28 DIAGNOSIS — K7689 Other specified diseases of liver: Secondary | ICD-10-CM | POA: Diagnosis not present

## 2011-04-28 DIAGNOSIS — I2510 Atherosclerotic heart disease of native coronary artery without angina pectoris: Secondary | ICD-10-CM | POA: Diagnosis not present

## 2011-04-28 DIAGNOSIS — I10 Essential (primary) hypertension: Secondary | ICD-10-CM | POA: Diagnosis not present

## 2011-04-28 DIAGNOSIS — R05 Cough: Secondary | ICD-10-CM | POA: Diagnosis not present

## 2011-04-28 DIAGNOSIS — R059 Cough, unspecified: Secondary | ICD-10-CM | POA: Diagnosis not present

## 2011-04-28 DIAGNOSIS — K219 Gastro-esophageal reflux disease without esophagitis: Secondary | ICD-10-CM | POA: Diagnosis not present

## 2011-04-28 DIAGNOSIS — E119 Type 2 diabetes mellitus without complications: Secondary | ICD-10-CM | POA: Diagnosis not present

## 2011-05-02 ENCOUNTER — Other Ambulatory Visit (HOSPITAL_BASED_OUTPATIENT_CLINIC_OR_DEPARTMENT_OTHER): Payer: Medicare Other | Admitting: Lab

## 2011-05-02 ENCOUNTER — Other Ambulatory Visit: Payer: Self-pay | Admitting: Oncology

## 2011-05-02 DIAGNOSIS — D696 Thrombocytopenia, unspecified: Secondary | ICD-10-CM

## 2011-05-02 LAB — MANUAL DIFFERENTIAL
Band Neutrophils: 1 % (ref 0–10)
Basophil: 0 % (ref 0–2)
EOS: 0 % (ref 0–7)
Metamyelocytes: 0 % (ref 0–0)
Myelocytes: 0 % (ref 0–0)
PLT EST: DECREASED
PROMYELO: 0 % (ref 0–0)
nRBC: 0 % (ref 0–0)

## 2011-05-02 LAB — CBC WITH DIFFERENTIAL/PLATELET
MCH: 31 pg (ref 27.2–33.4)
MCHC: 31.7 g/dL — ABNORMAL LOW (ref 32.0–36.0)
MCV: 97.5 fL (ref 79.3–98.0)
RBC: 3.65 10*6/uL — ABNORMAL LOW (ref 4.20–5.82)
RDW: 17 % — ABNORMAL HIGH (ref 11.0–14.6)

## 2011-05-03 ENCOUNTER — Telehealth: Payer: Self-pay | Admitting: *Deleted

## 2011-05-03 DIAGNOSIS — D696 Thrombocytopenia, unspecified: Secondary | ICD-10-CM

## 2011-05-03 NOTE — Telephone Encounter (Signed)
MD review of CBC results--continue Prednisone 60 mg daily. Recheck counts in 1 week. Wife reports some GI discomfort on intermittent basis. She has started to give him Zantac OTC. Was seen by Dr. Vira Browns at [facility] last week regarding his chronic cough--she feels it could be due to scar tissue. Had CT scan lungs (no results yet). Returns on 05/17/11 for PFT's and to see MD.

## 2011-05-04 ENCOUNTER — Telehealth: Payer: Self-pay | Admitting: *Deleted

## 2011-05-04 NOTE — Telephone Encounter (Signed)
Wife asked if OK for flu vaccine? OK per Dr. Truett Perna, just may not be as effective since he is on Prednisone. They will get vaccine at PCP. Wife reports his GI distress (stomach pain) is getting worse despite Zantac OTC. Would prescription antacid be better while he is on Prednisone?--[facility]

## 2011-05-05 ENCOUNTER — Ambulatory Visit (HOSPITAL_BASED_OUTPATIENT_CLINIC_OR_DEPARTMENT_OTHER): Payer: Medicare Other

## 2011-05-05 ENCOUNTER — Other Ambulatory Visit: Payer: Self-pay | Admitting: *Deleted

## 2011-05-05 ENCOUNTER — Telehealth: Payer: Self-pay | Admitting: Oncology

## 2011-05-05 DIAGNOSIS — R599 Enlarged lymph nodes, unspecified: Secondary | ICD-10-CM

## 2011-05-05 DIAGNOSIS — C4490 Unspecified malignant neoplasm of skin, unspecified: Secondary | ICD-10-CM | POA: Diagnosis not present

## 2011-05-05 DIAGNOSIS — R591 Generalized enlarged lymph nodes: Secondary | ICD-10-CM

## 2011-05-05 DIAGNOSIS — D696 Thrombocytopenia, unspecified: Secondary | ICD-10-CM

## 2011-05-05 DIAGNOSIS — Z23 Encounter for immunization: Secondary | ICD-10-CM

## 2011-05-05 DIAGNOSIS — R12 Heartburn: Secondary | ICD-10-CM

## 2011-05-05 DIAGNOSIS — H2510 Age-related nuclear cataract, unspecified eye: Secondary | ICD-10-CM | POA: Diagnosis not present

## 2011-05-05 MED ORDER — PANTOPRAZOLE SODIUM 40 MG PO TBEC: 40.0000 mg | DELAYED_RELEASE_TABLET | Freq: Every day | ORAL | Status: DC

## 2011-05-05 MED ORDER — INFLUENZA VIRUS VACC SPLIT PF IM SUSP
0.5000 mL | Freq: Once | INTRAMUSCULAR | Status: DC
  Filled 2011-05-05: qty 0.5

## 2011-05-05 MED ORDER — INFLUENZA VIRUS VACC SPLIT PF IM SUSP
0.5000 mL | Freq: Once | INTRAMUSCULAR | Status: AC
  Administered 2011-05-05: 0.5 mL via INTRAMUSCULAR
  Filled 2011-05-05: qty 0.5

## 2011-05-05 NOTE — Telephone Encounter (Signed)
called pts wife and scheduled flu shot today @ 4:15 AND LAB APPT FOR 01/21

## 2011-05-05 NOTE — Telephone Encounter (Signed)
Per Dr. Truett Perna: Try Protonix 40 mg po daily for heartburn symptoms. Call for increased pain. Notified wife via voice mail.

## 2011-05-09 ENCOUNTER — Other Ambulatory Visit: Payer: Medicare Other | Admitting: Lab

## 2011-05-09 DIAGNOSIS — D696 Thrombocytopenia, unspecified: Secondary | ICD-10-CM

## 2011-05-09 LAB — MANUAL DIFFERENTIAL
ANC (CHCC manual diff): 6.4 10*3/uL (ref 1.5–6.5)
Band Neutrophils: 0 % (ref 0–10)
Basophil: 0 % (ref 0–2)
Blasts: 0 % (ref 0–0)
Metamyelocytes: 1 % — ABNORMAL HIGH (ref 0–0)
PROMYELO: 0 % (ref 0–0)
nRBC: 0 % (ref 0–0)

## 2011-05-09 LAB — CBC WITH DIFFERENTIAL/PLATELET
HCT: 34.9 % — ABNORMAL LOW (ref 38.4–49.9)
HGB: 11.8 g/dL — ABNORMAL LOW (ref 13.0–17.1)
MCH: 32.1 pg (ref 27.2–33.4)
MCHC: 33.7 g/dL (ref 32.0–36.0)
MCV: 95.4 fL (ref 79.3–98.0)
Platelets: 106 10e3/uL — ABNORMAL LOW (ref 140–400)
RBC: 3.66 10e6/uL — ABNORMAL LOW (ref 4.20–5.82)
RDW: 16.9 % — ABNORMAL HIGH (ref 11.0–14.6)
WBC: 12.6 10e3/uL — ABNORMAL HIGH (ref 4.0–10.3)

## 2011-05-10 ENCOUNTER — Other Ambulatory Visit: Payer: Self-pay | Admitting: *Deleted

## 2011-05-10 ENCOUNTER — Telehealth: Payer: Self-pay | Admitting: *Deleted

## 2011-05-10 DIAGNOSIS — D696 Thrombocytopenia, unspecified: Secondary | ICD-10-CM

## 2011-05-10 MED ORDER — PREDNISONE 20 MG PO TABS: 40.0000 mg | ORAL_TABLET | Freq: Every day | ORAL | Status: DC

## 2011-05-10 NOTE — Telephone Encounter (Signed)
Made wife aware that per MD--decrease Prednisone to 40 mg daily and recheck in 1 week.

## 2011-05-11 ENCOUNTER — Encounter (INDEPENDENT_AMBULATORY_CARE_PROVIDER_SITE_OTHER): Payer: Self-pay

## 2011-05-11 ENCOUNTER — Telehealth: Payer: Self-pay | Admitting: Oncology

## 2011-05-11 NOTE — Telephone Encounter (Signed)
called pts home s/w wife and scheduled lab appt for 01/28

## 2011-05-12 ENCOUNTER — Encounter (INDEPENDENT_AMBULATORY_CARE_PROVIDER_SITE_OTHER): Payer: Self-pay

## 2011-05-16 ENCOUNTER — Other Ambulatory Visit (HOSPITAL_BASED_OUTPATIENT_CLINIC_OR_DEPARTMENT_OTHER): Payer: Medicare Other | Admitting: Lab

## 2011-05-16 DIAGNOSIS — D696 Thrombocytopenia, unspecified: Secondary | ICD-10-CM

## 2011-05-16 LAB — CBC WITH DIFFERENTIAL/PLATELET
Basophils Absolute: 0 10*3/uL (ref 0.0–0.1)
Eosinophils Absolute: 0.1 10*3/uL (ref 0.0–0.5)
HCT: 35.1 % — ABNORMAL LOW (ref 38.4–49.9)
HGB: 11.9 g/dL — ABNORMAL LOW (ref 13.0–17.1)
LYMPH%: 18.3 % (ref 14.0–49.0)
MCV: 94.4 fL (ref 79.3–98.0)
MONO%: 16.4 % — ABNORMAL HIGH (ref 0.0–14.0)
NEUT#: 8.7 10*3/uL — ABNORMAL HIGH (ref 1.5–6.5)
NEUT%: 64.4 % (ref 39.0–75.0)
Platelets: 105 10*3/uL — ABNORMAL LOW (ref 140–400)

## 2011-05-17 ENCOUNTER — Telehealth: Payer: Self-pay | Admitting: *Deleted

## 2011-05-17 DIAGNOSIS — D696 Thrombocytopenia, unspecified: Secondary | ICD-10-CM | POA: Diagnosis not present

## 2011-05-17 DIAGNOSIS — E039 Hypothyroidism, unspecified: Secondary | ICD-10-CM | POA: Diagnosis not present

## 2011-05-17 DIAGNOSIS — R059 Cough, unspecified: Secondary | ICD-10-CM | POA: Diagnosis not present

## 2011-05-17 DIAGNOSIS — R05 Cough: Secondary | ICD-10-CM | POA: Diagnosis not present

## 2011-05-17 DIAGNOSIS — I10 Essential (primary) hypertension: Secondary | ICD-10-CM | POA: Diagnosis not present

## 2011-05-17 DIAGNOSIS — I2510 Atherosclerotic heart disease of native coronary artery without angina pectoris: Secondary | ICD-10-CM | POA: Diagnosis not present

## 2011-05-17 DIAGNOSIS — Z79899 Other long term (current) drug therapy: Secondary | ICD-10-CM | POA: Diagnosis not present

## 2011-05-17 DIAGNOSIS — E119 Type 2 diabetes mellitus without complications: Secondary | ICD-10-CM | POA: Diagnosis not present

## 2011-05-17 DIAGNOSIS — K219 Gastro-esophageal reflux disease without esophagitis: Secondary | ICD-10-CM | POA: Diagnosis not present

## 2011-05-17 NOTE — Telephone Encounter (Signed)
Per Dr. Truett Perna: Taper to 30 mg daily of Prednsione. Recheck on 2/5 visit. Patient notified via home voice mail.

## 2011-05-20 DIAGNOSIS — E291 Testicular hypofunction: Secondary | ICD-10-CM | POA: Diagnosis not present

## 2011-05-20 DIAGNOSIS — R7309 Other abnormal glucose: Secondary | ICD-10-CM | POA: Diagnosis not present

## 2011-05-20 DIAGNOSIS — N529 Male erectile dysfunction, unspecified: Secondary | ICD-10-CM | POA: Diagnosis not present

## 2011-05-20 DIAGNOSIS — R350 Frequency of micturition: Secondary | ICD-10-CM | POA: Diagnosis not present

## 2011-05-23 ENCOUNTER — Telehealth: Payer: Self-pay | Admitting: Oncology

## 2011-05-23 DIAGNOSIS — L570 Actinic keratosis: Secondary | ICD-10-CM | POA: Diagnosis not present

## 2011-05-23 NOTE — Telephone Encounter (Signed)
called pts home lmovm that his appt on 2-05 was moved to 02-06 and to rtn call to confirm appt change

## 2011-05-24 ENCOUNTER — Other Ambulatory Visit: Payer: Medicare Other | Admitting: Lab

## 2011-05-24 ENCOUNTER — Encounter (INDEPENDENT_AMBULATORY_CARE_PROVIDER_SITE_OTHER): Payer: Self-pay

## 2011-05-24 ENCOUNTER — Ambulatory Visit: Payer: Medicare Other | Admitting: Oncology

## 2011-05-24 DIAGNOSIS — I10 Essential (primary) hypertension: Secondary | ICD-10-CM | POA: Diagnosis not present

## 2011-05-24 DIAGNOSIS — E119 Type 2 diabetes mellitus without complications: Secondary | ICD-10-CM | POA: Diagnosis not present

## 2011-05-24 DIAGNOSIS — E039 Hypothyroidism, unspecified: Secondary | ICD-10-CM | POA: Diagnosis not present

## 2011-05-24 DIAGNOSIS — K219 Gastro-esophageal reflux disease without esophagitis: Secondary | ICD-10-CM | POA: Diagnosis not present

## 2011-05-25 ENCOUNTER — Other Ambulatory Visit: Payer: Medicare Other | Admitting: Lab

## 2011-05-25 ENCOUNTER — Ambulatory Visit (HOSPITAL_BASED_OUTPATIENT_CLINIC_OR_DEPARTMENT_OTHER): Payer: Medicare Other | Admitting: Oncology

## 2011-05-25 ENCOUNTER — Other Ambulatory Visit: Payer: Self-pay | Admitting: Oncology

## 2011-05-25 DIAGNOSIS — R161 Splenomegaly, not elsewhere classified: Secondary | ICD-10-CM

## 2011-05-25 DIAGNOSIS — E039 Hypothyroidism, unspecified: Secondary | ICD-10-CM | POA: Diagnosis not present

## 2011-05-25 DIAGNOSIS — D6949 Other primary thrombocytopenia: Secondary | ICD-10-CM

## 2011-05-25 DIAGNOSIS — R591 Generalized enlarged lymph nodes: Secondary | ICD-10-CM

## 2011-05-25 DIAGNOSIS — N529 Male erectile dysfunction, unspecified: Secondary | ICD-10-CM

## 2011-05-25 LAB — TESTOSTERONE: Testosterone: 92.01 ng/dL — ABNORMAL LOW (ref 250–890)

## 2011-05-25 LAB — CBC WITH DIFFERENTIAL/PLATELET
BASO%: 0.2 % (ref 0.0–2.0)
LYMPH%: 19.7 % (ref 14.0–49.0)
MCHC: 33.9 g/dL (ref 32.0–36.0)
MCV: 94.1 fL (ref 79.3–98.0)
MONO%: 6.6 % (ref 0.0–14.0)
Platelets: 91 10*3/uL — ABNORMAL LOW (ref 140–400)
RBC: 3.85 10*6/uL — ABNORMAL LOW (ref 4.20–5.82)

## 2011-05-25 LAB — BASIC METABOLIC PANEL
BUN: 25 mg/dL — ABNORMAL HIGH (ref 6–23)
Chloride: 98 mEq/L (ref 96–112)
Creatinine, Ser: 0.89 mg/dL (ref 0.50–1.35)
Glucose, Bld: 129 mg/dL — ABNORMAL HIGH (ref 70–99)

## 2011-05-25 NOTE — Progress Notes (Signed)
OFFICE PROGRESS NOTE   INTERVAL HISTORY:   He was admitted on 04/15/2011 with severe thrombocytopenia. He was treated with IVIG and steroids. The platelet count has improved. He has continued a prednisone taper over the past one month. The petechial rash has resolved.  Approximately 1 week ago he developed polyuria and polydipsia. He saw Dr. Isabel Caprice on 05/20/2011 and the hemoglobin A1c returned elevated at 10.4%. He was referred to Dr. Jacky Kindle on 05/24/2011 and reports the blood sugar return to greater than 600. He was started on insulin. The blood sugar is down to 200 today. He reports resolution of the polyuria. He also noted "cramping" in multiple muscle groups last week.  Objective:  Vital signs in last 24 hours:  Blood pressure 130/66, pulse 85, temperature 98.5 F (36.9 C), temperature source Oral, height 5\' 9"  (1.753 m), weight 195 lb 9.6 oz (88.724 kg).    HEENT: No thrush or ulcers Lymphatics: 1/2 cm right posterior cervical node, smaller left posterior cervical node. No other cervical, supraclavicular, axillary, or inguinal nodes Resp: Lungs clear bilaterally Cardio: Regular rate and rhythm GI: No hepatosplenomegaly Vascular: Trace right greater than left pretibial edema  Skin: Brown discoloration at the lower leg bilaterally. No petechiae.   Lab Results:  Lab Results  Component Value Date   WBC 10.6* 05/25/2011   HGB 12.3* 05/25/2011   HCT 36.2* 05/25/2011   MCV 94.1 05/25/2011   PLT 91* 05/25/2011      Medications: I have reviewed the patient's current medications.  Assessment/Plan: 1. History of splenomegaly - the spleen has been palpable on intermittent office visits over the past year. Splenomegaly was confirmed on an abdominal ultrasound 06/14/2010. 2. Elevated liver enzymes - persistent. 3. Pancytopenia  4. History Systemic illness characterized by intermittent fever, weight loss, and malaise-improved 5. History of coronary artery disease. 6. Bone marrow biopsy  11/27/2009 at the [facility] and on 12/19/2009 at [facility] confirmed a hypercellular marrow with mild reticulin fibrosis and no evidence of malignancy. 7. Liver biopsy 02/10/2010 revealed minimal portal and lobular inflammation with evidence of steatosis. 8. Excisional biopsy of a right inguinal lymph node on 02/19/2010 at [facility] with the pathology revealing no evidence of a malignancy. 9. Hypothyroidism - maintained on thyroid hormone replacement. 10. Seroma at the right inguinal lymph node biopsy site - improved. 11. Erectile dysfunction - the testosterone level was low 05/13/2010. 12. Hepatomegaly on the abdominal ultrasound 06/14/2010. 13. Skin hyperpigmentation over the extremities -  14. 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 He continues a prednisone taper. 16. 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 I suspect the thrombocytopenia is due to ITP in the setting of an underlying low grade lymphoproliferative disorder. The thrombocytopenia has responded to steroids. We taper the prednisone to 20 mg daily. He will return for a CBC in 2 weeks and 4 weeks. He is scheduled for an office visit in 6 weeks.  He'll continue followup with Dr. Jacky Kindle for management of diabetes.  We will refer him for a splenectomy or begin a trial of Rituxan if the platelet count falls within the prednisone  is tapered to off.   Lucile Shutters, MD  05/25/2011  7:10 PM

## 2011-05-26 ENCOUNTER — Ambulatory Visit (HOSPITAL_BASED_OUTPATIENT_CLINIC_OR_DEPARTMENT_OTHER): Payer: Medicare Other | Admitting: Nutrition

## 2011-05-26 NOTE — Assessment & Plan Note (Signed)
I received a request from Dr. Kalman Drape nurse to call Nathan Kemp at home regarding his new diagnosis of diabetes.  I spoke with Nathan Kemp on the phone per her husband's request.  She reports that the patient has always been "borderline diabetic"; however, after treatment with prednisone, his blood sugars increased to the 600s.  He is now on insulin and they were looking for clarification on the diabetic handouts provided by nursing.  MEDICAL HISTORY INCLUDES:  Hypertension, hyperlipidemia, vitamin D deficiency, obesity, hypothyroidism, CAD and diabetes.  MEDICATIONS INCLUDE:  Lantus, Humulin, Synthroid, Protonix, prednisone, zinc.  LABS:  Include a glucose of 129 on 02/06.  HEIGHT:  69 inches. WEIGHT:  195.6 pounds. BMI:  28.87.  NUTRITION DIAGNOSIS:  Food and nutrition related knowledge deficit related to new diagnosis of diabetes secondary to steroid treatment as evidenced by elevated blood sugar levels of approximately 600 per patient's family.  INTERVENTION:  I have briefly educated patient's wife on simple carbohydrate counting.  She has some basic education that was printed out for her by nursing so she is familiar with carbohydrate-containing foods.  I encouraged her to monitor patient's portion sizes, to be sure that they are using whole grains breads and cereals and pastas and that they are monitoring how much of these foods patient eats, as well as decreasing simple sugars and choosing artificially sweetened beverages. I have encouraged patient's wife to begin reading labels to determine how many grams of carbohydrate are in each serving of food she is providing.  I answered some basic questions and I have encouraged patient's wife to have the patient attend diabetes education at the [facility].  MONITORING/EVALUATION (GOALS):  Patient will modify his current diet to include more complex carbohydrates and less concentrated sweets to achieve improved  glycemic control.  He will also attend education classes at [facility].  NEXT VISIT:  There is no followup scheduled with me as patient is going to ask for a referral to [facility].  Patient's wife knows she can call me for questions if needed.  My contact information was provided.    ______________________________ Zenovia Jarred, RD, LDN Clinical Nutrition Specialist BN/MEDQ  D:  05/26/2011  T:  05/26/2011  Job:  [id number]

## 2011-05-27 ENCOUNTER — Telehealth: Payer: Self-pay | Admitting: *Deleted

## 2011-05-27 ENCOUNTER — Other Ambulatory Visit: Payer: Self-pay | Admitting: *Deleted

## 2011-05-27 NOTE — Telephone Encounter (Signed)
Pt's wife notified. Copy left at front desk as requested.

## 2011-05-27 NOTE — Telephone Encounter (Signed)
Message copied by Caleb Popp on Fri May 27, 2011  6:15 PM ------      Message from: Thornton Papas B      Created: Thu May 26, 2011  8:40 PM       Please call patient, tsh is normal, testosterone is low            We added bmet.  Was this done?

## 2011-05-31 ENCOUNTER — Telehealth: Payer: Self-pay | Admitting: *Deleted

## 2011-05-31 DIAGNOSIS — D696 Thrombocytopenia, unspecified: Secondary | ICD-10-CM | POA: Diagnosis not present

## 2011-05-31 DIAGNOSIS — E119 Type 2 diabetes mellitus without complications: Secondary | ICD-10-CM | POA: Diagnosis not present

## 2011-05-31 DIAGNOSIS — I10 Essential (primary) hypertension: Secondary | ICD-10-CM | POA: Diagnosis not present

## 2011-05-31 NOTE — Telephone Encounter (Signed)
Wife called to clarify Prednisone dose as 20 mg daily and to continue this till next lab visit? Also requests copy of his office labs since he was hospitalized. Should she call medical records for this?

## 2011-05-31 NOTE — Telephone Encounter (Signed)
Confirmed Prednisone at 20 mg daily till next lab and will forward request to medical records for labs. Instructed her to call medical records department in future when she needs copies of labs or x rays.

## 2011-06-06 ENCOUNTER — Other Ambulatory Visit (HOSPITAL_BASED_OUTPATIENT_CLINIC_OR_DEPARTMENT_OTHER): Payer: Medicare Other

## 2011-06-06 DIAGNOSIS — D6949 Other primary thrombocytopenia: Secondary | ICD-10-CM

## 2011-06-06 DIAGNOSIS — L52 Erythema nodosum: Secondary | ICD-10-CM | POA: Diagnosis not present

## 2011-06-06 DIAGNOSIS — L539 Erythematous condition, unspecified: Secondary | ICD-10-CM | POA: Diagnosis not present

## 2011-06-06 LAB — CBC WITH DIFFERENTIAL/PLATELET
BASO%: 0.3 % (ref 0.0–2.0)
Eosinophils Absolute: 0.1 10*3/uL (ref 0.0–0.5)
MCHC: 33.8 g/dL (ref 32.0–36.0)
MONO#: 1.7 10*3/uL — ABNORMAL HIGH (ref 0.1–0.9)
NEUT#: 4.9 10*3/uL (ref 1.5–6.5)
Platelets: 158 10*3/uL (ref 140–400)
RBC: 3.6 10*6/uL — ABNORMAL LOW (ref 4.20–5.82)
RDW: 16.3 % — ABNORMAL HIGH (ref 11.0–14.6)
WBC: 8.9 10*3/uL (ref 4.0–10.3)
lymph#: 2.3 10*3/uL (ref 0.9–3.3)

## 2011-06-07 ENCOUNTER — Encounter: Payer: Self-pay | Admitting: *Deleted

## 2011-06-07 ENCOUNTER — Telehealth: Payer: Self-pay | Admitting: *Deleted

## 2011-06-07 NOTE — Telephone Encounter (Signed)
CBC stable-may decrease Prednisone to 15 mg daily and recheck on 3/4 as scheduled. Wife notified.

## 2011-06-09 DIAGNOSIS — E119 Type 2 diabetes mellitus without complications: Secondary | ICD-10-CM | POA: Diagnosis not present

## 2011-06-14 DIAGNOSIS — M545 Low back pain: Secondary | ICD-10-CM | POA: Diagnosis not present

## 2011-06-14 DIAGNOSIS — I10 Essential (primary) hypertension: Secondary | ICD-10-CM | POA: Diagnosis not present

## 2011-06-14 DIAGNOSIS — E119 Type 2 diabetes mellitus without complications: Secondary | ICD-10-CM | POA: Diagnosis not present

## 2011-06-14 DIAGNOSIS — R05 Cough: Secondary | ICD-10-CM | POA: Diagnosis not present

## 2011-06-20 ENCOUNTER — Other Ambulatory Visit (HOSPITAL_COMMUNITY): Payer: Self-pay | Admitting: Neurosurgery

## 2011-06-20 ENCOUNTER — Telehealth: Payer: Self-pay | Admitting: *Deleted

## 2011-06-20 ENCOUNTER — Other Ambulatory Visit: Payer: Medicare Other

## 2011-06-20 ENCOUNTER — Other Ambulatory Visit: Payer: Self-pay | Admitting: *Deleted

## 2011-06-20 DIAGNOSIS — D6949 Other primary thrombocytopenia: Secondary | ICD-10-CM

## 2011-06-20 DIAGNOSIS — M8448XA Pathological fracture, other site, initial encounter for fracture: Secondary | ICD-10-CM

## 2011-06-20 DIAGNOSIS — D696 Thrombocytopenia, unspecified: Secondary | ICD-10-CM

## 2011-06-20 DIAGNOSIS — M545 Low back pain, unspecified: Secondary | ICD-10-CM | POA: Diagnosis not present

## 2011-06-20 LAB — CBC WITH DIFFERENTIAL/PLATELET
BASO%: 0.3 % (ref 0.0–2.0)
EOS%: 1 % (ref 0.0–7.0)
Eosinophils Absolute: 0.1 10*3/uL (ref 0.0–0.5)
MCHC: 33.6 g/dL (ref 32.0–36.0)
MCV: 93.5 fL (ref 79.3–98.0)
MONO%: 15.5 % — ABNORMAL HIGH (ref 0.0–14.0)
NEUT#: 5.1 10*3/uL (ref 1.5–6.5)
RBC: 4.03 10*6/uL — ABNORMAL LOW (ref 4.20–5.82)
RDW: 16.8 % — ABNORMAL HIGH (ref 11.0–14.6)

## 2011-06-20 NOTE — Telephone Encounter (Signed)
Per Lonna Cobb, NP: Continue Prednisone 15 mg daily. Recheck counts in 1 week. Notified patient via home VM.

## 2011-06-21 ENCOUNTER — Telehealth: Payer: Self-pay | Admitting: Oncology

## 2011-06-21 ENCOUNTER — Other Ambulatory Visit (HOSPITAL_COMMUNITY): Payer: Self-pay | Admitting: Neurosurgery

## 2011-06-21 ENCOUNTER — Telehealth: Payer: Self-pay | Admitting: *Deleted

## 2011-06-21 ENCOUNTER — Other Ambulatory Visit: Payer: Self-pay | Admitting: *Deleted

## 2011-06-21 ENCOUNTER — Encounter: Payer: Self-pay | Admitting: *Deleted

## 2011-06-21 DIAGNOSIS — D696 Thrombocytopenia, unspecified: Secondary | ICD-10-CM | POA: Diagnosis not present

## 2011-06-21 DIAGNOSIS — M8448XA Pathological fracture, other site, initial encounter for fracture: Secondary | ICD-10-CM

## 2011-06-21 DIAGNOSIS — R05 Cough: Secondary | ICD-10-CM | POA: Diagnosis not present

## 2011-06-21 DIAGNOSIS — R609 Edema, unspecified: Secondary | ICD-10-CM | POA: Diagnosis not present

## 2011-06-21 DIAGNOSIS — S22009A Unspecified fracture of unspecified thoracic vertebra, initial encounter for closed fracture: Secondary | ICD-10-CM | POA: Diagnosis not present

## 2011-06-21 NOTE — Telephone Encounter (Signed)
called pt  s/w wife and scheduled lab appt for 03/11

## 2011-06-21 NOTE — Telephone Encounter (Signed)
Message from MD to decrease Prednisone to 10 mg daily. Wife notified. Seeing Dr. Jacky Kindle again today for cough and swelling in his feet. Was started on triameterene-hydrochlorothiazide 37.5/25 daily. Got dose of PCN yesterday for persistent cough. No fever or dyspnea. Had CXR. Has new fracture in vertebrae and was told he has osteoporosis. Asking if he could see Dr. Truett Perna prior to his 3/19 visit. Gave her appointment for 06/24/11 at 1:45/2:15 since there was a cancellation.

## 2011-06-22 ENCOUNTER — Other Ambulatory Visit: Payer: Self-pay | Admitting: Neurosurgery

## 2011-06-22 DIAGNOSIS — M8448XA Pathological fracture, other site, initial encounter for fracture: Secondary | ICD-10-CM

## 2011-06-22 DIAGNOSIS — M545 Low back pain: Secondary | ICD-10-CM

## 2011-06-24 ENCOUNTER — Ambulatory Visit (HOSPITAL_BASED_OUTPATIENT_CLINIC_OR_DEPARTMENT_OTHER): Payer: Medicare Other | Admitting: Nurse Practitioner

## 2011-06-24 ENCOUNTER — Telehealth: Payer: Self-pay | Admitting: Oncology

## 2011-06-24 ENCOUNTER — Other Ambulatory Visit (HOSPITAL_BASED_OUTPATIENT_CLINIC_OR_DEPARTMENT_OTHER): Payer: Medicare Other

## 2011-06-24 VITALS — BP 126/72 | HR 94 | Temp 97.1°F | Ht 69.0 in | Wt 208.3 lb

## 2011-06-24 DIAGNOSIS — R609 Edema, unspecified: Secondary | ICD-10-CM

## 2011-06-24 DIAGNOSIS — E139 Other specified diabetes mellitus without complications: Secondary | ICD-10-CM

## 2011-06-24 DIAGNOSIS — D696 Thrombocytopenia, unspecified: Secondary | ICD-10-CM | POA: Diagnosis not present

## 2011-06-24 DIAGNOSIS — Z794 Long term (current) use of insulin: Secondary | ICD-10-CM | POA: Diagnosis not present

## 2011-06-24 LAB — CBC WITH DIFFERENTIAL/PLATELET
BASO%: 0.2 % (ref 0.0–2.0)
Basophils Absolute: 0 10*3/uL (ref 0.0–0.1)
HCT: 36.6 % — ABNORMAL LOW (ref 38.4–49.9)
HGB: 12.1 g/dL — ABNORMAL LOW (ref 13.0–17.1)
LYMPH%: 20.6 % (ref 14.0–49.0)
MCH: 31 pg (ref 27.2–33.4)
MCHC: 33 g/dL (ref 32.0–36.0)
MONO#: 0.7 10*3/uL (ref 0.1–0.9)
NEUT%: 68.8 % (ref 39.0–75.0)
Platelets: 124 10*3/uL — ABNORMAL LOW (ref 140–400)
lymph#: 1.6 10*3/uL (ref 0.9–3.3)

## 2011-06-24 NOTE — Progress Notes (Signed)
OFFICE PROGRESS NOTE  Interval history:  Nathan Kemp returns prior to a scheduled visit due to concerns regarding leg swelling. He is trying to elevate his legs and is also wearing support stockings. The right leg tends to be more swollen than the left. He also reports sudden onset of low back pain 2 weeks ago. He was subsequently diagnosed with a compression fracture at T11. He states the fracture is a result of coughing. He is scheduled for an MRI within the next week. He reports a kyphoplasty is being considered. He is unable to lay flat due to the pain. He is taking pain medication but is unable to recall the name. He reports improved control of blood sugars. He is currently on Lantus and sliding scale insulin.   Objective: Blood pressure 126/72, pulse 94, temperature 97.1 F (36.2 C), temperature source Oral, height 5\' 9"  (1.753 m), weight 208 lb 4.8 oz (94.484 kg).  Oropharynx is without thrush. No palpable cervical, supraclavicular or axillary lymph nodes. Lungs are clear. No wheezes or rales. Regular cardiac rhythm. Abdomen is soft. He was unable to lay flat. Pitting edema below the knees bilaterally right greater than left.  Lab Results: Lab Results  Component Value Date   WBC 7.8 06/24/2011   HGB 12.1* 06/24/2011   HCT 36.6* 06/24/2011   MCV 93.9 06/24/2011   PLT 124* 06/24/2011    Chemistry:    Chemistry      Component Value Date/Time   NA 135 05/25/2011 1334   K 3.6 05/25/2011 1334   CL 98 05/25/2011 1334   CO2 26 05/25/2011 1334   BUN 25* 05/25/2011 1334   CREATININE 0.89 05/25/2011 1334      Component Value Date/Time   CALCIUM 9.4 05/25/2011 1334   ALKPHOS 137* 04/17/2011 0327   AST 17 04/17/2011 0327   ALT 27 04/17/2011 0327   BILITOT 0.4 04/17/2011 0327       Studies/Results: No results found.  Medications: I have reviewed the patient's current medications.  Assessment/Plan:  1. History of splenomegaly - the spleen has been palpable on intermittent office visits over the  past year. Splenomegaly was confirmed on an abdominal ultrasound 06/14/2010. 2. Elevated liver enzymes - persistent. 3. Pancytopenia.  4. History Systemic illness characterized by intermittent fever, weight loss, and malaise-improved. 5. History of coronary artery disease. 6. Bone marrow biopsy 11/27/2009 at the [facility] and on 12/19/2009 at [facility] confirmed a hypercellular marrow with mild reticulin fibrosis and no evidence of malignancy. 7. Liver biopsy 02/10/2010 revealed minimal portal and lobular inflammation with evidence of steatosis. 8. Excisional biopsy of a right inguinal lymph node on 02/19/2010 at [facility] with the pathology revealing no evidence of a malignancy. 9. Hypothyroidism - maintained on thyroid hormone replacement. 10. Seroma at the right inguinal lymph node biopsy site - improved. 11. Erectile dysfunction - the testosterone level was low 05/13/2010. 12. Hepatomegaly on the abdominal ultrasound 06/14/2010. 13. Skin hyperpigmentation over the extremities. 14. 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 15. 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 He continues a prednisone taper. Current prednisone dose is 10 mg daily. The platelet count is stable. 16. 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 He reports improved control of blood sugars.  17. Lower extremity edema likely secondary to steroids. 18. T11 compression fracture per patient report. He is following up with Dr. Newell Coral.  Disposition-Nathan Kemp will continue prednisone 10 mg daily. He will return for a followup CBC on 07/04/2011. We will taper the prednisone as possible. He will return for a  followup visit with Dr. Truett Perna on 08/01/2011. He will contact the office the interim with any problems.  Plan reviewed with Dr. Truett Perna.  Lonna Cobb ANP/GNP-BC

## 2011-06-24 NOTE — Telephone Encounter (Signed)
Gv pt appt for march-april2013 

## 2011-06-27 ENCOUNTER — Other Ambulatory Visit: Payer: Medicare Other | Admitting: Lab

## 2011-06-28 DIAGNOSIS — R05 Cough: Secondary | ICD-10-CM | POA: Diagnosis not present

## 2011-06-28 DIAGNOSIS — R059 Cough, unspecified: Secondary | ICD-10-CM | POA: Diagnosis not present

## 2011-06-29 ENCOUNTER — Ambulatory Visit (HOSPITAL_COMMUNITY)
Admission: RE | Admit: 2011-06-29 | Discharge: 2011-06-29 | Payer: Medicare Other | Source: Ambulatory Visit | Attending: Neurosurgery | Admitting: Neurosurgery

## 2011-06-29 ENCOUNTER — Ambulatory Visit (HOSPITAL_COMMUNITY)
Admission: RE | Admit: 2011-06-29 | Discharge: 2011-06-29 | Disposition: A | Discharge status: Home / Self Care | Payer: Medicare Other | Source: Ambulatory Visit | Attending: Neurosurgery | Admitting: Neurosurgery

## 2011-06-29 ENCOUNTER — Other Ambulatory Visit: Payer: Medicare Other

## 2011-06-29 DIAGNOSIS — D696 Thrombocytopenia, unspecified: Secondary | ICD-10-CM | POA: Insufficient documentation

## 2011-06-29 DIAGNOSIS — M5126 Other intervertebral disc displacement, lumbar region: Secondary | ICD-10-CM | POA: Diagnosis not present

## 2011-06-29 DIAGNOSIS — M5124 Other intervertebral disc displacement, thoracic region: Secondary | ICD-10-CM | POA: Insufficient documentation

## 2011-06-29 DIAGNOSIS — M8448XA Pathological fracture, other site, initial encounter for fracture: Secondary | ICD-10-CM | POA: Insufficient documentation

## 2011-06-29 DIAGNOSIS — IMO0002 Reserved for concepts with insufficient information to code with codable children: Secondary | ICD-10-CM | POA: Diagnosis not present

## 2011-06-29 DIAGNOSIS — M545 Low back pain, unspecified: Secondary | ICD-10-CM | POA: Diagnosis not present

## 2011-06-29 DIAGNOSIS — M519 Unspecified thoracic, thoracolumbar and lumbosacral intervertebral disc disorder: Secondary | ICD-10-CM | POA: Diagnosis not present

## 2011-06-29 DIAGNOSIS — M47814 Spondylosis without myelopathy or radiculopathy, thoracic region: Secondary | ICD-10-CM | POA: Diagnosis not present

## 2011-06-29 DIAGNOSIS — M5146 Schmorl's nodes, lumbar region: Secondary | ICD-10-CM | POA: Insufficient documentation

## 2011-06-29 DIAGNOSIS — J90 Pleural effusion, not elsewhere classified: Secondary | ICD-10-CM | POA: Insufficient documentation

## 2011-06-29 LAB — GLUCOSE, CAPILLARY: Glucose-Capillary: 115 mg/dL — ABNORMAL HIGH (ref 70–99)

## 2011-06-29 MED ORDER — GADOBENATE DIMEGLUMINE 529 MG/ML IV SOLN
20.0000 mL | Freq: Once | INTRAVENOUS | Status: AC
  Administered 2011-06-29: 20 mL via INTRAVENOUS

## 2011-06-29 MED ORDER — MIDAZOLAM HCL 2 MG/2ML IJ SOLN
1.0000 mg | INTRAMUSCULAR | Status: DC | PRN
  Administered 2011-06-29 (×2): 1 mg via INTRAVENOUS

## 2011-06-29 MED ORDER — MIDAZOLAM HCL 2 MG/2ML IJ SOLN
INTRAMUSCULAR | Status: AC
  Filled 2011-06-29: qty 10

## 2011-06-29 MED ORDER — MORPHINE SULFATE 2 MG/ML IJ SOLN: 2.0000 mg | INTRAMUSCULAR | Status: DC | PRN

## 2011-06-29 MED ORDER — FENTANYL CITRATE 0.05 MG/ML IJ SOLN
INTRAMUSCULAR | Status: AC
  Filled 2011-06-29: qty 4

## 2011-06-29 MED ORDER — MORPHINE SULFATE 4 MG/ML IJ SOLN
INTRAMUSCULAR | Status: AC
  Administered 2011-06-29: 1 mg via INTRAVENOUS
  Filled 2011-06-29: qty 1

## 2011-06-29 MED ORDER — FENTANYL CITRATE 0.05 MG/ML IJ SOLN
25.0000 ug | INTRAMUSCULAR | Status: DC | PRN
  Administered 2011-06-29 (×2): 50 ug via INTRAVENOUS
  Administered 2011-06-29 (×2): 25 ug via INTRAVENOUS

## 2011-06-29 NOTE — H&P (Signed)
Nathan Kemp is an 71 y.o. male.   Chief Complaint: Significant back pain associated with compression fracture. HPI: Patient has been on chronic prednisone, had a recent URI with lots of coughing. Patient experienced acute onset of pain in the back.  He presents today for MRI examination with moderate sedation for pain control.   Past Medical History  Diagnosis Date  . Anorexia   . Thrombocytopenia   . Fatigue   . Hypertension   . Hyperlipidemia   . Vitamin d deficiency   . ED (erectile dysfunction)   . Obesity   . DDD (degenerative disc disease), cervical   . Anemia   . Thyroid disease   . Trouble swallowing   . Cough   . Leg swelling     due to lymphedema  . Asthma   . Hypothyroidism   . Blood dyscrasia 06/2009    low platelet count  . Coronary artery disease   . Diabetes mellitus     borderline-controlled by diet  . Acute GI bleeding 04/16/2011    Past Surgical History  Procedure Date  . Cardiac catheterization 01/31/2005    EF 60-65%  . Coronary angioplasty with stent placement 2006    STENTING OF HIS CORONARY ARTERY  . Appendectomy 1967  . US echocardiography 02/28/2005    EF 55-60%  . Tonsillectomy and adenoidectomy   . Nasal sinus surgery 2005  . Edg dilitation 2007  . Lymph node removal 2011    right groin  . Bone marrow biopsy     2011-at [facility], 01/2010-at [place], 06/12[facility]  . Lymph node biopsy 03/15/2011    Procedure: LYMPH NODE BIOPSY;  Surgeon: [name], MD;  Location: [facility];  Service: General;  Laterality: Right;  Right Anterior Cervical Lymph Node Excisional Biopsy   . 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 Heart failure Father   . Hypertension Father   . Hearing loss Father 44    congestive heart failure  . Hypertension Mother   . Stroke Mother 10   Social History:  reports that he has never smoked. He has never used  smokeless tobacco. He reports that he does not drink alcohol or use illicit drugs.  Allergies:  Allergies  Allergen Reactions  . Omnicef Hives  . Lortab (Hydrocodone-Acetaminophen) Other (See Comments)    Makes patient very loopy  . Niaspan (Niacin) Hives  . Clindamycin Nausea Only and Other (See Comments)    Stomach cramps  . Codeine Rash    Had rash previously but not recently-taken in cough medicine and no problems    Medications Prior to Admission  Medication Sig Dispense Refill  . Cocoa Butter CREA Apply 2 g/oz topically as needed. For incision heal       . diphenhydrAMINE (BENADRYL) 25 MG tablet Take 50 mg by mouth at bedtime.       . insulin glargine (LANTUS) 100 UNIT/ML injection Inject 25 Units into the skin at bedtime.       . insulin regular (NOVOLIN R,HUMULIN R) 100 units/mL injection Inject 10 Units into the skin every 4 (four) hours. For glucose greater than 200.      Marland Kitchen Levothyroxine Sodium (SYNTHROID PO) Take 100 mcg by mouth every morning.       . loteprednol (LOTEMAX) 0.5 % ophthalmic suspension Place 1 drop into both eyes 2 (  two) times daily.       Marland Kitchen olmesartan-hydrochlorothiazide (BENICAR HCT) 40-25 MG per tablet Take 0.5 tablets by mouth every morning.       . pantoprazole (PROTONIX) 40 MG tablet Take 1 tablet (40 mg total) by mouth daily.  30 tablet  3  . Phenylephrine-DM-GG (TUSSEX PO) Take 5 mLs by mouth at bedtime.       Marland Kitchen zolpidem (AMBIEN) 10 MG tablet Take 5 mg by mouth at bedtime.       . nitroGLYCERIN (NITROSTAT) 0.4 MG SL tablet Place 0.4 mg under the tongue every 5 (five) minutes as needed. Chest pain      . predniSONE (DELTASONE) 20 MG tablet Take 10 mg by mouth daily.       Marland Kitchen triamterene-hydrochlorothiazide (MAXZIDE-25) 37.5-25 MG per tablet       . zinc gluconate 50 MG tablet Take 50 mg by mouth daily. Before meals       Medications Prior to Admission  Medication Dose Route Frequency Provider Last Rate Last Dose  . midazolam (VERSED) 2 MG/2ML  injection              Review of Systems  Constitutional: Positive for malaise/fatigue. Negative for fever and chills.  Respiratory: Negative for cough and shortness of breath.   Cardiovascular: Positive for leg swelling. Negative for chest pain, palpitations and orthopnea.  Gastrointestinal: Negative for nausea and vomiting.  Genitourinary: Negative.   Musculoskeletal: Positive for back pain.       Right LE 1-2+ pitting edema   Endo/Heme/Allergies: Bruises/bleeds easily.  Psychiatric/Behavioral: Negative for memory loss. The patient is not nervous/anxious and does not have insomnia.     Blood pressure 127/77, pulse 78, temperature 97.9 F (36.6 C), temperature source Oral, resp. rate 16, weight 208 lb (94.348 kg). Physical Exam  Constitutional: He is oriented to person, place, and time. He appears well-developed and well-nourished. No distress.  HENT:  Head: Normocephalic and atraumatic.  Eyes: EOM are normal. Pupils are equal, round, and reactive to light.  Cardiovascular: Normal rate, regular rhythm and normal heart sounds.  Exam reveals no gallop and no friction rub.   No murmur heard. Respiratory: Effort normal and breath sounds normal. No respiratory distress. He has no wheezes.  GI: Soft. Bowel sounds are normal.  Musculoskeletal: Normal range of motion. He exhibits edema.  Neurological: He is alert and oriented to person, place, and time.  Skin: Skin is warm and dry.  Psychiatric: He has a normal mood and affect. His behavior is normal. Judgment and thought content normal.     Assessment/Plan Patient with severe back pain - requiring IV medications for pain control to undergo MRI examination of the thoracic and lumbar spine.  Patient understands the risks of moderate sedation, has tolerated in the past without difficulty.  Written consent obtained.   Mamie Diiorio D 06/29/2011, 9:16 AM

## 2011-07-04 ENCOUNTER — Other Ambulatory Visit (HOSPITAL_BASED_OUTPATIENT_CLINIC_OR_DEPARTMENT_OTHER): Payer: Medicare Other | Admitting: Lab

## 2011-07-04 ENCOUNTER — Telehealth: Payer: Self-pay | Admitting: Oncology

## 2011-07-04 DIAGNOSIS — D696 Thrombocytopenia, unspecified: Secondary | ICD-10-CM | POA: Diagnosis not present

## 2011-07-04 DIAGNOSIS — M8448XA Pathological fracture, other site, initial encounter for fracture: Secondary | ICD-10-CM | POA: Diagnosis not present

## 2011-07-04 LAB — CBC WITH DIFFERENTIAL/PLATELET
Eosinophils Absolute: 0.1 10*3/uL (ref 0.0–0.5)
MONO#: 0.8 10*3/uL (ref 0.1–0.9)
MONO%: 11.8 % (ref 0.0–14.0)
NEUT#: 4.1 10*3/uL (ref 1.5–6.5)
RBC: 3.85 10*6/uL — ABNORMAL LOW (ref 4.20–5.82)
RDW: 16.8 % — ABNORMAL HIGH (ref 11.0–14.6)
WBC: 6.4 10*3/uL (ref 4.0–10.3)
lymph#: 1.4 10*3/uL (ref 0.9–3.3)

## 2011-07-04 NOTE — Telephone Encounter (Signed)
pts wife called and cancelled appt for 03/19

## 2011-07-05 ENCOUNTER — Telehealth: Payer: Self-pay | Admitting: *Deleted

## 2011-07-05 ENCOUNTER — Ambulatory Visit: Payer: Medicare Other | Admitting: Oncology

## 2011-07-05 DIAGNOSIS — D696 Thrombocytopenia, unspecified: Secondary | ICD-10-CM

## 2011-07-05 MED ORDER — PREDNISONE 5 MG PO TABS: 7.5000 mg | ORAL_TABLET | Freq: Every day | ORAL | Status: DC

## 2011-07-05 NOTE — Telephone Encounter (Signed)
Notified wife to decrease Prednisone to 7.5 mg daily. Will recheck on 07/18/11.

## 2011-07-08 ENCOUNTER — Ambulatory Visit
Admission: RE | Admit: 2011-07-08 | Discharge: 2011-07-08 | Disposition: A | Discharge status: Home / Self Care | Payer: Medicare Other | Source: Ambulatory Visit | Attending: Neurosurgery | Admitting: Neurosurgery

## 2011-07-08 DIAGNOSIS — M8448XA Pathological fracture, other site, initial encounter for fracture: Secondary | ICD-10-CM

## 2011-07-08 DIAGNOSIS — M545 Low back pain: Secondary | ICD-10-CM

## 2011-07-09 ENCOUNTER — Encounter (HOSPITAL_COMMUNITY): Payer: Self-pay | Admitting: *Deleted

## 2011-07-09 ENCOUNTER — Emergency Department (HOSPITAL_COMMUNITY)
Admission: EM | Admit: 2011-07-09 | Discharge: 2011-07-09 | Disposition: A | Discharge status: Home / Self Care | Payer: Medicare Other | Attending: Emergency Medicine | Admitting: Emergency Medicine

## 2011-07-09 DIAGNOSIS — I2510 Atherosclerotic heart disease of native coronary artery without angina pectoris: Secondary | ICD-10-CM | POA: Diagnosis not present

## 2011-07-09 DIAGNOSIS — J45909 Unspecified asthma, uncomplicated: Secondary | ICD-10-CM | POA: Diagnosis not present

## 2011-07-09 DIAGNOSIS — E669 Obesity, unspecified: Secondary | ICD-10-CM | POA: Diagnosis not present

## 2011-07-09 DIAGNOSIS — E119 Type 2 diabetes mellitus without complications: Secondary | ICD-10-CM | POA: Insufficient documentation

## 2011-07-09 DIAGNOSIS — M5030 Other cervical disc degeneration, unspecified cervical region: Secondary | ICD-10-CM | POA: Diagnosis not present

## 2011-07-09 DIAGNOSIS — X58XXXA Exposure to other specified factors, initial encounter: Secondary | ICD-10-CM | POA: Insufficient documentation

## 2011-07-09 DIAGNOSIS — I10 Essential (primary) hypertension: Secondary | ICD-10-CM | POA: Diagnosis not present

## 2011-07-09 DIAGNOSIS — E785 Hyperlipidemia, unspecified: Secondary | ICD-10-CM | POA: Insufficient documentation

## 2011-07-09 DIAGNOSIS — S22009A Unspecified fracture of unspecified thoracic vertebra, initial encounter for closed fracture: Secondary | ICD-10-CM | POA: Diagnosis not present

## 2011-07-09 DIAGNOSIS — M545 Low back pain, unspecified: Secondary | ICD-10-CM | POA: Insufficient documentation

## 2011-07-09 DIAGNOSIS — S22000A Wedge compression fracture of unspecified thoracic vertebra, initial encounter for closed fracture: Secondary | ICD-10-CM

## 2011-07-09 DIAGNOSIS — E039 Hypothyroidism, unspecified: Secondary | ICD-10-CM | POA: Insufficient documentation

## 2011-07-09 DIAGNOSIS — Z794 Long term (current) use of insulin: Secondary | ICD-10-CM | POA: Diagnosis not present

## 2011-07-09 MED ORDER — ONDANSETRON 8 MG PO TBDP
8.0000 mg | ORAL_TABLET | Freq: Once | ORAL | Status: AC
  Administered 2011-07-09: 8 mg via ORAL
  Filled 2011-07-09: qty 1

## 2011-07-09 MED ORDER — MORPHINE SULFATE 4 MG/ML IJ SOLN
8.0000 mg | Freq: Once | INTRAMUSCULAR | Status: AC
  Administered 2011-07-09: 8 mg via INTRAMUSCULAR
  Filled 2011-07-09: qty 2

## 2011-07-09 NOTE — ED Notes (Signed)
Urine sent to lab, for hold

## 2011-07-09 NOTE — ED Notes (Signed)
Pt c/o severe lower back pain; pt reports L11 fx per Dr. Bettina Gavia; pt sees Dr. Truett Perna thrombocytopenia with questionable lymphoma; pt reports he has been wearing a body brace "turtle shell" for 2 wks however he has been unable to wear it most of today due to severe pain and limitation of movement; pt reports hydrocodone w/ acetaminophen 1 tab q 2 hours yesterday and today; pt reports being on prednisone  for treatment of low platelet count and development of diabetes as a result; bilateral edema +4 in RLE and +2 in LLE .  Pt reports having cardiac stent.

## 2011-07-09 NOTE — Discharge Instructions (Signed)
Back, Compression Fracture  A compression fracture happens when a force is put upon the length of your spine. Slipping and falling on your bottom are examples of such a force. When this happens, sometimes the force is great enough to compress the building blocks (vertebral bodies) of your spine. Although this causes a lot of pain, this can usually be treated at home, unless your caregiver feels hospitalization is needed for pain control.  Your backbone (spinal column) is made up of 24 main vertebral bodies in addition to the sacrum and coccyx (see illustration). These are held together by tough fibrous tissues (ligaments) and by support of your muscles. Nerve roots pass through the openings between the vertebrae. A sudden wrenching move, injury, or a fall may cause a compression fracture of one of the vertebral bodies. This may result in back pain or spread of pain into the belly (abdomen), the buttocks, and down the leg into the foot. Pain may also be created by muscle spasm alone.  Large studies have been undertaken to determine the best possible course of action to help your back following injury and also to prevent future problems. The recommendations are as follows.  FOLLOWING A COMPRESSION FRACTURE:  Do the following only if advised by your caregiver.    If a back brace has been suggested or provided, wear it as directed.   DO NOT stop wearing the back brace unless instructed by your caregiver.   When allowed to return to regular activities, avoid a sedentary life style. Actively exercise. Sporadic weekend binges of tennis, racquetball, water skiing, may actually aggravate or create problems, especially if you are not in condition for that activity.   Avoid sports requiring sudden body movements until you are in condition for them. Swimming and walking are safer activities.   Maintain good posture.   Avoid obesity.   If not already done, you should have a DEXA scan. Based on the results, be treated for  osteoporosis.  FOLLOWING ACUTE (SUDDEN) INJURY:   Only take over-the-counter or prescription medicines for pain, discomfort, or fever as directed by your caregiver.   Use bed rest for only the most extreme acute episode. Prolonged bed rest may aggravate your condition. Ice used for acute conditions is effective. Use a large plastic bag filled with ice. Wrap it in a towel. This also provides excellent pain relief. This may be continuous. Or use it for 30 minutes every 2 hours during acute phase, then as needed. Heat for 30 minutes prior to activities is helpful.   As soon as the acute phase (the time when your back is too painful for you to do normal activities) is over, it is important to resume normal activities and work hardening programs. Back injuries can cause potentially marked changes in lifestyle. So it is important to attack these problems aggressively.   See your caregiver for continued problems. He or she can help or refer you for appropriate exercises, physical therapy and work hardening if needed.   If you are given narcotic medications for your condition, for the next 24 hours DO NOT:   Drive   Operate machinery or power tools.   Sign legal documents.   DO NOT drink alcohol, take sleeping pills or other medications that may interfere with treatment.  If your caregiver has given you a follow-up appointment, it is very important to keep that appointment. Not keeping the appointment could result in a chronic or permanent injury, pain, and disability. If there is any   problem keeping the appointment, you must call back to this facility for assistance.   SEEK IMMEDIATE MEDICAL CARE IF:   You develop numbness, tingling, weakness, or problems with the use of your arms or legs.   You develop severe back pain not relieved with medications.   You have changes in bowel or bladder control.   You have increasing pain in any areas of the body.  Document Released: 04/04/2005 Document Revised: 03/24/2011  Document Reviewed: 11/07/2007  [employer] Patient Information 2012 [employer].

## 2011-07-09 NOTE — ED Provider Notes (Signed)
History     CSN: [id number]  Arrival date & time 07/09/11  1842   First MD Initiated Contact with Patient 07/09/11 1938      Chief Complaint  Patient presents with  . Back Pain    (Consider location/radiation/quality/duration/timing/severity/associated sxs/prior treatment) HPI Comments: Pt has had back pain for about 5 weeks. The pain is located in its lower back. He had a recent MRI. This month, which showed an T11 compression fracture. It was felt that this was caused by a combination of possible osteoporosis and a bad coughing spell, he had with a recent URI. He's been seeing Dr. Bettina Gavia., a neurosurgeon. He's been taking pain medicine at home and has been wearing a turtle shell back brace for the last 2 weeks. He had a bone density scan done yesterday and feels like this has exacerbated his pain. He said that it got worse when he was laying down on the scanner. The pain is worse when he moves around or tries to lay down. There is no relation to his legs. No numbness or weakness in his legs. No abdominal pain. He states it is the same pain that he has been having for the last several weeks, but it just got worse after this procedure yesterday and is not controlled by the pain medicine. Dr. Ezzard Standing has been considering a kyphoplasty and the patient feels that this might have to be done. He is requesting some stronger pain medicine. Denies any loss of bowel or bladder control  Patient is a 71 y.o. male presenting with back pain. The history is provided by the patient.  Back Pain  This is a recurrent problem. Pertinent negatives include no chest pain, no fever, no numbness, no headaches, no abdominal pain and no weakness.    Past Medical History  Diagnosis Date  . Anorexia   . Thrombocytopenia   . Fatigue   . Hypertension   . Hyperlipidemia   . Vitamin d deficiency   . ED (erectile dysfunction)   . Obesity   . DDD (degenerative disc disease), cervical   . Anemia   . Thyroid disease    . Trouble swallowing   . Cough   . Leg swelling     due to lymphedema  . Asthma   . Hypothyroidism   . Blood dyscrasia 06/2009    low platelet count  . Coronary artery disease   . Diabetes mellitus     borderline-controlled by diet  . Acute GI bleeding 04/16/2011    Past Surgical History  Procedure Date  . Cardiac catheterization 01/31/2005    EF 60-65%  . Coronary angioplasty with stent placement 2006    STENTING OF HIS CORONARY ARTERY  . Appendectomy 1967  . US echocardiography 02/28/2005    EF 55-60%  . Tonsillectomy and adenoidectomy   . Nasal sinus surgery 2005  . Edg dilitation 2007  . Lymph node removal 2011    right groin  . Bone marrow biopsy     2011-at [facility], 01/2010-at [place], 06/12-[facility]  . Lymph node biopsy 03/15/2011    Procedure: LYMPH NODE BIOPSY;  Surgeon: [name], MD;  Location: [facility];  Service: General;  Laterality: Right;  Right Anterior Cervical Lymph Node Excisional Biopsy   . 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 Heart failure Father   . Hypertension Father   .  Hearing loss Father 75    congestive heart failure  . Diabetes Father   . Hypertension Mother   . Stroke Mother 25    History  Substance Use Topics  . Smoking status: Never Smoker   . Smokeless tobacco: Never Used  . Alcohol Use: No      Review of Systems  Constitutional: Negative for fever, chills, diaphoresis and fatigue.  HENT: Negative for congestion, rhinorrhea and sneezing.   Eyes: Negative.   Respiratory: Negative for cough, chest tightness and shortness of breath.   Cardiovascular: Positive for leg swelling. Negative for chest pain.  Gastrointestinal: Negative for nausea, vomiting, abdominal pain, diarrhea and blood in stool.  Genitourinary: Negative for frequency, hematuria, flank pain and difficulty urinating.  Musculoskeletal: Positive for back  pain. Negative for arthralgias.  Skin: Negative for rash.  Neurological: Negative for dizziness, speech difficulty, weakness, numbness and headaches.    Allergies  Omnicef; Lortab; Niaspan; Clindamycin; and Codeine  Home Medications   Current Outpatient Rx  Name Route Sig Dispense Refill  . DIPHENHYDRAMINE HCL 25 MG PO TABS Oral Take 50 mg by mouth at bedtime.     Marland Kitchen HYDROCODONE-ACETAMINOPHEN 5-500 MG PO CAPS Oral Take 1-2 capsules by mouth every 6 (six) hours as needed. For pain    . INSULIN GLARGINE 100 UNIT/ML [place] SOLN Subcutaneous Inject 18 Units into the skin at bedtime.     . INSULIN REGULAR HUMAN 100 UNIT/ML IJ SOLN Subcutaneous Inject 10 Units into the skin every 4 (four) hours. For glucose greater than 200.    Marland Kitchen SYNTHROID PO Oral Take 100 mcg by mouth every morning.     Marland Kitchen LOTEPREDNOL ETABONATE 0.5 % OP SUSP Both Eyes Place 1 drop into both eyes 2 (two) times daily.     Marland Kitchen NITROGLYCERIN 0.4 MG SL SUBL Sublingual Place 0.4 mg under the tongue every 5 (five) minutes as needed. Chest pain    . OLMESARTAN MEDOXOMIL-HCTZ 40-25 MG PO TABS Oral Take 0.5 tablets by mouth every morning.     Marland Kitchen PANTOPRAZOLE SODIUM 40 MG PO TBEC Oral Take 1 tablet (40 mg total) by mouth daily. 30 tablet 3  . PREDNISONE 5 MG PO TABS Oral Take 7.5 mg by mouth daily.    . TRIAMTERENE-HCTZ 37.5-25 MG PO TABS Oral Take 1 tablet by mouth daily.     Marland Kitchen ZOLPIDEM TARTRATE 10 MG PO TABS Oral Take 5 mg by mouth at bedtime.       BP 127/70  Pulse 92  Temp(Src) 98.7 F (37.1 C) (Oral)  Resp 17  Ht 5\' 9"  (1.753 m)  Wt 205 lb (92.987 kg)  BMI 30.27 kg/m2  SpO2 100%  Physical Exam  Constitutional: He is oriented to person, place, and time. He appears well-developed and well-nourished.  HENT:  Head: Normocephalic and atraumatic.  Eyes: Pupils are equal, round, and reactive to light.  Neck: Normal range of motion. Neck supple.  Cardiovascular: Normal rate, regular rhythm and normal heart sounds.   Pulmonary/Chest:  Effort normal and breath sounds normal. No respiratory distress. He has no wheezes. He has no rales. He exhibits no tenderness.  Abdominal: Soft. Bowel sounds are normal. There is no tenderness. There is no rebound and no guarding.  Musculoskeletal: Normal range of motion. He exhibits edema.       +TTP upper lumbar spine/lower T spine.  No step off/deformity noted.  Neg SLR bilaterally.  Normal sensation/motor function to legs  Lymphadenopathy:    He has no cervical adenopathy.  Neurological: He is alert and oriented to person, place, and time.  Skin: Skin is warm and dry. No rash noted.  Psychiatric: He has a normal mood and affect.    ED Course  Procedures (including critical care time)  Labs Reviewed - No data to display Dg Bone Density  07/08/2011  *RADIOLOGY REPORT*  Clinical Data: 71 year old male with a pathologic fracture of T11 and history of lymphoma.  Not currently taking supplemental calcium or vitamin D.  Currently on steroids.  DUAL X-RAY ABSORPTIOMETRY (DXA) FOR BONE MINERAL DENSITY  AP LUMBAR SPINE  Bone Mineral Density (BMD):                  1.091 g/cm2  Young Adult T Score:                                0.0  DUAL FEMUR  Bone Mineral Density (BMD):                  0.898 g/cm2  Young Adult T Score:                                -0.2  ASSESSMENT:  Patient's diagnostic category is NORMAL by WHO Criteria.  If the patient experienced a spontaneous compression fracture or had other fragility type fractures, this would meet the clinical criteria for a diagnosis of osteoporosis, regardless of numerical bone densitometry values.  RECOMMENDATIONS:  All patients should ensure an adequate intake of dietary calcium (1200 mg daily) and vitamin D (800 IU daily) unless contraindicated and avoid modifiable risk factors.  For male patients with T-score of -2.5 and below, pharmacologic treatment should be considered, as well as medical evaluation for secondary causes of low BMD.  FOLLOW-UP:  People  with diagnosed cases of osteoporosis or at high risk for fracture should have regular bone mineral density tests.  For patients eligible for Medicare, routine testing is allowed once every 2 years.  The testing frequency can be increased to one year for patients who have rapidly progressing disease, those who are receiving or discontinuing medical therapy to restore bone mass, or have additional risk factors.  World Science writer [facility]) Criteria: Normal:  T scores from +1.0 to -1.0 Low Bone Mass (Osteopenia):  T scores between -1.0 and -2.5 Osteoporosis:  T scores -2.5 and below  Comparison to Reference Population:  T score is the key measure used in the diagnosis of osteoporosis and relative risk determination for fracture.  It provides a value for bone mass relative to the mean bone mass of a young adult reference population expressed in terms of standard deviation (SD).  Z score is the age-matched score showing the patient's values compared to a population matched for age, sex, and race.  This is also expressed in terms of standard deviation.  The patient may have values that compare favorably to the age-matched values and still be at increased risk for fracture.  Original Report Authenticated By: Harrel Lemon, M.D.     1. Compression fracture of thoracic vertebra       MDM  Will give shot of morphine, if pain is better, continue home meds and f/u with neurosurgeon on Monday.  NO neuro deficits.  No change in location/radiation of pain.   2157: pt feeling much better.  Ready to go home, ambulating  Rolan Bucco, MD 07/09/11 2159

## 2011-07-18 ENCOUNTER — Other Ambulatory Visit (HOSPITAL_BASED_OUTPATIENT_CLINIC_OR_DEPARTMENT_OTHER): Payer: Medicare Other

## 2011-07-18 ENCOUNTER — Other Ambulatory Visit: Payer: Self-pay | Admitting: Neurosurgery

## 2011-07-18 DIAGNOSIS — M546 Pain in thoracic spine: Secondary | ICD-10-CM | POA: Diagnosis not present

## 2011-07-18 DIAGNOSIS — M47817 Spondylosis without myelopathy or radiculopathy, lumbosacral region: Secondary | ICD-10-CM | POA: Diagnosis not present

## 2011-07-18 DIAGNOSIS — D696 Thrombocytopenia, unspecified: Secondary | ICD-10-CM

## 2011-07-18 LAB — CBC WITH DIFFERENTIAL/PLATELET
Eosinophils Absolute: 0.1 10*3/uL (ref 0.0–0.5)
HCT: 36.5 % — ABNORMAL LOW (ref 38.4–49.9)
LYMPH%: 37.1 % (ref 14.0–49.0)
MONO#: 2.1 10*3/uL — ABNORMAL HIGH (ref 0.1–0.9)
NEUT#: 2.5 10*3/uL (ref 1.5–6.5)
Platelets: 139 10*3/uL — ABNORMAL LOW (ref 140–400)
RBC: 3.92 10*6/uL — ABNORMAL LOW (ref 4.20–5.82)
WBC: 7.5 10*3/uL (ref 4.0–10.3)
nRBC: 0 % (ref 0–0)

## 2011-07-19 ENCOUNTER — Encounter (HOSPITAL_COMMUNITY): Payer: Self-pay | Admitting: Respiratory Therapy

## 2011-07-20 ENCOUNTER — Encounter: Payer: Self-pay | Admitting: *Deleted

## 2011-07-20 ENCOUNTER — Telehealth: Payer: Self-pay | Admitting: *Deleted

## 2011-07-20 DIAGNOSIS — R918 Other nonspecific abnormal finding of lung field: Secondary | ICD-10-CM | POA: Diagnosis not present

## 2011-07-20 NOTE — Telephone Encounter (Signed)
Notified patient of CBC results and to decrease prednisone to 5 mg daily per Dr. Truett Perna. Will recheck on 08/01/11 as scheduled.

## 2011-07-22 ENCOUNTER — Encounter (HOSPITAL_COMMUNITY)
Admission: RE | Admit: 2011-07-22 | Discharge: 2011-07-22 | Disposition: A | Discharge status: Home / Self Care | Payer: Medicare Other | Source: Ambulatory Visit | Attending: Neurosurgery | Admitting: Neurosurgery

## 2011-07-22 ENCOUNTER — Encounter (HOSPITAL_COMMUNITY): Payer: Self-pay

## 2011-07-22 ENCOUNTER — Other Ambulatory Visit: Payer: Self-pay

## 2011-07-22 DIAGNOSIS — D47Z9 Other specified neoplasms of uncertain behavior of lymphoid, hematopoietic and related tissue: Secondary | ICD-10-CM | POA: Diagnosis not present

## 2011-07-22 DIAGNOSIS — S22009A Unspecified fracture of unspecified thoracic vertebra, initial encounter for closed fracture: Secondary | ICD-10-CM | POA: Diagnosis not present

## 2011-07-22 DIAGNOSIS — J45909 Unspecified asthma, uncomplicated: Secondary | ICD-10-CM | POA: Diagnosis not present

## 2011-07-22 DIAGNOSIS — I2510 Atherosclerotic heart disease of native coronary artery without angina pectoris: Secondary | ICD-10-CM | POA: Diagnosis not present

## 2011-07-22 HISTORY — DX: Reserved for inherently not codable concepts without codable children: IMO0001

## 2011-07-22 HISTORY — DX: Cardiac murmur, unspecified: R01.1

## 2011-07-22 HISTORY — DX: Gastro-esophageal reflux disease without esophagitis: K21.9

## 2011-07-22 HISTORY — DX: Pneumonia, unspecified organism: J18.9

## 2011-07-22 HISTORY — DX: Encounter for other specified aftercare: Z51.89

## 2011-07-22 HISTORY — DX: Malignant (primary) neoplasm, unspecified: C80.1

## 2011-07-22 LAB — BASIC METABOLIC PANEL
CO2: 27 mEq/L (ref 19–32)
Calcium: 9.6 mg/dL (ref 8.4–10.5)
Creatinine, Ser: 0.63 mg/dL (ref 0.50–1.35)
Glucose, Bld: 90 mg/dL (ref 70–99)

## 2011-07-22 LAB — CBC
MCH: 29.7 pg (ref 26.0–34.0)
MCV: 92.9 fL (ref 78.0–100.0)
Platelets: 143 10*3/uL — ABNORMAL LOW (ref 150–400)
RDW: 15.4 % (ref 11.5–15.5)
WBC: 6.3 10*3/uL (ref 4.0–10.5)

## 2011-07-22 LAB — ABO/RH: ABO/RH(D): O POS

## 2011-07-22 LAB — SURGICAL PCR SCREEN
MRSA, PCR: NEGATIVE
Staphylococcus aureus: NEGATIVE

## 2011-07-22 NOTE — Pre-Procedure Instructions (Addendum)
[address] OLUWAFERANMI WAIN  07/22/2011   Your procedure is scheduled on:  07/27/11  Report to [facility] at 630 AM.  Call this number if you have problems the morning of surgery: [phone]   Remember:   Do not eat food:After Midnight.  May have clear liquids: up to 4 Hours before arrival.230 am  Clear liquids include soda, tea, black coffee, apple or grape juice, broth.  Take these medicines the morning of surgery with A SIP OF WATER: protonix,synthroid, hydrocodone, benadryl as needed, eye drops, prednisone  STOP any aspirin, herbal meds, blood thinnersNO INSULIN AM OF SURGERY   Do not wear jewelry, make-up or nail polish.  Do not wear lotions, powders, or perfumes. You may wear deodorant.  Do not shave 48 hours prior to surgery.  Do not bring valuables to the hospital.  Contacts, dentures or bridgework may not be worn into surgery.  Leave suitcase in the car. After surgery it may be brought to your room.  For patients admitted to the hospital, checkout time is 11:00 AM the day of discharge.   Patients discharged the day of surgery will not be allowed to drive home.  Name and phone number of your driver: Alvino Chapel [phone]  Special Instructions: CHG Shower Use Special Wash: 1/2 bottle night before surgery and 1/2 bottle morning of surgery.   Please read over the following fact sheets that you were given: Pain Booklet, Coughing and Deep Breathing, Blood Transfusion Information, MRSA Information and Surgical Site Infection Prevention

## 2011-07-22 NOTE — Progress Notes (Signed)
cxr 12/12, note from dr Elease Hashimoto 9/12 include in note echo 06 results, cath 06 stent 06

## 2011-07-25 ENCOUNTER — Ambulatory Visit (HOSPITAL_BASED_OUTPATIENT_CLINIC_OR_DEPARTMENT_OTHER): Payer: Medicare Other | Admitting: Lab

## 2011-07-25 ENCOUNTER — Telehealth: Payer: Self-pay | Admitting: *Deleted

## 2011-07-25 DIAGNOSIS — D696 Thrombocytopenia, unspecified: Secondary | ICD-10-CM

## 2011-07-25 LAB — CBC WITH DIFFERENTIAL/PLATELET
Basophils Absolute: 0 10*3/uL (ref 0.0–0.1)
HCT: 34.3 % — ABNORMAL LOW (ref 38.4–49.9)
HGB: 11.4 g/dL — ABNORMAL LOW (ref 13.0–17.1)
MONO#: 2.2 10*3/uL — ABNORMAL HIGH (ref 0.1–0.9)
NEUT#: 2.4 10*3/uL (ref 1.5–6.5)
NEUT%: 38.2 % — ABNORMAL LOW (ref 39.0–75.0)
WBC: 6.4 10*3/uL (ref 4.0–10.3)
lymph#: 1.7 10*3/uL (ref 0.9–3.3)

## 2011-07-25 NOTE — Telephone Encounter (Signed)
Called patient. He will come in at 2 pm for lab and for RN to assess.

## 2011-07-25 NOTE — Telephone Encounter (Signed)
Wife called: patient having kyphoplasty on 4/10 by Dr. Jule Ser and she is concerned his platelets are getting low again. Thinks she sees petechiae forming again.

## 2011-07-26 MED ORDER — SODIUM CHLORIDE 0.9 % IV SOLN
1500.0000 mg | INTRAVENOUS | Status: AC
  Administered 2011-07-27: 1500 mg via INTRAVENOUS
  Filled 2011-07-26 (×2): qty 1500

## 2011-07-27 ENCOUNTER — Encounter (HOSPITAL_COMMUNITY): Payer: Self-pay | Admitting: *Deleted

## 2011-07-27 ENCOUNTER — Encounter (HOSPITAL_COMMUNITY)
Admission: RE | Disposition: A | Discharge status: Home / Self Care | Payer: Self-pay | Source: Ambulatory Visit | Attending: Neurosurgery

## 2011-07-27 ENCOUNTER — Encounter (HOSPITAL_COMMUNITY): Payer: Self-pay | Admitting: Certified Registered"

## 2011-07-27 ENCOUNTER — Ambulatory Visit (HOSPITAL_COMMUNITY): Payer: Medicare Other | Admitting: Certified Registered"

## 2011-07-27 ENCOUNTER — Ambulatory Visit (HOSPITAL_COMMUNITY): Payer: Medicare Other

## 2011-07-27 ENCOUNTER — Inpatient Hospital Stay (HOSPITAL_COMMUNITY)
Admission: RE | Admit: 2011-07-27 | Discharge: 2011-07-27 | DRG: 478 | Disposition: A | Discharge status: Home / Self Care | Payer: Medicare Other | Source: Ambulatory Visit | Attending: Neurosurgery | Admitting: Neurosurgery

## 2011-07-27 DIAGNOSIS — E039 Hypothyroidism, unspecified: Secondary | ICD-10-CM | POA: Diagnosis present

## 2011-07-27 DIAGNOSIS — Z794 Long term (current) use of insulin: Secondary | ICD-10-CM | POA: Diagnosis not present

## 2011-07-27 DIAGNOSIS — R12 Heartburn: Secondary | ICD-10-CM

## 2011-07-27 DIAGNOSIS — E119 Type 2 diabetes mellitus without complications: Secondary | ICD-10-CM | POA: Diagnosis present

## 2011-07-27 DIAGNOSIS — D6949 Other primary thrombocytopenia: Secondary | ICD-10-CM | POA: Diagnosis not present

## 2011-07-27 DIAGNOSIS — Z01810 Encounter for preprocedural cardiovascular examination: Secondary | ICD-10-CM

## 2011-07-27 DIAGNOSIS — M4000 Postural kyphosis, site unspecified: Secondary | ICD-10-CM | POA: Diagnosis not present

## 2011-07-27 DIAGNOSIS — Z01818 Encounter for other preprocedural examination: Secondary | ICD-10-CM

## 2011-07-27 DIAGNOSIS — K219 Gastro-esophageal reflux disease without esophagitis: Secondary | ICD-10-CM | POA: Diagnosis present

## 2011-07-27 DIAGNOSIS — S22009A Unspecified fracture of unspecified thoracic vertebra, initial encounter for closed fracture: Principal | ICD-10-CM | POA: Diagnosis present

## 2011-07-27 DIAGNOSIS — X58XXXA Exposure to other specified factors, initial encounter: Secondary | ICD-10-CM | POA: Diagnosis present

## 2011-07-27 DIAGNOSIS — D47Z9 Other specified neoplasms of uncertain behavior of lymphoid, hematopoietic and related tissue: Secondary | ICD-10-CM | POA: Diagnosis present

## 2011-07-27 DIAGNOSIS — J45909 Unspecified asthma, uncomplicated: Secondary | ICD-10-CM | POA: Diagnosis present

## 2011-07-27 DIAGNOSIS — I10 Essential (primary) hypertension: Secondary | ICD-10-CM | POA: Diagnosis present

## 2011-07-27 DIAGNOSIS — Z01812 Encounter for preprocedural laboratory examination: Secondary | ICD-10-CM | POA: Diagnosis not present

## 2011-07-27 DIAGNOSIS — I2510 Atherosclerotic heart disease of native coronary artery without angina pectoris: Secondary | ICD-10-CM | POA: Diagnosis present

## 2011-07-27 DIAGNOSIS — M8448XA Pathological fracture, other site, initial encounter for fracture: Secondary | ICD-10-CM | POA: Diagnosis not present

## 2011-07-27 DIAGNOSIS — IMO0002 Reserved for concepts with insufficient information to code with codable children: Secondary | ICD-10-CM | POA: Diagnosis not present

## 2011-07-27 DIAGNOSIS — Z4789 Encounter for other orthopedic aftercare: Secondary | ICD-10-CM | POA: Diagnosis not present

## 2011-07-27 LAB — GLUCOSE, CAPILLARY
Glucose-Capillary: 90 mg/dL (ref 70–99)
Glucose-Capillary: 94 mg/dL (ref 70–99)
Glucose-Capillary: 118 mg/dL — ABNORMAL HIGH (ref 70–99)

## 2011-07-27 SURGERY — KYPHOPLASTY
Anesthesia: General | Site: Back | Wound class: Clean

## 2011-07-27 MED ORDER — SUFENTANIL CITRATE 50 MCG/ML IV SOLN
INTRAVENOUS | Status: DC | PRN
  Administered 2011-07-27: 20 ug via INTRAVENOUS

## 2011-07-27 MED ORDER — OXYCODONE-ACETAMINOPHEN 5-325 MG PO TABS: 1.0000 | ORAL_TABLET | ORAL | Status: AC | PRN

## 2011-07-27 MED ORDER — SODIUM CHLORIDE 0.9 % IV SOLN
INTRAVENOUS | Status: DC | PRN
  Administered 2011-07-27: 09:00:00

## 2011-07-27 MED ORDER — OXYCODONE-ACETAMINOPHEN 5-325 MG PO TABS
1.0000 | ORAL_TABLET | ORAL | Status: DC | PRN
  Administered 2011-07-27: 2 via ORAL
  Filled 2011-07-27: qty 2

## 2011-07-27 MED ORDER — FENTANYL CITRATE 0.05 MG/ML IJ SOLN
INTRAMUSCULAR | Status: AC
  Filled 2011-07-27: qty 2

## 2011-07-27 MED ORDER — NITROGLYCERIN 0.4 MG SL SUBL: 0.4000 mg | SUBLINGUAL_TABLET | SUBLINGUAL | Status: DC | PRN

## 2011-07-27 MED ORDER — SODIUM CHLORIDE 0.9 % IJ SOLN: 3.0000 mL | INTRAMUSCULAR | Status: DC | PRN

## 2011-07-27 MED ORDER — LACTATED RINGERS IV SOLN
INTRAVENOUS | Status: DC | PRN
  Administered 2011-07-27: 08:00:00 via INTRAVENOUS

## 2011-07-27 MED ORDER — IRBESARTAN 150 MG PO TABS
150.0000 mg | ORAL_TABLET | Freq: Every day | ORAL | Status: DC
  Administered 2011-07-27: 150 mg via ORAL
  Filled 2011-07-27: qty 1

## 2011-07-27 MED ORDER — LOTEPREDNOL ETABONATE 0.5 % OP SUSP
1.0000 [drp] | Freq: Two times a day (BID) | OPHTHALMIC | Status: DC
  Filled 2011-07-27: qty 5

## 2011-07-27 MED ORDER — HYDROXYZINE HCL 25 MG PO TABS: 50.0000 mg | ORAL_TABLET | ORAL | Status: DC | PRN

## 2011-07-27 MED ORDER — KETOROLAC TROMETHAMINE 30 MG/ML IJ SOLN
INTRAMUSCULAR | Status: AC
  Filled 2011-07-27: qty 1

## 2011-07-27 MED ORDER — CYCLOBENZAPRINE HCL 10 MG PO TABS: 10.0000 mg | ORAL_TABLET | Freq: Three times a day (TID) | ORAL | Status: DC | PRN

## 2011-07-27 MED ORDER — PANTOPRAZOLE SODIUM 40 MG PO TBEC: 40.0000 mg | DELAYED_RELEASE_TABLET | Freq: Every day | ORAL | Status: DC

## 2011-07-27 MED ORDER — TRIAMTERENE-HCTZ 37.5-25 MG PO TABS
1.0000 | ORAL_TABLET | Freq: Every day | ORAL | Status: DC
  Administered 2011-07-27: 1 via ORAL
  Filled 2011-07-27: qty 1

## 2011-07-27 MED ORDER — GENTAMICIN IN SALINE 0.8-0.9 MG/ML-% IV SOLN: 80.0000 mg | Freq: Once | INTRAVENOUS | Status: DC

## 2011-07-27 MED ORDER — MEPERIDINE HCL 25 MG/ML IJ SOLN: 6.2500 mg | INTRAMUSCULAR | Status: DC | PRN

## 2011-07-27 MED ORDER — MENTHOL 3 MG MT LOZG: 1.0000 | LOZENGE | OROMUCOSAL | Status: DC | PRN

## 2011-07-27 MED ORDER — EPHEDRINE SULFATE 50 MG/ML IJ SOLN
INTRAMUSCULAR | Status: DC | PRN
  Administered 2011-07-27: 10 mg via INTRAVENOUS

## 2011-07-27 MED ORDER — LIDOCAINE HCL 4 % MT SOLN
OROMUCOSAL | Status: DC | PRN
  Administered 2011-07-27: 4 mL via TOPICAL

## 2011-07-27 MED ORDER — BUPIVACAINE HCL 0.5 % IJ SOLN
INTRAMUSCULAR | Status: DC | PRN
  Administered 2011-07-27: 4.5 mL

## 2011-07-27 MED ORDER — SODIUM CHLORIDE 0.9 % IV SOLN
INTRAVENOUS | Status: DC | PRN
  Administered 2011-07-27: 09:00:00 via INTRAVENOUS

## 2011-07-27 MED ORDER — PREDNISONE 5 MG PO TABS
5.0000 mg | ORAL_TABLET | Freq: Every day | ORAL | Status: DC
  Filled 2011-07-27: qty 1

## 2011-07-27 MED ORDER — PHENOL 1.4 % MT LIQD: 1.0000 | OROMUCOSAL | Status: DC | PRN

## 2011-07-27 MED ORDER — FENTANYL CITRATE 0.05 MG/ML IJ SOLN
25.0000 ug | INTRAMUSCULAR | Status: DC | PRN
  Administered 2011-07-27 (×2): 50 ug via INTRAVENOUS

## 2011-07-27 MED ORDER — KCL IN DEXTROSE-NACL 20-5-0.45 MEQ/L-%-% IV SOLN: INTRAVENOUS | Status: DC

## 2011-07-27 MED ORDER — MAGNESIUM HYDROXIDE 400 MG/5ML PO SUSP: 30.0000 mL | Freq: Every day | ORAL | Status: DC | PRN

## 2011-07-27 MED ORDER — LIDOCAINE-EPINEPHRINE 1 %-1:100000 IJ SOLN
INTRAMUSCULAR | Status: DC | PRN
  Administered 2011-07-27: 4.5 mL

## 2011-07-27 MED ORDER — HYDROXYZINE HCL 50 MG/ML IM SOLN: 50.0000 mg | INTRAMUSCULAR | Status: DC | PRN

## 2011-07-27 MED ORDER — GENTAMICIN SULFATE 40 MG/ML IJ SOLN
INTRAMUSCULAR | Status: DC
  Filled 2011-07-27 (×2): qty 2

## 2011-07-27 MED ORDER — INSULIN ASPART 100 UNIT/ML ~~LOC~~ SOLN: 10.0000 [IU] | SUBCUTANEOUS | Status: DC | PRN

## 2011-07-27 MED ORDER — HYDROCODONE-ACETAMINOPHEN 5-325 MG PO TABS: 1.0000 | ORAL_TABLET | ORAL | Status: DC | PRN

## 2011-07-27 MED ORDER — ACETAMINOPHEN 325 MG PO TABS: 650.0000 mg | ORAL_TABLET | ORAL | Status: DC | PRN

## 2011-07-27 MED ORDER — GENTAMICIN IN SALINE 1.6-0.9 MG/ML-% IV SOLN
80.0000 mg | INTRAVENOUS | Status: AC
  Administered 2011-07-27: 80 mg via INTRAVENOUS
  Filled 2011-07-27: qty 50

## 2011-07-27 MED ORDER — ROCURONIUM BROMIDE 100 MG/10ML IV SOLN
INTRAVENOUS | Status: DC | PRN
  Administered 2011-07-27: 50 mg via INTRAVENOUS

## 2011-07-27 MED ORDER — LIDOCAINE HCL (CARDIAC) 20 MG/ML IV SOLN
INTRAVENOUS | Status: DC | PRN
  Administered 2011-07-27: 100 mg via INTRAVENOUS

## 2011-07-27 MED ORDER — HYDROCORTISONE SOD SUCCINATE 1000 MG IJ SOLR
INTRAMUSCULAR | Status: DC | PRN
  Administered 2011-07-27: 100 mg via INTRAVENOUS

## 2011-07-27 MED ORDER — PROMETHAZINE HCL 25 MG/ML IJ SOLN: 6.2500 mg | INTRAMUSCULAR | Status: DC | PRN

## 2011-07-27 MED ORDER — ALUM & MAG HYDROXIDE-SIMETH 200-200-20 MG/5ML PO SUSP: 30.0000 mL | Freq: Four times a day (QID) | ORAL | Status: DC | PRN

## 2011-07-27 MED ORDER — KETOROLAC TROMETHAMINE 30 MG/ML IJ SOLN
15.0000 mg | Freq: Once | INTRAMUSCULAR | Status: AC | PRN
  Administered 2011-07-27: 30 mg via INTRAVENOUS

## 2011-07-27 MED ORDER — ACETAMINOPHEN 650 MG RE SUPP: 650.0000 mg | RECTAL | Status: DC | PRN

## 2011-07-27 MED ORDER — BISACODYL 10 MG RE SUPP: 10.0000 mg | Freq: Every day | RECTAL | Status: DC | PRN

## 2011-07-27 MED ORDER — KETOROLAC TROMETHAMINE 30 MG/ML IJ SOLN
30.0000 mg | Freq: Four times a day (QID) | INTRAMUSCULAR | Status: DC
  Administered 2011-07-27: 30 mg via INTRAVENOUS
  Filled 2011-07-27: qty 1

## 2011-07-27 MED ORDER — OLMESARTAN MEDOXOMIL-HCTZ 40-25 MG PO TABS: 0.5000 | ORAL_TABLET | ORAL | Status: DC

## 2011-07-27 MED ORDER — DROPERIDOL 2.5 MG/ML IJ SOLN
INTRAMUSCULAR | Status: DC | PRN
  Administered 2011-07-27: 0.625 mg via INTRAVENOUS

## 2011-07-27 MED ORDER — PROPOFOL 10 MG/ML IV EMUL
INTRAVENOUS | Status: DC | PRN
  Administered 2011-07-27: 150 mg via INTRAVENOUS

## 2011-07-27 MED ORDER — MIDAZOLAM HCL 2 MG/2ML IJ SOLN: 0.5000 mg | Freq: Once | INTRAMUSCULAR | Status: DC | PRN

## 2011-07-27 MED ORDER — MIDAZOLAM HCL 5 MG/5ML IJ SOLN
INTRAMUSCULAR | Status: DC | PRN
  Administered 2011-07-27: 2 mg via INTRAVENOUS

## 2011-07-27 MED ORDER — LEVOTHYROXINE SODIUM 100 MCG PO TABS
100.0000 ug | ORAL_TABLET | Freq: Every day | ORAL | Status: DC
  Filled 2011-07-27: qty 1

## 2011-07-27 MED ORDER — 0.9 % SODIUM CHLORIDE (POUR BTL) OPTIME
TOPICAL | Status: DC | PRN
  Administered 2011-07-27: 1000 mL

## 2011-07-27 MED ORDER — SODIUM CHLORIDE 0.9 % IJ SOLN
3.0000 mL | Freq: Two times a day (BID) | INTRAMUSCULAR | Status: DC
  Administered 2011-07-27: 3 mL via INTRAVENOUS

## 2011-07-27 MED ORDER — GLYCOPYRROLATE 0.2 MG/ML IJ SOLN
INTRAMUSCULAR | Status: DC | PRN
  Administered 2011-07-27: 0.4 mg via INTRAVENOUS

## 2011-07-27 MED ORDER — INSULIN REGULAR HUMAN 100 UNIT/ML IJ SOLN
10.0000 [IU] | INTRAMUSCULAR | Status: DC | PRN
  Filled 2011-07-27: qty 0.1

## 2011-07-27 MED ORDER — GENTAMICIN IN SALINE 0.8-0.9 MG/ML-% IV SOLN
80.0000 mg | INTRAVENOUS | Status: DC
  Filled 2011-07-27: qty 100

## 2011-07-27 MED ORDER — NEOSTIGMINE METHYLSULFATE 1 MG/ML IJ SOLN
INTRAMUSCULAR | Status: DC | PRN
  Administered 2011-07-27: 3 mg via INTRAVENOUS

## 2011-07-27 MED ORDER — KETOROLAC TROMETHAMINE 30 MG/ML IJ SOLN
30.0000 mg | Freq: Once | INTRAMUSCULAR | Status: DC
Start: 2011-07-27 — End: 2011-07-27

## 2011-07-27 MED ORDER — ZOLPIDEM TARTRATE 5 MG PO TABS: 5.0000 mg | ORAL_TABLET | Freq: Every evening | ORAL | Status: DC | PRN

## 2011-07-27 MED ORDER — MORPHINE SULFATE 4 MG/ML IJ SOLN: 4.0000 mg | INTRAMUSCULAR | Status: DC | PRN

## 2011-07-27 MED ORDER — INSULIN GLARGINE 100 UNIT/ML ~~LOC~~ SOLN: 18.0000 [IU] | Freq: Every day | SUBCUTANEOUS | Status: DC

## 2011-07-27 MED ORDER — POTASSIUM CHLORIDE IN NACL 20-0.9 MEQ/L-% IV SOLN
INTRAVENOUS | Status: DC
  Filled 2011-07-27 (×3): qty 1000

## 2011-07-27 SURGICAL SUPPLY — 43 items
ADH SKN CLS APL DERMABOND .7 (GAUZE/BANDAGES/DRESSINGS) ×1
BANDAGE ADHESIVE 1X3 (GAUZE/BANDAGES/DRESSINGS) ×4 IMPLANT
BLADE SURG 11 STRL SS (BLADE) ×2 IMPLANT
BLADE SURG ROTATE 9660 (MISCELLANEOUS) IMPLANT
CEMENT BONE KYPHX HV R (Orthopedic Implant) ×1 IMPLANT
CLOTH BEACON ORANGE TIMEOUT ST (SAFETY) ×2 IMPLANT
CONT SPEC 4OZ CLIKSEAL STRL BL (MISCELLANEOUS) ×3 IMPLANT
DECANTER SPIKE VIAL GLASS SM (MISCELLANEOUS) ×2 IMPLANT
DERMABOND ADVANCED (GAUZE/BANDAGES/DRESSINGS) ×1
DERMABOND ADVANCED .7 DNX12 (GAUZE/BANDAGES/DRESSINGS) IMPLANT
DEVICE BIOPSY BONE KYPHX (INSTRUMENTS) ×1 IMPLANT
DRAPE C-ARM 42X72 X-RAY (DRAPES) ×2 IMPLANT
DRAPE INCISE IOBAN 66X45 STRL (DRAPES) ×2 IMPLANT
DRAPE LAPAROTOMY 100X72X124 (DRAPES) ×2 IMPLANT
DRAPE PROXIMA HALF (DRAPES) ×1 IMPLANT
DURAPREP 26ML APPLICATOR (WOUND CARE) ×2 IMPLANT
GAUZE SPONGE 4X4 16PLY XRAY LF (GAUZE/BANDAGES/DRESSINGS) ×2 IMPLANT
GLOVE BIOGEL PI IND STRL 8 (GLOVE) ×1 IMPLANT
GLOVE BIOGEL PI INDICATOR 8 (GLOVE) ×1
GLOVE ECLIPSE 7.5 STRL STRAW (GLOVE) ×3 IMPLANT
GLOVE EXAM NITRILE LRG STRL (GLOVE) IMPLANT
GLOVE EXAM NITRILE MD LF STRL (GLOVE) IMPLANT
GLOVE EXAM NITRILE XL STR (GLOVE) IMPLANT
GLOVE EXAM NITRILE XS STR PU (GLOVE) IMPLANT
GLOVE INDICATOR 7.0 STRL GRN (GLOVE) ×1 IMPLANT
GLOVE SURG SS PI 7.0 STRL IVOR (GLOVE) ×1 IMPLANT
GOWN BRE IMP SLV AUR LG STRL (GOWN DISPOSABLE) ×1 IMPLANT
GOWN BRE IMP SLV AUR XL STRL (GOWN DISPOSABLE) ×1 IMPLANT
GOWN STRL REIN 2XL LVL4 (GOWN DISPOSABLE) IMPLANT
KIT BASIN OR (CUSTOM PROCEDURE TRAY) ×2 IMPLANT
KIT ROOM TURNOVER OR (KITS) ×2 IMPLANT
NDL HYPO 25X1 1.5 SAFETY (NEEDLE) ×1 IMPLANT
NEEDLE HYPO 25X1 1.5 SAFETY (NEEDLE) ×2 IMPLANT
NS IRRIG 1000ML POUR BTL (IV SOLUTION) ×2 IMPLANT
PACK SURGICAL SETUP 50X90 (CUSTOM PROCEDURE TRAY) ×2 IMPLANT
PAD ARMBOARD 7.5X6 YLW CONV (MISCELLANEOUS) ×6 IMPLANT
SPECIMEN JAR SMALL (MISCELLANEOUS) IMPLANT
SUT VIC AB 3-0 SH 8-18 (SUTURE) ×2 IMPLANT
SUT VIC AB 4-0 P-3 18X BRD (SUTURE) ×1 IMPLANT
SUT VIC AB 4-0 P3 18 (SUTURE) ×2
SYR CONTROL 10ML LL (SYRINGE) ×4 IMPLANT
TOWEL OR 17X24 6PK STRL BLUE (TOWEL DISPOSABLE) ×2 IMPLANT
TOWEL OR 17X26 10 PK STRL BLUE (TOWEL DISPOSABLE) ×2 IMPLANT

## 2011-07-27 NOTE — Anesthesia Preprocedure Evaluation (Addendum)
Anesthesia Evaluation  Patient identified by MRN, date of birth, ID band Patient awake    Reviewed: Allergy & Precautions, H&P , Patient's Chart, lab work & pertinent test results, reviewed documented beta blocker date and time   History of Anesthesia Complications Negative for: history of anesthetic complications  Airway Mallampati: III TM Distance: >3 FB Neck ROM: full    Dental No notable dental hx.    Pulmonary neg pulmonary ROS, asthma , pneumonia ,  breath sounds clear to auscultation  Pulmonary exam normal       Cardiovascular Exercise Tolerance: Good hypertension, + CAD negative cardio ROS  + Valvular Problems/Murmurs Rhythm:regular Rate:Normal     Neuro/Psych negative neurological ROS  negative psych ROS   GI/Hepatic negative GI ROS, Neg liver ROS, GERD-  ,  Endo/Other  negative endocrine ROSDiabetes mellitus-Hypothyroidism   Renal/GU negative Renal ROS     Musculoskeletal   Abdominal   Peds  Hematology negative hematology ROS (+)   Anesthesia Other Findings Anorexia     Thrombocytopenia        Fatigue     Hypertension        Hyperlipidemia     Vitamin d deficiency        Obesity     DDD (degenerative disc disease), cervical        Thyroid disease     Trouble swallowing        Cough     Leg swelling   due to lymphedema    Hypothyroidism     Diabetes mellitus   borderline-controlled by diet    Acute GI bleeding 04/16/2011   Coronary artery disease   stent 06    Heart murmur     Asthma   when  have congstion  of chest    Pneumonia     Anemia        Blood dyscrasia 06/2009 thrombocytopenia Blood transfusion   platelets 12/12    GERD (gastroesophageal reflux disease)     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   Dental Advisory Given  Plan Discussed with: CRNA and Surgeon  Anesthesia Plan Comments:         Anesthesia Quick Evaluation

## 2011-07-27 NOTE — Op Note (Signed)
07/27/2011  9:22 AM  PATIENT:  Nathan Kemp  71 y.o. male  PRE-OPERATIVE DIAGNOSIS:  T11 compression fracture  POST-OPERATIVE DIAGNOSIS:  T11 compression fracture  PROCEDURE:  Procedure(s): KYPHOPLASTY AND VERTEBRAL BODY BIOPSY   SURGEON:  Surgeon(s): Hewitt Shorts, MD Reinaldo Meeker, MD  ASSISTANTS: Reinaldo Meeker, M.D.  ANESTHESIA:   general  EBL: nil  BLOOD ADMINISTERED:none  COUNT: Correct per nursing staff  SPECIMEN:  Core Needle Biopsy  DICTATION: Patient brought to the operating room, placed under general endotracheal anesthesia. Patient was turned to prone position, the thoracolumbar region was prepped with DuraPrep and draped in a sterile fashion. Using biplanar C-arm fluoroscopy the left T11 pedicle was identified. The overlying skin and subcutaneous tissue were infiltrated with local anesthetic with epinephrine.  We then made a small 3 mm skin incision overlying the superior lateral quadrant of the left T11 pedicle. A cannulated trocar was then passed down to the superolateral margin of pedicle and gradually advanced into the pedicle under AP and lateral fluoroscopic guidance. The trajectory was a gentle inferior medial trajectory entering into the vertebral body. Once within the midportion of the vertebral body, the central trocar was removed and the balloon kyphoplasty device was passed into the vertebral body and gradually inflated, sequentially, allow for gradual expansion of the balloon and some restoration of vertebral body height. We then deflated the balloon and withdrew it, and then gradually filled the kyphoplasty cavity and vertebral body with methylmethacrylate, under fluoroscopic guidance. Good filling was noted on C-arm fluoroscopic guidance. A total of 5 cc of methylmethacrylate was used for the kyphoplasty, and final imaging looked good. We then proceeded with closure, the subcuticular layer was closed with interrupted inverted 3-0 undyed Vicryl  suture, and the skin is approximate Dermabond.  PLAN OF CARE: Admit to inpatient   PATIENT DISPOSITION:  PACU - hemodynamically stable.   Delay start of Pharmacological VTE agent (>24hrs) due to surgical blood loss or risk of bleeding:  yes

## 2011-07-27 NOTE — Discharge Summary (Signed)
Physician Discharge Summary  Patient ID: Nathan Kemp MRN: [id number] DOB/AGE: [birth year] 71 y.o.  Admit date: 07/27/2011 Discharge date: 07/27/2011  Admission Diagnoses: T11 compression fracture  Discharge Diagnoses: T11 compression fracture  Discharged Condition: good  Hospital Course: Patient was admitted underwent a T11 kyphoplasty and vertebral body biopsy. Postoperatively he has done well. He is much more comfortable postoperatively, than before surgery. His incision is clean and dry. He is up and ambulating. He is voiding well. He is asking to be discharged to home. We have given him instructions. He is to return for followup in 3 weeks in the office.  Discharge Exam: Blood pressure 124/72, pulse 66, temperature 97.8 F (36.6 C), temperature source Oral, resp. rate 20, height 5\' 9"  (1.753 m), weight 97.977 kg (216 lb), SpO2 95.00%.  Disposition: Home  Discharge Orders    Future Appointments: Provider: Department: Dept Phone: Center:   08/01/2011 9:00 AM Alvina Filbert [facility] [phone] None   08/01/2011 9:30 AM Ladene Artist, MD [facility] [phone] None     Medication List  As of 07/27/2011  6:23 PM   TAKE these medications         AMBIEN 10 MG tablet   Generic drug: zolpidem   Take 5 mg by mouth at bedtime.      diphenhydrAMINE 25 MG tablet   Commonly known as: BENADRYL   Take 50 mg by mouth at bedtime.      hydrocodone-acetaminophen 5-500 MG per capsule   Commonly known as: LORCET-HD   Take 1-2 capsules by mouth every 6 (six) hours as needed. For pain      insulin glargine 100 UNIT/ML injection   Commonly known as: LANTUS   Inject 18 Units into the skin at bedtime.      insulin regular 100 units/mL injection   Commonly known as: NOVOLIN R,HUMULIN R   Inject 10 Units into the skin as needed. For glucose greater than 150.      loteprednol 0.5 % ophthalmic suspension   Commonly known as: LOTEMAX   Place 1 drop into both eyes 2 (two)  times daily.      nitroGLYCERIN 0.4 MG SL tablet   Commonly known as: NITROSTAT   Place 0.4 mg under the tongue every 5 (five) minutes as needed. Chest pain      olmesartan-hydrochlorothiazide 40-25 MG per tablet   Commonly known as: BENICAR HCT   Take 0.5 tablets by mouth every morning.      oxyCODONE-acetaminophen 5-325 MG per tablet   Commonly known as: PERCOCET   Take 1-2 tablets by mouth every 4 (four) hours as needed for pain.      pantoprazole 40 MG tablet   Commonly known as: PROTONIX   Take 1 tablet (40 mg total) by mouth daily.      predniSONE 5 MG tablet   Commonly known as: DELTASONE   Take 5 mg by mouth daily.      SYNTHROID PO   Take 100 mcg by mouth every morning.      triamterene-hydrochlorothiazide 37.5-25 MG per tablet   Commonly known as: MAXZIDE-25   Take 1 tablet by mouth daily.             Signed: Hewitt Shorts, MD 07/27/2011, 6:23 PM

## 2011-07-27 NOTE — Transfer of Care (Signed)
Immediate Anesthesia Transfer of Care Note  Patient: Nathan Kemp  Procedure(s) Performed: Procedure(s) (LRB): KYPHOPLASTY (N/A)  Patient Location: PACU  Anesthesia Type: General  Level of Consciousness: awake, alert , oriented, patient cooperative and responds to stimulation  Airway & Oxygen Therapy: Patient Spontanous Breathing and Patient connected to nasal cannula oxygen  Post-op Assessment: Report given to PACU RN, Post -op Vital signs reviewed and stable and Patient moving all extremities  Post vital signs: Reviewed and stable  Complications: No apparent anesthesia complications

## 2011-07-27 NOTE — Discharge Instructions (Signed)

## 2011-07-27 NOTE — H&P (Signed)
Subjective: Patient is a 71 y.o. male who is admitted for treatment of T11 compression fracture. Symptoms began about a month and a half ago after having cough harshly, with the development of persistent back pain. There is no associated radicular pain. 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 He was treated with steroids which gradually tapered. He is under the care of Dr. Mancel Bale.  Workup included x-rays which showed loss of height of the T11 vertebra and a mild kyphosis consistent with a new compression fracture. He was fitted for a TLSO. MRI was obtained which confirmed the T11 compression fracture was also felt to show probable endplate fractures at T4, T5, and T12. Bone density study was obtained which showed normal bone density, despite multiple compression fractures.  Despite immobilization in a TLSO he continues to have significant disabling pain, he has had to sleep upright in a chair with the brace on. Because of persistent pain and x-ray evidence of further loss of height and angulation of the T11 compression fracture the patient is admitted now for a T11 kyphoplasty.   Patient Active Problem List  Diagnoses Date Noted  . Acute GI bleeding 04/16/2011  . Skin cancer 04/15/2011  . Thrombocytopenia, unspecified 03/28/2011  . Lymphadenopathy, neck 03/08/2011  . CAD (coronary artery disease) 01/13/2011  . HYPOTHYROIDISM 03/29/2010  . SPLENOMEGALY 03/29/2010  . HEPATOMEGALY, HX OF 03/29/2010  . FEVER, HX OF 03/29/2010  . HYPERLIPIDEMIA 09/07/2007  . HYPERTENSION 09/07/2007  . COUGH 09/07/2007   Past Medical History  Diagnosis Date  . Anorexia   . Thrombocytopenia   . Fatigue   . Hypertension   . Hyperlipidemia   . Vitamin d deficiency    . Obesity   . DDD (degenerative disc disease), cervical   . Thyroid disease   . Trouble swallowing   . Cough   . Leg swelling     due to lymphedema  . Hypothyroidism   . Diabetes mellitus     borderline-controlled by diet  . Acute GI bleeding 04/16/2011  . Coronary artery disease     stent 06  . Heart murmur   . Asthma     when  have congstion  of chest  . Pneumonia   . Anemia   . Blood dyscrasia 06/2009    thrombocytopenia  . Blood transfusion     platelets 12/12  . GERD (gastroesophageal reflux disease)   . 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 ED (erectile dysfunction)     Past Surgical History  Procedure Date  . Cardiac catheterization 01/31/2005    EF 60-65%  . Coronary angioplasty with stent placement 2006    STENTING OF HIS CORONARY ARTERY  . Appendectomy 1967  . US echocardiography 02/28/2005    EF 55-60%  . Tonsillectomy and adenoidectomy   . Nasal sinus surgery 2005  . Edg dilitation [id number]  . Lymph node removal 2011    right groin  . Bone marrow biopsy     2011-at [facility], 01/2010-at [facility], 06/12-[facility]  . Lymph node biopsy 03/15/2011    Procedure: LYMPH NODE BIOPSY;  Surgeon: [name], MD;  Location: [facility] ORS;  Service: General;  Laterality: Right;  Right Anterior Cervical Lymph Node Excisional Biopsy   . 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 diphenhydrAMINE (BENADRYL) 25 MG tablet Take 50 mg by mouth at bedtime.       . hydrocodone-acetaminophen (LORCET-HD) 5-500 MG per capsule Take 1-2 capsules by mouth every 6 (six) hours as needed. For pain      . insulin glargine (LANTUS) 100 UNIT/ML injection Inject 18 Units into the skin at bedtime.       . insulin regular (NOVOLIN R,HUMULIN R) 100 units/mL injection Inject 10 Units into the skin as needed. For glucose greater than 150.      Marland Kitchen Levothyroxine Sodium (SYNTHROID PO)  Take 100 mcg by mouth every morning.       . loteprednol (LOTEMAX) 0.5 % ophthalmic suspension Place 1 drop into both eyes 2 (two) times daily.       . nitroGLYCERIN (NITROSTAT) 0.4 MG SL tablet Place 0.4 mg under the tongue every 5 (five) minutes as needed. Chest pain      . olmesartan-hydrochlorothiazide (BENICAR HCT) 40-25 MG per tablet Take 0.5 tablets by mouth every morning.       . pantoprazole (PROTONIX) 40 MG tablet Take 1 tablet (40 mg total) by mouth daily.  30 tablet  3  . predniSONE (DELTASONE) 5 MG tablet Take 5 mg by mouth daily.       Marland Kitchen triamterene-hydrochlorothiazide (MAXZIDE-25) 37.5-25 MG per tablet Take 1 tablet by mouth daily.       Marland Kitchen zolpidem (AMBIEN) 10 MG tablet Take 5 mg by mouth at bedtime.        Allergies  Allergen Reactions  . Omnicef Hives  . Adhesive (Tape) Other (See Comments)    blisters  . Lortab (Hydrocodone-Acetaminophen) Other (See Comments)    Makes patient very loopy. Update   Able to take now  . Niaspan (Niacin) Hives  . Clindamycin Nausea Only and Other (See Comments)    Stomach cramps  . Codeine Rash    Had rash previously but not recently-taken in cough medicine and no problems but patient can tolerate    History  Substance Use Topics  . Smoking status: Never Smoker   . Smokeless tobacco: Never Used  . Alcohol Use: No    Family History  Problem Relation Age of Onset  . Heart failure Father   . Hypertension Father   . Hearing loss Father 12    congestive heart failure  . Diabetes Father   . Hypertension Mother   . Stroke Mother 97     Review of Systems A comprehensive review of systems was negative.  Objective: Vital signs in last 24 hours: Temp:  [98.4 F (36.9 C)] 98.4 F (36.9 C) (04/10 0732) Pulse Rate:  [75] 75  (04/10 0732) Resp:  [18] 18  (04/10 0732) BP: (130)/(71) 130/71 mmHg (04/10 0732) SpO2:  [95 %] 95 % (04/10 0732)  EXAM: Patient is a well-developed well-nourished white male in no acute distress. Lungs are  clear to auscultation , the patient has symmetrical respiratory excursion. Heart has a regular rate and rhythm normal S1 and S2 no murmur.   Abdomen is soft nontender nondistended bowel sounds are present. Extremity examination shows no clubbing or cyanosis, but there is marked edema in the distal lower extremities bilaterally. Musculoskeletal examination shows some tenderness over the lower thoracic region to palpation.  Motor examination shows 5 over 5 strength in the lower extremities including the  iliopsoas quadriceps dorsiflexor extensor hallicus  longus and plantar flexor bilaterally. Sensation is intact to pinprick in the distal lower extremities. Reflexes are symmetrical bilaterally. No pathologic reflexes are present. Patient has a normal gait and stance.   Data Review:CBC    Component Value Date/Time   WBC 6.4 07/25/2011 1410   WBC 6.3 07/22/2011 1015   RBC 3.71* 07/25/2011 1410   RBC 4.07* 07/22/2011 1015   HGB 11.4* 07/25/2011 1410   HGB 12.1* 07/22/2011 1015   HCT 34.3* 07/25/2011 1410   HCT 37.8* 07/22/2011 1015   PLT 127* 07/25/2011 1410   PLT 143* 07/22/2011 1015   MCV 92.4 07/25/2011 1410   MCV 92.9 07/22/2011 1015   MCH 30.7 07/25/2011 1410   MCH 29.7 07/22/2011 1015   MCHC 33.2 07/25/2011 1410   MCHC 32.0 07/22/2011 1015   RDW 16.1* 07/25/2011 1410   RDW 15.4 07/22/2011 1015   LYMPHSABS 1.7 07/25/2011 1410   LYMPHSABS 1.1 04/16/2011 1440   MONOABS 2.2* 07/25/2011 1410   MONOABS 0.8 04/16/2011 1440   EOSABS 0.1 07/25/2011 1410   EOSABS 0.1 04/16/2011 1440   BASOSABS 0.0 07/25/2011 1410   BASOSABS 0.1 04/16/2011 1440                          BMET    Component Value Date/Time   NA 138 07/22/2011 1015   K 3.8 07/22/2011 1015   CL 101 07/22/2011 1015   CO2 27 07/22/2011 1015   GLUCOSE 90 07/22/2011 1015   BUN 15 07/22/2011 1015   CREATININE 0.63 07/22/2011 1015   CALCIUM 9.6 07/22/2011 1015   GFRNONAA >90 07/22/2011 1015   GFRAA >90 07/22/2011 1015     Assessment/Plan: Patient with the T11 compression fracture,  sustained about 6 weeks or so ago, now admitted for T11 kyphoplasty because of persistent disabling pain, progression of loss of height and angulation of the T11 compression fracture. I discussed with the patient in nature the procedure, risks of surgery, typical length of hospital stay and recuperation, and the risks include those infection, bleeding, neurologic deficit, and anesthetic complications including myocardial infarction, stroke, pneumonia, and death. Understanding all this he wishes to proceed with surgery, and is admitted for such.   Hewitt Shorts, MD 07/27/2011 7:56 AM

## 2011-07-27 NOTE — Anesthesia Postprocedure Evaluation (Signed)
Anesthesia Post Note  Patient: Nathan Kemp  Procedure(s) Performed: Procedure(s) (LRB): KYPHOPLASTY (N/A)  Anesthesia type: GA  Patient location: PACU  Post pain: Pain level controlled  Post assessment: Post-op Vital signs reviewed  Last Vitals:  Filed Vitals:   07/27/11 1014  BP:   Pulse: 61  Temp:   Resp: 16    Post vital signs: Reviewed  Level of consciousness: sedated  Complications: No apparent anesthesia complications

## 2011-07-28 LAB — GLUCOSE, CAPILLARY: Glucose-Capillary: 100 mg/dL — ABNORMAL HIGH (ref 70–99)

## 2011-08-01 ENCOUNTER — Telehealth: Payer: Self-pay | Admitting: Oncology

## 2011-08-01 ENCOUNTER — Ambulatory Visit (HOSPITAL_BASED_OUTPATIENT_CLINIC_OR_DEPARTMENT_OTHER): Payer: Medicare Other | Admitting: Oncology

## 2011-08-01 ENCOUNTER — Other Ambulatory Visit (HOSPITAL_BASED_OUTPATIENT_CLINIC_OR_DEPARTMENT_OTHER): Payer: Medicare Other

## 2011-08-01 DIAGNOSIS — D61818 Other pancytopenia: Secondary | ICD-10-CM

## 2011-08-01 DIAGNOSIS — R161 Splenomegaly, not elsewhere classified: Secondary | ICD-10-CM | POA: Diagnosis not present

## 2011-08-01 DIAGNOSIS — D696 Thrombocytopenia, unspecified: Secondary | ICD-10-CM | POA: Diagnosis not present

## 2011-08-01 LAB — CBC WITH DIFFERENTIAL/PLATELET
BASO%: 0.7 % (ref 0.0–2.0)
EOS%: 2 % (ref 0.0–7.0)
MCH: 30.3 pg (ref 27.2–33.4)
MCHC: 32.9 g/dL (ref 32.0–36.0)
RDW: 16.1 % — ABNORMAL HIGH (ref 11.0–14.6)
lymph#: 1.4 10*3/uL (ref 0.9–3.3)

## 2011-08-01 NOTE — Telephone Encounter (Signed)
Gave pt appt for June 11th, date per pt request, lab and md

## 2011-08-01 NOTE — Progress Notes (Signed)
OFFICE PROGRESS NOTE   INTERVAL HISTORY:   He returns as scheduled. He is now maintained on prednisone at a dose of 5 mg daily.  The low back pain has partially improved since undergoing a T11 kyphoplasty on 07/27/2011. He complains of bilateral leg edema.  Objective:  Vital signs in last 24 hours:  Blood pressure 145/82, pulse 94, temperature 97.1 F (36.2 C), temperature source Oral, height 5\' 9"  (1.753 m), weight 213 lb 6.4 oz (96.798 kg).    HEENT: No thrush Lymphatics: No cervical, supraclavicular, axillary, or inguinal nodes Resp: End inspiratory rhonchi at the right posterior base, no respiratory distress Cardio: Regular rate and rhythm GI: Nontender, no hepatomegaly,? Palpable spleen tip below the left costal margin Vascular: Pitting edema at the leg below the knee bilaterally  Skin: Mild acne type rash over the trunk   Lab Results:  Lab Results  Component Value Date   WBC 5.4 08/01/2011   HGB 11.8* 08/01/2011   HCT 36.0* 08/01/2011   MCV 92.0 08/01/2011   PLT 97* 08/01/2011   ANC 2.1   Medications: I have reviewed the patient's current medications.  Assessment/Plan:  1.History of splenomegaly - the spleen has been palpable on intermittent office visits over the past year. Splenomegaly was confirmed on an abdominal ultrasound 06/14/2010.  Elevated liver enzymes - persistent.  2.Pancytopenia.  3.History Systemic illness characterized by intermittent fever, weight loss, and malaise-improved.  4.History of coronary artery disease.  5.Bone marrow biopsy 11/27/2009 at [facility] and on 12/19/2009 at [facility] confirmed a hypercellular marrow with mild reticulin fibrosis and no evidence of malignancy.  6.Liver biopsy 02/10/2010 revealed minimal portal and lobular inflammation with evidence of steatosis.  7.Excisional biopsy of a right inguinal lymph node on 02/19/2010 at [facility] with the pathology revealing no evidence of a malignancy. 8.Hypothyroidism -  maintained on thyroid hormone replacement. Seroma at the right inguinal lymph node biopsy site - improved. 9.Erectile dysfunction - the testosterone level was low 05/13/2010.  10.Hepatomegaly on the abdominal ultrasound 06/14/2010.  11.Skin hyperpigmentation over the extremities.  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 He continues a prednisone taper. Current prednisone dose is 5 mg daily. The platelet count is mildly decreased, but adequate..  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 He reports improved control of blood sugars. 15.Lower extremity edema likely secondary to steroids.  16.T11 compression fracture per patient report. Status post a kyphoplasty procedure Dr. Newell Coral on 07/27/2011.    Disposition:  The platelet count has decreased slightly while on the steroid taper. The plan is to continue a steroid taper as long as the platelet count remains at greater than 30-40,000. He will decrease the prednisone dose to 2.5 mg daily. He is scheduled for a CBC in 2 weeks in 4 weeks. We scheduled a 2 month office visit.  He will followup with Dr. Jacky Kindle for management of the leg edema. He is currently taking HCTZ in 2 different medications.   Lucile Shutters, MD  08/01/2011  2:34 PM

## 2011-08-02 ENCOUNTER — Encounter: Payer: Self-pay | Admitting: *Deleted

## 2011-08-09 ENCOUNTER — Emergency Department (HOSPITAL_COMMUNITY)
Admission: EM | Admit: 2011-08-09 | Discharge: 2011-08-10 | Disposition: A | Discharge status: Home / Self Care | Payer: Medicare Other | Attending: Emergency Medicine | Admitting: Emergency Medicine

## 2011-08-09 ENCOUNTER — Encounter (HOSPITAL_COMMUNITY): Payer: Self-pay

## 2011-08-09 ENCOUNTER — Emergency Department (HOSPITAL_COMMUNITY): Payer: Medicare Other

## 2011-08-09 DIAGNOSIS — E079 Disorder of thyroid, unspecified: Secondary | ICD-10-CM | POA: Insufficient documentation

## 2011-08-09 DIAGNOSIS — I2510 Atherosclerotic heart disease of native coronary artery without angina pectoris: Secondary | ICD-10-CM | POA: Insufficient documentation

## 2011-08-09 DIAGNOSIS — J45909 Unspecified asthma, uncomplicated: Secondary | ICD-10-CM | POA: Diagnosis not present

## 2011-08-09 DIAGNOSIS — R071 Chest pain on breathing: Secondary | ICD-10-CM | POA: Insufficient documentation

## 2011-08-09 DIAGNOSIS — Z8701 Personal history of pneumonia (recurrent): Secondary | ICD-10-CM | POA: Diagnosis not present

## 2011-08-09 DIAGNOSIS — E119 Type 2 diabetes mellitus without complications: Secondary | ICD-10-CM | POA: Diagnosis not present

## 2011-08-09 DIAGNOSIS — E785 Hyperlipidemia, unspecified: Secondary | ICD-10-CM | POA: Insufficient documentation

## 2011-08-09 DIAGNOSIS — R0781 Pleurodynia: Secondary | ICD-10-CM

## 2011-08-09 DIAGNOSIS — R079 Chest pain, unspecified: Secondary | ICD-10-CM | POA: Diagnosis not present

## 2011-08-09 DIAGNOSIS — Z794 Long term (current) use of insulin: Secondary | ICD-10-CM | POA: Diagnosis not present

## 2011-08-09 DIAGNOSIS — I517 Cardiomegaly: Secondary | ICD-10-CM | POA: Diagnosis not present

## 2011-08-09 DIAGNOSIS — I10 Essential (primary) hypertension: Secondary | ICD-10-CM | POA: Diagnosis not present

## 2011-08-09 DIAGNOSIS — K219 Gastro-esophageal reflux disease without esophagitis: Secondary | ICD-10-CM | POA: Diagnosis not present

## 2011-08-09 DIAGNOSIS — R0602 Shortness of breath: Secondary | ICD-10-CM | POA: Diagnosis not present

## 2011-08-09 MED ORDER — HYDROMORPHONE HCL PF 1 MG/ML IJ SOLN
1.0000 mg | Freq: Once | INTRAMUSCULAR | Status: AC
  Administered 2011-08-09: 1 mg via INTRAVENOUS
  Filled 2011-08-09: qty 1

## 2011-08-09 MED ORDER — ONDANSETRON HCL 4 MG/2ML IJ SOLN
4.0000 mg | Freq: Once | INTRAMUSCULAR | Status: AC
Start: 2011-08-09 — End: 2011-08-09
  Administered 2011-08-09: 4 mg via INTRAVENOUS
  Filled 2011-08-09: qty 2

## 2011-08-09 MED ORDER — METHOCARBAMOL 100 MG/ML IJ SOLN
1000.0000 mg | Freq: Once | INTRAVENOUS | Status: AC
  Administered 2011-08-09: 1000 mg via INTRAVENOUS
  Filled 2011-08-09: qty 10

## 2011-08-09 MED ORDER — METHOCARBAMOL 100 MG/ML IJ SOLN: 1000.0000 mg | Freq: Once | INTRAMUSCULAR | Status: DC

## 2011-08-09 NOTE — ED Notes (Signed)
Patient returned from X-ray 

## 2011-08-09 NOTE — ED Notes (Signed)
Pt in via ems with pain to the right side around ribcage states pain comes and goes pain intensifies with movement states pt is 2 weeks post op from kyphoplasy pt denies recent fall

## 2011-08-09 NOTE — ED Provider Notes (Addendum)
History     CSN: [id number]  Arrival date & time 08/09/11  4098   First MD Initiated Contact with Patient 08/09/11 2051      Chief Complaint  Patient presents with  . Rib Injury    right    (Consider location/radiation/quality/duration/timing/severity/associated sxs/prior treatment) The history is provided by the patient.   71 year old male complains of pain in his right rib cage. He is 2 weeks status post kyphoplasty for compression fracture of T11. Yesterday, he started having sharp, severe pain in the right lateral and anterior rib cage. Pain is worse with movement and worse with deep breathing. He is taken hydrocodone-acetaminophen 7.5-325 which only gives for a slight relief of his pain. He denies any trauma and denies coughing. He denies fever, chills, sweats. He is currently on prednisone with a slowly tapering dose. He has been on prednisone since December because of an exacerbation of immune thrombocytopenic purpura. Patient rates his pain at 10/10.  Past Medical History  Diagnosis Date  . Anorexia   . Thrombocytopenia   . Fatigue   . Hypertension   . Hyperlipidemia   . Vitamin d deficiency   . Obesity   . DDD (degenerative disc disease), cervical   . Thyroid disease   . Trouble swallowing   . Cough   . Leg swelling     due to lymphedema  . Hypothyroidism   . Diabetes mellitus     borderline-controlled by diet  . Acute GI bleeding 04/16/2011  . Coronary artery disease     stent 06  . Heart murmur   . Asthma     when  have congstion  of chest  . Pneumonia   . Anemia   . Blood dyscrasia 06/2009    thrombocytopenia  . Blood transfusion     platelets 12/12  . GERD (gastroesophageal reflux disease)   . 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 ED (erectile dysfunction)     Past Surgical History  Procedure Date  . Cardiac catheterization 01/31/2005    EF 60-65%  . Coronary angioplasty with stent placement 2006    STENTING OF HIS CORONARY ARTERY  .  Appendectomy 1967  . US echocardiography 02/28/2005    EF 55-60%  . Tonsillectomy and adenoidectomy   . Nasal sinus surgery 2005  . Edg dilitation [id number]  . Lymph node removal 2011    right groin  . Bone marrow biopsy     2011-at [facility], 01/2010-at [facility], 06/12-[facility]  . Lymph node biopsy 03/15/2011    Procedure: LYMPH NODE BIOPSY;  Surgeon: [name], MD;  Location: [facility];  Service: General;  Laterality: Right;  Right Anterior Cervical Lymph Node Excisional Biopsy   . 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 Heart failure Father   . Hypertension Father   . Hearing loss Father 67    congestive heart failure  . Diabetes Father   . Hypertension Mother   . Stroke Mother 56    History  Substance Use Topics  . Smoking status: Never Smoker   . Smokeless tobacco: Never Used  . Alcohol Use: No      Review of Systems  All other systems reviewed and are negative.    Allergies  Omnicef; Adhesive; Lortab; Niaspan; Clindamycin; and Codeine  Home Medications   Current Outpatient  Rx  Name Route Sig Dispense Refill  . DIPHENHYDRAMINE HCL 25 MG PO TABS Oral Take 50 mg by mouth at bedtime.     Marland Kitchen HYDROCODONE-ACETAMINOPHEN 5-500 MG PO CAPS Oral Take 1-2 capsules by mouth every 6 (six) hours as needed. For pain    . INSULIN GLARGINE 100 UNIT/ML [place] SOLN Subcutaneous Inject 10 Units into the skin at bedtime.     . INSULIN REGULAR HUMAN 100 UNIT/ML IJ SOLN Subcutaneous Inject 10 Units into the skin 3 (three) times daily as needed. For glucose greater than 150.    Marland Kitchen LEVOTHYROXINE SODIUM 100 MCG PO TABS Oral Take 100 mcg by mouth daily.    Marland Kitchen LOTEPREDNOL ETABONATE 0.5 % OP OINT Both Eyes Place 1 application into both eyes 2 (two) times daily.     Marland Kitchen NITROGLYCERIN 0.4 MG SL SUBL Sublingual Place 0.4 mg under the tongue every 5 (five) minutes as needed. Chest pain    . OLMESARTAN  MEDOXOMIL-HCTZ 40-25 MG PO TABS Oral Take 0.5 tablets by mouth every morning.     Marland Kitchen PANTOPRAZOLE SODIUM 40 MG PO TBEC Oral Take 1 tablet (40 mg total) by mouth daily. 30 tablet 3  . PREDNISONE 5 MG PO TABS Oral Take 2.5 mg by mouth daily.     . TRIAMTERENE-HCTZ 37.5-25 MG PO TABS Oral Take 1 tablet by mouth daily.     Marland Kitchen ZOLPIDEM TARTRATE 10 MG PO TABS Oral Take 5 mg by mouth at bedtime.       BP 135/61  Pulse 80  Temp(Src) 98 F (36.7 C) (Oral)  Resp 15  SpO2 100%  Physical Exam  Nursing note and vitals reviewed.  71 year old male who is in obvious pain. Vital signs are normal. Oxygen saturation is 100% which is normal. Note is made of initial vital signs of heart rate of 139 and oxygen saturation 98%, but these reverted to normal with no treatment. Head is normocephalic and atraumatic. PERRLA, EOMI. Neck is nontender and supple. Back has moderate tenderness over the lower thoracic spine outside of his anoplasty. Lungs are clear without rales, wheezes, rhonchi. There is moderate to severe tenderness to palpation over the right lateral and anterolateral lower rib cage. No crepitus is felt. Heart has regular rate and rhythm without murmur. Abdomen is soft, flat, nontender without masses or hepatosplenomegaly. Extremities have 1+ edema, no cyanosis. Full range of motion is present. Skin is warm and dry without rash. Neurologic: Mental status is normal, cranial nerves are intact, there are no focal motor or sensory deficits.  ED Course  Procedures (including critical care time)  Dg Ribs Unilateral W/chest Right  08/09/2011  *RADIOLOGY REPORT*  Clinical Data: Pain and right anterior rib pain since yesterday. No injury.  RIGHT RIBS AND CHEST - 3+ VIEW  Comparison: Chest 04/16/2011  Findings: Shallow inspiration with elevation of the right hemidiaphragm.  Mild cardiac enlargement with prominent pulmonary vascularity suggesting vascular congestion.  No edema.  Linear atelectasis or infiltration in the  right lung base.  No blunting of costophrenic angles.  No pneumothorax.  Increased density in the T11 vertebra consistent with prior kyphoplasty.  Right ribs appear intact.  No displaced fractures identified.  No expansile lesion or bone sclerosis.  IMPRESSION: Cardiac enlargement with pulmonary vascular congestion.  Shortness of breath with infiltration or atelectasis in the right lung base. Right ribs appear intact.  Original Report Authenticated By: Marlon Pel, M.D.   He was given repeated doses of hydromorphone intravenously with little relief of  pain. He was given a dose of intravenous Robaxin which also gave little relief of pain. At this point, I do not feel I can control his pain adequately as an outpatient and will look to admit him and consider anesthesia consult to see if a regional block can be administered. Basic labs have been ordered.  1. Pleuritic chest pain       MDM  Aortic right-sided chest pain which could be due to a rib fracture. Rib x-rays will be obtained. He'll be given hydromorphone for pain control.   Dione Booze, MD 08/10/11 [phone]  Case was discussed with Dr. Timothy Lasso who requested the patient be placed in TCU to be seen by Dr. Jacky Kindle in the morning. After this was arranged, patient stated that he was actually feeling better and wished to go home. He'll be sent home with prescriptions for hydromorphone, methocarbamol, and gabapentin. He's advised to return if he is not getting adequate relief of pain with these medications at home. He is to followup with Dr. Jacky Kindle in the next one to 2 days.  Dione Booze, MD 08/10/11 [phone]

## 2011-08-09 NOTE — ED Notes (Signed)
Patient transported to X-ray 

## 2011-08-10 LAB — BASIC METABOLIC PANEL
CO2: 26 mEq/L (ref 19–32)
Calcium: 9.8 mg/dL (ref 8.4–10.5)
GFR calc Af Amer: >90 mL/min (ref >90)
Sodium: 141 mEq/L (ref 135–145)

## 2011-08-10 LAB — CBC
MCH: 29.8 pg (ref 26.0–34.0)
Platelets: 157 10*3/uL (ref 150–400)
RBC: 4.2 MIL/uL — ABNORMAL LOW (ref 4.22–5.81)
RDW: 15.2 % (ref 11.5–15.5)
WBC: 7.6 10*3/uL (ref 4.0–10.5)

## 2011-08-10 LAB — DIFFERENTIAL
Basophils Absolute: 0 10*3/uL (ref 0.0–0.1)
Eosinophils Relative: 1 % (ref 0–5)
Monocytes Absolute: 3.1 10*3/uL — ABNORMAL HIGH (ref 0.1–1.0)
Monocytes Relative: 41 % — ABNORMAL HIGH (ref 3–12)
Neutrophils Relative %: 36 % — ABNORMAL LOW (ref 43–77)

## 2011-08-10 MED ORDER — METHOCARBAMOL 500 MG PO TABS: 500.0000 mg | ORAL_TABLET | Freq: Four times a day (QID) | ORAL | Status: AC | PRN

## 2011-08-10 MED ORDER — GABAPENTIN 300 MG PO CAPS: 300.0000 mg | ORAL_CAPSULE | Freq: Three times a day (TID) | ORAL | Status: DC

## 2011-08-10 MED ORDER — HYDROMORPHONE HCL 4 MG PO TABS
4.0000 mg | ORAL_TABLET | ORAL | Status: AC | PRN
Start: 2011-08-10 — End: 2011-08-20

## 2011-08-10 MED ORDER — HYDROMORPHONE HCL PF 1 MG/ML IJ SOLN
1.0000 mg | Freq: Once | INTRAMUSCULAR | Status: AC
  Administered 2011-08-10: 1 mg via INTRAVENOUS
  Filled 2011-08-10: qty 1

## 2011-08-10 NOTE — Discharge Instructions (Signed)
Return to the emergency department if your pain is not being adequately controlled with this medication at home. If pain is being adequately controlled, you can try taking only half of the hydromorphone tablet at a time.  Chest Pain (Nonspecific) It is often hard to give a specific diagnosis for the cause of chest pain. There is always a chance that your pain could be related to something serious, such as a heart attack or a blood clot in the lungs. You need to follow up with your caregiver for further evaluation. CAUSES   Heartburn.   Pneumonia or bronchitis.   Anxiety or stress.   Inflammation around your heart (pericarditis) or lung (pleuritis or pleurisy).   A blood clot in the lung.   A collapsed lung (pneumothorax). It can develop suddenly on its own (spontaneous pneumothorax) or from injury (trauma) to the chest.   Shingles infection (herpes zoster virus).  The chest wall is composed of bones, muscles, and cartilage. Any of these can be the source of the pain.  The bones can be bruised by injury.   The muscles or cartilage can be strained by coughing or overwork.   The cartilage can be affected by inflammation and become sore (costochondritis).  DIAGNOSIS  Lab tests or other studies, such as X-rays, electrocardiography, stress testing, or cardiac imaging, may be needed to find the cause of your pain.  TREATMENT   Treatment depends on what may be causing your chest pain. Treatment may include:   Acid blockers for heartburn.   Anti-inflammatory medicine.   Pain medicine for inflammatory conditions.   Antibiotics if an infection is present.   You may be advised to change lifestyle habits. This includes stopping smoking and avoiding alcohol, caffeine, and chocolate.   You may be advised to keep your head raised (elevated) when sleeping. This reduces the chance of acid going backward from your stomach into your esophagus.   Most of the time, nonspecific chest pain will  improve within 2 to 3 days with rest and mild pain medicine.  HOME CARE INSTRUCTIONS   If antibiotics were prescribed, take your antibiotics as directed. Finish them even if you start to feel better.   For the next few days, avoid physical activities that bring on chest pain. Continue physical activities as directed.   Do not smoke.   Avoid drinking alcohol.   Only take over-the-counter or prescription medicine for pain, discomfort, or fever as directed by your caregiver.   Follow your caregiver's suggestions for further testing if your chest pain does not go away.   Keep any follow-up appointments you made. If you do not go to an appointment, you could develop lasting (chronic) problems with pain. If there is any problem keeping an appointment, you must call to reschedule.  SEEK MEDICAL CARE IF:   You think you are having problems from the medicine you are taking. Read your medicine instructions carefully.   Your chest pain does not go away, even after treatment.   You develop a rash with blisters on your chest.  SEEK IMMEDIATE MEDICAL CARE IF:   You have increased chest pain or pain that spreads to your arm, neck, jaw, back, or abdomen.   You develop shortness of breath, an increasing cough, or you are coughing up blood.   You have severe back or abdominal pain, feel nauseous, or vomit.   You develop severe weakness, fainting, or chills.   You have a fever.  THIS IS AN EMERGENCY. Do not  wait to see if the pain will go away. Get medical help at once. Call your local emergency services (911 in [place] Do not drive yourself to the hospital. MAKE SURE YOU:   Understand these instructions.   Will watch your condition.   Will get help right away if you are not doing well or get worse.  Document Released: 01/12/2005 Document Revised: 03/24/2011 Document Reviewed: 11/08/2007 [facility] Patient Information 2012 [place], [place].  Hydromorphone tablets What is this  medicine? HYDROMORPHONE (hye droe MOR fone) is a pain reliever. It is used to treat moderate to severe pain. This medicine may be used for other purposes; ask your health care provider or pharmacist if you have questions. What should I tell my health care provider before I take this medicine? They need to know if you have any of these conditions: -brain tumor -drug abuse or addiction -head injury -heart disease -frequently drink alcohol containing drinks -kidney disease or problems going to the bathroom -liver disease -lung disease, asthma, or breathing problems -mental problems -an allergic or unusual reaction to lactose, hydromorphone, other opioid analgesics, other medicines, sulfites, foods, dyes, or preservatives -pregnant or trying to get pregnant -breast-feeding How should I use this medicine? Take this medicine by mouth with a glass of water. If the medicine upsets your stomach, take it with food or milk. Follow the directions on the prescription label. Do not take more medicine than you are told to take. Talk to your pediatrician regarding the use of this medicine in children. Special care may be needed. Overdosage: If you think you have taken too much of this medicine contact a poison control center or emergency room at once. NOTE: This medicine is only for you. Do not share this medicine with others. What if I miss a dose? If you miss a dose, take it as soon as you can. If it is almost time for your next dose, take only that dose. Do not take double or extra doses. What may interact with this medicine? -alcohol -antihistamines for allergy, cough and cold -medicines for anesthesia -medicines for depression, anxiety, or psychotic disturbances -medicines for sleep -muscle relaxants -naltrexone -narcotic medicines for pain -phenothiazines like chlorpromazine, mesoridazine, prochlorperazine, thioridazine This list may not describe all possible interactions. Give your health  care provider a list of all the medicines, herbs, non-prescription drugs, or dietary supplements you use. Also tell them if you smoke, drink alcohol, or use illegal drugs. Some items may interact with your medicine. What should I watch for while using this medicine? Tell your doctor or health care professional if your pain does not go away, if it gets worse, or if you have new or a different type of pain. You may develop tolerance to the medicine. Tolerance means that you will need a higher dose of the medicine for pain relief. Tolerance is normal and is expected if you take this medicine for a long time. Do not suddenly stop taking your medicine because you may develop a severe reaction. Your body becomes used to the medicine. This does NOT mean you are addicted. Addiction is a behavior related to getting and using a drug for a non-medical reason. If you have pain, you have a medical reason to take pain medicine. Your doctor will tell you how much medicine to take. If your doctor wants you to stop the medicine, the dose will be slowly lowered over time to avoid any side effects. You may get drowsy or dizzy. Do not drive, use machinery, or  do anything that needs mental alertness until you know how this medicine affects you. Do not stand or sit up quickly, especially if you are an older patient. This reduces the risk of dizzy or fainting spells. Alcohol may interfere with the effect of this medicine. Avoid alcoholic drinks. This medicine will cause constipation. Try to have a bowel movement at least every 2 to 3 days. If you do not have a bowel movement for 3 days, call your doctor or health care professional. Your mouth may get dry. Chewing sugarless gum or sucking hard candy, and drinking plenty of water may help. Contact your doctor if the problem does not go away or is severe. What side effects may I notice from receiving this medicine? Side effects that you should report to your doctor or health care  professional as soon as possible: -allergic reactions like skin rash, itching or hives, swelling of the face, lips, or tongue -breathing problems -changes in vision -confusion -feeling faint or lightheaded, falls -seizures -slow or fast heartbeat -trouble passing urine or change in the amount of urine -trouble with balance, talking, walking Side effects that usually do not require medical attention (report to your doctor or health care professional if they continue or are bothersome): -difficulty sleeping -drowsiness -dry mouth -flushing -headache -itching -loss of appetite -nausea, vomiting This list may not describe all possible side effects. Call your doctor for medical advice about side effects. You may report side effects to FDA at 1-800-FDA-1088. Where should I keep my medicine? Keep out of the reach of children. This medicine can be abused. Keep your medicine in a safe place to protect it from theft. Do not share this medicine with anyone. Selling or giving away this medicine is dangerous and against the law. Store at room temperature between 15 and 30 degrees C (59 and 86 degrees F). Keep container tightly closed. Protect from light. Flush any unused medicines down the toilet. Do not use the medicine after the expiration date. NOTE: This sheet is a summary. It may not cover all possible information. If you have questions about this medicine, talk to your doctor, pharmacist, or health care provider.  2012, Elsevier/Gold Standard. (02/29/2008 10:24:26 AM)  Methocarbamol tablets What is this medicine? METHOCARBAMOL (meth oh KAR ba mole) helps to relieve pain and stiffness in muscles caused by strains, sprains, or other injury to your muscles. This medicine may be used for other purposes; ask your health care provider or pharmacist if you have questions. What should I tell my health care provider before I take this medicine? They need to know if you have any of these  conditions: -kidney disease -seizures -an unusual or allergic reaction to methocarbamol, other medicines, foods, dyes, or preservatives -pregnant or trying to get pregnant -breast-feeding How should I use this medicine? Take this medicine by mouth with a full glass of water. Follow the directions on the prescription label. Take your medicine at regular intervals. Do not take your medicine more often than directed. Talk to your pediatrician regarding the use of this medicine in children. Special care may be needed. Overdosage: If you think you have taken too much of this medicine contact a poison control center or emergency room at once. NOTE: This medicine is only for you. Do not share this medicine with others. What if I miss a dose? If you miss a dose, take it as soon as you can. If it is almost time for your next dose, take only the next dose. Do  not take double or extra doses. What may interact with this medicine? -alcohol or medicines that contain alcohol -cholinesterase inhibitors like neostigmine, ambenonium, and pyridostigmine bromide -other medicines that cause drowsiness This list may not describe all possible interactions. Give your health care provider a list of all the medicines, herbs, non-prescription drugs, or dietary supplements you use. Also tell them if you smoke, drink alcohol, or use illegal drugs. Some items may interact with your medicine. What should I watch for while using this medicine? You may get drowsy or dizzy. Do not drive, use machinery, or do anything that needs mental alertness until you know how this medicine affects you. Do not stand or sit up quickly, especially if you are an older patient. This reduces the risk of dizzy or fainting spells. Alcohol may interfere with the effect of this medicine. Avoid alcoholic drinks. What side effects may I notice from receiving this medicine? Side effects that you should report to your doctor or health care professional as  soon as possible: -allergic reactions like skin rash, itching or hives, swelling of the face, lips, or tongue -blurred vision or changes in vision -confusion -fainting spells -fever -nausea or vomiting -seizures Side effects that usually do not require medical attention (report to your doctor or health care professional if they continue or are bothersome): -dizziness -drowsiness -headache -metallic taste This list may not describe all possible side effects. Call your doctor for medical advice about side effects. You may report side effects to FDA at 1-800-FDA-1088. Where should I keep my medicine? Keep out of the reach of children. Store at room temperature between 20 and 25 degrees C (68 and 77 degrees F). Keep container tightly closed. Throw away any unused medicine after the expiration date. NOTE: This sheet is a summary. It may not cover all possible information. If you have questions about this medicine, talk to your doctor, pharmacist, or health care provider.  2012, Elsevier/Gold Standard. (10/22/2007 4:02:03 PM)  Gabapentin capsules or tablets What is this medicine? GABAPENTIN (GA ba pen tin) is used to control partial seizures in adults with epilepsy. It is also used to treat certain types of nerve pain. This medicine may be used for other purposes; ask your health care provider or pharmacist if you have questions. What should I tell my health care provider before I take this medicine? They need to know if you have any of these conditions: -kidney disease -suicidal thoughts, plans, or attempt; a previous suicide attempt by you or a family member -an unusual or allergic reaction to gabapentin, other medicines, foods, dyes, or preservatives -pregnant or trying to get pregnant -breast-feeding How should I use this medicine? Take this medicine by mouth. Swallow it with a drink of water. Follow the directions on the prescription label. If this medicine upsets your stomach, take it  with food or milk. Take your medicine at regular intervals. Do not take it more often than directed. If you are directed to break the 600 or 800 mg tablets in half as part of your dose, the extra half tablet should be used for the next dose. If you have not used the extra half tablet within 3 days, it should be thrown away. A special MedGuide will be given to you by the pharmacist with each prescription and refill. Be sure to read this information carefully each time. Talk to your pediatrician regarding the use of this medicine in children. Special care may be needed. Overdosage: If you think you have taken too  much of this medicine contact a poison control center or emergency room at once. NOTE: This medicine is only for you. Do not share this medicine with others. What if I miss a dose? If you miss a dose, take it as soon as you can. If it is almost time for your next dose, take only that dose. Do not take double or extra doses. What may interact with this medicine? -antacids -hydrocodone -morphine -naproxen -sevelamer This list may not describe all possible interactions. Give your health care provider a list of all the medicines, herbs, non-prescription drugs, or dietary supplements you use. Also tell them if you smoke, drink alcohol, or use illegal drugs. Some items may interact with your medicine. What should I watch for while using this medicine? Visit your doctor or health care professional for regular checks on your progress. You may want to keep a record at home of how you feel your condition is responding to treatment. You may want to share this information with your doctor or health care professional at each visit. You should contact your doctor or health care professional if your seizures get worse or if you have any new types of seizures. Do not stop taking this medicine or any of your seizure medicines unless instructed by your doctor or health care professional. Stopping your medicine  suddenly can increase your seizures or their severity. Wear a medical identification bracelet or chain if you are taking this medicine for seizures, and carry a card that lists all your medications. You may get drowsy, dizzy, or have blurred vision. Do not drive, use machinery, or do anything that needs mental alertness until you know how this medicine affects you. To reduce dizzy or fainting spells, do not sit or stand up quickly, especially if you are an older patient. Alcohol can increase drowsiness and dizziness. Avoid alcoholic drinks. Your mouth may get dry. Chewing sugarless gum or sucking hard candy, and drinking plenty of water will help. The use of this medicine may increase the chance of suicidal thoughts or actions. Pay special attention to how you are responding while on this medicine. Any worsening of mood, or thoughts of suicide or dying should be reported to your health care professional right away. Women who become pregnant while using this medicine may enroll in the [place] [employer] Antiepileptic Drug Pregnancy Registry by calling [phone]. This registry collects information about the safety of antiepileptic drug use during pregnancy. What side effects may I notice from receiving this medicine? Side effects that you should report to your doctor or health care professional as soon as possible: -allergic reactions like skin rash, itching or hives, swelling of the face, lips, or tongue -worsening of mood, thoughts or actions of suicide or dying Side effects that usually do not require medical attention (report to your doctor or health care professional if they continue or are bothersome): -constipation -difficulty walking or controlling muscle movements -nausea -slurred speech -tremors -weight gain This list may not describe all possible side effects. Call your doctor for medical advice about side effects. You may report side effects to FDA at 1-800-FDA-1088. Where should I keep  my medicine? Keep out of reach of children. Store at room temperature between 15 and 30 degrees C (59 and 86 degrees F). Throw away any unused medicine after the expiration date. NOTE: This sheet is a summary. It may not cover all possible information. If you have questions about this medicine, talk to your doctor, pharmacist, or health care provider.  2012, Elsevier/Gold Standard. (12/01/2009 6:06:26 PM)

## 2011-08-10 NOTE — ED Notes (Signed)
Pt states he does not want to stay overnight in hospital. States he feels better and will try to ambulate. Dr. Preston Fleeting notified.

## 2011-08-10 NOTE — ED Notes (Signed)
MD at bedside. Dr. Preston Fleeting at bedside speaking with pt and pt's wife.

## 2011-08-15 ENCOUNTER — Ambulatory Visit (HOSPITAL_BASED_OUTPATIENT_CLINIC_OR_DEPARTMENT_OTHER): Payer: Medicare Other | Admitting: Lab

## 2011-08-15 ENCOUNTER — Telehealth: Payer: Self-pay

## 2011-08-15 ENCOUNTER — Other Ambulatory Visit: Payer: Self-pay | Admitting: *Deleted

## 2011-08-15 ENCOUNTER — Telehealth: Payer: Self-pay | Admitting: Oncology

## 2011-08-15 ENCOUNTER — Telehealth: Payer: Self-pay | Admitting: *Deleted

## 2011-08-15 DIAGNOSIS — D6949 Other primary thrombocytopenia: Secondary | ICD-10-CM | POA: Diagnosis not present

## 2011-08-15 DIAGNOSIS — E039 Hypothyroidism, unspecified: Secondary | ICD-10-CM

## 2011-08-15 LAB — CBC WITH DIFFERENTIAL/PLATELET
BASO%: 0.2 % (ref 0.0–2.0)
Eosinophils Absolute: 0.1 10*3/uL (ref 0.0–0.5)
LYMPH%: 20.2 % (ref 14.0–49.0)
MONO#: 3.3 10*3/uL — ABNORMAL HIGH (ref 0.1–0.9)
NEUT#: 2.1 10*3/uL (ref 1.5–6.5)
Platelets: 94 10*3/uL — ABNORMAL LOW (ref 140–400)
RBC: 3.72 10*6/uL — ABNORMAL LOW (ref 4.20–5.82)
RDW: 15.8 % — ABNORMAL HIGH (ref 11.0–14.6)
WBC: 6.9 10*3/uL (ref 4.0–10.3)
lymph#: 1.4 10*3/uL (ref 0.9–3.3)
nRBC: 0 % (ref 0–0)

## 2011-08-15 NOTE — Telephone Encounter (Signed)
Pt notified per Dr Truett Perna - labs stable - continue on Prednisone 2.5mg  daily.  Pt verbalizes understanding.  dph

## 2011-08-15 NOTE — Telephone Encounter (Signed)
Wife has called X 2 today asking if he needs labs today and on 5/13? Not on printed schedule. Thinks June is too late to wait to see MD. Wants clarification and to be called. Asking for TSH and testosterone level checked today with CBC .

## 2011-08-15 NOTE — Telephone Encounter (Signed)
called pts wife and scheduled lab for today and 05/13

## 2011-08-19 DIAGNOSIS — L570 Actinic keratosis: Secondary | ICD-10-CM | POA: Diagnosis not present

## 2011-08-19 DIAGNOSIS — M8448XA Pathological fracture, other site, initial encounter for fracture: Secondary | ICD-10-CM | POA: Diagnosis not present

## 2011-08-19 DIAGNOSIS — L578 Other skin changes due to chronic exposure to nonionizing radiation: Secondary | ICD-10-CM | POA: Diagnosis not present

## 2011-08-19 DIAGNOSIS — L821 Other seborrheic keratosis: Secondary | ICD-10-CM | POA: Diagnosis not present

## 2011-08-29 ENCOUNTER — Other Ambulatory Visit (HOSPITAL_BASED_OUTPATIENT_CLINIC_OR_DEPARTMENT_OTHER): Payer: Medicare Other

## 2011-08-29 DIAGNOSIS — D6949 Other primary thrombocytopenia: Secondary | ICD-10-CM

## 2011-08-29 DIAGNOSIS — E291 Testicular hypofunction: Secondary | ICD-10-CM | POA: Diagnosis not present

## 2011-08-29 DIAGNOSIS — E039 Hypothyroidism, unspecified: Secondary | ICD-10-CM

## 2011-08-29 LAB — CBC WITH DIFFERENTIAL/PLATELET
Basophils Absolute: 0 10*3/uL (ref 0.0–0.1)
Eosinophils Absolute: 0.1 10*3/uL (ref 0.0–0.5)
HCT: 36.5 % — ABNORMAL LOW (ref 38.4–49.9)
HGB: 12.1 g/dL — ABNORMAL LOW (ref 13.0–17.1)
MONO#: 2.4 10*3/uL — ABNORMAL HIGH (ref 0.1–0.9)
NEUT#: 3.5 10*3/uL (ref 1.5–6.5)
NEUT%: 48.3 % (ref 39.0–75.0)
RDW: 16 % — ABNORMAL HIGH (ref 11.0–14.6)
WBC: 7.1 10*3/uL (ref 4.0–10.3)
lymph#: 1.3 10*3/uL (ref 0.9–3.3)

## 2011-08-29 LAB — TSH: TSH: 3.85 u[IU]/mL (ref 0.350–4.500)

## 2011-08-29 LAB — TESTOSTERONE: Testosterone: 162.17 ng/dL — ABNORMAL LOW (ref 300–890)

## 2011-08-30 ENCOUNTER — Telehealth: Payer: Self-pay | Admitting: *Deleted

## 2011-08-30 DIAGNOSIS — D696 Thrombocytopenia, unspecified: Secondary | ICD-10-CM

## 2011-08-30 NOTE — Telephone Encounter (Signed)
Made patient aware of lab results from 5/13- Per Dr. Truett Perna: change prednisone to 2.5 mg every other day X 5 doses, then recheck CBC in 2 weeks. Routed labs to Dr. Jacky Kindle. Patient requests to come to office to pick up copy of all his labs from 08/15/11 & 08/29/11 (forwarded request to medical records).

## 2011-08-30 NOTE — Telephone Encounter (Signed)
Message copied by Wandalee Ferdinand on Tue Aug 30, 2011  2:48 PM ------      Message from: Thornton Papas B      Created: Mon Aug 29, 2011  9:02 PM       Copy testerone and tsh to Dr. Jacky Kindle

## 2011-09-16 ENCOUNTER — Other Ambulatory Visit (HOSPITAL_BASED_OUTPATIENT_CLINIC_OR_DEPARTMENT_OTHER): Payer: Medicare Other | Admitting: Lab

## 2011-09-16 DIAGNOSIS — D696 Thrombocytopenia, unspecified: Secondary | ICD-10-CM

## 2011-09-16 LAB — CBC WITH DIFFERENTIAL/PLATELET
BASO%: 0.2 % (ref 0.0–2.0)
Basophils Absolute: 0 10*3/uL (ref 0.0–0.1)
EOS%: 1.7 % (ref 0.0–7.0)
HCT: 38.2 % — ABNORMAL LOW (ref 38.4–49.9)
HGB: 12.5 g/dL — ABNORMAL LOW (ref 13.0–17.1)
MCH: 29.4 pg (ref 27.2–33.4)
MCHC: 32.7 g/dL (ref 32.0–36.0)
MONO#: 2.5 10*3/uL — ABNORMAL HIGH (ref 0.1–0.9)
NEUT%: 35.2 % — ABNORMAL LOW (ref 39.0–75.0)
RDW: 15.4 % — ABNORMAL HIGH (ref 11.0–14.6)
WBC: 6.6 10*3/uL (ref 4.0–10.3)
lymph#: 1.7 10*3/uL (ref 0.9–3.3)

## 2011-09-19 ENCOUNTER — Telehealth: Payer: Self-pay | Admitting: *Deleted

## 2011-09-19 NOTE — Telephone Encounter (Signed)
Left message on voicemail for pt to call office. CBC looks OK, per Dr. Truett Perna. Follow up as scheduled.

## 2011-09-20 ENCOUNTER — Other Ambulatory Visit: Payer: Medicare Other | Admitting: Lab

## 2011-09-23 ENCOUNTER — Telehealth: Payer: Self-pay | Admitting: *Deleted

## 2011-09-23 NOTE — Telephone Encounter (Signed)
Called wife back and suggested she be sure he limits sun exposure and monitor for bleeding/bruising. Take him to urgent care or ER if she thinks it is low platelets. He will come to office on Monday for his lab work, but keep office visit on 6/11 as scheduled.

## 2011-09-23 NOTE — Telephone Encounter (Signed)
[place] the beach and she has noticed "spotting" on his legs and arms. Not sure if sun exposure or petechiae. Asking what she should do? Has appointment with Dr. Truett Perna on 09/27/11.

## 2011-09-25 ENCOUNTER — Other Ambulatory Visit: Payer: Self-pay | Admitting: Oncology

## 2011-09-25 DIAGNOSIS — D696 Thrombocytopenia, unspecified: Secondary | ICD-10-CM

## 2011-09-26 ENCOUNTER — Other Ambulatory Visit (HOSPITAL_BASED_OUTPATIENT_CLINIC_OR_DEPARTMENT_OTHER): Payer: Medicare Other | Admitting: Lab

## 2011-09-26 DIAGNOSIS — H2510 Age-related nuclear cataract, unspecified eye: Secondary | ICD-10-CM | POA: Diagnosis not present

## 2011-09-26 DIAGNOSIS — D6949 Other primary thrombocytopenia: Secondary | ICD-10-CM

## 2011-09-26 LAB — CBC WITH DIFFERENTIAL/PLATELET
BASO%: 0.3 % (ref 0.0–2.0)
Eosinophils Absolute: 0.1 10*3/uL (ref 0.0–0.5)
HCT: 35.7 % — ABNORMAL LOW (ref 38.4–49.9)
HGB: 11.8 g/dL — ABNORMAL LOW (ref 13.0–17.1)
LYMPH%: 27.5 % (ref 14.0–49.0)
MONO#: 1.5 10*3/uL — ABNORMAL HIGH (ref 0.1–0.9)
NEUT#: 1.8 10*3/uL (ref 1.5–6.5)
NEUT%: 37.8 % — ABNORMAL LOW (ref 39.0–75.0)
Platelets: 89 10*3/uL — ABNORMAL LOW (ref 140–400)
WBC: 4.7 10*3/uL (ref 4.0–10.3)
lymph#: 1.3 10*3/uL (ref 0.9–3.3)

## 2011-09-26 LAB — TECHNOLOGIST REVIEW

## 2011-09-27 ENCOUNTER — Ambulatory Visit (HOSPITAL_BASED_OUTPATIENT_CLINIC_OR_DEPARTMENT_OTHER): Payer: Medicare Other | Admitting: Oncology

## 2011-09-27 ENCOUNTER — Other Ambulatory Visit: Payer: Medicare Other

## 2011-09-27 ENCOUNTER — Telehealth: Payer: Self-pay | Admitting: Oncology

## 2011-09-27 VITALS — BP 116/68 | HR 85 | Temp 98.0°F | Ht 69.0 in | Wt 204.0 lb

## 2011-09-27 DIAGNOSIS — D696 Thrombocytopenia, unspecified: Secondary | ICD-10-CM | POA: Diagnosis not present

## 2011-09-27 NOTE — Progress Notes (Signed)
   [facility]    OFFICE PROGRESS NOTE   INTERVAL HISTORY:   He returns as scheduled. He reports marked improvement in the back pain.  He has been tapered off of prednisone. The leg edema has improved. The blood sugar is much lower. He is no longer taking insulin.  No fever. Good appetite and energy level.  Objective:  Vital signs in last 24 hours:  Blood pressure 116/68, pulse 85, temperature 98 F (36.7 C), temperature source Oral, height 5\' 9"  (1.753 m), weight 204 lb (92.534 kg).    HEENT: Neck without mass Lymphatics: No cervical, supraclavicular, axillary, or inguinal nodes Resp: Lungs clear bilaterally Cardio: Regular rate and rhythm GI: No hepatosplenomegaly Vascular: Trace edema at the right greater than left lower leg with chronic stasis changes at the right lower leg  Skin: Hyperpigmentation over the extremities     Lab Results:  Lab Results  Component Value Date   WBC 4.7 09/26/2011   HGB 11.8* 09/26/2011   HCT 35.7* 09/26/2011   MCV 91.0 09/26/2011   PLT 89* 09/26/2011   ANC 1.8    Medications: I have reviewed the patient's current medications.  Assessment/Plan: 1.History of splenomegaly - the spleen has been palpable on intermittent office visits over the past year. Splenomegaly was confirmed on an abdominal ultrasound 06/14/2010.  Elevated liver enzymes - persistent.  2.Pancytopenia.  3.History Systemic illness characterized by intermittent fever, weight loss, and malaise-improved.  4.History of coronary artery disease.  5.Bone marrow biopsy 11/27/2009 at [facility] and on 12/19/2009 at [facility] confirmed a hypercellular marrow with mild reticulin fibrosis and no evidence of malignancy.  6.Liver biopsy 02/10/2010 revealed minimal portal and lobular inflammation with evidence of steatosis.  7.Excisional biopsy of a right inguinal lymph node on 02/19/2010 at [facility] with the pathology revealing no evidence of a malignancy.    8.Hypothyroidism - maintained on thyroid hormone replacement.  Seroma at the right inguinal lymph node biopsy site - improved.  9.Erectile dysfunction - the testosterone level was low 05/13/2010.  10.Hepatomegaly on the abdominal ultrasound 06/14/2010.  11.Skin hyperpigmentation over the extremities.  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  16.T11 compression fracture per patient report. Status post a kyphoplasty procedure Dr. Newell Coral on 07/27/2011. The back pain has improved  Disposition:  The prednisone has been tapered off. Platelets are adequate. He will return for a monthly CBC. Nathan Kemp is scheduled for a 3 month office visit.  He knows to contact us for spontaneous bleeding or bruising.  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 Hopefully there will be a durable clinical remission following the steroid taper. We will consider rituximab or splenectomy for relapse of the ITP.   Thornton Papas, MD  09/27/2011  8:49 PM

## 2011-09-27 NOTE — Telephone Encounter (Signed)
appts made and printed for pt aom °

## 2011-09-30 ENCOUNTER — Other Ambulatory Visit: Payer: Self-pay | Admitting: *Deleted

## 2011-09-30 DIAGNOSIS — D696 Thrombocytopenia, unspecified: Secondary | ICD-10-CM

## 2011-10-05 ENCOUNTER — Telehealth: Payer: Self-pay | Admitting: Oncology

## 2011-10-05 NOTE — Telephone Encounter (Signed)
called pts home lmov[facility] for appt on 08/05. asked pt to rtn call to confirm appt

## 2011-10-11 DIAGNOSIS — M8448XA Pathological fracture, other site, initial encounter for fracture: Secondary | ICD-10-CM | POA: Diagnosis not present

## 2011-10-24 ENCOUNTER — Telehealth: Payer: Self-pay | Admitting: *Deleted

## 2011-10-24 ENCOUNTER — Other Ambulatory Visit (HOSPITAL_BASED_OUTPATIENT_CLINIC_OR_DEPARTMENT_OTHER): Payer: Medicare Other

## 2011-10-24 DIAGNOSIS — D696 Thrombocytopenia, unspecified: Secondary | ICD-10-CM

## 2011-10-24 LAB — TECHNOLOGIST REVIEW

## 2011-10-24 LAB — CBC WITH DIFFERENTIAL/PLATELET
BASO%: 0.3 % (ref 0.0–2.0)
Eosinophils Absolute: 0.1 10*3/uL (ref 0.0–0.5)
MCHC: 33.1 g/dL (ref 32.0–36.0)
MONO#: 2.2 10*3/uL — ABNORMAL HIGH (ref 0.1–0.9)
NEUT#: 1.8 10*3/uL (ref 1.5–6.5)
Platelets: 97 10*3/uL — ABNORMAL LOW (ref 140–400)
RBC: 4.1 10*6/uL — ABNORMAL LOW (ref 4.20–5.82)
WBC: 5.2 10*3/uL (ref 4.0–10.3)
lymph#: 1.1 10*3/uL (ref 0.9–3.3)

## 2011-10-24 NOTE — Telephone Encounter (Signed)
Called pt with lab results. Stable, follow up as scheduled, per Dr. Truett Perna. Pt voiced understanding.

## 2011-11-11 ENCOUNTER — Other Ambulatory Visit: Payer: Self-pay | Admitting: Dermatology

## 2011-11-11 DIAGNOSIS — C44721 Squamous cell carcinoma of skin of unspecified lower limb, including hip: Secondary | ICD-10-CM | POA: Diagnosis not present

## 2011-11-11 DIAGNOSIS — Q828 Other specified congenital malformations of skin: Secondary | ICD-10-CM | POA: Diagnosis not present

## 2011-11-11 DIAGNOSIS — L578 Other skin changes due to chronic exposure to nonionizing radiation: Secondary | ICD-10-CM | POA: Diagnosis not present

## 2011-11-11 DIAGNOSIS — L570 Actinic keratosis: Secondary | ICD-10-CM | POA: Diagnosis not present

## 2011-11-18 DIAGNOSIS — D696 Thrombocytopenia, unspecified: Secondary | ICD-10-CM | POA: Diagnosis not present

## 2011-11-18 DIAGNOSIS — E039 Hypothyroidism, unspecified: Secondary | ICD-10-CM | POA: Diagnosis not present

## 2011-11-18 DIAGNOSIS — I10 Essential (primary) hypertension: Secondary | ICD-10-CM | POA: Diagnosis not present

## 2011-11-18 DIAGNOSIS — E119 Type 2 diabetes mellitus without complications: Secondary | ICD-10-CM | POA: Diagnosis not present

## 2011-11-21 ENCOUNTER — Other Ambulatory Visit (HOSPITAL_BASED_OUTPATIENT_CLINIC_OR_DEPARTMENT_OTHER): Payer: Medicare Other | Admitting: Lab

## 2011-11-21 ENCOUNTER — Other Ambulatory Visit: Payer: Self-pay | Admitting: *Deleted

## 2011-11-21 DIAGNOSIS — D696 Thrombocytopenia, unspecified: Secondary | ICD-10-CM

## 2011-11-21 DIAGNOSIS — C4490 Unspecified malignant neoplasm of skin, unspecified: Secondary | ICD-10-CM

## 2011-11-21 LAB — CBC WITH DIFFERENTIAL/PLATELET
Basophils Absolute: 0 10*3/uL (ref 0.0–0.1)
EOS%: 1 % (ref 0.0–7.0)
Eosinophils Absolute: 0.1 10*3/uL (ref 0.0–0.5)
HCT: 37.2 % — ABNORMAL LOW (ref 38.4–49.9)
HGB: 12.4 g/dL — ABNORMAL LOW (ref 13.0–17.1)
MONO#: 2.4 10*3/uL — ABNORMAL HIGH (ref 0.1–0.9)
NEUT#: 2.1 10*3/uL (ref 1.5–6.5)
NEUT%: 36.7 % — ABNORMAL LOW (ref 39.0–75.0)
RDW: 15.3 % — ABNORMAL HIGH (ref 11.0–14.6)
WBC: 5.8 10*3/uL (ref 4.0–10.3)
lymph#: 1.2 10*3/uL (ref 0.9–3.3)

## 2011-11-25 ENCOUNTER — Other Ambulatory Visit: Payer: Self-pay | Admitting: *Deleted

## 2011-11-28 DIAGNOSIS — M8448XA Pathological fracture, other site, initial encounter for fracture: Secondary | ICD-10-CM | POA: Diagnosis not present

## 2011-12-01 ENCOUNTER — Telehealth: Payer: Self-pay | Admitting: *Deleted

## 2011-12-01 NOTE — Telephone Encounter (Signed)
Wife called to ask if Dr. Truett Perna thinks it would be OK for patient to take L-arginiae 1000 mg supplement that was suggested by a friend. Also request name of the oncologist in [facility] area they could call on if he became ill while on vacation there.

## 2011-12-02 ENCOUNTER — Other Ambulatory Visit: Payer: Self-pay | Admitting: Neurosurgery

## 2011-12-02 ENCOUNTER — Telehealth: Payer: Self-pay | Admitting: *Deleted

## 2011-12-02 DIAGNOSIS — M5137 Other intervertebral disc degeneration, lumbosacral region: Secondary | ICD-10-CM

## 2011-12-02 DIAGNOSIS — M47817 Spondylosis without myelopathy or radiculopathy, lumbosacral region: Secondary | ICD-10-CM

## 2011-12-02 DIAGNOSIS — M8448XA Pathological fracture, other site, initial encounter for fracture: Secondary | ICD-10-CM

## 2011-12-02 NOTE — Telephone Encounter (Signed)
Left VM that MD does not think the L-arginiae would be harmful, but doubts it would be of any benefit. [place] is Darren Sabino Gasser with [facility] in [place] area.

## 2011-12-08 ENCOUNTER — Telehealth: Payer: Self-pay | Admitting: *Deleted

## 2011-12-08 NOTE — Telephone Encounter (Signed)
Received call from pt wife stating that pt developed "rash over night from left chest area down to his ribs"  Described as "fiery red" and has put cortisone cream as well as taking Benadryl po.  Returned call to pt wife, per Dr. Truett Perna continue to use the cortisone cream/ Benadryl and if not improved to go to clinic at [facility] for evaluation.  Pt wife verbalized understanding and also states that rash has improved since the am.  Pt has not been in the sun and has not been on any new meds per wife.

## 2011-12-12 ENCOUNTER — Ambulatory Visit
Admission: RE | Admit: 2011-12-12 | Discharge: 2011-12-12 | Disposition: A | Discharge status: Home / Self Care | Payer: Medicare Other | Source: Ambulatory Visit | Attending: Neurosurgery | Admitting: Neurosurgery

## 2011-12-12 DIAGNOSIS — IMO0002 Reserved for concepts with insufficient information to code with codable children: Secondary | ICD-10-CM | POA: Diagnosis not present

## 2011-12-12 DIAGNOSIS — M5124 Other intervertebral disc displacement, thoracic region: Secondary | ICD-10-CM | POA: Diagnosis not present

## 2011-12-12 DIAGNOSIS — M8448XA Pathological fracture, other site, initial encounter for fracture: Secondary | ICD-10-CM

## 2011-12-12 DIAGNOSIS — M47817 Spondylosis without myelopathy or radiculopathy, lumbosacral region: Secondary | ICD-10-CM

## 2011-12-12 DIAGNOSIS — M5137 Other intervertebral disc degeneration, lumbosacral region: Secondary | ICD-10-CM

## 2011-12-15 DIAGNOSIS — M8448XA Pathological fracture, other site, initial encounter for fracture: Secondary | ICD-10-CM | POA: Diagnosis not present

## 2011-12-23 ENCOUNTER — Other Ambulatory Visit: Payer: Self-pay | Admitting: Dermatology

## 2011-12-23 DIAGNOSIS — H61009 Unspecified perichondritis of external ear, unspecified ear: Secondary | ICD-10-CM | POA: Diagnosis not present

## 2011-12-23 DIAGNOSIS — L819 Disorder of pigmentation, unspecified: Secondary | ICD-10-CM | POA: Diagnosis not present

## 2011-12-23 DIAGNOSIS — Z85828 Personal history of other malignant neoplasm of skin: Secondary | ICD-10-CM | POA: Diagnosis not present

## 2011-12-26 ENCOUNTER — Ambulatory Visit (HOSPITAL_BASED_OUTPATIENT_CLINIC_OR_DEPARTMENT_OTHER): Payer: Medicare Other | Admitting: Oncology

## 2011-12-26 ENCOUNTER — Other Ambulatory Visit: Payer: Self-pay | Admitting: *Deleted

## 2011-12-26 ENCOUNTER — Ambulatory Visit (HOSPITAL_BASED_OUTPATIENT_CLINIC_OR_DEPARTMENT_OTHER): Payer: Medicare Other | Admitting: Lab

## 2011-12-26 ENCOUNTER — Telehealth: Payer: Self-pay | Admitting: Oncology

## 2011-12-26 VITALS — BP 130/77 | HR 83 | Temp 97.8°F | Resp 20

## 2011-12-26 DIAGNOSIS — R609 Edema, unspecified: Secondary | ICD-10-CM

## 2011-12-26 DIAGNOSIS — R161 Splenomegaly, not elsewhere classified: Secondary | ICD-10-CM | POA: Diagnosis not present

## 2011-12-26 DIAGNOSIS — D696 Thrombocytopenia, unspecified: Secondary | ICD-10-CM

## 2011-12-26 DIAGNOSIS — D61818 Other pancytopenia: Secondary | ICD-10-CM

## 2011-12-26 DIAGNOSIS — C4490 Unspecified malignant neoplasm of skin, unspecified: Secondary | ICD-10-CM | POA: Diagnosis not present

## 2011-12-26 DIAGNOSIS — M8448XA Pathological fracture, other site, initial encounter for fracture: Secondary | ICD-10-CM

## 2011-12-26 LAB — CBC WITH DIFFERENTIAL/PLATELET
Basophils Absolute: 0 10*3/uL (ref 0.0–0.1)
Eosinophils Absolute: 0.1 10*3/uL (ref 0.0–0.5)
HCT: 36.4 % — ABNORMAL LOW (ref 38.4–49.9)
HGB: 12.1 g/dL — ABNORMAL LOW (ref 13.0–17.1)
NEUT#: 2.1 10*3/uL (ref 1.5–6.5)
NEUT%: 38 % — ABNORMAL LOW (ref 39.0–75.0)
RDW: 14.3 % (ref 11.0–14.6)
lymph#: 1.5 10*3/uL (ref 0.9–3.3)

## 2011-12-26 LAB — TESTOSTERONE: Testosterone: 130.58 ng/dL — ABNORMAL LOW (ref 300–890)

## 2011-12-26 MED ORDER — NITROGLYCERIN 0.4 MG SL SUBL: 0.4000 mg | SUBLINGUAL_TABLET | SUBLINGUAL | Status: DC | PRN

## 2011-12-26 NOTE — Telephone Encounter (Signed)
Received request from pharmacy, refill completed for nitro.

## 2011-12-26 NOTE — Progress Notes (Signed)
   [facility]    OFFICE PROGRESS NOTE   INTERVAL HISTORY:   He returns as scheduled. He had a recent "rash" related to an insect bite. He continues to have malaise, but this has improved. No fever. No palpable lymph nodes. There was a nodular area at the right parotid. This has resolved.  He continues followup with Dr. Newell Coral for treatment of vertebral compression fractures.  Objective:  Vital signs in last 24 hours:  Blood pressure 130/77, pulse 83, temperature 97.8 F (36.6 C), temperature source Oral, resp. rate 20.    HEENT: Face/neck without mass, bandage at the left ear Lymphatics: No cervical, supraclavicular, axillary, or inguinal nodes Resp: Inspiratory rhonchi at the low posterior chest bilaterally, no respiratory distress Cardio: Regular rate and rhythm GI: No hepatosplenomegaly Vascular: No leg edema   Lab Results:  Lab Results  Component Value Date   WBC 5.5 12/26/2011   HGB 12.1* 12/26/2011   HCT 36.4* 12/26/2011   MCV 91.5 12/26/2011   PLT 107* 12/26/2011   ANC 2.1    Medications: I have reviewed the patient's current medications.  Assessment/Plan: 1.History of splenomegaly - the spleen has been palpable on intermittent office visits over the past year. Splenomegaly was confirmed on an abdominal ultrasound 06/14/2010.  2.Elevated liver enzymes. 3.Pancytopenia.  4.History Systemic illness characterized by intermittent fever, weight loss, and malaise-improved.  5.History of coronary artery disease.  6.Bone marrow biopsy 11/27/2009 at the [facility] and on 12/19/2009 at [facility] confirmed a hypercellular marrow with mild reticulin fibrosis and no evidence of malignancy.  7.Liver biopsy 02/10/2010 revealed minimal portal and lobular inflammation with evidence of steatosis.  8.Excisional biopsy of a right inguinal lymph node on 02/19/2010 at [facility] with the pathology revealing no evidence of a malignancy.  9.Hypothyroidism - maintained on  thyroid hormone replacement.  Seroma at the right inguinal lymph node biopsy site - improved.  10.Erectile dysfunction - the testosterone level was low 05/13/2010.  11.Hepatomegaly on the abdominal ultrasound 06/14/2010.  12.Skin hyperpigmentation over the extremities.  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  17.T11 compression fracture. Status post a kyphoplasty procedure Dr. Newell Coral on 07/27/2011. The back pain has improved    Disposition:  The platelet count is stable off of prednisone. No clinical evidence for progression of the non-Hodgkin's lymphoma. He will return for a CBC in 6 weeks. He is scheduled for an office visit in 3 months. He requested we obtain a testosterone level today.  Mr. Hippe plans to schedule an influenza vaccine. Dr. Lanell Matar office.   Thornton Papas, MD  12/26/2011  8:41 PM

## 2011-12-26 NOTE — Telephone Encounter (Signed)
appts made and printed for pt aom °

## 2011-12-26 NOTE — Patient Instructions (Addendum)
DENT PLANTZ [birth year] [id number]  [facility] Discharge Instructions  Your exam findings, labs and results were discussed with your MD today.  Filed Vitals:   12/26/11 0924  BP: 130/77  Pulse: 83  Temp: 97.8 F (36.6 C)  Resp: 20   Current outpatient prescriptions:diphenhydrAMINE (BENADRYL) 25 MG tablet, Take 50 mg by mouth at bedtime. , Disp: , Rfl: ;  hydrocodone-acetaminophen (LORCET-HD) 5-500 MG per capsule, Take 1-2 capsules by mouth every 6 (six) hours as needed. For pain, Disp: , Rfl: ;  levothyroxine (SYNTHROID, LEVOTHROID) 100 MCG tablet, Take 100 mcg by mouth daily., Disp: , Rfl:  Loteprednol Etabonate 0.5 % OINT, Place 1 application into both eyes 2 (two) times daily. , Disp: , Rfl: ;  nitroGLYCERIN (NITROSTAT) 0.4 MG SL tablet, Place 0.4 mg under the tongue every 5 (five) minutes as needed. Chest pain, Disp: , Rfl: ;  olmesartan-hydrochlorothiazide (BENICAR HCT) 40-25 MG per tablet, Take 0.5 tablets by mouth every morning. , Disp: , Rfl: ;  pantoprazole (PROTONIX) 20 MG tablet, Take 20 mg by mouth daily., Disp: , Rfl:  insulin regular (NOVOLIN R,HUMULIN R) 100 units/mL injection, Inject 10 Units into the skin 3 (three) times daily as needed. For glucose greater than 150., Disp: , Rfl: ;  zolpidem (AMBIEN) 10 MG tablet, Take 5 mg by mouth at bedtime. , Disp: , Rfl:   Please visit scheduling to obtain calendar for future appointments.  Please call the [facility] at [phone] during business hours should you have any further questions or need assistance in obtaining follow-up care. If you have a medical emergency, please dial [phone].  Special Instructions:

## 2011-12-27 ENCOUNTER — Telehealth: Payer: Self-pay | Admitting: *Deleted

## 2011-12-27 NOTE — Telephone Encounter (Signed)
Call from pt's wife asking to schedule flu shot. Per Misty Stanley, PharmD: We are giving flu shots to chemo patients only at this time. This info given to Mrs. Tally, she states will contact PCP for flu shot.

## 2012-01-10 DIAGNOSIS — Z23 Encounter for immunization: Secondary | ICD-10-CM | POA: Diagnosis not present

## 2012-01-17 DIAGNOSIS — I10 Essential (primary) hypertension: Secondary | ICD-10-CM | POA: Diagnosis not present

## 2012-01-17 DIAGNOSIS — G250 Essential tremor: Secondary | ICD-10-CM | POA: Diagnosis not present

## 2012-01-17 DIAGNOSIS — E039 Hypothyroidism, unspecified: Secondary | ICD-10-CM | POA: Diagnosis not present

## 2012-01-17 DIAGNOSIS — G252 Other specified forms of tremor: Secondary | ICD-10-CM | POA: Diagnosis not present

## 2012-01-17 DIAGNOSIS — IMO0001 Reserved for inherently not codable concepts without codable children: Secondary | ICD-10-CM | POA: Diagnosis not present

## 2012-01-18 ENCOUNTER — Telehealth: Payer: Self-pay | Admitting: *Deleted

## 2012-01-18 NOTE — Telephone Encounter (Signed)
Left VM for wife to call office regarding labs. Copy forwarded to Dr. Jacky Kindle.

## 2012-01-19 ENCOUNTER — Telehealth: Payer: Self-pay | Admitting: *Deleted

## 2012-01-19 NOTE — Telephone Encounter (Signed)
erroneous

## 2012-01-20 ENCOUNTER — Telehealth: Payer: Self-pay | Admitting: Medical Oncology

## 2012-01-20 NOTE — Telephone Encounter (Signed)
I left a message on Jo's mobile phone -informed her of testosterone level and that a TSH was not done.

## 2012-01-23 ENCOUNTER — Telehealth: Payer: Self-pay | Admitting: *Deleted

## 2012-01-23 NOTE — Telephone Encounter (Signed)
Wife called to report Hurschel has been diagnosed with polymyalgia. Dr. Jacky Kindle is suggesting Prednisone to treat. Want to be sure this is cleared with Dr. Truett Perna.

## 2012-01-24 NOTE — Telephone Encounter (Signed)
Left VM for wife that Dr. Truett Perna said OK to steroids for polymyalgia. Be sure to keep close check on his CBG's.

## 2012-01-25 DIAGNOSIS — E119 Type 2 diabetes mellitus without complications: Secondary | ICD-10-CM | POA: Diagnosis not present

## 2012-01-25 DIAGNOSIS — M353 Polymyalgia rheumatica: Secondary | ICD-10-CM | POA: Diagnosis not present

## 2012-01-25 DIAGNOSIS — C8585 Other specified types of non-Hodgkin lymphoma, lymph nodes of inguinal region and lower limb: Secondary | ICD-10-CM | POA: Diagnosis not present

## 2012-01-25 DIAGNOSIS — E039 Hypothyroidism, unspecified: Secondary | ICD-10-CM | POA: Diagnosis not present

## 2012-01-30 DIAGNOSIS — H2510 Age-related nuclear cataract, unspecified eye: Secondary | ICD-10-CM | POA: Diagnosis not present

## 2012-02-06 ENCOUNTER — Other Ambulatory Visit (HOSPITAL_BASED_OUTPATIENT_CLINIC_OR_DEPARTMENT_OTHER): Payer: Medicare Other

## 2012-02-06 DIAGNOSIS — D696 Thrombocytopenia, unspecified: Secondary | ICD-10-CM | POA: Diagnosis not present

## 2012-02-06 LAB — CBC WITH DIFFERENTIAL/PLATELET
Basophils Absolute: 0.1 10*3/uL (ref 0.0–0.1)
EOS%: 0.6 % (ref 0.0–7.0)
Eosinophils Absolute: 0.1 10*3/uL (ref 0.0–0.5)
HCT: 40.2 % (ref 38.4–49.9)
HGB: 13.3 g/dL (ref 13.0–17.1)
MCH: 30.4 pg (ref 27.2–33.4)
MCV: 91.6 fL (ref 79.3–98.0)
MONO%: 38.4 % — ABNORMAL HIGH (ref 0.0–14.0)
NEUT#: 4.7 10*3/uL (ref 1.5–6.5)
NEUT%: 47.7 % (ref 39.0–75.0)
RDW: 15 % — ABNORMAL HIGH (ref 11.0–14.6)

## 2012-02-07 ENCOUNTER — Telehealth: Payer: Self-pay | Admitting: Oncology

## 2012-02-07 NOTE — Telephone Encounter (Signed)
Pt called and r/s appt from 12/9 to 12/16 lab and MD

## 2012-02-08 ENCOUNTER — Telehealth: Payer: Self-pay | Admitting: *Deleted

## 2012-02-08 NOTE — Telephone Encounter (Signed)
Left CBC results on his home voice mail. Follow up as scheduled.

## 2012-02-10 DIAGNOSIS — M8448XA Pathological fracture, other site, initial encounter for fracture: Secondary | ICD-10-CM | POA: Diagnosis not present

## 2012-02-20 DIAGNOSIS — E119 Type 2 diabetes mellitus without complications: Secondary | ICD-10-CM | POA: Diagnosis not present

## 2012-02-20 DIAGNOSIS — M353 Polymyalgia rheumatica: Secondary | ICD-10-CM | POA: Diagnosis not present

## 2012-02-20 DIAGNOSIS — S32009A Unspecified fracture of unspecified lumbar vertebra, initial encounter for closed fracture: Secondary | ICD-10-CM | POA: Diagnosis not present

## 2012-02-20 DIAGNOSIS — H61009 Unspecified perichondritis of external ear, unspecified ear: Secondary | ICD-10-CM | POA: Diagnosis not present

## 2012-03-19 DIAGNOSIS — E119 Type 2 diabetes mellitus without complications: Secondary | ICD-10-CM | POA: Diagnosis not present

## 2012-03-19 DIAGNOSIS — Z125 Encounter for screening for malignant neoplasm of prostate: Secondary | ICD-10-CM | POA: Diagnosis not present

## 2012-03-19 DIAGNOSIS — E039 Hypothyroidism, unspecified: Secondary | ICD-10-CM | POA: Diagnosis not present

## 2012-03-19 DIAGNOSIS — I10 Essential (primary) hypertension: Secondary | ICD-10-CM | POA: Diagnosis not present

## 2012-03-22 DIAGNOSIS — Z1212 Encounter for screening for malignant neoplasm of rectum: Secondary | ICD-10-CM | POA: Diagnosis not present

## 2012-03-26 ENCOUNTER — Ambulatory Visit: Payer: Medicare Other | Admitting: Oncology

## 2012-03-26 ENCOUNTER — Other Ambulatory Visit: Payer: Medicare Other | Admitting: Lab

## 2012-03-26 DIAGNOSIS — I10 Essential (primary) hypertension: Secondary | ICD-10-CM | POA: Diagnosis not present

## 2012-03-26 DIAGNOSIS — Z0000 Encounter for general adult medical examination without abnormal findings: Secondary | ICD-10-CM | POA: Diagnosis not present

## 2012-03-26 DIAGNOSIS — E119 Type 2 diabetes mellitus without complications: Secondary | ICD-10-CM | POA: Diagnosis not present

## 2012-03-26 DIAGNOSIS — E039 Hypothyroidism, unspecified: Secondary | ICD-10-CM | POA: Diagnosis not present

## 2012-04-02 ENCOUNTER — Other Ambulatory Visit: Payer: Medicare Other | Admitting: Lab

## 2012-04-02 ENCOUNTER — Ambulatory Visit (HOSPITAL_BASED_OUTPATIENT_CLINIC_OR_DEPARTMENT_OTHER): Payer: Medicare Other | Admitting: Oncology

## 2012-04-02 ENCOUNTER — Telehealth: Payer: Self-pay | Admitting: Oncology

## 2012-04-02 VITALS — BP 150/87 | HR 86 | Temp 97.2°F | Resp 20 | Ht 69.0 in | Wt 205.5 lb

## 2012-04-02 DIAGNOSIS — D696 Thrombocytopenia, unspecified: Secondary | ICD-10-CM | POA: Diagnosis not present

## 2012-04-02 LAB — CBC WITH DIFFERENTIAL/PLATELET
Eosinophils Absolute: 0 10*3/uL (ref 0.0–0.5)
HCT: 39.8 % (ref 38.4–49.9)
LYMPH%: 17.4 % (ref 14.0–49.0)
MCHC: 33.9 g/dL (ref 32.0–36.0)
MCV: 92.2 fL (ref 79.3–98.0)
MONO#: 1.4 10*3/uL — ABNORMAL HIGH (ref 0.1–0.9)
MONO%: 21.3 % — ABNORMAL HIGH (ref 0.0–14.0)
NEUT#: 3.9 10*3/uL (ref 1.5–6.5)
NEUT%: 60.4 % (ref 39.0–75.0)
Platelets: 126 10*3/uL — ABNORMAL LOW (ref 140–400)
RBC: 4.32 10*6/uL (ref 4.20–5.82)
WBC: 6.4 10*3/uL (ref 4.0–10.3)

## 2012-04-02 NOTE — Progress Notes (Signed)
   [facility]    OFFICE PROGRESS NOTE   INTERVAL HISTORY:   He returns as scheduled. The back pain is much improved. No fever. Good energy level. No palpable lymph nodes. No bleeding. He was recently diagnosed with polymyalgia rheumatica and is maintained on low-dose prednisone.  Objective:  Vital signs in last 24 hours:  Blood pressure 150/87, pulse 86, temperature 97.2 F (36.2 C), temperature source Oral, resp. rate 20, height 5\' 9"  (1.753 m), weight 205 lb 8 oz (93.214 kg).    HEENT: Neck without mass Lymphatics: No cervical, supraclavicular, axillary, or inguinal nodes. Prominent rightward left axillary fat pad. Resp: Lungs clear bilaterally Cardio: Regular rate and rhythm GI: No hepatosplenomegaly Vascular: Trace edema at the right lower leg      Lab Results:  Lab Results  Component Value Date   WBC 6.4 04/02/2012   HGB 13.5 04/02/2012   HCT 39.8 04/02/2012   MCV 92.2 04/02/2012   PLT 126* 04/02/2012   ANC 3.9    Medications: I have reviewed the patient's current medications.  Assessment/Plan: 1.History of splenomegaly - the spleen has been palpable on intermittent office visits over the past year. Splenomegaly was confirmed on an abdominal ultrasound 06/14/2010.  2.Elevated liver enzymes.  3.History of Pancytopenia.  4.History Systemic illness characterized by intermittent fever, weight loss, and malaise-improved.  5.History of coronary artery disease.  6.Bone marrow biopsy 11/27/2009 at the [facility] and on 12/19/2009 at [facility] confirmed a hypercellular marrow with mild reticulin fibrosis and no evidence of malignancy.  7.Liver biopsy 02/10/2010 revealed minimal portal and lobular inflammation with evidence of steatosis.  8.Excisional biopsy of a right inguinal lymph node on 02/19/2010 at [facility] with the pathology revealing no evidence of a malignancy.  9.Hypothyroidism - maintained on thyroid hormone replacement.  10.Erectile  dysfunction - the testosterone level was low 05/13/2010.  11.Hepatomegaly on the abdominal ultrasound 06/14/2010.  12.Skin hyperpigmentation over the extremities.  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 He completed a prednisone taper in May of 2013 , now on low-dose prednisone for treatment of p.m. 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  16.T11 compression fracture. Status post a kyphoplasty procedure Dr. Newell Coral on 07/27/2011. The back pain has improved  17. Recent diagnosis of PMR-managed by Dr. Jacky Kindle  Disposition:  He is stable from a hematologic standpoint. No clinical evidence for progression of non-Hodgkin's lymphoma. The platelet count remains adequate. Mr. Clarin will contact Dr. Jacky Kindle to discuss tapering of the prednisone. He will return for an office visit and CBC in 4 months.   Thornton Papas, MD  04/02/2012  5:57 PM

## 2012-04-02 NOTE — Telephone Encounter (Signed)
Gave pt appt calendar  for April 14/14 lab and MD

## 2012-05-04 DIAGNOSIS — R05 Cough: Secondary | ICD-10-CM | POA: Diagnosis not present

## 2012-05-04 DIAGNOSIS — I10 Essential (primary) hypertension: Secondary | ICD-10-CM | POA: Diagnosis not present

## 2012-05-04 DIAGNOSIS — M353 Polymyalgia rheumatica: Secondary | ICD-10-CM | POA: Diagnosis not present

## 2012-05-07 DIAGNOSIS — H6120 Impacted cerumen, unspecified ear: Secondary | ICD-10-CM | POA: Diagnosis not present

## 2012-05-17 DIAGNOSIS — C8585 Other specified types of non-Hodgkin lymphoma, lymph nodes of inguinal region and lower limb: Secondary | ICD-10-CM | POA: Diagnosis not present

## 2012-05-17 DIAGNOSIS — E119 Type 2 diabetes mellitus without complications: Secondary | ICD-10-CM | POA: Diagnosis not present

## 2012-05-17 DIAGNOSIS — M353 Polymyalgia rheumatica: Secondary | ICD-10-CM | POA: Diagnosis not present

## 2012-05-17 DIAGNOSIS — E669 Obesity, unspecified: Secondary | ICD-10-CM | POA: Diagnosis not present

## 2012-06-12 DIAGNOSIS — M8448XA Pathological fracture, other site, initial encounter for fracture: Secondary | ICD-10-CM | POA: Diagnosis not present

## 2012-07-23 DIAGNOSIS — E669 Obesity, unspecified: Secondary | ICD-10-CM | POA: Diagnosis not present

## 2012-07-23 DIAGNOSIS — K219 Gastro-esophageal reflux disease without esophagitis: Secondary | ICD-10-CM | POA: Diagnosis not present

## 2012-07-23 DIAGNOSIS — M353 Polymyalgia rheumatica: Secondary | ICD-10-CM | POA: Diagnosis not present

## 2012-07-23 DIAGNOSIS — I10 Essential (primary) hypertension: Secondary | ICD-10-CM | POA: Diagnosis not present

## 2012-07-23 DIAGNOSIS — E039 Hypothyroidism, unspecified: Secondary | ICD-10-CM | POA: Diagnosis not present

## 2012-07-23 DIAGNOSIS — C8585 Other specified types of non-Hodgkin lymphoma, lymph nodes of inguinal region and lower limb: Secondary | ICD-10-CM | POA: Diagnosis not present

## 2012-07-23 DIAGNOSIS — R609 Edema, unspecified: Secondary | ICD-10-CM | POA: Diagnosis not present

## 2012-07-23 DIAGNOSIS — E119 Type 2 diabetes mellitus without complications: Secondary | ICD-10-CM | POA: Diagnosis not present

## 2012-07-27 ENCOUNTER — Other Ambulatory Visit: Payer: Self-pay | Admitting: *Deleted

## 2012-07-27 DIAGNOSIS — D696 Thrombocytopenia, unspecified: Secondary | ICD-10-CM

## 2012-07-30 ENCOUNTER — Other Ambulatory Visit (HOSPITAL_BASED_OUTPATIENT_CLINIC_OR_DEPARTMENT_OTHER): Payer: Medicare Other | Admitting: Lab

## 2012-07-30 ENCOUNTER — Telehealth: Payer: Self-pay | Admitting: Oncology

## 2012-07-30 ENCOUNTER — Ambulatory Visit (HOSPITAL_BASED_OUTPATIENT_CLINIC_OR_DEPARTMENT_OTHER): Payer: Medicare Other | Admitting: Oncology

## 2012-07-30 VITALS — BP 143/81 | HR 72 | Temp 97.2°F | Resp 18 | Ht 69.0 in | Wt 216.2 lb

## 2012-07-30 DIAGNOSIS — Z87898 Personal history of other specified conditions: Secondary | ICD-10-CM

## 2012-07-30 DIAGNOSIS — E139 Other specified diabetes mellitus without complications: Secondary | ICD-10-CM | POA: Diagnosis not present

## 2012-07-30 DIAGNOSIS — D696 Thrombocytopenia, unspecified: Secondary | ICD-10-CM

## 2012-07-30 DIAGNOSIS — M353 Polymyalgia rheumatica: Secondary | ICD-10-CM

## 2012-07-30 LAB — CBC WITH DIFFERENTIAL/PLATELET
Basophils Absolute: 0 10*3/uL (ref 0.0–0.1)
EOS%: 1 % (ref 0.0–7.0)
HCT: 41 % (ref 38.4–49.9)
HGB: 13.9 g/dL (ref 13.0–17.1)
MCH: 32 pg (ref 27.2–33.4)
MCHC: 33.9 g/dL (ref 32.0–36.0)
MCV: 94.4 fL (ref 79.3–98.0)
MONO%: 27.3 % — ABNORMAL HIGH (ref 0.0–14.0)
NEUT%: 52.3 % (ref 39.0–75.0)

## 2012-07-30 NOTE — Telephone Encounter (Signed)
gv and printed appt calander and avs for pt....pt aware

## 2012-07-30 NOTE — Progress Notes (Signed)
    [facility]    OFFICE PROGRESS NOTE   INTERVAL HISTORY:   He returns as scheduled. The back pain has improved. His energy level is good. Good appetite. He complains of pain related to polymyalgia rheumatica. The pain progressed when he attempted to taper off of prednisone. He is now taking prednisone at a dose of 10 mg daily. No fever. The blood sugar has not been markedly elevated. Objective:  Vital signs in last 24 hours:  Blood pressure 143/81, pulse 72, temperature 97.2 F (36.2 C), temperature source Oral, resp. rate 18, height 5\' 9"  (1.753 m), weight 216 lb 3.2 oz (98.068 kg).    HEENT: Neck without mass Lymphatics: No cervical, supra-clavicular, axillary, or inguinal nodes. Prominent right greater than left axillary fat pad Resp: Lungs clear bilaterally Cardio: Regular rate and rhythm GI: No hepatosplenomegaly Vascular: Trace edema at the right lower leg  Skin: Hyperpigmentation at the right lower leg     Lab Results:  Lab Results  Component Value Date   WBC 8.7 07/30/2012   HGB 13.9 07/30/2012   HCT 41.0 07/30/2012   MCV 94.4 07/30/2012   PLT 189 07/30/2012   ANC 4.5    Medications: I have reviewed the patient's current medications.  Assessment/Plan: 1.History of splenomegaly - the spleen has been palpable on intermittent office visits over the past year. Splenomegaly was confirmed on an abdominal ultrasound 06/14/2010.  2.Elevated liver enzymes.  3.History of Pancytopenia.  4.History Systemic illness characterized by intermittent fever, weight loss, and malaise-improved.  5.History of coronary artery disease.  6.Bone marrow biopsy 11/27/2009 at the [facility] and on 12/19/2009 at [facility] confirmed a hypercellular marrow with mild reticulin fibrosis and no evidence of malignancy.  7.Liver biopsy 02/10/2010 revealed minimal portal and lobular inflammation with evidence of steatosis.  8.Excisional biopsy of a right inguinal lymph node  on 02/19/2010 at [facility] with the pathology revealing no evidence of a malignancy.  9.Hypothyroidism - maintained on thyroid hormone replacement.  10.Erectile dysfunction - the testosterone level was low 05/13/2010.  11.Hepatomegaly on the abdominal ultrasound 06/14/2010.  12.Skin hyperpigmentation over the extremities.  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  16.T11 compression fracture. Status post a kyphoplasty procedure Dr. Newell Coral on 07/27/2011. The back pain has improved  17. PMR-maintained on prednisone and followed by Dr. 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 No evidence for progression of the low-grade non-Hodgkin's lymphoma. He will return for an office visit and CBC in 6 months. He will followup with Dr. Jacky Kindle for tapering of the prednisone.   Thornton Papas, MD  07/30/2012  6:16 PM

## 2012-08-06 DIAGNOSIS — M1990 Unspecified osteoarthritis, unspecified site: Secondary | ICD-10-CM | POA: Diagnosis not present

## 2012-08-06 DIAGNOSIS — M19049 Primary osteoarthritis, unspecified hand: Secondary | ICD-10-CM | POA: Diagnosis not present

## 2012-08-16 DIAGNOSIS — H2510 Age-related nuclear cataract, unspecified eye: Secondary | ICD-10-CM | POA: Diagnosis not present

## 2012-08-20 ENCOUNTER — Other Ambulatory Visit: Payer: Self-pay | Admitting: Dermatology

## 2012-08-20 DIAGNOSIS — Q828 Other specified congenital malformations of skin: Secondary | ICD-10-CM | POA: Diagnosis not present

## 2012-08-20 DIAGNOSIS — D2370 Other benign neoplasm of skin of unspecified lower limb, including hip: Secondary | ICD-10-CM | POA: Diagnosis not present

## 2012-08-20 DIAGNOSIS — D1801 Hemangioma of skin and subcutaneous tissue: Secondary | ICD-10-CM | POA: Diagnosis not present

## 2012-08-20 DIAGNOSIS — C4442 Squamous cell carcinoma of skin of scalp and neck: Secondary | ICD-10-CM | POA: Diagnosis not present

## 2012-08-20 DIAGNOSIS — L819 Disorder of pigmentation, unspecified: Secondary | ICD-10-CM | POA: Diagnosis not present

## 2012-08-20 DIAGNOSIS — Z85828 Personal history of other malignant neoplasm of skin: Secondary | ICD-10-CM | POA: Diagnosis not present

## 2012-08-20 DIAGNOSIS — L821 Other seborrheic keratosis: Secondary | ICD-10-CM | POA: Diagnosis not present

## 2012-08-20 DIAGNOSIS — L578 Other skin changes due to chronic exposure to nonionizing radiation: Secondary | ICD-10-CM | POA: Diagnosis not present

## 2012-08-27 DIAGNOSIS — R197 Diarrhea, unspecified: Secondary | ICD-10-CM | POA: Diagnosis not present

## 2012-08-28 DIAGNOSIS — C44221 Squamous cell carcinoma of skin of unspecified ear and external auricular canal: Secondary | ICD-10-CM | POA: Diagnosis not present

## 2012-08-31 DIAGNOSIS — M19049 Primary osteoarthritis, unspecified hand: Secondary | ICD-10-CM | POA: Diagnosis not present

## 2012-09-11 DIAGNOSIS — L570 Actinic keratosis: Secondary | ICD-10-CM | POA: Diagnosis not present

## 2012-09-21 DIAGNOSIS — M19049 Primary osteoarthritis, unspecified hand: Secondary | ICD-10-CM | POA: Diagnosis not present

## 2012-10-08 DIAGNOSIS — M19049 Primary osteoarthritis, unspecified hand: Secondary | ICD-10-CM | POA: Diagnosis not present

## 2012-10-10 DIAGNOSIS — R197 Diarrhea, unspecified: Secondary | ICD-10-CM | POA: Diagnosis not present

## 2012-10-16 DIAGNOSIS — R0982 Postnasal drip: Secondary | ICD-10-CM | POA: Diagnosis not present

## 2012-10-16 DIAGNOSIS — R05 Cough: Secondary | ICD-10-CM | POA: Diagnosis not present

## 2012-10-16 DIAGNOSIS — M353 Polymyalgia rheumatica: Secondary | ICD-10-CM | POA: Diagnosis not present

## 2012-11-16 DIAGNOSIS — E119 Type 2 diabetes mellitus without complications: Secondary | ICD-10-CM | POA: Diagnosis not present

## 2012-11-16 DIAGNOSIS — E669 Obesity, unspecified: Secondary | ICD-10-CM | POA: Diagnosis not present

## 2012-11-16 DIAGNOSIS — C8585 Other specified types of non-Hodgkin lymphoma, lymph nodes of inguinal region and lower limb: Secondary | ICD-10-CM | POA: Diagnosis not present

## 2012-11-16 DIAGNOSIS — Z6832 Body mass index (BMI) 32.0-32.9, adult: Secondary | ICD-10-CM | POA: Diagnosis not present

## 2012-11-16 DIAGNOSIS — I10 Essential (primary) hypertension: Secondary | ICD-10-CM | POA: Diagnosis not present

## 2012-11-16 DIAGNOSIS — R5381 Other malaise: Secondary | ICD-10-CM | POA: Diagnosis not present

## 2012-11-16 DIAGNOSIS — E039 Hypothyroidism, unspecified: Secondary | ICD-10-CM | POA: Diagnosis not present

## 2012-11-16 DIAGNOSIS — Z1331 Encounter for screening for depression: Secondary | ICD-10-CM | POA: Diagnosis not present

## 2012-11-26 DIAGNOSIS — H2510 Age-related nuclear cataract, unspecified eye: Secondary | ICD-10-CM | POA: Diagnosis not present

## 2012-12-06 ENCOUNTER — Telehealth: Payer: Self-pay | Admitting: *Deleted

## 2012-12-06 NOTE — Telephone Encounter (Signed)
Message from pt's wife requesting sooner appt. Will be out of town next week. Please call if there is an availability on 8/22 or after labor day. MD made aware. Will arrange for Sept appt.

## 2012-12-13 ENCOUNTER — Telehealth: Payer: Self-pay | Admitting: Oncology

## 2012-12-13 NOTE — Telephone Encounter (Signed)
lmonvm for pt re appt for 9/12 @ 11am. D/t/duration per staff message from [facility]. Schedule mailed.

## 2012-12-18 ENCOUNTER — Other Ambulatory Visit: Payer: Self-pay | Admitting: *Deleted

## 2012-12-21 ENCOUNTER — Telehealth: Payer: Self-pay | Admitting: Oncology

## 2012-12-21 NOTE — Telephone Encounter (Signed)
Pt  called , returned call pt is aware of appt on 9/12

## 2012-12-24 DIAGNOSIS — D1801 Hemangioma of skin and subcutaneous tissue: Secondary | ICD-10-CM | POA: Diagnosis not present

## 2012-12-24 DIAGNOSIS — D239 Other benign neoplasm of skin, unspecified: Secondary | ICD-10-CM | POA: Diagnosis not present

## 2012-12-24 DIAGNOSIS — L570 Actinic keratosis: Secondary | ICD-10-CM | POA: Diagnosis not present

## 2012-12-24 DIAGNOSIS — L821 Other seborrheic keratosis: Secondary | ICD-10-CM | POA: Diagnosis not present

## 2012-12-24 DIAGNOSIS — Z85828 Personal history of other malignant neoplasm of skin: Secondary | ICD-10-CM | POA: Diagnosis not present

## 2012-12-24 DIAGNOSIS — D692 Other nonthrombocytopenic purpura: Secondary | ICD-10-CM | POA: Diagnosis not present

## 2012-12-24 DIAGNOSIS — D2320 Other benign neoplasm of skin of unspecified ear and external auricular canal: Secondary | ICD-10-CM | POA: Diagnosis not present

## 2012-12-25 ENCOUNTER — Telehealth: Payer: Self-pay | Admitting: Oncology

## 2012-12-25 NOTE — Telephone Encounter (Signed)
Gave pt appt for lab and MD , emailed Marcelino Duster regarding chemo on October after MD visit

## 2012-12-28 ENCOUNTER — Ambulatory Visit: Payer: Medicare Other | Admitting: Oncology

## 2012-12-31 DIAGNOSIS — H2510 Age-related nuclear cataract, unspecified eye: Secondary | ICD-10-CM | POA: Diagnosis not present

## 2013-01-21 DIAGNOSIS — H2510 Age-related nuclear cataract, unspecified eye: Secondary | ICD-10-CM | POA: Diagnosis not present

## 2013-01-21 DIAGNOSIS — H2589 Other age-related cataract: Secondary | ICD-10-CM | POA: Diagnosis not present

## 2013-01-21 DIAGNOSIS — H269 Unspecified cataract: Secondary | ICD-10-CM | POA: Diagnosis not present

## 2013-01-24 DIAGNOSIS — M353 Polymyalgia rheumatica: Secondary | ICD-10-CM | POA: Diagnosis not present

## 2013-01-24 DIAGNOSIS — Z6832 Body mass index (BMI) 32.0-32.9, adult: Secondary | ICD-10-CM | POA: Diagnosis not present

## 2013-01-24 DIAGNOSIS — I10 Essential (primary) hypertension: Secondary | ICD-10-CM | POA: Diagnosis not present

## 2013-01-24 DIAGNOSIS — E119 Type 2 diabetes mellitus without complications: Secondary | ICD-10-CM | POA: Diagnosis not present

## 2013-01-24 DIAGNOSIS — M25519 Pain in unspecified shoulder: Secondary | ICD-10-CM | POA: Diagnosis not present

## 2013-01-28 ENCOUNTER — Encounter (INDEPENDENT_AMBULATORY_CARE_PROVIDER_SITE_OTHER): Payer: Self-pay

## 2013-01-28 ENCOUNTER — Other Ambulatory Visit (HOSPITAL_BASED_OUTPATIENT_CLINIC_OR_DEPARTMENT_OTHER): Payer: Medicare Other | Admitting: Lab

## 2013-01-28 ENCOUNTER — Ambulatory Visit (HOSPITAL_BASED_OUTPATIENT_CLINIC_OR_DEPARTMENT_OTHER): Payer: Medicare Other | Admitting: Oncology

## 2013-01-28 ENCOUNTER — Telehealth: Payer: Self-pay | Admitting: Oncology

## 2013-01-28 VITALS — BP 145/76 | HR 73 | Temp 97.8°F | Resp 20 | Ht 69.0 in | Wt 216.1 lb

## 2013-01-28 DIAGNOSIS — R197 Diarrhea, unspecified: Secondary | ICD-10-CM

## 2013-01-28 DIAGNOSIS — M25519 Pain in unspecified shoulder: Secondary | ICD-10-CM

## 2013-01-28 DIAGNOSIS — D696 Thrombocytopenia, unspecified: Secondary | ICD-10-CM | POA: Diagnosis not present

## 2013-01-28 DIAGNOSIS — H2510 Age-related nuclear cataract, unspecified eye: Secondary | ICD-10-CM | POA: Diagnosis not present

## 2013-01-28 DIAGNOSIS — R161 Splenomegaly, not elsewhere classified: Secondary | ICD-10-CM

## 2013-01-28 LAB — CBC WITH DIFFERENTIAL/PLATELET
BASO%: 0.6 % (ref 0.0–2.0)
EOS%: 0.9 % (ref 0.0–7.0)
MCH: 31.7 pg (ref 27.2–33.4)
MCHC: 33.7 g/dL (ref 32.0–36.0)
MCV: 94 fL (ref 79.3–98.0)
MONO%: 34.4 % — ABNORMAL HIGH (ref 0.0–14.0)
RBC: 4.51 10*6/uL (ref 4.20–5.82)
RDW: 14.7 % — ABNORMAL HIGH (ref 11.0–14.6)

## 2013-01-28 NOTE — Progress Notes (Signed)
[facility]    OFFICE PROGRESS NOTE   INTERVAL HISTORY:   She returns for scheduled visit. The past several months he has "arthritis "discomfort at the left hand and right shoulder. He received a steroid injection at the right shoulder on 01/24/2013. The shoulder pain has improved. He complains of malaise. He has frequent stools and diarrhea after eating. He is seeing Dr. Madilyn Fireman later today. Nathan Kemp underwent left cataract surgery on 01/21/2013. He is being scheduled for right cataract surgery. No fever, night sweats, or anorexia. No palpable lymph nodes. He started a trial of AndroGel for the malaise.  Objective:  Vital signs in last 24 hours:  Blood pressure 145/76, pulse 73, temperature 97.8 F (36.6 C), temperature source Oral, resp. rate 20, height 5\' 9"  (1.753 m), weight 216 lb 1.6 oz (98.022 kg).    HEENT: No thrush Lymphatics: No cervical, supraclavicular, axillary, or inguinal nodes. Prominent bilateral axillary fat pads. Resp: Decreased breath sounds at the right base, no respiratory distress Cardio: Regular rate and rhythm GI: No hepatosplenomegaly Vascular: Trace edema at the right lower leg.  Skin: Brown discoloration at the lower legs bilaterally     Lab Results:  Lab Results  Component Value Date   WBC 9.5 01/28/2013   HGB 14.3 01/28/2013   HCT 42.4 01/28/2013   MCV 94.0 01/28/2013   PLT 136* 01/28/2013   ANC 4.4    Medications: I have reviewed the patient's current medications.  Assessment/Plan: 1.History of splenomegaly - the spleen has been palpable on intermittent office visits over the past year. Splenomegaly was confirmed on an abdominal ultrasound 06/14/2010.  2.history of Elevated liver enzymes.  3.History of Pancytopenia.  4.History Systemic illness characterized by intermittent fever, weight loss, and malaise-improved.  5.History of coronary artery disease.  6.Bone marrow biopsy 11/27/2009 at the [facility]  and on 12/19/2009 at [facility] confirmed a hypercellular marrow with mild reticulin fibrosis and no evidence of malignancy.  7.Liver biopsy 02/10/2010 revealed minimal portal and lobular inflammation with evidence of steatosis.  8.Excisional biopsy of a right inguinal lymph node on 02/19/2010 at [facility] with the pathology revealing no evidence of a malignancy.  9.Hypothyroidism - maintained on thyroid hormone replacement.  10.Erectile dysfunction - the testosterone level was low 05/13/2010.  11.Hepatomegaly on the abdominal ultrasound 06/14/2010.  12.Skin hyperpigmentation over the extremities.  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  16.T11 compression fracture. Status post a kyphoplasty procedure Dr. Newell Coral on 07/27/2011. The back pain has improved  17. PMR-maintained on prednisone and followed by Dr. Jacky Kindle    Disposition:  [name] continues to have multiple symptoms. It is unclear whether the symptom complex is related to an underlying low-grade non-Hodgkin's lymphoma. There is no clear evidence for progression of the lymphoma. He plans to see Dr. Amanda Pea if the shoulder pain persists.  [name] will return for an office and lab visit, to include an LDH, in 2 months. We will consider obtaining restaging CT scans  if the systemic symptoms progress.   Thornton Papas, MD  01/28/2013  9:30 AM

## 2013-01-28 NOTE — Telephone Encounter (Signed)
gave pt appt for lab, and MD on 03/25/13 per Md

## 2013-02-11 DIAGNOSIS — H269 Unspecified cataract: Secondary | ICD-10-CM | POA: Diagnosis not present

## 2013-02-11 DIAGNOSIS — H2589 Other age-related cataract: Secondary | ICD-10-CM | POA: Diagnosis not present

## 2013-02-11 DIAGNOSIS — H2510 Age-related nuclear cataract, unspecified eye: Secondary | ICD-10-CM | POA: Diagnosis not present

## 2013-02-25 DIAGNOSIS — H6120 Impacted cerumen, unspecified ear: Secondary | ICD-10-CM | POA: Diagnosis not present

## 2013-03-18 DIAGNOSIS — R197 Diarrhea, unspecified: Secondary | ICD-10-CM | POA: Diagnosis not present

## 2013-03-20 DIAGNOSIS — M19049 Primary osteoarthritis, unspecified hand: Secondary | ICD-10-CM | POA: Diagnosis not present

## 2013-03-21 ENCOUNTER — Other Ambulatory Visit: Payer: Self-pay | Admitting: Cardiovascular Disease

## 2013-03-25 ENCOUNTER — Other Ambulatory Visit (HOSPITAL_BASED_OUTPATIENT_CLINIC_OR_DEPARTMENT_OTHER): Payer: Medicare Other | Admitting: Lab

## 2013-03-25 ENCOUNTER — Ambulatory Visit (HOSPITAL_BASED_OUTPATIENT_CLINIC_OR_DEPARTMENT_OTHER): Payer: Medicare Other | Admitting: Oncology

## 2013-03-25 ENCOUNTER — Telehealth: Payer: Self-pay | Admitting: Oncology

## 2013-03-25 VITALS — BP 139/78 | HR 73 | Temp 97.0°F | Resp 20 | Ht 69.0 in | Wt 219.8 lb

## 2013-03-25 DIAGNOSIS — E039 Hypothyroidism, unspecified: Secondary | ICD-10-CM

## 2013-03-25 DIAGNOSIS — R5381 Other malaise: Secondary | ICD-10-CM | POA: Diagnosis not present

## 2013-03-25 DIAGNOSIS — R160 Hepatomegaly, not elsewhere classified: Secondary | ICD-10-CM | POA: Diagnosis not present

## 2013-03-25 DIAGNOSIS — D696 Thrombocytopenia, unspecified: Secondary | ICD-10-CM

## 2013-03-25 DIAGNOSIS — R161 Splenomegaly, not elsewhere classified: Secondary | ICD-10-CM | POA: Diagnosis not present

## 2013-03-25 LAB — CBC WITH DIFFERENTIAL/PLATELET
BASO%: 0.4 % (ref 0.0–2.0)
Basophils Absolute: 0 10*3/uL (ref 0.0–0.1)
Eosinophils Absolute: 0.1 10*3/uL (ref 0.0–0.5)
HCT: 45.2 % (ref 38.4–49.9)
HGB: 15.2 g/dL (ref 13.0–17.1)
LYMPH%: 14.4 % (ref 14.0–49.0)
MCH: 32.4 pg (ref 27.2–33.4)
MCHC: 33.6 g/dL (ref 32.0–36.0)
MCV: 96.2 fL (ref 79.3–98.0)
NEUT#: 6.1 10*3/uL (ref 1.5–6.5)
NEUT%: 58.1 % (ref 39.0–75.0)
Platelets: 144 10*3/uL (ref 140–400)
RBC: 4.7 10*6/uL (ref 4.20–5.82)
RDW: 14.9 % — ABNORMAL HIGH (ref 11.0–14.6)
lymph#: 1.5 10*3/uL (ref 0.9–3.3)

## 2013-03-25 LAB — COMPREHENSIVE METABOLIC PANEL (CC13)
ALT: 33 U/L (ref 0–55)
AST: 18 U/L (ref 5–34)
Albumin: 4.6 g/dL (ref 3.5–5.0)
Anion Gap: 12 mEq/L — ABNORMAL HIGH (ref 3–11)
BUN: 17.3 mg/dL (ref 7.0–26.0)
Calcium: 10.1 mg/dL (ref 8.4–10.4)
Chloride: 104 mEq/L (ref 98–109)
Creatinine: 0.9 mg/dL (ref 0.7–1.3)
Sodium: 146 mEq/L — ABNORMAL HIGH (ref 136–145)
Total Bilirubin: 0.49 mg/dL (ref 0.20–1.20)
Total Protein: 7.9 g/dL (ref 6.4–8.3)

## 2013-03-25 LAB — TSH CHCC: TSH: 2.549 m(IU)/L (ref 0.320–4.118)

## 2013-03-25 NOTE — Progress Notes (Signed)
    [facility]    OFFICE PROGRESS NOTE   INTERVAL HISTORY:   Mr. Cupps returns for scheduled followup thrombocytopenia and non-Hodgkin's lymphoma. He reports discomfort at the right shoulder improved after a steroid injection at Dr. Lanell Matar office. He has orthopedics appointment this week. He continues to have malaise. Androgel did not help. No fever, night sweats, or anorexia. No palpable lymph nodes. He continues to feel better compared to when he presented several years ago. He has been unable to wean the prednisone to off. He continues to have arthralgias with the prednisone dose is decreased.  Objective:  Vital signs in last 24 hours:  Blood pressure 139/78, pulse 73, temperature 97 F (36.1 C), temperature source Oral, resp. rate 20, height 5\' 9"  (1.753 m), weight 219 lb 12.8 oz (99.701 kg).    HEENT: Neck without mass Lymphatics: No cervical, supraclavicular, axillary, or inguinal nodes Resp: Lungs clear bilaterally Cardio: Regular rate and rhythm GI: No hepatosplenomegaly Vascular: Trace edema at the right lower leg  Skin: Brown discoloration at the legs bilaterally     Lab Results:  Lab Results  Component Value Date   WBC 10.5* 03/25/2013   HGB 15.2 03/25/2013   HCT 45.2 03/25/2013   MCV 96.2 03/25/2013   PLT 144 03/25/2013   ANC 6.1    Medications: I have reviewed the patient's current medications.  Assessment/Plan: 1.History of splenomegaly - the spleen has been palpable on intermittent office visits over the past year. Splenomegaly was confirmed on an abdominal ultrasound 06/14/2010.  2.history of Elevated liver enzymes.  3.History of Pancytopenia.  4.History of a Systemic illness characterized by intermittent fever, weight loss, and malaise-improved.  5.History of coronary artery disease.  6.Bone marrow biopsy 11/27/2009 at the [facility] and on 12/19/2009 at [facility] confirmed a hypercellular marrow with mild reticulin fibrosis  and no evidence of malignancy.  7.Liver biopsy 02/10/2010 revealed minimal portal and lobular inflammation with evidence of steatosis.  8.Excisional biopsy of a right inguinal lymph node on 02/19/2010 at [facility] with the pathology revealing no evidence of a malignancy.  9.Hypothyroidism - maintained on thyroid hormone replacement.  10.Erectile dysfunction - the testosterone level was low 05/13/2010.  11.Hepatomegaly on the abdominal ultrasound 06/14/2010.  12.Skin hyperpigmentation over the extremities.  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  16.T11 compression fracture. Status post a kyphoplasty procedure Dr. Newell Coral on 07/27/2011. The back pain has improved  17. PMR-maintained on prednisone and followed by Dr. Jacky Kindle     Disposition:  Mr. Dobbs appears stable. The platelet count is in the low normal range and there is no clinical evidence for progression of the lymphoma. He will return for an office visit and CBC in 4 months.   Thornton Papas, MD  03/25/2013  11:17 AM

## 2013-03-25 NOTE — Telephone Encounter (Signed)
Gave pt appt for lab and MD and April 2014

## 2013-03-28 DIAGNOSIS — M19049 Primary osteoarthritis, unspecified hand: Secondary | ICD-10-CM | POA: Diagnosis not present

## 2013-03-28 DIAGNOSIS — M542 Cervicalgia: Secondary | ICD-10-CM | POA: Diagnosis not present

## 2013-03-28 DIAGNOSIS — M1990 Unspecified osteoarthritis, unspecified site: Secondary | ICD-10-CM | POA: Diagnosis not present

## 2013-04-01 DIAGNOSIS — M542 Cervicalgia: Secondary | ICD-10-CM | POA: Diagnosis not present

## 2013-04-08 DIAGNOSIS — Z0000 Encounter for general adult medical examination without abnormal findings: Secondary | ICD-10-CM | POA: Diagnosis not present

## 2013-04-08 DIAGNOSIS — Z79899 Other long term (current) drug therapy: Secondary | ICD-10-CM | POA: Diagnosis not present

## 2013-04-08 DIAGNOSIS — Z6833 Body mass index (BMI) 33.0-33.9, adult: Secondary | ICD-10-CM | POA: Diagnosis not present

## 2013-04-08 DIAGNOSIS — M353 Polymyalgia rheumatica: Secondary | ICD-10-CM | POA: Diagnosis not present

## 2013-04-08 DIAGNOSIS — D696 Thrombocytopenia, unspecified: Secondary | ICD-10-CM | POA: Diagnosis not present

## 2013-04-08 DIAGNOSIS — E119 Type 2 diabetes mellitus without complications: Secondary | ICD-10-CM | POA: Diagnosis not present

## 2013-04-08 DIAGNOSIS — E039 Hypothyroidism, unspecified: Secondary | ICD-10-CM | POA: Diagnosis not present

## 2013-04-08 DIAGNOSIS — Z125 Encounter for screening for malignant neoplasm of prostate: Secondary | ICD-10-CM | POA: Diagnosis not present

## 2013-04-08 DIAGNOSIS — K219 Gastro-esophageal reflux disease without esophagitis: Secondary | ICD-10-CM | POA: Diagnosis not present

## 2013-04-08 DIAGNOSIS — E669 Obesity, unspecified: Secondary | ICD-10-CM | POA: Diagnosis not present

## 2013-04-08 DIAGNOSIS — Z23 Encounter for immunization: Secondary | ICD-10-CM | POA: Diagnosis not present

## 2013-04-15 DIAGNOSIS — Z1212 Encounter for screening for malignant neoplasm of rectum: Secondary | ICD-10-CM | POA: Diagnosis not present

## 2013-04-29 ENCOUNTER — Ambulatory Visit (INDEPENDENT_AMBULATORY_CARE_PROVIDER_SITE_OTHER): Payer: Medicare Other | Admitting: Cardiovascular Disease

## 2013-04-29 ENCOUNTER — Other Ambulatory Visit: Payer: Self-pay | Admitting: Dermatology

## 2013-04-29 ENCOUNTER — Encounter: Payer: Self-pay | Admitting: Cardiovascular Disease

## 2013-04-29 VITALS — BP 130/66 | HR 72 | Ht 69.0 in | Wt 222.0 lb

## 2013-04-29 DIAGNOSIS — C44621 Squamous cell carcinoma of skin of unspecified upper limb, including shoulder: Secondary | ICD-10-CM | POA: Diagnosis not present

## 2013-04-29 DIAGNOSIS — L570 Actinic keratosis: Secondary | ICD-10-CM | POA: Diagnosis not present

## 2013-04-29 DIAGNOSIS — I2510 Atherosclerotic heart disease of native coronary artery without angina pectoris: Secondary | ICD-10-CM

## 2013-04-29 DIAGNOSIS — R197 Diarrhea, unspecified: Secondary | ICD-10-CM | POA: Diagnosis not present

## 2013-04-29 DIAGNOSIS — Q828 Other specified congenital malformations of skin: Secondary | ICD-10-CM | POA: Diagnosis not present

## 2013-04-29 DIAGNOSIS — L821 Other seborrheic keratosis: Secondary | ICD-10-CM | POA: Diagnosis not present

## 2013-04-29 DIAGNOSIS — L708 Other acne: Secondary | ICD-10-CM | POA: Diagnosis not present

## 2013-04-29 DIAGNOSIS — R0609 Other forms of dyspnea: Secondary | ICD-10-CM

## 2013-04-29 DIAGNOSIS — Z85828 Personal history of other malignant neoplasm of skin: Secondary | ICD-10-CM | POA: Diagnosis not present

## 2013-04-29 DIAGNOSIS — R0989 Other specified symptoms and signs involving the circulatory and respiratory systems: Secondary | ICD-10-CM

## 2013-04-29 NOTE — Assessment & Plan Note (Signed)
Nathan Kemp  presents today for symptoms that are worrisome for angina. He has shortness breath with exertion and also some dizziness. These symptoms are similar to his episodes prior to his stent in 2006.  He's been on chronic prednisone therapy for the past several years and has gained quite a bit of weight. This may be the cause of his shortness breath with exertion. He also has not been exercising. I've encouraged him to work on a regular exercise program. He will also be working on a low-fat low-carb diet.  We'll schedule him for an echocardiogram for further evaluation of his left ventricular function and we'll also schedule him for a Lexiscan Myoview for further evaluation of coronary artery disease and ischemia. I do not think that he would be able to walk on a treadmill at this point because of his severe dyspnea.  I'll see him in the office in several months for followup visit.

## 2013-04-29 NOTE — Progress Notes (Signed)
Nathan Kemp Date of Birth  07-28-1940 [facility] [address]    [place], [place]  [address] [phone]  Fax  [phone]   Problem List 1. CAD - PCI of RCA 2006.  He had a 3.5 x 24 mm Liberte stent placed. We used a 4.5 mm noncompliant balloon for post dilatation. 2. Polymyalgia rheumatica 3. History of thrombocytopenia - ? Hx of Non-hodgkins lymphma 4. Irritable bowel syndrome 5. Hypothyroidism 6. Hypertension 7. Diabetes mellitus  History of Present Illness:  73 year old gentleman with a history of coronary artery disease. He has a history of PTCA and stenting of his right coronary artery in 2006.  He had a 3.5 x 24 mm Liberte stent placed. We used a 4.5 mm noncompliant balloon for post dilatation.   He's done well from a cardiac standpoint since that time.  He's not had any episodes of chest pain or shortness breath.  I saw him last year for anorexia and weight loss. He also had some thrombocytopenia. He's had a very extensive workup at [facility] and at the [facility]. So far they've not found any clear-cut etiology for his low platelet counts and liver function abnormalities.  He has generally improved since that time. The plan now is to continue to observe but without any further testing until he develops any other symptoms.  Jan. 12, 2015:  Nathan Kemp was last seen in Sept. 2012: Nathan Kemp has been having some DOE - climbing stairs.   Has been present 4-5 weeks.  Also c/o fatigue, lack of energy.    Also has occasional episodes of dizziness.   He thinks the symptoms are similar to his angina prior to his stenting.  He is active ( [employer]).  No CP, no syncope.    Also has been diagnosed with PMR.     He had an episode of Thrombocytopenia and required platelet transfusion.  He has been on prednisone since that time.     Current Outpatient Prescriptions on File Prior to Visit  Medication Sig Dispense Refill  . acetaminophen (TYLENOL  ARTHRITIS PAIN) 650 MG CR tablet Take 650 mg by mouth 3 (three) times daily.       . cholestyramine (QUESTRAN) 4 G packet 4 g daily.       Marland Kitchen dicyclomine (BENTYL) 20 MG tablet       . diphenhydrAMINE (BENADRYL) 25 MG tablet Take 50 mg by mouth at bedtime.       . insulin regular (NOVOLIN R,HUMULIN R) 100 units/mL injection Inject 6 Units into the skin 3 (three) times daily as needed. For glucose greater than 175      . levothyroxine (SYNTHROID, LEVOTHROID) 100 MCG tablet Take 100 mcg by mouth daily.      Marland Kitchen NITROSTAT 0.4 MG SL tablet PLACE 1 TABLET UNDER THE TONGUE EVERY 5 MINUTES AS NEEDED FOR CHEST PAIN  25 tablet  0  . olmesartan-hydrochlorothiazide (BENICAR HCT) 40-25 MG per tablet Take 0.5 tablets by mouth every morning.       [name] Glycol-Propyl Glycol (SYSTANE ULTRA OP) Apply 1-2 drops to eye as needed.      . zolpidem (AMBIEN) 10 MG tablet Take 10 mg by mouth at bedtime.        No current facility-administered medications on file prior to visit.    Allergies  Allergen Reactions  . Cefdinir Hives  . Adhesive [Tape] Other (See Comments)    blisters  . Lortab [Hydrocodone-Acetaminophen] Other (See Comments)  Makes patient very loopy. Update   Able to take now  . Niaspan [Niacin] Hives  . Clindamycin Nausea Only and Other (See Comments)    Stomach cramps  . Codeine Rash    Had rash previously but not recently-taken in cough medicine and no problems but patient can tolerate    Past Medical History  Diagnosis Date  . Anorexia   . Thrombocytopenia   . Fatigue   . Hypertension   . Hyperlipidemia   . Vitamin D deficiency   . Obesity   . DDD (degenerative disc disease), cervical   . Thyroid disease   . Trouble swallowing   . Cough   . Leg swelling     due to lymphedema  . Hypothyroidism   . Diabetes mellitus     borderline-controlled by diet  . Acute GI bleeding 04/16/2011  . Coronary artery disease     stent 06  . Heart murmur   . Asthma     when  have  congstion  of chest  . Pneumonia   . Anemia   . Blood dyscrasia 06/2009    thrombocytopenia  . Blood transfusion     platelets 12/12  . GERD (gastroesophageal reflux disease)   . 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 ED (erectile dysfunction)     Past Surgical History  Procedure Laterality Date  . Cardiac catheterization  01/31/2005    EF 60-65%  . Coronary angioplasty with stent placement  2006    STENTING OF HIS CORONARY ARTERY  . Appendectomy  1967  . US echocardiography  02/28/2005    EF 55-60%  . Tonsillectomy and adenoidectomy    . Nasal sinus surgery  2005  . Edg dilitation  [id number]  . Lymph node removal  2011    right groin  . Bone marrow biopsy      2011-at [facility], 01/2010-[facility] [place], [facility]  . Lymph node biopsy  03/15/2011    Procedure: LYMPH NODE BIOPSY;  Surgeon: Earnstine Regal, MD;  Location: [facility];  Service: General;  Laterality: Right;  Right Anterior Cervical Lymph Node Excisional Biopsy   . 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 Never Smoker   Smokeless tobacco  . Never Used    History  Alcohol Use No    Family History  Problem Relation Age of Onset  . Heart failure Father   . Hypertension Father   . Hearing loss Father 65    congestive heart failure  . Diabetes Father   . Hypertension Mother   . Stroke Mother 107    Reviw of Systems:  Reviewed in the HPI.  All other systems are negative.  Physical Exam: BP 130/66  Pulse 72  Ht '5\' 9"'  (1.753 m)  Wt 222 lb (100.699 kg)  BMI 32.77 kg/m2 The patient is alert and oriented x 3.  The mood and affect are normal.   Skin: warm and dry.  Color is normal.    HEENT:   the sclera are nonicteric.  The mucous membranes are moist.  The carotids are 2+ without bruits.  There is no thyromegaly.  There is no JVD.    Lungs: clear.  The chest wall is non tender.    Heart: regular rate with a normal S1 and S2.    Occasional premature beatsThere is a soft  systolic murmur. The PMI is not displaced.     Abdomen: good bowel sounds.  There is no guarding or rebound.  There is no hepatosplenomegaly or tenderness.  There are no masses.   Extremities:  There  Is trace edema and chronic stasis changes on left side.  The right lower leg has 1+.    The distal pulses are intact.   Neuro:  Cranial nerves II - XII are intact.  Motor and sensory functions are intact.    The gait is normal.  ECG: Jan. 12, 2015:  NSR at 73, RAD Assessment / Plan:

## 2013-04-29 NOTE — Patient Instructions (Signed)
1) Your physician has requested that you have a lexiscan myoview. . Please follow instruction sheet, as given.  2) Your physician has requested that you have an echocardiogram. Echocardiography is a painless test that uses sound waves to create images of your heart. It provides your doctor with information about the size and shape of your heart and how well your heart's chambers and valves are working. This procedure takes approximately one hour. There are no restrictions for this procedure.  Your physician recommends that you schedule a follow-up appointment in: 2-3 MONTHS

## 2013-05-03 ENCOUNTER — Ambulatory Visit (HOSPITAL_COMMUNITY): Payer: Medicare Other | Attending: Cardiovascular Disease | Admitting: Radiology

## 2013-05-03 DIAGNOSIS — I059 Rheumatic mitral valve disease, unspecified: Secondary | ICD-10-CM | POA: Diagnosis not present

## 2013-05-03 DIAGNOSIS — R0989 Other specified symptoms and signs involving the circulatory and respiratory systems: Secondary | ICD-10-CM | POA: Insufficient documentation

## 2013-05-03 DIAGNOSIS — E669 Obesity, unspecified: Secondary | ICD-10-CM | POA: Diagnosis not present

## 2013-05-03 DIAGNOSIS — I2510 Atherosclerotic heart disease of native coronary artery without angina pectoris: Secondary | ICD-10-CM | POA: Diagnosis not present

## 2013-05-03 DIAGNOSIS — I10 Essential (primary) hypertension: Secondary | ICD-10-CM | POA: Insufficient documentation

## 2013-05-03 DIAGNOSIS — R0602 Shortness of breath: Secondary | ICD-10-CM | POA: Diagnosis not present

## 2013-05-03 DIAGNOSIS — R0609 Other forms of dyspnea: Secondary | ICD-10-CM | POA: Insufficient documentation

## 2013-05-03 DIAGNOSIS — Z6832 Body mass index (BMI) 32.0-32.9, adult: Secondary | ICD-10-CM | POA: Diagnosis not present

## 2013-05-03 DIAGNOSIS — R011 Cardiac murmur, unspecified: Secondary | ICD-10-CM | POA: Diagnosis not present

## 2013-05-03 DIAGNOSIS — I359 Nonrheumatic aortic valve disorder, unspecified: Secondary | ICD-10-CM | POA: Diagnosis not present

## 2013-05-03 DIAGNOSIS — E119 Type 2 diabetes mellitus without complications: Secondary | ICD-10-CM | POA: Diagnosis not present

## 2013-05-03 NOTE — Progress Notes (Signed)
Echocardiogram performed.  

## 2013-05-06 ENCOUNTER — Telehealth: Payer: Self-pay | Admitting: Cardiovascular Disease

## 2013-05-06 ENCOUNTER — Encounter: Payer: Self-pay | Admitting: Cardiovascular Disease

## 2013-05-06 ENCOUNTER — Ambulatory Visit (HOSPITAL_COMMUNITY): Payer: Medicare Other | Attending: Cardiovascular Disease | Admitting: Radiology

## 2013-05-06 VITALS — BP 139/86 | Ht 69.0 in | Wt 220.0 lb

## 2013-05-06 DIAGNOSIS — Z9861 Coronary angioplasty status: Secondary | ICD-10-CM | POA: Insufficient documentation

## 2013-05-06 DIAGNOSIS — R0609 Other forms of dyspnea: Secondary | ICD-10-CM | POA: Diagnosis not present

## 2013-05-06 DIAGNOSIS — Z794 Long term (current) use of insulin: Secondary | ICD-10-CM | POA: Insufficient documentation

## 2013-05-06 DIAGNOSIS — R42 Dizziness and giddiness: Secondary | ICD-10-CM | POA: Diagnosis not present

## 2013-05-06 DIAGNOSIS — I491 Atrial premature depolarization: Secondary | ICD-10-CM

## 2013-05-06 DIAGNOSIS — I10 Essential (primary) hypertension: Secondary | ICD-10-CM | POA: Insufficient documentation

## 2013-05-06 DIAGNOSIS — E119 Type 2 diabetes mellitus without complications: Secondary | ICD-10-CM | POA: Insufficient documentation

## 2013-05-06 DIAGNOSIS — J45909 Unspecified asthma, uncomplicated: Secondary | ICD-10-CM | POA: Insufficient documentation

## 2013-05-06 DIAGNOSIS — I4949 Other premature depolarization: Secondary | ICD-10-CM | POA: Diagnosis not present

## 2013-05-06 DIAGNOSIS — R0989 Other specified symptoms and signs involving the circulatory and respiratory systems: Secondary | ICD-10-CM | POA: Diagnosis not present

## 2013-05-06 DIAGNOSIS — E785 Hyperlipidemia, unspecified: Secondary | ICD-10-CM | POA: Diagnosis not present

## 2013-05-06 DIAGNOSIS — R002 Palpitations: Secondary | ICD-10-CM | POA: Insufficient documentation

## 2013-05-06 DIAGNOSIS — I2510 Atherosclerotic heart disease of native coronary artery without angina pectoris: Secondary | ICD-10-CM

## 2013-05-06 MED ORDER — REGADENOSON 0.4 MG/5ML IV SOLN
0.4000 mg | Freq: Once | INTRAVENOUS | Status: AC
  Administered 2013-05-06: 0.4 mg via INTRAVENOUS

## 2013-05-06 MED ORDER — TECHNETIUM TC 99M SESTAMIBI GENERIC - CARDIOLITE
30.0000 | Freq: Once | INTRAVENOUS | Status: AC | PRN
  Administered 2013-05-06: 30 via INTRAVENOUS

## 2013-05-06 MED ORDER — TECHNETIUM TC 99M SESTAMIBI GENERIC - CARDIOLITE
10.0000 | Freq: Once | INTRAVENOUS | Status: AC | PRN
  Administered 2013-05-06: 10 via INTRAVENOUS

## 2013-05-06 NOTE — Telephone Encounter (Signed)
New message ° ° ° ° ° °Returning a nurses call °

## 2013-05-06 NOTE — Progress Notes (Signed)
  [facility] [address] [place], [place] [address] [phone]    Cardiology Nuclear Med Study  Nathan Kemp is a 73 y.o. male     MRN : [id number]     DOB: 1940/12/07  Procedure Date: 05/06/2013  Nuclear Med Background Indication for Stress Test:  Evaluation for Ischemia and Stent Patency History:  Asthma and '06 STENT/PTCA RCA 05/03/2013 ECHO Cardiac Risk Factors: Hypertension, Lipids and IDDM  Symptoms:  Dizziness, DOE and Palpitations   Nuclear Pre-Procedure Caffeine/Decaff Intake:  None NPO After: 7:00pm   Lungs:  clear O2 Sat: 96% on room air. IV 0.9% NS with Angio Cath:  20g  IV Site: R Hand  IV Started by:  Matilde Haymaker, RN  Chest Size (in):  46 Cup Size: n/a  Height: 5\' 9"  (1.753 m)  Weight:  220 lb (99.791 kg)  BMI:  Body mass index is 32.47 kg/(m^2). Tech Comments:  CBG 133,no Insulin this am    Nuclear Med Study 1 or 2 day study: 1 day  Stress Test Type:  Lexiscan  Reading MD: n/a  Order Authorizing Provider:  Natale Lay  Resting Radionuclide: Technetium 10m Sestamibi  Resting Radionuclide Dose: 11.0 mCi   Stress Radionuclide:  Technetium 1m Sestamibi  Stress Radionuclide Dose: 33.0 mCi           Stress Protocol Rest HR: 75 Stress HR: 103  Rest BP: 139/86 Stress BP: 162/93  Exercise Time (min): n/a METS: n/a   Predicted Max HR: 149 bpm % Max HR: 69.13 bpm Rate Pressure Product: 16686   Dose of Adenosine (mg):  n/a Dose of Lexiscan: 0.4 mg  Dose of Atropine (mg): n/a Dose of Dobutamine: n/a mcg/kg/min (at max HR)  Stress Test Technologist: Perrin Maltese, EMT-P  Nuclear Technologist:  Annye Rusk, CNMT     Rest Procedure:  Myocardial perfusion imaging was performed at rest 45 minutes following the intravenous administration of Technetium 41m Sestamibi. Rest ECG: NSR - Normal EKG  Stress Procedure:  The patient received IV Lexiscan 0.4 mg over 15-seconds.  Technetium 7m Sestamibi injected at  30-seconds. This patient was sob and lt. Headed with the Lexiscan injection. Quantitative spect images were obtained after a 45 minute delay. Stress ECG: NSR PAC;s and PVC;s   QPS Raw Data Images:  Normal; no motion artifact; normal heart/lung ratio. Stress Images:  Normal homogeneous uptake in all areas of the myocardium. Rest Images:  Normal homogeneous uptake in all areas of the myocardium. Subtraction (SDS):  Normal Transient Ischemic Dilatation (Normal <1.22):  0.88 Lung/Heart Ratio (Normal <0.45):  0.41  Quantitative Gated Spect Images QGS EDV:  n/a ml QGS ESV:  N/a ml Impression Exercise Capacity:  Lexiscan with low level exercise. BP Response:  Normal blood pressure response. Clinical Symptoms:  There is dyspnea. ECG Impression:  No significant ST segment change suggestive of ischemia. Comparison with Prior Nuclear Study: No images to compare  Overall Impression:  Normal stress nuclear study.  LV Ejection Fraction: study not gated.  LV Wall Motion:  No gating due to PAC;s and PVC;s   [employer]

## 2013-05-06 NOTE — Telephone Encounter (Signed)
Pt was given results of echo  °

## 2013-05-10 DIAGNOSIS — Z6833 Body mass index (BMI) 33.0-33.9, adult: Secondary | ICD-10-CM | POA: Diagnosis not present

## 2013-05-10 DIAGNOSIS — L089 Local infection of the skin and subcutaneous tissue, unspecified: Secondary | ICD-10-CM | POA: Diagnosis not present

## 2013-05-10 DIAGNOSIS — I10 Essential (primary) hypertension: Secondary | ICD-10-CM | POA: Diagnosis not present

## 2013-05-10 DIAGNOSIS — E119 Type 2 diabetes mellitus without complications: Secondary | ICD-10-CM | POA: Diagnosis not present

## 2013-06-17 DIAGNOSIS — M19049 Primary osteoarthritis, unspecified hand: Secondary | ICD-10-CM | POA: Diagnosis not present

## 2013-06-17 DIAGNOSIS — M542 Cervicalgia: Secondary | ICD-10-CM | POA: Diagnosis not present

## 2013-06-24 ENCOUNTER — Encounter: Payer: Self-pay | Admitting: Cardiovascular Disease

## 2013-06-24 ENCOUNTER — Ambulatory Visit (INDEPENDENT_AMBULATORY_CARE_PROVIDER_SITE_OTHER): Payer: Medicare Other | Admitting: Cardiovascular Disease

## 2013-06-24 VITALS — BP 150/86 | HR 70 | Ht 69.0 in | Wt 218.8 lb

## 2013-06-24 DIAGNOSIS — I2510 Atherosclerotic heart disease of native coronary artery without angina pectoris: Secondary | ICD-10-CM

## 2013-06-24 NOTE — Progress Notes (Signed)
Nathan Kemp Date of Birth  [birth year] [facility] [address]    [place], [place]  [address] [phone]  Fax  [phone]   Problem List 1. CAD - PCI of RCA 2006.  He had a 3.5 x 24 mm Liberte stent placed. We used a 4.5 mm noncompliant balloon for post dilatation. 2. Polymyalgia rheumatica 3. History of thrombocytopenia - ? Hx of Non-hodgkins lymphma 4. Irritable bowel syndrome 5. Hypothyroidism 6. Hypertension 7. Diabetes mellitus  History of Present Illness:  73 year old gentleman with a history of coronary artery disease. He has a history of PTCA and stenting of his right coronary artery in 2006.  He had a 3.5 x 24 mm Liberte stent placed. We used a 4.5 mm noncompliant balloon for post dilatation.   He's done well from a cardiac standpoint since that time.  He's not had any episodes of chest pain or shortness breath.  I saw him last year for anorexia and weight loss. He also had some thrombocytopenia. He's had a very extensive workup at [facility] and at [facility]. So far they've not found any clear-cut etiology for his low platelet counts and liver function abnormalities.  He has generally improved since that time. The plan now is to continue to observe but without any further testing until he develops any other symptoms.  Jan. 12, 2015:  Nathan Kemp was last seen in Sept. 2012: Nathan Kemp has been having some DOE - climbing stairs.   Has been present 4-5 weeks.  Also c/o fatigue, lack of energy.    Also has occasional episodes of dizziness.   He thinks the symptoms are similar to his angina prior to his stenting.  He is active ( [employer]).  No CP, no syncope.    Also has been diagnosed with PMR.     He had an episode of Thrombocytopenia and required platelet transfusion.  He has been on prednisone since that time.    June 24, 2013:  Feeling better.   Walking regularly - up to 22 minutes a day. No angina Get some chest pain earlier this  would appear to stress Myoview study was negative for ischemia.   Current Outpatient Prescriptions on File Prior to Visit  Medication Sig Dispense Refill  . acetaminophen (TYLENOL ARTHRITIS PAIN) 650 MG CR tablet Take 650 mg by mouth 3 (three) times daily.       . cholestyramine (QUESTRAN) 4 G packet 4 g daily.       Marland Kitchen dicyclomine (BENTYL) 20 MG tablet       . diphenhydrAMINE (BENADRYL) 25 MG tablet Take 50 mg by mouth at bedtime.       . insulin regular (NOVOLIN R,HUMULIN R) 100 units/mL injection Inject 6 Units into the skin 3 (three) times daily as needed. For glucose greater than 175      . levothyroxine (SYNTHROID, LEVOTHROID) 100 MCG tablet Take 100 mcg by mouth daily.      Marland Kitchen NITROSTAT 0.4 MG SL tablet PLACE 1 TABLET UNDER THE TONGUE EVERY 5 MINUTES AS NEEDED FOR CHEST PAIN  25 tablet  0  . olmesartan-hydrochlorothiazide (BENICAR HCT) 40-25 MG per tablet Take 0.5 tablets by mouth every morning.       [name] Glycol-Propyl Glycol (SYSTANE ULTRA OP) Apply 1-2 drops to eye as needed.      . predniSONE (STERAPRED UNI-PAK) 5 MG TABS tablet Take 5 mg by mouth daily.      Marland Kitchen zolpidem (AMBIEN)  10 MG tablet Take 10 mg by mouth at bedtime.        No current facility-administered medications on file prior to visit.    Allergies  Allergen Reactions  . Cefdinir Hives  . Adhesive [Tape] Other (See Comments)    blisters  . Lortab [Hydrocodone-Acetaminophen] Other (See Comments)    Makes patient very loopy. Update   Able to take now  . Niaspan [Niacin] Hives  . Clindamycin Nausea Only and Other (See Comments)    Stomach cramps  . Codeine Rash    Had rash previously but not recently-taken in cough medicine and no problems but patient can tolerate    Past Medical History  Diagnosis Date  . Anorexia   . Thrombocytopenia   . Fatigue   . Hypertension   . Hyperlipidemia   . Vitamin D deficiency   . Obesity   . DDD (degenerative disc disease), cervical   . Thyroid disease   . Trouble  swallowing   . Cough   . Leg swelling     due to lymphedema  . Hypothyroidism   . Diabetes mellitus     borderline-controlled by diet  . Acute GI bleeding 04/16/2011  . Coronary artery disease     stent 06  . Heart murmur   . Asthma     when  have congstion  of chest  . Pneumonia   . Anemia   . Blood dyscrasia 06/2009    thrombocytopenia  . Blood transfusion     platelets 12/12  . GERD (gastroesophageal reflux disease)   . 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 ED (erectile dysfunction)     Past Surgical History  Procedure Laterality Date  . Cardiac catheterization  01/31/2005    EF 60-65%  . Coronary angioplasty with stent placement  2006    STENTING OF HIS CORONARY ARTERY  . Appendectomy  1967  . US echocardiography  02/28/2005    EF 55-60%  . Tonsillectomy and adenoidectomy    . Nasal sinus surgery  2005  . Edg dilitation  [id number]  . Lymph node removal  2011    right groin  . Bone marrow biopsy      2011-at [facility], 01/2010-at [place], [facility]  . Lymph node biopsy  03/15/2011    Procedure: LYMPH NODE BIOPSY;  Surgeon: Earnstine Regal, MD;  Location: [facility];  Service: General;  Laterality: Right;  Right Anterior Cervical Lymph Node Excisional Biopsy   . 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 Never Smoker   Smokeless tobacco  . Never Used    History  Alcohol Use No    Family History  Problem Relation Age of Onset  . Heart failure Father   . Hypertension Father   . Hearing loss Father 71    congestive heart failure  . Diabetes Father   . Hypertension Mother   . Stroke Mother 75    Reviw of Systems:  Reviewed in the HPI.  All other systems are negative.  Physical Exam: BP 150/86  Pulse 70  Ht _0  (1.753 m)  Wt 218 lb 12.8 oz (99.247 kg)  BMI 32.30 kg/m2 The patient is alert and oriented x 3.  The mood and affect are normal.   Skin: warm and dry.   Color is normal.    HEENT:  the sclera are nonicteric.  The mucous membranes are moist.  The carotids are 2+ without bruits.  There is no thyromegaly.  There is no JVD.   Lungs: clear.  The chest wall is non tender.   Heart: regular rate with a normal S1 and S2.   Occasional premature beatsThere is a soft systolic murmur. The PMI is not displaced.    Abdomen: good bowel sounds.  There is no guarding or rebound.  There is no hepatosplenomegaly or tenderness.  There are no masses.  Extremities:  There  Is trace edema and chronic stasis changes on left side.  The right lower leg has 1+.   He has some chronic stasis changes.  The distal pulses are intact.  Neuro:  Cranial nerves II - XII are intact.  Motor and sensory functions are intact.   The gait is normal.  ECG: Assessment / Plan:

## 2013-06-24 NOTE — Patient Instructions (Signed)
Your physician wants you to follow-up in: 6 months  You will receive a reminder letter in the mail two months in advance. If you don't receive a letter, please call our office to schedule the follow-up appointment.  Your physician recommends that you continue on your current medications as directed. Please refer to the Current Medication list given to you today.  

## 2013-06-24 NOTE — Assessment & Plan Note (Signed)
Nathan Kemp seems to be doing well. He's not having episodes of angina. We'll continue with the same medications. I am Pleased has not had any further episodes of chest pain. His Myoview study was normal.  He had his lipids drawn at Dr. Jacquiline Doe office last month. We will get him to fax these over to [place]. I seen again in 6 months. We'll check fasting lipids, liver enzymes, and basic metabolic profile at that time.

## 2013-06-25 DIAGNOSIS — H04129 Dry eye syndrome of unspecified lacrimal gland: Secondary | ICD-10-CM | POA: Diagnosis not present

## 2013-06-25 DIAGNOSIS — H15009 Unspecified scleritis, unspecified eye: Secondary | ICD-10-CM | POA: Diagnosis not present

## 2013-06-30 ENCOUNTER — Encounter (HOSPITAL_BASED_OUTPATIENT_CLINIC_OR_DEPARTMENT_OTHER): Payer: Self-pay | Admitting: Emergency Medicine

## 2013-06-30 ENCOUNTER — Emergency Department (HOSPITAL_BASED_OUTPATIENT_CLINIC_OR_DEPARTMENT_OTHER)
Admission: EM | Admit: 2013-06-30 | Discharge: 2013-06-30 | Disposition: A | Discharge status: Home / Self Care | Payer: Medicare Other | Attending: Emergency Medicine | Admitting: Emergency Medicine

## 2013-06-30 DIAGNOSIS — Z79899 Other long term (current) drug therapy: Secondary | ICD-10-CM | POA: Insufficient documentation

## 2013-06-30 DIAGNOSIS — E669 Obesity, unspecified: Secondary | ICD-10-CM | POA: Insufficient documentation

## 2013-06-30 DIAGNOSIS — Z794 Long term (current) use of insulin: Secondary | ICD-10-CM | POA: Diagnosis not present

## 2013-06-30 DIAGNOSIS — Z9861 Coronary angioplasty status: Secondary | ICD-10-CM | POA: Diagnosis not present

## 2013-06-30 DIAGNOSIS — I2510 Atherosclerotic heart disease of native coronary artery without angina pectoris: Secondary | ICD-10-CM | POA: Diagnosis not present

## 2013-06-30 DIAGNOSIS — E785 Hyperlipidemia, unspecified: Secondary | ICD-10-CM | POA: Insufficient documentation

## 2013-06-30 DIAGNOSIS — Z8701 Personal history of pneumonia (recurrent): Secondary | ICD-10-CM | POA: Diagnosis not present

## 2013-06-30 DIAGNOSIS — Z9889 Other specified postprocedural states: Secondary | ICD-10-CM | POA: Insufficient documentation

## 2013-06-30 DIAGNOSIS — E039 Hypothyroidism, unspecified: Secondary | ICD-10-CM | POA: Diagnosis not present

## 2013-06-30 DIAGNOSIS — Z862 Personal history of diseases of the blood and blood-forming organs and certain disorders involving the immune mechanism: Secondary | ICD-10-CM | POA: Diagnosis not present

## 2013-06-30 DIAGNOSIS — E119 Type 2 diabetes mellitus without complications: Secondary | ICD-10-CM | POA: Insufficient documentation

## 2013-06-30 DIAGNOSIS — R011 Cardiac murmur, unspecified: Secondary | ICD-10-CM | POA: Diagnosis not present

## 2013-06-30 DIAGNOSIS — Z8719 Personal history of other diseases of the digestive system: Secondary | ICD-10-CM | POA: Insufficient documentation

## 2013-06-30 DIAGNOSIS — J45909 Unspecified asthma, uncomplicated: Secondary | ICD-10-CM | POA: Diagnosis not present

## 2013-06-30 DIAGNOSIS — S81812A Laceration without foreign body, left lower leg, initial encounter: Secondary | ICD-10-CM

## 2013-06-30 DIAGNOSIS — Z8739 Personal history of other diseases of the musculoskeletal system and connective tissue: Secondary | ICD-10-CM | POA: Insufficient documentation

## 2013-06-30 DIAGNOSIS — IMO0002 Reserved for concepts with insufficient information to code with codable children: Secondary | ICD-10-CM | POA: Insufficient documentation

## 2013-06-30 DIAGNOSIS — Y9389 Activity, other specified: Secondary | ICD-10-CM | POA: Insufficient documentation

## 2013-06-30 DIAGNOSIS — Z8639 Personal history of other endocrine, nutritional and metabolic disease: Secondary | ICD-10-CM | POA: Insufficient documentation

## 2013-06-30 DIAGNOSIS — S81009A Unspecified open wound, unspecified knee, initial encounter: Secondary | ICD-10-CM | POA: Insufficient documentation

## 2013-06-30 DIAGNOSIS — I10 Essential (primary) hypertension: Secondary | ICD-10-CM | POA: Insufficient documentation

## 2013-06-30 DIAGNOSIS — Y929 Unspecified place or not applicable: Secondary | ICD-10-CM | POA: Insufficient documentation

## 2013-06-30 DIAGNOSIS — Z85828 Personal history of other malignant neoplasm of skin: Secondary | ICD-10-CM | POA: Diagnosis not present

## 2013-06-30 DIAGNOSIS — S81809A Unspecified open wound, unspecified lower leg, initial encounter: Principal | ICD-10-CM

## 2013-06-30 DIAGNOSIS — S91009A Unspecified open wound, unspecified ankle, initial encounter: Principal | ICD-10-CM

## 2013-06-30 HISTORY — DX: Irritable bowel syndrome, unspecified: K58.9

## 2013-06-30 HISTORY — DX: Polymyalgia rheumatica: M35.3

## 2013-06-30 NOTE — Discharge Instructions (Signed)
Laceration Care, Adult °A laceration is a cut that goes through all layers of the skin. The cut goes into the tissue beneath the skin. °HOME CARE °For stitches (sutures) or staples: °· Keep the cut clean and dry. °· If you have a bandage (dressing), change it at least once a day. Change the bandage if it gets wet or dirty, or as told by your doctor. °· Wash the cut with soap and water 2 times a day. Rinse the cut with water. Pat it dry with a clean towel. °· Put a thin layer of medicated cream on the cut as told by your doctor. °· You may shower after the first 24 hours. Do not soak the cut in water until the stitches are removed. °· Only take medicines as told by your doctor. °· Have your stitches or staples removed as told by your doctor. °For skin adhesive strips: °· Keep the cut clean and dry. °· Do not get the strips wet. You may take a bath, but be careful to keep the cut dry. °· If the cut gets wet, pat it dry with a clean towel. °· The strips will fall off on their own. Do not remove the strips that are still stuck to the cut. °For wound glue: °· You may shower or take baths. Do not soak or scrub the cut. Do not swim. Avoid heavy sweating until the glue falls off on its own. After a shower or bath, pat the cut dry with a clean towel. °· Do not put medicine on your cut until the glue falls off. °· If you have a bandage, do not put tape over the glue. °· Avoid lots of sunlight or tanning lamps until the glue falls off. Put sunscreen on the cut for the first year to reduce your scar. °· The glue will fall off on its own. Do not pick at the glue. °You may need a tetanus shot if: °· You cannot remember when you had your last tetanus shot. °· You have never had a tetanus shot. °If you need a tetanus shot and you choose not to have one, you may get tetanus. Sickness from tetanus can be serious. °GET HELP RIGHT AWAY IF:  °· Your pain does not get better with medicine. °· Your arm, hand, leg, or foot loses feeling  (numbness) or changes color. °· Your cut is bleeding. °· Your joint feels weak, or you cannot use your joint. °· You have painful lumps on your body. °· Your cut is red, puffy (swollen), or painful. °· You have a red line on the skin near the cut. °· You have yellowish-white fluid (pus) coming from the cut. °· You have a fever. °· You have a bad smell coming from the cut or bandage. °· Your cut breaks open before or after stitches are removed. °· You notice something coming out of the cut, such as wood or glass. °· You cannot move a finger or toe. °MAKE SURE YOU:  °· Understand these instructions. °· Will watch your condition. °· Will get help right away if you are not doing well or get worse. °Document Released: 09/21/2007 Document Revised: 06/27/2011 Document Reviewed: 09/28/2010 °[employer]® Patient Information ©2014 [employer]. ° ° ° ° °

## 2013-06-30 NOTE — ED Provider Notes (Signed)
CSN: [id number]     Arrival date & time 06/30/13  1727 History   First MD Initiated Contact with Patient 06/30/13 1758     This chart was scribed for Nathan Shipper, MD by Nathan Kemp, ED Scribe. This patient was seen in room MH06/MH06 and the patient's care was started 6:00 PM.   Chief Complaint  Patient presents with  . Extremity Laceration   Patient is a 73 y.o. male presenting with skin laceration. The history is provided by the patient. No language interpreter was used.  Laceration Location:  Leg Leg laceration location:  L lower leg Length (cm):  5 cm Bleeding: controlled   Laceration mechanism:  Blunt object Pain details:    Severity:  Moderate   Progression:  Unchanged Foreign body present:  No foreign bodies Worsened by:  Nothing tried Ineffective treatments:  None tried Tetanus status:  Up to date   HPI Comments: Nathan Kemp is a 73 y.o. male who presents to the Emergency Department complaining of a laceration to left leg sustained a few hours prior to arrival. Pt states he hit his leg on a concrete flower planter resulting in a laceration. Wife denies cleansing the are prior to arrival.  Tetanus UTD. Pt currently on prednisone. His PMHx includes HTN, hyperlipidemia, thrombocytopenia, DDD, thyroid disease, hypothyroidism, DM, IBS, GERD, asthma, and heart murmur. No other concerns at this time.  Past Medical History  Diagnosis Date  . Anorexia   . Thrombocytopenia   . Fatigue   . Hypertension   . Hyperlipidemia   . Vitamin D deficiency   . Obesity   . DDD (degenerative disc disease), cervical   . Thyroid disease   . Trouble swallowing   . Cough   . Leg swelling     due to lymphedema  . Hypothyroidism   . Diabetes mellitus     borderline-controlled by diet  . Acute GI bleeding 04/16/2011  . Coronary artery disease     stent 06  . Heart murmur   . Asthma     when  have congstion  of chest  . Pneumonia   . Anemia   . Blood dyscrasia 06/2009   thrombocytopenia  . Blood transfusion     platelets 12/12  . GERD (gastroesophageal reflux disease)   . 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 ED (erectile dysfunction)   . Polymyalgia   . IBS (irritable bowel syndrome)    Past Surgical History  Procedure Laterality Date  . Cardiac catheterization  01/31/2005    EF 60-65%  . Coronary angioplasty with stent placement  2006    STENTING OF HIS CORONARY ARTERY  . Appendectomy  1967  . US echocardiography  02/28/2005    EF 55-60%  . Tonsillectomy and adenoidectomy    . Nasal sinus surgery  2005  . Edg dilitation  [id number]  . Lymph node removal  2011    right groin  . Bone marrow biopsy      2011-at [facility], 01/2010-[facility], [facility]  . Lymph node biopsy  03/15/2011    Procedure: LYMPH NODE BIOPSY;  Surgeon: Earnstine Regal, MD;  Location: [facility];  Service: General;  Laterality: Right;  Right Anterior Cervical Lymph Node Excisional Biopsy   . 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 Cataract Bilateral    Family History  Problem Relation Age of Onset  .  Heart failure Father   . Hypertension Father   . Hearing loss Father 46    congestive heart failure  . Diabetes Father   . Hypertension Mother   . Stroke Mother 61   History  Substance Use Topics  . Smoking status: Never Smoker   . Smokeless tobacco: Never Used  . Alcohol Use: No    Review of Systems  Constitutional: Negative for fever and chills.  HENT: Negative for congestion.   Eyes: Negative for redness.  Respiratory: Negative for cough.   Skin: Positive for wound.  Psychiatric/Behavioral: Negative for confusion.  All other systems reviewed and are negative.      Allergies  Cefdinir; Adhesive; Lortab; Niaspan; Clindamycin; and Codeine  Home Medications   Current Outpatient Rx  Name  Route  Sig  Dispense  Refill  . acetaminophen (TYLENOL ARTHRITIS PAIN) 650 MG CR tablet   Oral   Take 650 mg  by mouth 3 (three) times daily.          . cholestyramine (QUESTRAN) 4 G packet      4 g daily.          Marland Kitchen dicyclomine (BENTYL) 20 MG tablet               . diphenhydrAMINE (BENADRYL) 25 MG tablet   Oral   Take 50 mg by mouth at bedtime.          . insulin regular (NOVOLIN R,HUMULIN R) 100 units/mL injection   Subcutaneous   Inject 6 Units into the skin 3 (three) times daily as needed. For glucose greater than 175         . levothyroxine (SYNTHROID, LEVOTHROID) 100 MCG tablet   Oral   Take 100 mcg by mouth daily.         Marland Kitchen NITROSTAT 0.4 MG SL tablet      PLACE 1 TABLET UNDER THE TONGUE EVERY 5 MINUTES AS NEEDED FOR CHEST PAIN   25 tablet   0     No further refills without appointment   . olmesartan-hydrochlorothiazide (BENICAR HCT) 40-25 MG per tablet   Oral   Take 0.5 tablets by mouth every morning.          [name] Glycol-Propyl Glycol (SYSTANE ULTRA OP)   Ophthalmic   Apply 1-2 drops to eye as needed.         . predniSONE (STERAPRED UNI-PAK) 5 MG TABS tablet   Oral   Take 5 mg by mouth daily.         Marland Kitchen zolpidem (AMBIEN) 10 MG tablet   Oral   Take 10 mg by mouth at bedtime.           Triage Vitals: BP 111/62  Pulse 95  Temp(Src) 98.8 F (37.1 C) (Oral)  Resp 20  Ht $R'5\' 8"'Xh$  (1.727 m)  Wt 215 lb (97.523 kg)  BMI 32.70 kg/m2  SpO2 95%   Physical Exam  Nursing note and vitals reviewed. Constitutional: He is oriented to person, place, and time. He appears well-developed and well-nourished.  HENT:  Head: Normocephalic and atraumatic.  Eyes: EOM are normal.  Neck: Normal range of motion.  Cardiovascular: Normal rate, regular rhythm, normal heart sounds and intact distal pulses.   Pulmonary/Chest: Effort normal and breath sounds normal. No respiratory distress. He has no wheezes.  Abdominal: Soft. He exhibits no distension. There is no tenderness.  Musculoskeletal: Normal range of motion.  Left knee and ankle normal  Neurological:  He is alert  and oriented to person, place, and time.  Skin: Skin is warm and dry.  5 cm J shaped skin tare to mid posterior lateral left calf  Psychiatric: He has a normal mood and affect. Judgment normal.    ED Course  LACERATION REPAIR Date/Time: 07/01/2013 12:08 AM Performed by: Nathan Kemp Authorized by: Nathan Kemp Consent: Verbal consent obtained. Body area: lower extremity Location details: left lower leg Laceration length: 5 cm Foreign bodies: no foreign bodies Tendon involvement: none Nerve involvement: none Vascular damage: no Patient sedated: no Preparation: Patient was prepped and draped in the usual sterile fashion. Irrigation solution: saline Irrigation method: syringe Debridement: none Degree of undermining: none Skin closure: glue Technique: simple Approximation: close Approximation difficulty: simple Dressing: 4x4 sterile gauze Patient tolerance: Patient tolerated the procedure well with no immediate complications.   (including critical care time)  DIAGNOSTIC STUDIES: Oxygen Saturation is 95% on RA, adequate by my interpretation.    COORDINATION OF CARE: 6:00 PM-Discussed treatment plan with pt at bedside and pt agreed to plan.     Labs Review Labs Reviewed - No data to display Imaging Review No results found.   EKG Interpretation None      MDM   Final diagnoses:  Noninfected skin tear of left leg    57M here with skin tear to calf, not amenable to sutures. Tetanus UTD. Dermabond used for closure as above. Discharged.  I personally performed the services described in this documentation, which was scribed in my presence. The recorded information has been reviewed and is accurate.     Nathan Shipper, MD 07/01/13 [phone]

## 2013-06-30 NOTE — ED Notes (Signed)
Pt reports he hit his left leg on concrete flower planter and has skin tear ti anterior shin- coban applied pta and not removed in triage- no bleeding noted through dressing-

## 2013-07-01 DIAGNOSIS — H04129 Dry eye syndrome of unspecified lacrimal gland: Secondary | ICD-10-CM | POA: Diagnosis not present

## 2013-07-09 DIAGNOSIS — S81809A Unspecified open wound, unspecified lower leg, initial encounter: Secondary | ICD-10-CM | POA: Diagnosis not present

## 2013-07-09 DIAGNOSIS — S81009A Unspecified open wound, unspecified knee, initial encounter: Secondary | ICD-10-CM | POA: Diagnosis not present

## 2013-07-09 DIAGNOSIS — Z6832 Body mass index (BMI) 32.0-32.9, adult: Secondary | ICD-10-CM | POA: Diagnosis not present

## 2013-07-26 ENCOUNTER — Telehealth: Payer: Self-pay | Admitting: Oncology

## 2013-07-26 NOTE — Telephone Encounter (Signed)
returned pt call and confirmed appt.....pt ok and  aware °

## 2013-08-01 DIAGNOSIS — L089 Local infection of the skin and subcutaneous tissue, unspecified: Secondary | ICD-10-CM | POA: Diagnosis not present

## 2013-08-01 DIAGNOSIS — E119 Type 2 diabetes mellitus without complications: Secondary | ICD-10-CM | POA: Diagnosis not present

## 2013-08-01 DIAGNOSIS — R609 Edema, unspecified: Secondary | ICD-10-CM | POA: Diagnosis not present

## 2013-08-01 DIAGNOSIS — S81009A Unspecified open wound, unspecified knee, initial encounter: Secondary | ICD-10-CM | POA: Diagnosis not present

## 2013-08-01 DIAGNOSIS — Z6832 Body mass index (BMI) 32.0-32.9, adult: Secondary | ICD-10-CM | POA: Diagnosis not present

## 2013-08-01 DIAGNOSIS — I10 Essential (primary) hypertension: Secondary | ICD-10-CM | POA: Diagnosis not present

## 2013-08-01 DIAGNOSIS — M353 Polymyalgia rheumatica: Secondary | ICD-10-CM | POA: Diagnosis not present

## 2013-08-01 DIAGNOSIS — S81809A Unspecified open wound, unspecified lower leg, initial encounter: Secondary | ICD-10-CM | POA: Diagnosis not present

## 2013-08-02 ENCOUNTER — Ambulatory Visit (HOSPITAL_BASED_OUTPATIENT_CLINIC_OR_DEPARTMENT_OTHER): Payer: Medicare Other | Admitting: Oncology

## 2013-08-02 ENCOUNTER — Other Ambulatory Visit (HOSPITAL_BASED_OUTPATIENT_CLINIC_OR_DEPARTMENT_OTHER): Payer: Medicare Other

## 2013-08-02 ENCOUNTER — Telehealth: Payer: Self-pay | Admitting: Oncology

## 2013-08-02 VITALS — BP 140/79 | HR 75 | Temp 98.4°F | Resp 19 | Ht 68.0 in | Wt 219.5 lb

## 2013-08-02 DIAGNOSIS — D693 Immune thrombocytopenic purpura: Secondary | ICD-10-CM

## 2013-08-02 DIAGNOSIS — M353 Polymyalgia rheumatica: Secondary | ICD-10-CM | POA: Diagnosis not present

## 2013-08-02 DIAGNOSIS — D696 Thrombocytopenia, unspecified: Secondary | ICD-10-CM

## 2013-08-02 DIAGNOSIS — E039 Hypothyroidism, unspecified: Secondary | ICD-10-CM

## 2013-08-02 DIAGNOSIS — R5381 Other malaise: Secondary | ICD-10-CM

## 2013-08-02 LAB — CBC WITH DIFFERENTIAL/PLATELET
BASO%: 0.3 % (ref 0.0–2.0)
BASOS ABS: 0 10*3/uL (ref 0.0–0.1)
EOS%: 0.8 % (ref 0.0–7.0)
Eosinophils Absolute: 0.1 10*3/uL (ref 0.0–0.5)
HEMATOCRIT: 41.8 % (ref 38.4–49.9)
HEMOGLOBIN: 13.8 g/dL (ref 13.0–17.1)
LYMPH%: 13.6 % — ABNORMAL LOW (ref 14.0–49.0)
MCH: 31.6 pg (ref 27.2–33.4)
MCHC: 33.1 g/dL (ref 32.0–36.0)
MCV: 95.5 fL (ref 79.3–98.0)
MONO#: 2.6 10*3/uL — ABNORMAL HIGH (ref 0.1–0.9)
MONO%: 30.9 % — AB (ref 0.0–14.0)
NEUT#: 4.5 10*3/uL (ref 1.5–6.5)
NEUT%: 54.4 % (ref 39.0–75.0)
Platelets: 139 10*3/uL — ABNORMAL LOW (ref 140–400)
RBC: 4.37 10*6/uL (ref 4.20–5.82)
RDW: 14.8 % — ABNORMAL HIGH (ref 11.0–14.6)
WBC: 8.3 10*3/uL (ref 4.0–10.3)
lymph#: 1.1 10*3/uL (ref 0.9–3.3)

## 2013-08-02 LAB — TECHNOLOGIST REVIEW

## 2013-08-02 NOTE — Telephone Encounter (Signed)
Gave pt appt for November 2015 per his request MD and Lab

## 2013-08-02 NOTE — Progress Notes (Signed)
  [facility] OFFICE PROGRESS NOTE   Diagnosis: Thrombocytopenia  INTERVAL HISTORY:   Nathan Kemp returns as scheduled. He feels well. He continues a slow prednisone taper for treatment of PMR. He has chronic swelling in the right greater than left lower leg. He bruises easily. He suffered recent operations at the left and right lower leg. A left leg ulceration is healing. There is a new ulceration at the right lower leg with a dressing in place. This is followed by Dr. Reynaldo Minium. No fevers or night sweats. Good appetite. No palpable lymph nodes.  Objective:  Vital signs in last 24 hours:  Blood pressure 140/79, pulse 75, temperature 98.4 F (36.9 C), temperature source Oral, resp. rate 19, height $RemoveBe'5\' 8"'CLkveoewj$  (1.727 m), weight 219 lb 8 oz (99.565 kg).    HEENT: No thrush  Lymphatics: No cervical, supraclavicular, axillary, or inguinal nodes. Prominent right greater than left axillary fat pad Resp: Lungs clear bilaterally Cardio: Regular rate and rhythm GI: No hepatosplenic Vascular: Pitting edema at the right greater than left lower leg with chronic stasis change.  Skin: Healing ulcer at the left calf with an eschar, bandage in place over the left calf    Lab Results:  Lab Results  Component Value Date   WBC 8.3 08/02/2013   HGB 13.8 08/02/2013   HCT 41.8 08/02/2013   MCV 95.5 08/02/2013   PLT 139* 08/02/2013   NEUTROABS 4.5 08/02/2013    Medications: I have reviewed the patient's current medications.  Assessment/Plan: 1.History of splenomegaly - the spleen has been palpable on intermittent office visits in the past. Splenomegaly was confirmed on an abdominal ultrasound 06/14/2010.  2.history of Elevated liver enzymes.  3.History of Pancytopenia.  4.History of a Systemic illness characterized by intermittent fever, weight loss, and malaise-improved.  5.History of coronary artery disease.  6.Bone marrow biopsy 11/27/2009 at [facility] and on  12/19/2009 at [facility] confirmed a hypercellular marrow with mild reticulin fibrosis and no evidence of malignancy.  7.Liver biopsy 02/10/2010 revealed minimal portal and lobular inflammation with evidence of steatosis.  8.Excisional biopsy of a right inguinal lymph node on 02/19/2010 at [facility] with the pathology revealing no evidence of a malignancy.  9.Hypothyroidism - maintained on thyroid hormone replacement.  10.Erectile dysfunction - the testosterone level was low 05/13/2010.  11.Hepatomegaly on the abdominal ultrasound 06/14/2010.  12.Skin hyperpigmentation over the extremities.  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  16.T11 compression fracture. Status post a kyphoplasty procedure Dr. Sherwood Gambler on 07/27/2011. The back pain has improved  17. PMR-maintained on prednisone and followed by Dr. Reynaldo Minium  18. [place] skin ulcerations-followed by Dr. Reynaldo Minium   Disposition:  Nathan Kemp is stable from a hematologic standpoint. He will return for an office visit and CBC in 6 months. There is no clinical evidence for progression of the non-Hodgkin's lymphoma.  He will discuss bone building therapy with Dr. Reynaldo Minium.  Ladell Pier, MD  08/02/2013  11:44 AM

## 2013-08-05 DIAGNOSIS — L089 Local infection of the skin and subcutaneous tissue, unspecified: Secondary | ICD-10-CM | POA: Diagnosis not present

## 2013-08-05 DIAGNOSIS — S91009A Unspecified open wound, unspecified ankle, initial encounter: Secondary | ICD-10-CM | POA: Diagnosis not present

## 2013-08-05 DIAGNOSIS — I10 Essential (primary) hypertension: Secondary | ICD-10-CM | POA: Diagnosis not present

## 2013-08-05 DIAGNOSIS — Z6832 Body mass index (BMI) 32.0-32.9, adult: Secondary | ICD-10-CM | POA: Diagnosis not present

## 2013-08-05 DIAGNOSIS — E119 Type 2 diabetes mellitus without complications: Secondary | ICD-10-CM | POA: Diagnosis not present

## 2013-08-05 DIAGNOSIS — S81009A Unspecified open wound, unspecified knee, initial encounter: Secondary | ICD-10-CM | POA: Diagnosis not present

## 2013-08-05 DIAGNOSIS — M353 Polymyalgia rheumatica: Secondary | ICD-10-CM | POA: Diagnosis not present

## 2013-08-09 DIAGNOSIS — Z6832 Body mass index (BMI) 32.0-32.9, adult: Secondary | ICD-10-CM | POA: Diagnosis not present

## 2013-08-09 DIAGNOSIS — S81809A Unspecified open wound, unspecified lower leg, initial encounter: Secondary | ICD-10-CM | POA: Diagnosis not present

## 2013-08-09 DIAGNOSIS — S81009A Unspecified open wound, unspecified knee, initial encounter: Secondary | ICD-10-CM | POA: Diagnosis not present

## 2013-08-09 DIAGNOSIS — R0982 Postnasal drip: Secondary | ICD-10-CM | POA: Diagnosis not present

## 2013-08-09 DIAGNOSIS — L089 Local infection of the skin and subcutaneous tissue, unspecified: Secondary | ICD-10-CM | POA: Diagnosis not present

## 2013-08-12 DIAGNOSIS — S81009A Unspecified open wound, unspecified knee, initial encounter: Secondary | ICD-10-CM | POA: Diagnosis not present

## 2013-08-12 DIAGNOSIS — L089 Local infection of the skin and subcutaneous tissue, unspecified: Secondary | ICD-10-CM | POA: Diagnosis not present

## 2013-08-14 ENCOUNTER — Encounter (HOSPITAL_BASED_OUTPATIENT_CLINIC_OR_DEPARTMENT_OTHER): Payer: Medicare Other | Attending: General Surgery

## 2013-08-14 DIAGNOSIS — L97809 Non-pressure chronic ulcer of other part of unspecified lower leg with unspecified severity: Secondary | ICD-10-CM | POA: Diagnosis not present

## 2013-08-14 DIAGNOSIS — E119 Type 2 diabetes mellitus without complications: Secondary | ICD-10-CM | POA: Diagnosis not present

## 2013-08-14 DIAGNOSIS — Z8571 Personal history of Hodgkin lymphoma: Secondary | ICD-10-CM | POA: Diagnosis not present

## 2013-08-14 DIAGNOSIS — I10 Essential (primary) hypertension: Secondary | ICD-10-CM | POA: Insufficient documentation

## 2013-08-15 NOTE — Progress Notes (Signed)
[facility]  NAME:  TAD, FANCHER NO.:  [id number]  MEDICAL RECORD NO.:  [id number]      DATE OF BIRTH:  05/07/1940  PHYSICIAN:  Judene Companion, M.D.           VISIT DATE:                                  OFFICE VISIT   [name] is a 74 year old gentleman who has a history of trauma to the anterior aspect of both of his legs, and he is left with hematoma and some abrasive marks on both of his legs.  The worst one is on the right leg where he has an ulcer about a centimeter or 2 in diameter.  He was found to have history of hypertension, history of some sort of lymphoma that has been treated and he apparently is cleared.  He was found to have a blood pressure 167/88, respirations 16, pulse 77, temperature 98.  He is 5 feet 9 inches, weighs 218 pounds.  He will be treated here at the wound clinic for these traumatic wounds on the anterior aspect of his leg.  I am going to treat him with collagen and we will wrap the right leg with Profore Lite.  I do not think that the left leg is needing compression as the right leg is quite swollen.  He will return in a week and we will reassess him at that time.  So, his diagnosis is traumatic wounds to both of his legs, history of hypertension, history of type 2 diabetes, history of Hodgkin lymphoma, history of back surgery for spinal stenosis.     Judene Companion, M.D.     PP/MEDQ  D:  08/14/2013  T:  08/15/2013  Job:  [id number]

## 2013-08-21 ENCOUNTER — Encounter (HOSPITAL_BASED_OUTPATIENT_CLINIC_OR_DEPARTMENT_OTHER): Payer: Medicare Other | Attending: General Surgery

## 2013-08-21 DIAGNOSIS — S8990XA Unspecified injury of unspecified lower leg, initial encounter: Secondary | ICD-10-CM | POA: Insufficient documentation

## 2013-08-21 DIAGNOSIS — S99919A Unspecified injury of unspecified ankle, initial encounter: Secondary | ICD-10-CM | POA: Diagnosis not present

## 2013-08-21 DIAGNOSIS — X58XXXA Exposure to other specified factors, initial encounter: Secondary | ICD-10-CM | POA: Insufficient documentation

## 2013-08-21 DIAGNOSIS — S99929A Unspecified injury of unspecified foot, initial encounter: Principal | ICD-10-CM

## 2013-08-26 DIAGNOSIS — M353 Polymyalgia rheumatica: Secondary | ICD-10-CM | POA: Diagnosis not present

## 2013-08-26 DIAGNOSIS — Z6833 Body mass index (BMI) 33.0-33.9, adult: Secondary | ICD-10-CM | POA: Diagnosis not present

## 2013-08-26 DIAGNOSIS — C8585 Other specified types of non-Hodgkin lymphoma, lymph nodes of inguinal region and lower limb: Secondary | ICD-10-CM | POA: Diagnosis not present

## 2013-08-26 DIAGNOSIS — E119 Type 2 diabetes mellitus without complications: Secondary | ICD-10-CM | POA: Diagnosis not present

## 2013-08-26 DIAGNOSIS — I10 Essential (primary) hypertension: Secondary | ICD-10-CM | POA: Diagnosis not present

## 2013-08-28 DIAGNOSIS — S8990XA Unspecified injury of unspecified lower leg, initial encounter: Secondary | ICD-10-CM | POA: Diagnosis not present

## 2013-08-28 DIAGNOSIS — S99929A Unspecified injury of unspecified foot, initial encounter: Secondary | ICD-10-CM | POA: Diagnosis not present

## 2013-09-02 DIAGNOSIS — H6120 Impacted cerumen, unspecified ear: Secondary | ICD-10-CM | POA: Diagnosis not present

## 2013-09-04 DIAGNOSIS — S8990XA Unspecified injury of unspecified lower leg, initial encounter: Secondary | ICD-10-CM | POA: Diagnosis not present

## 2013-09-04 DIAGNOSIS — S99919A Unspecified injury of unspecified ankle, initial encounter: Secondary | ICD-10-CM | POA: Diagnosis not present

## 2013-09-11 DIAGNOSIS — S99919A Unspecified injury of unspecified ankle, initial encounter: Secondary | ICD-10-CM | POA: Diagnosis not present

## 2013-09-11 DIAGNOSIS — S8990XA Unspecified injury of unspecified lower leg, initial encounter: Secondary | ICD-10-CM | POA: Diagnosis not present

## 2013-09-18 ENCOUNTER — Encounter (HOSPITAL_BASED_OUTPATIENT_CLINIC_OR_DEPARTMENT_OTHER): Payer: Medicare Other | Attending: General Surgery

## 2013-09-18 DIAGNOSIS — X58XXXA Exposure to other specified factors, initial encounter: Secondary | ICD-10-CM | POA: Insufficient documentation

## 2013-09-18 DIAGNOSIS — S99919A Unspecified injury of unspecified ankle, initial encounter: Principal | ICD-10-CM

## 2013-09-18 DIAGNOSIS — S99929A Unspecified injury of unspecified foot, initial encounter: Principal | ICD-10-CM

## 2013-09-18 DIAGNOSIS — S8990XA Unspecified injury of unspecified lower leg, initial encounter: Secondary | ICD-10-CM | POA: Diagnosis not present

## 2013-09-25 DIAGNOSIS — S99919A Unspecified injury of unspecified ankle, initial encounter: Secondary | ICD-10-CM | POA: Diagnosis not present

## 2013-09-25 DIAGNOSIS — S8990XA Unspecified injury of unspecified lower leg, initial encounter: Secondary | ICD-10-CM | POA: Diagnosis not present

## 2013-09-27 DIAGNOSIS — Z85828 Personal history of other malignant neoplasm of skin: Secondary | ICD-10-CM | POA: Diagnosis not present

## 2013-09-27 DIAGNOSIS — L570 Actinic keratosis: Secondary | ICD-10-CM | POA: Diagnosis not present

## 2013-10-07 DIAGNOSIS — H26499 Other secondary cataract, unspecified eye: Secondary | ICD-10-CM | POA: Diagnosis not present

## 2013-10-07 DIAGNOSIS — E119 Type 2 diabetes mellitus without complications: Secondary | ICD-10-CM | POA: Diagnosis not present

## 2013-10-09 DIAGNOSIS — S8990XA Unspecified injury of unspecified lower leg, initial encounter: Secondary | ICD-10-CM | POA: Diagnosis not present

## 2013-10-28 ENCOUNTER — Other Ambulatory Visit: Payer: Self-pay | Admitting: Dermatology

## 2013-10-28 DIAGNOSIS — Z85828 Personal history of other malignant neoplasm of skin: Secondary | ICD-10-CM | POA: Diagnosis not present

## 2013-10-28 DIAGNOSIS — L570 Actinic keratosis: Secondary | ICD-10-CM | POA: Diagnosis not present

## 2013-10-28 DIAGNOSIS — L821 Other seborrheic keratosis: Secondary | ICD-10-CM | POA: Diagnosis not present

## 2013-10-28 DIAGNOSIS — D239 Other benign neoplasm of skin, unspecified: Secondary | ICD-10-CM | POA: Diagnosis not present

## 2013-10-28 DIAGNOSIS — C4442 Squamous cell carcinoma of skin of scalp and neck: Secondary | ICD-10-CM | POA: Diagnosis not present

## 2013-10-30 ENCOUNTER — Encounter (HOSPITAL_BASED_OUTPATIENT_CLINIC_OR_DEPARTMENT_OTHER): Payer: Medicare Other | Attending: General Surgery

## 2013-10-30 DIAGNOSIS — I872 Venous insufficiency (chronic) (peripheral): Secondary | ICD-10-CM | POA: Insufficient documentation

## 2013-10-30 DIAGNOSIS — L97809 Non-pressure chronic ulcer of other part of unspecified lower leg with unspecified severity: Secondary | ICD-10-CM | POA: Diagnosis not present

## 2013-11-06 DIAGNOSIS — L97809 Non-pressure chronic ulcer of other part of unspecified lower leg with unspecified severity: Secondary | ICD-10-CM | POA: Diagnosis not present

## 2013-11-06 DIAGNOSIS — I872 Venous insufficiency (chronic) (peripheral): Secondary | ICD-10-CM | POA: Diagnosis not present

## 2013-11-13 DIAGNOSIS — L97809 Non-pressure chronic ulcer of other part of unspecified lower leg with unspecified severity: Secondary | ICD-10-CM | POA: Diagnosis not present

## 2013-11-13 DIAGNOSIS — I872 Venous insufficiency (chronic) (peripheral): Secondary | ICD-10-CM | POA: Diagnosis not present

## 2013-11-20 ENCOUNTER — Encounter (HOSPITAL_BASED_OUTPATIENT_CLINIC_OR_DEPARTMENT_OTHER): Payer: Medicare Other | Attending: General Surgery

## 2013-11-20 DIAGNOSIS — L98499 Non-pressure chronic ulcer of skin of other sites with unspecified severity: Secondary | ICD-10-CM | POA: Diagnosis not present

## 2013-11-20 DIAGNOSIS — I872 Venous insufficiency (chronic) (peripheral): Secondary | ICD-10-CM | POA: Insufficient documentation

## 2013-11-20 DIAGNOSIS — L97809 Non-pressure chronic ulcer of other part of unspecified lower leg with unspecified severity: Secondary | ICD-10-CM | POA: Diagnosis not present

## 2013-11-27 DIAGNOSIS — I872 Venous insufficiency (chronic) (peripheral): Secondary | ICD-10-CM | POA: Diagnosis not present

## 2013-11-27 DIAGNOSIS — L97809 Non-pressure chronic ulcer of other part of unspecified lower leg with unspecified severity: Secondary | ICD-10-CM | POA: Diagnosis not present

## 2013-11-27 DIAGNOSIS — L98499 Non-pressure chronic ulcer of skin of other sites with unspecified severity: Secondary | ICD-10-CM | POA: Diagnosis not present

## 2013-12-04 DIAGNOSIS — L98499 Non-pressure chronic ulcer of skin of other sites with unspecified severity: Secondary | ICD-10-CM | POA: Diagnosis not present

## 2013-12-04 DIAGNOSIS — I872 Venous insufficiency (chronic) (peripheral): Secondary | ICD-10-CM | POA: Diagnosis not present

## 2013-12-04 DIAGNOSIS — L97809 Non-pressure chronic ulcer of other part of unspecified lower leg with unspecified severity: Secondary | ICD-10-CM | POA: Diagnosis not present

## 2013-12-11 DIAGNOSIS — L98499 Non-pressure chronic ulcer of skin of other sites with unspecified severity: Secondary | ICD-10-CM | POA: Diagnosis not present

## 2013-12-11 DIAGNOSIS — I872 Venous insufficiency (chronic) (peripheral): Secondary | ICD-10-CM | POA: Diagnosis not present

## 2013-12-11 DIAGNOSIS — L97809 Non-pressure chronic ulcer of other part of unspecified lower leg with unspecified severity: Secondary | ICD-10-CM | POA: Diagnosis not present

## 2013-12-12 DIAGNOSIS — H6120 Impacted cerumen, unspecified ear: Secondary | ICD-10-CM | POA: Diagnosis not present

## 2013-12-13 DIAGNOSIS — Z85828 Personal history of other malignant neoplasm of skin: Secondary | ICD-10-CM | POA: Diagnosis not present

## 2013-12-13 DIAGNOSIS — L570 Actinic keratosis: Secondary | ICD-10-CM | POA: Diagnosis not present

## 2013-12-13 DIAGNOSIS — L821 Other seborrheic keratosis: Secondary | ICD-10-CM | POA: Diagnosis not present

## 2013-12-30 DIAGNOSIS — C8585 Other specified types of non-Hodgkin lymphoma, lymph nodes of inguinal region and lower limb: Secondary | ICD-10-CM | POA: Diagnosis not present

## 2013-12-30 DIAGNOSIS — E039 Hypothyroidism, unspecified: Secondary | ICD-10-CM | POA: Diagnosis not present

## 2013-12-30 DIAGNOSIS — H1045 Other chronic allergic conjunctivitis: Secondary | ICD-10-CM | POA: Diagnosis not present

## 2013-12-30 DIAGNOSIS — Z6832 Body mass index (BMI) 32.0-32.9, adult: Secondary | ICD-10-CM | POA: Diagnosis not present

## 2013-12-30 DIAGNOSIS — M353 Polymyalgia rheumatica: Secondary | ICD-10-CM | POA: Diagnosis not present

## 2013-12-30 DIAGNOSIS — E669 Obesity, unspecified: Secondary | ICD-10-CM | POA: Diagnosis not present

## 2013-12-30 DIAGNOSIS — E119 Type 2 diabetes mellitus without complications: Secondary | ICD-10-CM | POA: Diagnosis not present

## 2013-12-30 DIAGNOSIS — I10 Essential (primary) hypertension: Secondary | ICD-10-CM | POA: Diagnosis not present

## 2013-12-30 DIAGNOSIS — H04129 Dry eye syndrome of unspecified lacrimal gland: Secondary | ICD-10-CM | POA: Diagnosis not present

## 2013-12-30 DIAGNOSIS — D696 Thrombocytopenia, unspecified: Secondary | ICD-10-CM | POA: Diagnosis not present

## 2013-12-30 DIAGNOSIS — H43819 Vitreous degeneration, unspecified eye: Secondary | ICD-10-CM | POA: Diagnosis not present

## 2014-01-06 ENCOUNTER — Ambulatory Visit (INDEPENDENT_AMBULATORY_CARE_PROVIDER_SITE_OTHER): Payer: Medicare Other | Admitting: Cardiovascular Disease

## 2014-01-06 ENCOUNTER — Encounter: Payer: Self-pay | Admitting: Cardiovascular Disease

## 2014-01-06 VITALS — BP 136/74 | HR 68 | Ht 69.0 in | Wt 218.0 lb

## 2014-01-06 DIAGNOSIS — I2510 Atherosclerotic heart disease of native coronary artery without angina pectoris: Secondary | ICD-10-CM | POA: Diagnosis not present

## 2014-01-06 NOTE — Patient Instructions (Signed)
Your physician recommends that you continue on your current medications as directed. Please refer to the Current Medication list given to you today.  Your physician wants you to follow-up in: 6 months with Dr. Nahser.  You will receive a reminder letter in the mail two months in advance. If you don't receive a letter, please call our office to schedule the follow-up appointment.  

## 2014-01-06 NOTE — Assessment & Plan Note (Signed)
Sabastien has done well. He's not had any further episodes of dyspnea. His echocardiogram revealed normal LV function. He may have some degree of diastolic dysfunction. He does have mild LVH and has left atrial enlargement.  His Myoview study was unremarkable

## 2014-01-06 NOTE — Progress Notes (Signed)
Nathan Kemp Date of Birth  1940/12/17 [facility] [address]    [place], [place]  [address] [phone]  Fax  [phone]   Problem List 1. CAD - PCI of RCA 2006.  He had a 3.5 x 24 mm Liberte stent placed. We used a 4.5 mm noncompliant balloon for post dilatation. 2. Polymyalgia rheumatica 3. History of thrombocytopenia - ? Hx of Non-hodgkins lymphma 4. Irritable bowel syndrome 5. Hypothyroidism 6. Hypertension 7. Diabetes mellitus  History of Present Illness:  73 year old gentleman with a history of coronary artery disease. He has a history of PTCA and stenting of his right coronary artery in 2006.  He had a 3.5 x 24 mm Liberte stent placed. We used a 4.5 mm noncompliant balloon for post dilatation.   He's done well from a cardiac standpoint since that time.  He's not had any episodes of chest pain or shortness breath.  I saw him last year for anorexia and weight loss. He also had some thrombocytopenia. He's had a very extensive workup at [facility] and at the [facility] So far they've not found any clear-cut etiology for his low platelet counts and liver function abnormalities.  He has generally improved since that time. The plan now is to continue to observe but without any further testing until he develops any other symptoms.  Jan. 12, 2015:  Nathan Kemp was last seen in Sept. 2012: Nathan Kemp has been having some DOE - climbing stairs.   Has been present 4-5 weeks.  Also c/o fatigue, lack of energy.    Also has occasional episodes of dizziness.   He thinks the symptoms are similar to his angina prior to his stenting.  He is active ( [employer]).  No CP, no syncope.    Also has been diagnosed with PMR.     He had an episode of Thrombocytopenia and required platelet transfusion.  He has been on prednisone since that time.    June 24, 2013:  Feeling better.   Walking regularly - up to 22 minutes a day. No angina Get some chest pain earlier this  would appear to stress Myoview study was negative for ischemia.  01/06/2014:  Feeling better.  His dyspnea has resolved.   Exercising regularly.     Current Outpatient Prescriptions on File Prior to Visit  Medication Sig Dispense Refill  . acetaminophen (TYLENOL ARTHRITIS PAIN) 650 MG CR tablet Take 650 mg by mouth 3 (three) times daily.       . cholestyramine (QUESTRAN) 4 G packet 4 g daily.       Marland Kitchen dicyclomine (BENTYL) 20 MG tablet       . doxycycline (VIBRA-TABS) 100 MG tablet       . insulin regular (NOVOLIN R,HUMULIN R) 100 units/mL injection Inject 6 Units into the skin as needed. For glucose greater than 175      . levothyroxine (SYNTHROID, LEVOTHROID) 100 MCG tablet Take 100 mcg by mouth daily.      Marland Kitchen NITROSTAT 0.4 MG SL tablet PLACE 1 TABLET UNDER THE TONGUE EVERY 5 MINUTES AS NEEDED FOR CHEST PAIN  25 tablet  0  . olmesartan-hydrochlorothiazide (BENICAR HCT) 40-25 MG per tablet Take 0.5 tablets by mouth every morning.       Nathan Kemp Glycol-Propyl Glycol (SYSTANE ULTRA OP) Apply 1-2 drops to eye as needed.      . predniSONE (STERAPRED UNI-PAK) 5 MG TABS tablet Take 2.5 mg by mouth daily. M+F 2.5 mg (  tapering)      . zolpidem (AMBIEN) 10 MG tablet Take 10 mg by mouth at bedtime.        No current facility-administered medications on file prior to visit.    Allergies  Allergen Reactions  . Cefdinir Hives  . Adhesive [Tape] Other (See Comments)    blisters  . Lortab [Hydrocodone-Acetaminophen] Other (See Comments)    Makes patient very loopy. Update   Able to take now  . Niaspan [Niacin] Hives  . Clindamycin Nausea Only and Other (See Comments)    Stomach cramps  . Codeine Rash    Had rash previously but not recently-taken in cough medicine and no problems but patient can tolerate    Past Medical History  Diagnosis Date  . Anorexia   . Thrombocytopenia   . Fatigue   . Hypertension   . Hyperlipidemia   . Vitamin D deficiency   . Obesity   . DDD (degenerative  disc disease), cervical   . Thyroid disease   . Trouble swallowing   . Cough   . Leg swelling     due to lymphedema  . Hypothyroidism   . Diabetes mellitus     borderline-controlled by diet  . Acute GI bleeding 04/16/2011  . Coronary artery disease     stent 06  . Heart murmur   . Asthma     when  have congstion  of chest  . Pneumonia   . Anemia   . Blood dyscrasia 06/2009    thrombocytopenia  . Blood transfusion     platelets 12/12  . GERD (gastroesophageal reflux disease)   . 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 ED (erectile dysfunction)   . Polymyalgia   . IBS (irritable bowel syndrome)     Past Surgical History  Procedure Laterality Date  . Cardiac catheterization  01/31/2005    EF 60-65%  . Coronary angioplasty with stent placement  2006    STENTING OF HIS CORONARY ARTERY  . Appendectomy  1967  . US echocardiography  02/28/2005    EF 55-60%  . Tonsillectomy and adenoidectomy    . Nasal sinus surgery  2005  . Edg dilitation  [id number]  . Lymph node removal  2011    right groin  . Bone marrow biopsy      2011-at [facility], 01/2010-at [place], [facility]  . Lymph node biopsy  03/15/2011    Procedure: LYMPH NODE BIOPSY;  Surgeon: Earnstine Regal, MD;  Location: [facility];  Service: General;  Laterality: Right;  Right Anterior Cervical Lymph Node Excisional Biopsy   . 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 Cataract Bilateral     History  Smoking status  . Never Smoker   Smokeless tobacco  . Never Used    History  Alcohol Use No    Family History  Problem Relation Age of Onset  . Heart failure Father   . Hypertension Father   . Hearing loss Father 13    congestive heart failure  . Diabetes Father   . Hypertension Mother   . Stroke Mother 48    Reviw of Systems:  Reviewed in the HPI.  All other systems are negative.  Physical Exam: BP 136/74  Pulse 68  Ht '5\' 9"'  (1.753 m)  Wt 218 lb  (98.884 kg)  BMI 32.18 kg/m2 The patient is alert and oriented x  3.  The mood and affect are normal.   Skin: warm and dry.  Color is normal.    HEENT:   the sclera are nonicteric.  The mucous membranes are moist.  The carotids are 2+ without bruits.  There is no thyromegaly.  There is no JVD.   Lungs: clear.  The chest wall is non tender.   Heart: regular rate with a normal S1 and S2.   Occasional premature beatsThere is a soft systolic murmur. The PMI is not displaced.    Abdomen: good bowel sounds.  There is no guarding or rebound.  There is no hepatosplenomegaly or tenderness.  There are no masses.  Extremities:  There  Is trace edema and chronic stasis changes on left side.  The right lower leg has 1+.   He has some chronic stasis changes.  The distal pulses are intact.  Neuro:  Cranial nerves II - XII are intact.  Motor and sensory functions are intact.   The gait is normal.  ECG: Assessment / Plan:

## 2014-01-15 DIAGNOSIS — M19049 Primary osteoarthritis, unspecified hand: Secondary | ICD-10-CM | POA: Diagnosis not present

## 2014-01-15 DIAGNOSIS — M79609 Pain in unspecified limb: Secondary | ICD-10-CM | POA: Diagnosis not present

## 2014-01-16 DIAGNOSIS — Z23 Encounter for immunization: Secondary | ICD-10-CM | POA: Diagnosis not present

## 2014-01-27 DIAGNOSIS — H04123 Dry eye syndrome of bilateral lacrimal glands: Secondary | ICD-10-CM | POA: Diagnosis not present

## 2014-01-27 DIAGNOSIS — H43811 Vitreous degeneration, right eye: Secondary | ICD-10-CM | POA: Diagnosis not present

## 2014-02-10 DIAGNOSIS — S80812A Abrasion, left lower leg, initial encounter: Secondary | ICD-10-CM | POA: Diagnosis not present

## 2014-02-10 DIAGNOSIS — H43812 Vitreous degeneration, left eye: Secondary | ICD-10-CM | POA: Diagnosis not present

## 2014-02-10 DIAGNOSIS — Z6832 Body mass index (BMI) 32.0-32.9, adult: Secondary | ICD-10-CM | POA: Diagnosis not present

## 2014-02-17 ENCOUNTER — Telehealth: Payer: Self-pay | Admitting: Oncology

## 2014-02-17 ENCOUNTER — Other Ambulatory Visit (HOSPITAL_BASED_OUTPATIENT_CLINIC_OR_DEPARTMENT_OTHER): Payer: Medicare Other

## 2014-02-17 ENCOUNTER — Ambulatory Visit (HOSPITAL_BASED_OUTPATIENT_CLINIC_OR_DEPARTMENT_OTHER): Payer: Medicare Other | Admitting: Oncology

## 2014-02-17 VITALS — BP 152/79 | HR 95 | Temp 98.3°F | Resp 18 | Ht 69.0 in | Wt 215.9 lb

## 2014-02-17 DIAGNOSIS — E039 Hypothyroidism, unspecified: Secondary | ICD-10-CM | POA: Diagnosis not present

## 2014-02-17 DIAGNOSIS — D696 Thrombocytopenia, unspecified: Secondary | ICD-10-CM | POA: Diagnosis not present

## 2014-02-17 LAB — CBC WITH DIFFERENTIAL/PLATELET
BASO%: 0.4 % (ref 0.0–2.0)
BASOS ABS: 0 10*3/uL (ref 0.0–0.1)
EOS%: 1.9 % (ref 0.0–7.0)
Eosinophils Absolute: 0.1 10*3/uL (ref 0.0–0.5)
HEMATOCRIT: 45.3 % (ref 38.4–49.9)
HGB: 14.6 g/dL (ref 13.0–17.1)
LYMPH%: 16.5 % (ref 14.0–49.0)
MCH: 29.4 pg (ref 27.2–33.4)
MCHC: 32.3 g/dL (ref 32.0–36.0)
MCV: 90.9 fL (ref 79.3–98.0)
MONO#: 2.4 10*3/uL — ABNORMAL HIGH (ref 0.1–0.9)
MONO%: 34.3 % — AB (ref 0.0–14.0)
NEUT#: 3.2 10*3/uL (ref 1.5–6.5)
NEUT%: 46.9 % (ref 39.0–75.0)
Platelets: 141 10*3/uL (ref 140–400)
RBC: 4.98 10*6/uL (ref 4.20–5.82)
RDW: 15.5 % — ABNORMAL HIGH (ref 11.0–14.6)
WBC: 6.9 10*3/uL (ref 4.0–10.3)
lymph#: 1.1 10*3/uL (ref 0.9–3.3)

## 2014-02-17 LAB — TECHNOLOGIST REVIEW

## 2014-02-17 NOTE — Progress Notes (Signed)
  [facility] OFFICE PROGRESS NOTE   Diagnosis: thrombocytopenia  INTERVAL HISTORY:   Mr. Wholey returns as scheduled. He notes increased fatigue over the past few weeks. No fever, night sweats, or palpable lymph nodes. He has recurrent skin tears with trauma. He has an appointment at the wound clinic next week to evaluate an ulcer at the left pretibial area.He remains on low-dose prednisone for treatment of PMR.  Objective:  Vital signs in last 24 hours:  Blood pressure 152/79, pulse 95, temperature 98.3 F (36.8 C), temperature source Oral, resp. rate 18, height $RemoveBe'5\' 9"'sVVgGfIBN$  (1.753 m), weight 215 lb 14.4 oz (97.932 kg).    HEENT: oropharynx without visible mass, neck without mass Lymphatics: no cervical,supraclavicular, axillary, or inguinal nodes. Prominent bilateral axillary fat pads. Resp: lungs with a few end inspiratory rhonchi at the right base, no respiratory distress Cardio: regular rate and rhythm GI: no hepatosplenomegaly Vascular: trace low leg edema bilaterally   Skin:hyperpigmentation at the lower legs bilaterally, bandage in place at the left pretibial region     Lab Results:  Lab Results  Component Value Date   WBC 6.9 02/17/2014   HGB 14.6 02/17/2014   HCT 45.3 02/17/2014   MCV 90.9 02/17/2014   PLT 141 02/17/2014   NEUTROABS 3.2 02/17/2014    Medications: I have reviewed the patient's current medications.  Assessment/Plan: 1.History of splenomegaly - the spleen has been palpable on intermittent office visits in the past. Splenomegaly was confirmed on an abdominal ultrasound 06/14/2010.  2.history of Elevated liver enzymes.  3.History of Pancytopenia.  4.History of a Systemic illness characterized by intermittent fever, weight loss, and malaise-improved.  5.History of coronary artery disease.  6.Bone marrow biopsy 11/27/2009 at the [facility] and on 12/19/2009 at [facility] confirmed a hypercellular marrow with mild reticulin  fibrosis and no evidence of malignancy.  7.Liver biopsy 02/10/2010 revealed minimal portal and lobular inflammation with evidence of steatosis.  8.Excisional biopsy of a right inguinal lymph node on 02/19/2010 at [facility] with the pathology revealing no evidence of a malignancy.  9.Hypothyroidism - maintained on thyroid hormone replacement.  10.Erectile dysfunction - the testosterone level was low 05/13/2010.  11.Hepatomegaly on the abdominal ultrasound 06/14/2010.  12.Skin hyperpigmentation over the extremities.  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  16.T11 compression fracture. Status post a kyphoplasty procedure Dr. Sherwood Gambler on 07/27/2011. The back pain has improved  17. PMR-maintained on prednisone and followed by Dr. Reynaldo Minium    Disposition:  The platelet count is now in the low normal range. There is no clinical evidence for a progressive lymphoproliferative disorder. Mr. Zappia will return for an office visit and CBC in 6 months.  Betsy Coder, MD  02/17/2014  3:23 PM

## 2014-02-17 NOTE — Telephone Encounter (Signed)
gv adn printed appt sched and avs for pt for May 2016 °

## 2014-02-17 NOTE — Patient Instructions (Signed)
Please provide our office a copy of your Healthcare POA/Living Will to be scanned into the computer system at your next appointment.

## 2014-02-26 ENCOUNTER — Encounter (HOSPITAL_BASED_OUTPATIENT_CLINIC_OR_DEPARTMENT_OTHER): Payer: Medicare Other | Attending: General Surgery

## 2014-03-03 DIAGNOSIS — H10413 Chronic giant papillary conjunctivitis, bilateral: Secondary | ICD-10-CM | POA: Diagnosis not present

## 2014-03-03 DIAGNOSIS — H04123 Dry eye syndrome of bilateral lacrimal glands: Secondary | ICD-10-CM | POA: Diagnosis not present

## 2014-03-06 ENCOUNTER — Encounter (HOSPITAL_BASED_OUTPATIENT_CLINIC_OR_DEPARTMENT_OTHER): Payer: Medicare Other | Attending: Internal Medicine

## 2014-03-10 DIAGNOSIS — H6122 Impacted cerumen, left ear: Secondary | ICD-10-CM | POA: Diagnosis not present

## 2014-04-07 DIAGNOSIS — H04123 Dry eye syndrome of bilateral lacrimal glands: Secondary | ICD-10-CM | POA: Diagnosis not present

## 2014-04-21 DIAGNOSIS — H04123 Dry eye syndrome of bilateral lacrimal glands: Secondary | ICD-10-CM | POA: Diagnosis not present

## 2014-04-21 DIAGNOSIS — H10413 Chronic giant papillary conjunctivitis, bilateral: Secondary | ICD-10-CM | POA: Diagnosis not present

## 2014-04-22 DIAGNOSIS — E039 Hypothyroidism, unspecified: Secondary | ICD-10-CM | POA: Diagnosis not present

## 2014-04-22 DIAGNOSIS — I10 Essential (primary) hypertension: Secondary | ICD-10-CM | POA: Diagnosis not present

## 2014-04-22 DIAGNOSIS — Z125 Encounter for screening for malignant neoplasm of prostate: Secondary | ICD-10-CM | POA: Diagnosis not present

## 2014-04-22 DIAGNOSIS — E119 Type 2 diabetes mellitus without complications: Secondary | ICD-10-CM | POA: Diagnosis not present

## 2014-04-28 DIAGNOSIS — C8305 Small cell B-cell lymphoma, lymph nodes of inguinal region and lower limb: Secondary | ICD-10-CM | POA: Diagnosis not present

## 2014-04-28 DIAGNOSIS — E669 Obesity, unspecified: Secondary | ICD-10-CM | POA: Diagnosis not present

## 2014-04-28 DIAGNOSIS — Z0000 Encounter for general adult medical examination without abnormal findings: Secondary | ICD-10-CM | POA: Diagnosis not present

## 2014-04-28 DIAGNOSIS — E039 Hypothyroidism, unspecified: Secondary | ICD-10-CM | POA: Diagnosis not present

## 2014-04-28 DIAGNOSIS — Z1389 Encounter for screening for other disorder: Secondary | ICD-10-CM | POA: Diagnosis not present

## 2014-04-28 DIAGNOSIS — M353 Polymyalgia rheumatica: Secondary | ICD-10-CM | POA: Diagnosis not present

## 2014-04-28 DIAGNOSIS — Z125 Encounter for screening for malignant neoplasm of prostate: Secondary | ICD-10-CM | POA: Diagnosis not present

## 2014-04-28 DIAGNOSIS — E119 Type 2 diabetes mellitus without complications: Secondary | ICD-10-CM | POA: Diagnosis not present

## 2014-04-28 DIAGNOSIS — I10 Essential (primary) hypertension: Secondary | ICD-10-CM | POA: Diagnosis not present

## 2014-04-30 DIAGNOSIS — H01001 Unspecified blepharitis right upper eyelid: Secondary | ICD-10-CM | POA: Diagnosis not present

## 2014-04-30 DIAGNOSIS — Z1212 Encounter for screening for malignant neoplasm of rectum: Secondary | ICD-10-CM | POA: Diagnosis not present

## 2014-04-30 DIAGNOSIS — H01004 Unspecified blepharitis left upper eyelid: Secondary | ICD-10-CM | POA: Diagnosis not present

## 2014-04-30 DIAGNOSIS — H01005 Unspecified blepharitis left lower eyelid: Secondary | ICD-10-CM | POA: Diagnosis not present

## 2014-04-30 DIAGNOSIS — H01002 Unspecified blepharitis right lower eyelid: Secondary | ICD-10-CM | POA: Diagnosis not present

## 2014-05-05 DIAGNOSIS — D3617 Benign neoplasm of peripheral nerves and autonomic nervous system of trunk, unspecified: Secondary | ICD-10-CM | POA: Diagnosis not present

## 2014-05-05 DIAGNOSIS — L570 Actinic keratosis: Secondary | ICD-10-CM | POA: Diagnosis not present

## 2014-05-05 DIAGNOSIS — L821 Other seborrheic keratosis: Secondary | ICD-10-CM | POA: Diagnosis not present

## 2014-05-05 DIAGNOSIS — Z85828 Personal history of other malignant neoplasm of skin: Secondary | ICD-10-CM | POA: Diagnosis not present

## 2014-06-16 DIAGNOSIS — L244 Irritant contact dermatitis due to drugs in contact with skin: Secondary | ICD-10-CM | POA: Diagnosis not present

## 2014-06-23 DIAGNOSIS — H6123 Impacted cerumen, bilateral: Secondary | ICD-10-CM | POA: Diagnosis not present

## 2014-06-23 DIAGNOSIS — H6062 Unspecified chronic otitis externa, left ear: Secondary | ICD-10-CM | POA: Diagnosis not present

## 2014-07-11 ENCOUNTER — Other Ambulatory Visit: Payer: Self-pay

## 2014-07-11 MED ORDER — NITROGLYCERIN 0.4 MG SL SUBL: SUBLINGUAL_TABLET | SUBLINGUAL | Status: AC

## 2014-08-07 DIAGNOSIS — M1811 Unilateral primary osteoarthritis of first carpometacarpal joint, right hand: Secondary | ICD-10-CM | POA: Diagnosis not present

## 2014-08-11 DIAGNOSIS — F801 Expressive language disorder: Secondary | ICD-10-CM | POA: Diagnosis not present

## 2014-08-11 DIAGNOSIS — N529 Male erectile dysfunction, unspecified: Secondary | ICD-10-CM | POA: Diagnosis not present

## 2014-08-11 DIAGNOSIS — Z6832 Body mass index (BMI) 32.0-32.9, adult: Secondary | ICD-10-CM | POA: Diagnosis not present

## 2014-08-11 DIAGNOSIS — M353 Polymyalgia rheumatica: Secondary | ICD-10-CM | POA: Diagnosis not present

## 2014-08-11 DIAGNOSIS — E119 Type 2 diabetes mellitus without complications: Secondary | ICD-10-CM | POA: Diagnosis not present

## 2014-08-11 DIAGNOSIS — K219 Gastro-esophageal reflux disease without esophagitis: Secondary | ICD-10-CM | POA: Diagnosis not present

## 2014-08-11 DIAGNOSIS — I10 Essential (primary) hypertension: Secondary | ICD-10-CM | POA: Diagnosis not present

## 2014-08-11 DIAGNOSIS — E669 Obesity, unspecified: Secondary | ICD-10-CM | POA: Diagnosis not present

## 2014-08-18 ENCOUNTER — Other Ambulatory Visit (HOSPITAL_BASED_OUTPATIENT_CLINIC_OR_DEPARTMENT_OTHER): Payer: Medicare Other

## 2014-08-18 ENCOUNTER — Telehealth: Payer: Self-pay | Admitting: Oncology

## 2014-08-18 ENCOUNTER — Ambulatory Visit (HOSPITAL_BASED_OUTPATIENT_CLINIC_OR_DEPARTMENT_OTHER): Payer: Medicare Other | Admitting: Oncology

## 2014-08-18 VITALS — BP 136/85 | HR 73 | Temp 98.4°F | Resp 16 | Ht 69.0 in | Wt 214.3 lb

## 2014-08-18 DIAGNOSIS — D696 Thrombocytopenia, unspecified: Secondary | ICD-10-CM | POA: Diagnosis not present

## 2014-08-18 DIAGNOSIS — R4701 Aphasia: Secondary | ICD-10-CM

## 2014-08-18 LAB — CBC WITH DIFFERENTIAL/PLATELET
BASO%: 0.1 % (ref 0.0–2.0)
Basophils Absolute: 0 10*3/uL (ref 0.0–0.1)
EOS%: 0.7 % (ref 0.0–7.0)
Eosinophils Absolute: 0.1 10*3/uL (ref 0.0–0.5)
HCT: 43.9 % (ref 38.4–49.9)
HEMOGLOBIN: 14.5 g/dL (ref 13.0–17.1)
LYMPH#: 2.1 10*3/uL (ref 0.9–3.3)
LYMPH%: 23.7 % (ref 14.0–49.0)
MCH: 30 pg (ref 27.2–33.4)
MCHC: 33 g/dL (ref 32.0–36.0)
MCV: 90.7 fL (ref 79.3–98.0)
MONO#: 1.7 10*3/uL — ABNORMAL HIGH (ref 0.1–0.9)
MONO%: 19.2 % — ABNORMAL HIGH (ref 0.0–14.0)
NEUT%: 56.3 % (ref 39.0–75.0)
NEUTROS ABS: 4.9 10*3/uL (ref 1.5–6.5)
Platelets: 111 10*3/uL — ABNORMAL LOW (ref 140–400)
RBC: 4.84 10*6/uL (ref 4.20–5.82)
RDW: 15.6 % — AB (ref 11.0–14.6)
WBC: 8.7 10*3/uL (ref 4.0–10.3)
nRBC: 0 % (ref 0–0)

## 2014-08-18 NOTE — Progress Notes (Signed)
  [facility] OFFICE PROGRESS NOTE   Diagnosis: Thrombocytopenia  INTERVAL HISTORY:   Nathan Kemp returns as scheduled. He continues to have malaise. He remains on prednisone for treatment of PMR. He has been unable to wean the prednisone dose. He was seen at the wound center for treatment of "cuts "at the legs. No fever or night sweats. No palpable lymph nodes. He reports good control of his blood sugar.  He complains of difficulty with expressive aphasia for the past 3 months. He has been referred to neurology.  Objective:  Vital signs in last 24 hours:  Blood pressure 136/85, pulse 73, temperature 98.4 F (36.9 C), temperature source Oral, resp. rate 16, height 5' 9" (1.753 m), weight 214 lb 4.8 oz (97.206 kg), SpO2 96 %.    HEENT: Neck without mass Lymphatics: No cervical, supraclavicular, axillary, or inguinal nodes. Prominent bilateral axillary fat pads Resp: End inspiratory rales at the right posterior base, no respiratory distress Cardio: Regular rate and rhythm GI: No hepatosplenomegaly, nontender Vascular: No leg edema  Skin: Hyperpigmentation of the lower legs Neurologic: The speech is fluent   Portacath/PICC-without erythema  Lab Results:  Lab Results  Component Value Date   WBC 8.7 08/18/2014   HGB 14.5 08/18/2014   HCT 43.9 08/18/2014   MCV 90.7 08/18/2014   PLT 111* 08/18/2014   NEUTROABS 4.9 08/18/2014   Medications: I have reviewed the patient's current medications.  Assessment/Plan: 1.History of splenomegaly - the spleen has been palpable on intermittent office visits in the past. Splenomegaly was confirmed on an abdominal ultrasound 06/14/2010.  2.history of Elevated liver enzymes.  3.History of Pancytopenia.  4.History of a Systemic illness characterized by intermittent fever, weight loss, and malaise-improved.  5.History of coronary artery disease.  6.Bone marrow biopsy 11/27/2009 at the [facility] and on  12/19/2009 at [facility] confirmed a hypercellular marrow with mild reticulin fibrosis and no evidence of malignancy.  7.Liver biopsy 02/10/2010 revealed minimal portal and lobular inflammation with evidence of steatosis.  8.Excisional biopsy of a right inguinal lymph node on 02/19/2010 at [facility] with the pathology revealing no evidence of a malignancy.  9.Hypothyroidism - maintained on thyroid hormone replacement.  10.Erectile dysfunction - the testosterone level was low 05/13/2010.  11.Hepatomegaly on the abdominal ultrasound 06/14/2010.  12.Skin hyperpigmentation over the extremities.  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  16.T11 compression fracture. Status post a kyphoplasty procedure Dr. Sherwood Gambler on 07/27/2011. The back pain has improved  17. PMR-maintained on prednisone and followed by Dr. Reynaldo Minium    Disposition:  He has mild thrombocytopenia today. He is otherwise stable from a hematologic standpoint. No clinical evidence of a lymphoproliferative disorder. He will contact us for bleeding.  Nathan Kemp will return for an office visit and CBC in 6 months.  He will see neurology to evaluate the expressive aphasia.  Betsy Coder, MD  08/18/2014  8:46 AM

## 2014-08-18 NOTE — Telephone Encounter (Signed)
per pof ot sch pt appt-gave pt copy of sch °

## 2014-08-26 DIAGNOSIS — H6121 Impacted cerumen, right ear: Secondary | ICD-10-CM | POA: Diagnosis not present

## 2014-08-26 DIAGNOSIS — H6061 Unspecified chronic otitis externa, right ear: Secondary | ICD-10-CM | POA: Diagnosis not present

## 2014-09-08 ENCOUNTER — Ambulatory Visit: Payer: Medicare Other | Admitting: Neurology

## 2014-10-27 ENCOUNTER — Emergency Department (HOSPITAL_COMMUNITY): Payer: Medicare Other

## 2014-10-27 ENCOUNTER — Inpatient Hospital Stay (HOSPITAL_COMMUNITY)
Admission: EM | Admit: 2014-10-27 | Discharge: 2014-10-29 | DRG: 149 | Disposition: A | Discharge status: Home / Self Care | Payer: Medicare Other | Attending: Internal Medicine | Admitting: Internal Medicine

## 2014-10-27 ENCOUNTER — Encounter (HOSPITAL_COMMUNITY): Payer: Self-pay | Admitting: Emergency Medicine

## 2014-10-27 DIAGNOSIS — K580 Irritable bowel syndrome with diarrhea: Secondary | ICD-10-CM | POA: Diagnosis present

## 2014-10-27 DIAGNOSIS — Z791 Long term (current) use of non-steroidal anti-inflammatories (NSAID): Secondary | ICD-10-CM | POA: Diagnosis not present

## 2014-10-27 DIAGNOSIS — R0902 Hypoxemia: Secondary | ICD-10-CM

## 2014-10-27 DIAGNOSIS — M5032 Other cervical disc degeneration, mid-cervical region: Secondary | ICD-10-CM | POA: Diagnosis not present

## 2014-10-27 DIAGNOSIS — G311 Senile degeneration of brain, not elsewhere classified: Secondary | ICD-10-CM | POA: Diagnosis not present

## 2014-10-27 DIAGNOSIS — E119 Type 2 diabetes mellitus without complications: Secondary | ICD-10-CM

## 2014-10-27 DIAGNOSIS — R42 Dizziness and giddiness: Principal | ICD-10-CM

## 2014-10-27 DIAGNOSIS — Z955 Presence of coronary angioplasty implant and graft: Secondary | ICD-10-CM

## 2014-10-27 DIAGNOSIS — Z8719 Personal history of other diseases of the digestive system: Secondary | ICD-10-CM

## 2014-10-27 DIAGNOSIS — T39395A Adverse effect of other nonsteroidal anti-inflammatory drugs [NSAID], initial encounter: Secondary | ICD-10-CM | POA: Diagnosis present

## 2014-10-27 DIAGNOSIS — E785 Hyperlipidemia, unspecified: Secondary | ICD-10-CM | POA: Diagnosis present

## 2014-10-27 DIAGNOSIS — Z794 Long term (current) use of insulin: Secondary | ICD-10-CM

## 2014-10-27 DIAGNOSIS — Z885 Allergy status to narcotic agent status: Secondary | ICD-10-CM

## 2014-10-27 DIAGNOSIS — M4312 Spondylolisthesis, cervical region: Secondary | ICD-10-CM | POA: Diagnosis not present

## 2014-10-27 DIAGNOSIS — K219 Gastro-esophageal reflux disease without esophagitis: Secondary | ICD-10-CM | POA: Diagnosis not present

## 2014-10-27 DIAGNOSIS — Z79899 Other long term (current) drug therapy: Secondary | ICD-10-CM

## 2014-10-27 DIAGNOSIS — R404 Transient alteration of awareness: Secondary | ICD-10-CM | POA: Diagnosis not present

## 2014-10-27 DIAGNOSIS — Z91048 Other nonmedicinal substance allergy status: Secondary | ICD-10-CM | POA: Diagnosis not present

## 2014-10-27 DIAGNOSIS — I10 Essential (primary) hypertension: Secondary | ICD-10-CM | POA: Diagnosis present

## 2014-10-27 DIAGNOSIS — J9811 Atelectasis: Secondary | ICD-10-CM | POA: Diagnosis not present

## 2014-10-27 DIAGNOSIS — Z7952 Long term (current) use of systemic steroids: Secondary | ICD-10-CM

## 2014-10-27 DIAGNOSIS — D696 Thrombocytopenia, unspecified: Secondary | ICD-10-CM | POA: Diagnosis present

## 2014-10-27 DIAGNOSIS — Z881 Allergy status to other antibiotic agents status: Secondary | ICD-10-CM | POA: Diagnosis not present

## 2014-10-27 DIAGNOSIS — M353 Polymyalgia rheumatica: Secondary | ICD-10-CM | POA: Diagnosis present

## 2014-10-27 DIAGNOSIS — I2510 Atherosclerotic heart disease of native coronary artery without angina pectoris: Secondary | ICD-10-CM | POA: Diagnosis present

## 2014-10-27 DIAGNOSIS — I639 Cerebral infarction, unspecified: Secondary | ICD-10-CM

## 2014-10-27 DIAGNOSIS — M9971 Connective tissue and disc stenosis of intervertebral foramina of cervical region: Secondary | ICD-10-CM | POA: Diagnosis not present

## 2014-10-27 DIAGNOSIS — E039 Hypothyroidism, unspecified: Secondary | ICD-10-CM | POA: Diagnosis present

## 2014-10-27 DIAGNOSIS — R197 Diarrhea, unspecified: Secondary | ICD-10-CM | POA: Diagnosis present

## 2014-10-27 DIAGNOSIS — I6330 Cerebral infarction due to thrombosis of unspecified cerebral artery: Secondary | ICD-10-CM | POA: Insufficient documentation

## 2014-10-27 DIAGNOSIS — E669 Obesity, unspecified: Secondary | ICD-10-CM | POA: Diagnosis present

## 2014-10-27 DIAGNOSIS — M47816 Spondylosis without myelopathy or radiculopathy, lumbar region: Secondary | ICD-10-CM | POA: Diagnosis not present

## 2014-10-27 HISTORY — DX: Essential (primary) hypertension: I10

## 2014-10-27 LAB — I-STAT CHEM 8, ED
BUN: 20 mg/dL (ref 6–20)
Calcium, Ion: 1.21 mmol/L (ref 1.13–1.30)
Chloride: 103 mmol/L (ref 101–111)
Creatinine, Ser: 0.7 mg/dL (ref 0.61–1.24)
GLUCOSE: 207 mg/dL — AB (ref 65–99)
HCT: 43 % (ref 39.0–52.0)
Hemoglobin: 14.6 g/dL (ref 13.0–17.0)
POTASSIUM: 3.9 mmol/L (ref 3.5–5.1)
SODIUM: 141 mmol/L (ref 135–145)
TCO2: 25 mmol/L (ref 0–100)

## 2014-10-27 LAB — I-STAT TROPONIN, ED: Troponin i, poc: 0 ng/mL (ref 0.00–0.08)

## 2014-10-27 MED ORDER — ONDANSETRON HCL 4 MG/2ML IJ SOLN
INTRAMUSCULAR | Status: AC
  Administered 2014-10-27: 4 mg
  Filled 2014-10-27: qty 2

## 2014-10-27 NOTE — ED Provider Notes (Signed)
CSN: [id number]     Arrival date & time 10/27/14  2317 History  This chart was scribed for Linton Flemings, MD by Chester Holstein, ED Scribe. This patient was seen in room D35C/D35C and the patient's care was started at 11:23 PM.    Chief Complaint  Patient presents with  . Dizziness     The history is provided by the patient, the EMS personnel and the spouse. No language interpreter was used.   HPI Comments: Nathan Kemp is a 74 y.o. male brought in by ambulance, with h/o stent placement, DM, HTN, HLD, thyroid disease, CAD, GERD, CA, IBS, who presents to the Emergency Department complaining of dizziness with acute onset around 7:30 PM. Pt had a temporary dental crown placed today and took one dose of Lodine as prescribed this evening around 6 PM just after to dinner. Pt notes associated nausea, vomiting, diarrhea, and diaphoresei. Pt was given ASA and Zofran by EMS PTA. Pt denies chest pain, SOB, and cough.  Past Medical History  Diagnosis Date  . Anorexia   . Thrombocytopenia   . Fatigue   . Hypertension   . Hyperlipidemia   . Vitamin D deficiency   . Obesity   . DDD (degenerative disc disease), cervical   . Thyroid disease   . Trouble swallowing   . Cough   . Leg swelling     due to lymphedema  . Hypothyroidism   . Diabetes mellitus     borderline-controlled by diet  . Acute GI bleeding 04/16/2011  . Coronary artery disease     stent 06  . Heart murmur   . Asthma     when  have congstion  of chest  . Pneumonia   . Anemia   . Blood dyscrasia 06/2009    thrombocytopenia  . Blood transfusion     platelets 12/12  . GERD (gastroesophageal reflux disease)   . 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 ED (erectile dysfunction)   . Polymyalgia   . IBS (irritable bowel syndrome)    Past Surgical History  Procedure Laterality Date  . Cardiac catheterization  01/31/2005    EF 60-65%  . Coronary angioplasty with stent placement  2006    STENTING OF HIS CORONARY ARTERY  .  Appendectomy  1967  . US echocardiography  02/28/2005    EF 55-60%  . Tonsillectomy and adenoidectomy    . Nasal sinus surgery  2005  . Edg dilitation  [id number]  . Lymph node removal  2011    right groin  . Bone marrow biopsy      2011-at [facility], 01/2010-at [facility], 06/12-[facility]  . Lymph node biopsy  03/15/2011    Procedure: LYMPH NODE BIOPSY;  Surgeon: Earnstine Regal, MD;  Location: [facility] ORS;  Service: General;  Laterality: Right;  Right Anterior Cervical Lymph Node Excisional Biopsy   . 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 Cataract Bilateral    Family History  Problem Relation Age of Onset  . Heart failure Father   . Hypertension Father   . Hearing loss Father 70    congestive heart failure  . Diabetes Father   . Hypertension Mother   . Stroke Mother 12   History  Substance Use Topics  . Smoking status: Never Smoker   . Smokeless tobacco: Never Used  . Alcohol Use: No    Review  of Systems  Constitutional: Positive for diaphoresis.  Respiratory: Negative for cough and shortness of breath.   Cardiovascular: Negative for chest pain.  Gastrointestinal: Positive for nausea, vomiting and diarrhea.  Neurological: Positive for dizziness.      Allergies  Cefdinir; Adhesive; Lortab; Niaspan; Clindamycin; and Codeine  Home Medications   Prior to Admission medications   Medication Sig Start Date End Date Taking? Authorizing Provider  acetaminophen (TYLENOL ARTHRITIS PAIN) 650 MG CR tablet Take 650 mg by mouth as directed. Takes 650 mg tid-qid    Historical Provider, MD  cholestyramine Lucrezia Starch) 4 G packet Take 4 g by mouth daily.  03/18/13   Historical Provider, MD  dicyclomine (BENTYL) 20 MG tablet Take 20 mg by mouth 4 (four) times daily -  before meals and at bedtime.  12/11/12   Historical Provider, MD  insulin regular (NOVOLIN R,HUMULIN R) 100 units/mL injection Inject 6 Units into the skin as needed. For glucose  greater than 175    Historical Provider, MD  levothyroxine (SYNTHROID, LEVOTHROID) 100 MCG tablet Take 100 mcg by mouth daily.    Historical Provider, MD  nitroGLYCERIN (NITROSTAT) 0.4 MG SL tablet PLACE 1 TABLET UNDER THE TONGUE EVERY 5 MINUTES AS NEEDED FOR CHEST PAIN Patient not taking: Reported on 08/18/2014 07/11/14   Thayer Headings, MD  olmesartan-hydrochlorothiazide (BENICAR HCT) 40-25 MG per tablet Take 0.5 tablets by mouth every morning.     Historical Provider, MD  PATADAY 0.2 % SOLN Apply 1 drop to eye every morning. To both eyes 12/30/13   Historical Provider, MD  Polyethyl Glycol-Propyl Glycol (SYSTANE ULTRA OP) Apply 1-2 drops to eye QID.     Historical Provider, MD  Polyethyl Glycol-Propyl Glycol (SYSTANE) 0.4-0.3 % GEL Apply 1 Dose to eye at bedtime.    Historical Provider, MD  predniSONE (DELTASONE) 10 MG tablet  07/25/14   Historical Provider, MD  zolpidem (AMBIEN) 10 MG tablet Take 10 mg by mouth at bedtime.     Historical Provider, MD   BP 161/83 mmHg  Pulse 63  Temp(Src) 97.6 F (36.4 C) (Oral)  Resp 26  SpO2 97% Physical Exam  Constitutional: He is oriented to person, place, and time. He appears well-developed and well-nourished. He appears distressed.  HENT:  Head: Normocephalic and atraumatic.  Right Ear: External ear normal.  Left Ear: External ear normal.  Nose: Nose normal.  Mouth/Throat: Oropharynx is clear and moist.  Eyes: Conjunctivae and EOM are normal. Pupils are equal, round, and reactive to light.  Neck: Normal range of motion. Neck supple. No JVD present. No tracheal deviation present. No thyromegaly present.  Cardiovascular: Normal rate, regular rhythm, normal heart sounds and intact distal pulses.  Exam reveals no gallop and no friction rub.   No murmur heard. Pulmonary/Chest: Effort normal and breath sounds normal. No stridor. No respiratory distress. He has no wheezes. He has no rales. He exhibits no tenderness.  Abdominal: Soft. Bowel sounds are  normal. He exhibits no distension and no mass. There is no tenderness. There is no rebound and no guarding.  Musculoskeletal: Normal range of motion. He exhibits no edema or tenderness.  Lymphadenopathy:    He has no cervical adenopathy.  Neurological: He is alert and oriented to person, place, and time. He displays normal reflexes. A cranial nerve deficit is present. He exhibits normal muscle tone. Coordination abnormal.  Nystagmus, left sided gaze with left eye, abn finger nose finger.  Skin: Skin is warm. No rash noted. He is diaphoretic. No erythema.  No pallor.  Psychiatric: He has a normal mood and affect. His behavior is normal. Judgment and thought content normal.  Nursing note and vitals reviewed.   ED Course  Procedures (including critical care time) DIAGNOSTIC STUDIES: Oxygen Saturation is 97% on room air, normal by my interpretation.    COORDINATION OF CARE: 11:30 PM Discussed treatment plan with patient at beside, the patient agrees with the plan and has no further questions at this time.   Labs Review Labs Reviewed  CBC - Abnormal; Notable for the following:    WBC 11.0 (*)    RDW 16.0 (*)    Platelets 107 (*)    All other components within normal limits  DIFFERENTIAL - Abnormal; Notable for the following:    Monocytes Relative 18 (*)    Monocytes Absolute 1.9 (*)    All other components within normal limits  COMPREHENSIVE METABOLIC PANEL - Abnormal; Notable for the following:    Glucose, Bld 205 (*)    All other components within normal limits  URINALYSIS, ROUTINE W REFLEX MICROSCOPIC (NOT AT [facility]) - Abnormal; Notable for the following:    Bilirubin Urine LARGE (*)    All other components within normal limits  CORTISOL-AM, BLOOD - Abnormal; Notable for the following:    Cortisol - AM 63.1 (*)    All other components within normal limits  LIPID PANEL - Abnormal; Notable for the following:    HDL 19 (*)    All other components within normal limits  GLUCOSE,  CAPILLARY - Abnormal; Notable for the following:    Glucose-Capillary 137 (*)    All other components within normal limits  I-STAT CHEM 8, ED - Abnormal; Notable for the following:    Glucose, Bld 207 (*)    All other components within normal limits  CLOSTRIDIUM DIFFICILE BY PCR (NOT AT [facility])  ETHANOL  PROTIME-INR  APTT  URINE RAPID DRUG SCREEN, HOSP PERFORMED  HEMOGLOBIN A1C  I-STAT TROPOININ, ED    Imaging Review Ct Head Wo Contrast  10/27/2014   CLINICAL DATA:  74 year old male with asymmetric pupils, and concern for stroke  EXAM: CT HEAD WITHOUT CONTRAST  TECHNIQUE: Contiguous axial images were obtained from the base of the skull through the vertex without intravenous contrast.  COMPARISON:  None.  FINDINGS: There is slight prominence of the ventricles and sulci compatible with age-related volume loss. Mild periventricular and deep white matter hypodensities represent chronic microvascular ischemic changes. There is no intracranial hemorrhage. No mass effect or midline shift identified.  There is mild mucoperiosteal thickening of the paranasal sinuses with partial opacification of the maxillary sinuses. The visualized mastoid air cells are clear. The calvarium is intact.  IMPRESSION: No acute intracranial pathology.  Mild age-related atrophy and chronic microvascular ischemic disease.  If symptoms persist and there are no contraindications, MRI may provide better evaluation if clinically indicated  Critical Value/emergent results were called by telephone at the time of interpretation on 10/27/2014 at 11:51 pm to nurse Owens Shark, who verbally acknowledged these results.   Electronically Signed   By: Anner Crete M.D.   On: 10/27/2014 23:51   Mr Jodene Nam Head Wo Contrast  10/28/2014   CLINICAL DATA:  Initial evaluation for acute vertigo, evaluate for post ventilation stroke  EXAM: MRI HEAD WITHOUT CONTRAST  MRA HEAD WITHOUT CONTRAST  TECHNIQUE: Multiplanar, multiecho pulse sequences of the brain and  surrounding structures were obtained without intravenous contrast. Angiographic images of the head were obtained using MRA technique without contrast.  COMPARISON:  Prior CT from 10/27/2014  FINDINGS: MRI HEAD FINDINGS  Study is moderately degraded by motion artifact.  Moderate diffuse prominence of the CSF containing spaces is compatible with generalized age-related cerebral atrophy. Mild chronic small vessel ischemic type changes present within the periventricular white matter.  No abnormal foci of restricted diffusion to suggest acute intracranial infarct. Gray-white matter differentiation maintained. Normal intravascular flow voids are preserved. No acute or chronic intracranial hemorrhage.  No mass lesion, midline shift, or mass effect. No hydrocephalus. No extra-axial fluid collection.  Craniocervical junction within normal limits. Pituitary gland normal. No acute abnormality about the orbits. Sequelae of prior bilateral lens extraction noted.  Mild mucosal thickening present within the maxillary sinuses and ethmoidal air cells. No air-fluid level to suggest active sinus infection. Minimal scattered opacity present within the left mastoid air cells. Right mastoid air cells are clear. Inner ear structures grossly normal.  Bone marrow signal intensity within normal limits. Scalp soft tissues unremarkable.  MRA HEAD FINDINGS  ANTERIOR CIRCULATION:  Study is moderately degraded by motion artifact.  Visualized distal cervical segments of the internal carotid arteries are patent with antegrade flow. The petrous, cavernous, and supra clinoid segments are well opacified bilaterally. A1 segments, anterior communicating artery, and anterior cerebral arteries patent bilaterally. M1 segments widely patent without focal stenosis or occlusion. MCA bifurcations grossly normal. A para focal outpouching arising from the inferior aspect of the right MCA bifurcation favored to reflect a normal infundibulum. Distal MCA branches  not well evaluated on this exam due to motion artifact, but are grossly symmetric bilaterally.  POSTERIOR CIRCULATION:  Vertebral arteries are patent to the vertebrobasilar junction. Evaluation of the vertebral arteries and cells fairly limited due to motion artifact. Posterior inferior cerebral arteries not well seen. Basilar artery appears to be mildly tortuous but patent to its distal aspect. Superior cerebellar arteries are patent proximally. Both posterior cerebral arteries arise from the basilar artery and appear to be patent to their distal aspects. A right posterior communicating artery with smaller left posterior communicating artery is present.  There is a 4-5 mm focal outpouching arising from the distal cavernous/supra clinoid right ICA near the expected takeoff of the right ophthalmic artery. Finding is suspicious for a possible small aneurysm (series 6, image 87). This is directed anteriorly and medially. No other definite aneurysm or vascular malformation identified on this motion degraded study.  IMPRESSION: MRI HEAD IMPRESSION:  1. No acute intracranial infarct or other abnormality identified. 2. Moderate generalized cerebral atrophy with mild chronic small vessel ischemic disease.  MRA HEAD IMPRESSION:  1. No large vessel occlusion or hemodynamically significant stenosis identified within the intracranial circulation. Please note that this study is moderately degraded by motion artifact. 2. 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 A short interval follow-up study is suggested to evaluate the stability of this finding.   Electronically Signed   By: Jeannine Boga M.D.   On: 10/28/2014 04:54   Mr Brain Wo Contrast  10/28/2014   CLINICAL DATA:  Initial evaluation for acute vertigo, evaluate for post ventilation stroke  EXAM: MRI HEAD WITHOUT CONTRAST  MRA HEAD WITHOUT CONTRAST  TECHNIQUE:  Multiplanar, multiecho pulse sequences of the brain and surrounding structures were obtained without intravenous contrast. Angiographic images of the head were obtained using MRA technique without contrast.  COMPARISON:  Prior CT from 10/27/2014  FINDINGS: MRI HEAD FINDINGS  Study is moderately degraded by motion artifact.  Moderate diffuse prominence of the CSF containing spaces is compatible with generalized age-related cerebral atrophy. Mild chronic small vessel ischemic type changes present within the periventricular white matter.  No abnormal foci of restricted diffusion to suggest acute intracranial infarct. Gray-white matter differentiation maintained. Normal intravascular flow voids are preserved. No acute or chronic intracranial hemorrhage.  No mass lesion, midline shift, or mass effect. No hydrocephalus. No extra-axial fluid collection.  Craniocervical junction within normal limits. Pituitary gland normal. No acute abnormality about the orbits. Sequelae of prior bilateral lens extraction noted.  Mild mucosal thickening present within the maxillary sinuses and ethmoidal air cells. No air-fluid level to suggest active sinus infection. Minimal scattered opacity present within the left mastoid air cells. Right mastoid air cells are clear. Inner ear structures grossly normal.  Bone marrow signal intensity within normal limits. Scalp soft tissues unremarkable.  MRA HEAD FINDINGS  ANTERIOR CIRCULATION:  Study is moderately degraded by motion artifact.  Visualized distal cervical segments of the internal carotid arteries are patent with antegrade flow. The petrous, cavernous, and supra clinoid segments are well opacified bilaterally. A1 segments, anterior communicating artery, and anterior cerebral arteries patent bilaterally. M1 segments widely patent without focal stenosis or occlusion. MCA bifurcations grossly normal. A para focal outpouching arising from the inferior aspect of the right MCA bifurcation favored  to reflect a normal infundibulum. Distal MCA branches not well evaluated on this exam due to motion artifact, but are grossly symmetric bilaterally.  POSTERIOR CIRCULATION:  Vertebral arteries are patent to the vertebrobasilar junction. Evaluation of the vertebral arteries and cells fairly limited due to motion artifact. Posterior inferior cerebral arteries not well seen. Basilar artery appears to be mildly tortuous but patent to its distal aspect. Superior cerebellar arteries are patent proximally. Both posterior cerebral arteries arise from the basilar artery and appear to be patent to their distal aspects. A right posterior communicating artery with smaller left posterior communicating artery is present.  There is a 4-5 mm focal outpouching arising from the distal cavernous/supra clinoid right ICA near the expected takeoff of the right ophthalmic artery. Finding is suspicious for a possible small aneurysm (series 6, image 87). This is directed anteriorly and medially. No other definite aneurysm or vascular malformation identified on this motion degraded study.  IMPRESSION: MRI HEAD IMPRESSION:  1. No acute intracranial infarct or other abnormality identified. 2. Moderate generalized cerebral atrophy with mild chronic small vessel ischemic disease.  MRA HEAD IMPRESSION:  1. No large vessel occlusion or hemodynamically significant stenosis identified within the intracranial circulation. Please note that this study is moderately degraded by motion artifact. 2. 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 A short interval follow-up study is suggested to evaluate the stability of this finding.   Electronically Signed   By: Jeannine Boga M.D.   On: 10/28/2014 04:54   Dg Chest Port 1 View  10/28/2014   CLINICAL DATA:  74 year old male with hypoxia  EXAM: PORTABLE CHEST - 1 VIEW  COMPARISON:  Chest radiograph dated  06/12/2012  FINDINGS: Single-view of the chest demonstrate hypovolemic lungs with bibasilar subsegmental linear atelectasis. No focal consolidation. Stable cardiac silhouette. The osseous structures are grossly unremarkable.  IMPRESSION: No focal consolidation.   Electronically Signed   By: Anner Crete M.D.   On: 10/28/2014 00:28     EKG Interpretation   Date/Time:  Monday October 27 2014 23:30:26 EDT Ventricular Rate:  61 PR Interval:  159 QRS Duration: 98 QT Interval:  445 QTC Calculation: 448 R Axis:   4 Text Interpretation:  Sinus rhythm Low voltage, precordial leads t wave  inversion in III has resolved Confirmed by Christyana Corwin  MD, Merit Maybee ([id number]) on  10/27/2014 11:43:43 PM      MDM   Final diagnoses:  Hypoxia  Vertigo  Stroke   I personally performed the services described in this documentation, which was scribed in my presence. The recorded information has been reviewed and is accurate.  74 yo male with acute onset of vertigo, nystagmus, n/v tonight.  Concern for posterior circulation stroke.  Dr Aram Beecham at bedside with code stroke activation.  Pt does not meet criteria for TPA given NIH of 1.  To be admitted for further stroke w/u.  Pt and wife updated on findings and plan.   Linton Flemings, MD 10/28/14 [phone]

## 2014-10-27 NOTE — ED Notes (Signed)
Pt back to room from CT scan. Neuro team at bedside and lab at bedside.

## 2014-10-27 NOTE — ED Notes (Signed)
Pt states that after eating dinner this evening, he became nauseated and dizzy. Pt denies any chest pain or shortness of breath. Pt was given 324 of ASA and 4mg  of Zorfran by EMS enroute to the ED.

## 2014-10-27 NOTE — ED Notes (Signed)
[facility] called/activate Code [place] @2331 .

## 2014-10-28 ENCOUNTER — Inpatient Hospital Stay (HOSPITAL_COMMUNITY): Payer: Medicare Other

## 2014-10-28 ENCOUNTER — Ambulatory Visit (HOSPITAL_COMMUNITY): Payer: Medicare Other

## 2014-10-28 ENCOUNTER — Inpatient Hospital Stay (HOSPITAL_COMMUNITY): Payer: BLUE CROSS/BLUE SHIELD

## 2014-10-28 ENCOUNTER — Encounter (HOSPITAL_COMMUNITY): Payer: Self-pay | Admitting: Internal Medicine

## 2014-10-28 DIAGNOSIS — Z7952 Long term (current) use of systemic steroids: Secondary | ICD-10-CM | POA: Diagnosis not present

## 2014-10-28 DIAGNOSIS — E785 Hyperlipidemia, unspecified: Secondary | ICD-10-CM | POA: Diagnosis present

## 2014-10-28 DIAGNOSIS — J9811 Atelectasis: Secondary | ICD-10-CM | POA: Diagnosis not present

## 2014-10-28 DIAGNOSIS — R42 Dizziness and giddiness: Secondary | ICD-10-CM

## 2014-10-28 DIAGNOSIS — I10 Essential (primary) hypertension: Secondary | ICD-10-CM | POA: Diagnosis not present

## 2014-10-28 DIAGNOSIS — M353 Polymyalgia rheumatica: Secondary | ICD-10-CM | POA: Diagnosis present

## 2014-10-28 DIAGNOSIS — Z79899 Other long term (current) drug therapy: Secondary | ICD-10-CM | POA: Diagnosis not present

## 2014-10-28 DIAGNOSIS — D696 Thrombocytopenia, unspecified: Secondary | ICD-10-CM | POA: Diagnosis present

## 2014-10-28 DIAGNOSIS — Z794 Long term (current) use of insulin: Secondary | ICD-10-CM | POA: Diagnosis not present

## 2014-10-28 DIAGNOSIS — R0902 Hypoxemia: Secondary | ICD-10-CM | POA: Diagnosis not present

## 2014-10-28 DIAGNOSIS — K219 Gastro-esophageal reflux disease without esophagitis: Secondary | ICD-10-CM | POA: Diagnosis not present

## 2014-10-28 DIAGNOSIS — E119 Type 2 diabetes mellitus without complications: Secondary | ICD-10-CM | POA: Diagnosis not present

## 2014-10-28 DIAGNOSIS — I2510 Atherosclerotic heart disease of native coronary artery without angina pectoris: Secondary | ICD-10-CM | POA: Diagnosis not present

## 2014-10-28 DIAGNOSIS — E039 Hypothyroidism, unspecified: Secondary | ICD-10-CM

## 2014-10-28 DIAGNOSIS — I639 Cerebral infarction, unspecified: Secondary | ICD-10-CM | POA: Diagnosis not present

## 2014-10-28 DIAGNOSIS — Z885 Allergy status to narcotic agent status: Secondary | ICD-10-CM | POA: Diagnosis not present

## 2014-10-28 DIAGNOSIS — K580 Irritable bowel syndrome with diarrhea: Secondary | ICD-10-CM | POA: Diagnosis present

## 2014-10-28 DIAGNOSIS — Z881 Allergy status to other antibiotic agents status: Secondary | ICD-10-CM | POA: Diagnosis not present

## 2014-10-28 DIAGNOSIS — T39395A Adverse effect of other nonsteroidal anti-inflammatory drugs [NSAID], initial encounter: Secondary | ICD-10-CM | POA: Diagnosis present

## 2014-10-28 DIAGNOSIS — M4312 Spondylolisthesis, cervical region: Secondary | ICD-10-CM | POA: Diagnosis not present

## 2014-10-28 DIAGNOSIS — M5032 Other cervical disc degeneration, mid-cervical region: Secondary | ICD-10-CM | POA: Diagnosis not present

## 2014-10-28 DIAGNOSIS — M9971 Connective tissue and disc stenosis of intervertebral foramina of cervical region: Secondary | ICD-10-CM | POA: Diagnosis not present

## 2014-10-28 DIAGNOSIS — Z91048 Other nonmedicinal substance allergy status: Secondary | ICD-10-CM | POA: Diagnosis not present

## 2014-10-28 DIAGNOSIS — Z791 Long term (current) use of non-steroidal anti-inflammatories (NSAID): Secondary | ICD-10-CM | POA: Diagnosis not present

## 2014-10-28 DIAGNOSIS — E669 Obesity, unspecified: Secondary | ICD-10-CM | POA: Diagnosis present

## 2014-10-28 DIAGNOSIS — Z955 Presence of coronary angioplasty implant and graft: Secondary | ICD-10-CM | POA: Diagnosis not present

## 2014-10-28 DIAGNOSIS — R197 Diarrhea, unspecified: Secondary | ICD-10-CM | POA: Diagnosis present

## 2014-10-28 DIAGNOSIS — G311 Senile degeneration of brain, not elsewhere classified: Secondary | ICD-10-CM | POA: Diagnosis not present

## 2014-10-28 DIAGNOSIS — Z8719 Personal history of other diseases of the digestive system: Secondary | ICD-10-CM

## 2014-10-28 DIAGNOSIS — M47816 Spondylosis without myelopathy or radiculopathy, lumbar region: Secondary | ICD-10-CM | POA: Diagnosis not present

## 2014-10-28 LAB — DIFFERENTIAL
BASOS PCT: 0 % (ref 0–1)
Basophils Absolute: 0 10*3/uL (ref 0.0–0.1)
EOS ABS: 0.1 10*3/uL (ref 0.0–0.7)
EOS PCT: 1 % (ref 0–5)
Lymphocytes Relative: 21 % (ref 12–46)
Lymphs Abs: 2.3 10*3/uL (ref 0.7–4.0)
MONOS PCT: 18 % — AB (ref 3–12)
Monocytes Absolute: 1.9 10*3/uL — ABNORMAL HIGH (ref 0.1–1.0)
NEUTROS PCT: 61 % (ref 43–77)
Neutro Abs: 6.8 10*3/uL (ref 1.7–7.7)

## 2014-10-28 LAB — RAPID URINE DRUG SCREEN, HOSP PERFORMED
AMPHETAMINES: NOT DETECTED
Barbiturates: NOT DETECTED
Benzodiazepines: NOT DETECTED
COCAINE: NOT DETECTED
Opiates: NOT DETECTED
Tetrahydrocannabinol: NOT DETECTED

## 2014-10-28 LAB — PROTIME-INR
INR: 1.14 (ref 0.00–1.49)
Prothrombin Time: 14.8 seconds (ref 11.6–15.2)

## 2014-10-28 LAB — CBC
HCT: 40.8 % (ref 39.0–52.0)
Hemoglobin: 13.3 g/dL (ref 13.0–17.0)
MCH: 29.6 pg (ref 26.0–34.0)
MCHC: 32.6 g/dL (ref 30.0–36.0)
MCV: 90.9 fL (ref 78.0–100.0)
Platelets: 107 10*3/uL — ABNORMAL LOW (ref 150–400)
RBC: 4.49 MIL/uL (ref 4.22–5.81)
RDW: 16 % — ABNORMAL HIGH (ref 11.5–15.5)
WBC: 11 10*3/uL — ABNORMAL HIGH (ref 4.0–10.5)

## 2014-10-28 LAB — COMPREHENSIVE METABOLIC PANEL
ALT: 31 U/L (ref 17–63)
AST: 22 U/L (ref 15–41)
Albumin: 3.9 g/dL (ref 3.5–5.0)
Alkaline Phosphatase: 100 U/L (ref 38–126)
Anion gap: 10 (ref 5–15)
BILIRUBIN TOTAL: 0.6 mg/dL (ref 0.3–1.2)
BUN: 18 mg/dL (ref 6–20)
CALCIUM: 9 mg/dL (ref 8.9–10.3)
CO2: 25 mmol/L (ref 22–32)
CREATININE: 0.71 mg/dL (ref 0.61–1.24)
Chloride: 103 mmol/L (ref 101–111)
GFR calc Af Amer: >60 mL/min (ref >60)
GFR calc non Af Amer: >60 mL/min (ref >60)
Glucose, Bld: 205 mg/dL — ABNORMAL HIGH (ref 65–99)
Potassium: 3.9 mmol/L (ref 3.5–5.1)
Sodium: 138 mmol/L (ref 135–145)
Total Protein: 7.2 g/dL (ref 6.5–8.1)

## 2014-10-28 LAB — URINALYSIS, ROUTINE W REFLEX MICROSCOPIC
GLUCOSE, UA: NEGATIVE mg/dL
Hgb urine dipstick: NEGATIVE
Ketones, ur: NEGATIVE mg/dL
Leukocytes, UA: NEGATIVE
NITRITE: NEGATIVE
Protein, ur: NEGATIVE mg/dL
Specific Gravity, Urine: 1.027 (ref 1.005–1.030)
Urobilinogen, UA: 0.2 mg/dL (ref 0.0–1.0)
pH: 5 (ref 5.0–8.0)

## 2014-10-28 LAB — LIPID PANEL
Cholesterol: 94 mg/dL (ref 0–200)
HDL: 19 mg/dL — AB (ref >40)
LDL Cholesterol: 57 mg/dL (ref 0–99)
Total CHOL/HDL Ratio: 4.9 RATIO
Triglycerides: 91 mg/dL (ref <150)
VLDL: 18 mg/dL (ref 0–40)

## 2014-10-28 LAB — GLUCOSE, CAPILLARY: GLUCOSE-CAPILLARY: 137 mg/dL — AB (ref 65–99)

## 2014-10-28 LAB — CORTISOL-AM, BLOOD: CORTISOL - AM: 63.1 ug/dL — AB (ref 6.7–22.6)

## 2014-10-28 LAB — ETHANOL

## 2014-10-28 LAB — APTT: aPTT: 35 seconds (ref 24–37)

## 2014-10-28 MED ORDER — HYPROMELLOSE (GONIOSCOPIC) 2.5 % OP SOLN
1.0000 [drp] | Freq: Every day | OPHTHALMIC | Status: DC
  Administered 2014-10-28: 1 [drp] via OPHTHALMIC
  Filled 2014-10-28: qty 15

## 2014-10-28 MED ORDER — MECLIZINE HCL 12.5 MG PO TABS: 25.0000 mg | ORAL_TABLET | Freq: Three times a day (TID) | ORAL | Status: DC | PRN

## 2014-10-28 MED ORDER — LORAZEPAM 2 MG/ML IJ SOLN
INTRAMUSCULAR | Status: AC
  Administered 2014-10-28: 1 mg via INTRAVENOUS
  Filled 2014-10-28: qty 1

## 2014-10-28 MED ORDER — LEVOTHYROXINE SODIUM 100 MCG PO TABS
100.0000 ug | ORAL_TABLET | Freq: Every day | ORAL | Status: DC
  Administered 2014-10-28 – 2014-10-29 (×2): 100 ug via ORAL
  Filled 2014-10-28 (×2): qty 1

## 2014-10-28 MED ORDER — LORAZEPAM 2 MG/ML IJ SOLN
1.0000 mg | Freq: Once | INTRAMUSCULAR | Status: AC
  Administered 2014-10-28: 1 mg via INTRAVENOUS
  Filled 2014-10-28: qty 1

## 2014-10-28 MED ORDER — CALCIUM CARBONATE 1250 (500 CA) MG PO TABS
1250.0000 mg | ORAL_TABLET | Freq: Every day | ORAL | Status: DC
  Administered 2014-10-28 – 2014-10-29 (×2): 1250 mg via ORAL
  Filled 2014-10-28 (×2): qty 3

## 2014-10-28 MED ORDER — ONDANSETRON HCL 4 MG/2ML IJ SOLN: 4.0000 mg | Freq: Three times a day (TID) | INTRAMUSCULAR | Status: DC | PRN

## 2014-10-28 MED ORDER — DICYCLOMINE HCL 20 MG PO TABS
20.0000 mg | ORAL_TABLET | Freq: Three times a day (TID) | ORAL | Status: DC
  Administered 2014-10-28 – 2014-10-29 (×5): 20 mg via ORAL
  Filled 2014-10-28 (×5): qty 1

## 2014-10-28 MED ORDER — LORAZEPAM 2 MG/ML IJ SOLN
0.5000 mg | Freq: Once | INTRAMUSCULAR | Status: AC
  Administered 2014-10-28: 0.5 mg via INTRAVENOUS
  Filled 2014-10-28: qty 1

## 2014-10-28 MED ORDER — NITROGLYCERIN 0.4 MG SL SUBL: 0.4000 mg | SUBLINGUAL_TABLET | SUBLINGUAL | Status: DC | PRN

## 2014-10-28 MED ORDER — SODIUM CHLORIDE 0.9 % IV SOLN
INTRAVENOUS | Status: DC
  Administered 2014-10-28 – 2014-10-29 (×3): via INTRAVENOUS

## 2014-10-28 MED ORDER — STROKE: EARLY STAGES OF RECOVERY BOOK
Freq: Once | Status: AC
  Administered 2014-10-28: 06:00:00
  Filled 2014-10-28: qty 1

## 2014-10-28 MED ORDER — ATORVASTATIN CALCIUM 40 MG PO TABS
40.0000 mg | ORAL_TABLET | Freq: Every day | ORAL | Status: DC
  Administered 2014-10-28: 40 mg via ORAL
  Filled 2014-10-28: qty 1

## 2014-10-28 MED ORDER — ZOLPIDEM TARTRATE 5 MG PO TABS
5.0000 mg | ORAL_TABLET | Freq: Every day | ORAL | Status: DC
  Administered 2014-10-28: 5 mg via ORAL
  Filled 2014-10-28: qty 1

## 2014-10-28 MED ORDER — LORAZEPAM 2 MG/ML IJ SOLN
1.0000 mg | Freq: Once | INTRAMUSCULAR | Status: AC
  Administered 2014-10-28: 1 mg via INTRAVENOUS

## 2014-10-28 MED ORDER — VITAMIN D3 25 MCG (1000 UNIT) PO TABS
1000.0000 [IU] | ORAL_TABLET | Freq: Every day | ORAL | Status: DC
  Administered 2014-10-28 – 2014-10-29 (×2): 1000 [IU] via ORAL
  Filled 2014-10-28 (×4): qty 1

## 2014-10-28 MED ORDER — SENNOSIDES-DOCUSATE SODIUM 8.6-50 MG PO TABS: 1.0000 | ORAL_TABLET | Freq: Every evening | ORAL | Status: DC | PRN

## 2014-10-28 MED ORDER — HEPARIN SODIUM (PORCINE) 5000 UNIT/ML IJ SOLN
5000.0000 [IU] | Freq: Three times a day (TID) | INTRAMUSCULAR | Status: DC
  Administered 2014-10-28 – 2014-10-29 (×5): 5000 [IU] via SUBCUTANEOUS
  Filled 2014-10-28 (×5): qty 1

## 2014-10-28 MED ORDER — ASPIRIN EC 81 MG PO TBEC
81.0000 mg | DELAYED_RELEASE_TABLET | Freq: Every day | ORAL | Status: DC
  Administered 2014-10-28 – 2014-10-29 (×2): 81 mg via ORAL
  Filled 2014-10-28 (×2): qty 1

## 2014-10-28 MED ORDER — CHOLESTYRAMINE 4 G PO PACK
4.0000 g | PACK | Freq: Every day | ORAL | Status: DC
  Administered 2014-10-28 – 2014-10-29 (×2): 4 g via ORAL
  Filled 2014-10-28 (×2): qty 1

## 2014-10-28 MED ORDER — PREDNISONE 5 MG PO TABS
10.0000 mg | ORAL_TABLET | Freq: Every day | ORAL | Status: DC
  Administered 2014-10-28 – 2014-10-29 (×2): 10 mg via ORAL
  Filled 2014-10-28 (×2): qty 2

## 2014-10-28 MED ORDER — POLYVINYL ALCOHOL 1.4 % OP SOLN
1.0000 [drp] | Freq: Four times a day (QID) | OPHTHALMIC | Status: DC | PRN
  Filled 2014-10-28: qty 15

## 2014-10-28 MED ORDER — HYDROCORTISONE NA SUCCINATE PF 100 MG IJ SOLR
50.0000 mg | Freq: Once | INTRAMUSCULAR | Status: AC
  Administered 2014-10-28: 50 mg via INTRAVENOUS
  Filled 2014-10-28: qty 2

## 2014-10-28 MED ORDER — HYDRALAZINE HCL 20 MG/ML IJ SOLN: 5.0000 mg | INTRAMUSCULAR | Status: DC | PRN

## 2014-10-28 MED ORDER — PANTOPRAZOLE SODIUM 40 MG PO TBEC
40.0000 mg | DELAYED_RELEASE_TABLET | Freq: Every day | ORAL | Status: DC
  Administered 2014-10-28 – 2014-10-29 (×2): 40 mg via ORAL
  Filled 2014-10-28 (×2): qty 1

## 2014-10-28 MED ORDER — ACETAMINOPHEN 325 MG PO TABS: 650.0000 mg | ORAL_TABLET | Freq: Three times a day (TID) | ORAL | Status: DC | PRN

## 2014-10-28 NOTE — Evaluation (Signed)
Physical Therapy Evaluation Patient Details Name: Nathan Kemp MRN: [id number] DOB: Nov 27, 1940 Today's Date: 10/28/2014   History of Present Illness  Patient is a 74 y/o male with hx of HTN, HLD, DM, CAD s/p stent 2006, thrombocytopenia, vitamin D deficiency,DDD cervical spine, isolated peripheral vertigo 10 years ago, who presents with dizziness, left facial droop, nausea, diarrhea and sweating. Pt had temporary dental crown placed and took one dose of Lodine prior to onset of symptoms. Head CT (-). Workup pending. MD concerned for TIA vs acute labyrintine dysfunction.    Clinical Impression  Patient presents close to functional baseline and able to ambulate community distances without LOB or difficulty. Not able to elicit any symptoms of vertigo/dizziness with testing or during mobility today. Does not seem to be a vestibular dysfunction at this time. Will reassess tomorrow. Will plan to perform stair training next session as tolerated. Pt has support at home from wife. Will continue to follow to maximize independence and mobility prior to return home.    Follow Up Recommendations No PT follow up;Supervision - Intermittent    Equipment Recommendations  None recommended by PT    Recommendations for Other Services       Precautions / Restrictions Precautions Precautions: None Restrictions Weight Bearing Restrictions: No      Mobility  Bed Mobility Overal bed mobility: Modified Independent                Transfers Overall transfer level: Modified independent Equipment used: None                Ambulation/Gait Ambulation/Gait assistance: Supervision Ambulation Distance (Feet): 150 Feet Assistive device: None Gait Pattern/deviations: Step-through pattern;Decreased stride length   Gait velocity interpretation: <1.8 ft/sec, indicative of risk for recurrent falls General Gait Details: Slow, guarded gait (pt reports as baseline). No dizziness or vertigo noted  with head movements.   Stairs            Wheelchair Mobility    Modified Rankin (Stroke Patients Only) Modified Rankin (Stroke Patients Only) Pre-Morbid Rankin Score: No symptoms Modified Rankin: No significant disability     Balance Overall balance assessment: Needs assistance Sitting-balance support: Feet supported;No upper extremity supported Sitting balance-Leahy Scale: Good     Standing balance support: During functional activity Standing balance-Leahy Scale: Fair                               Pertinent Vitals/Pain Pain Assessment: No/denies pain    Home Living Family/patient expects to be discharged to:: Private residence Living Arrangements: Spouse/significant other Available Help at Discharge: Family Type of Home: House Home Access: Level entry     Home Layout: Two level Home Equipment: Cane - single point;Walker - 2 wheels      Prior Function Level of Independence: Independent               Hand Dominance        Extremity/Trunk Assessment   Upper Extremity Assessment: Defer to OT evaluation           Lower Extremity Assessment: Overall WFL for tasks assessed         Communication   Communication: No difficulties  Cognition Arousal/Alertness: Awake/alert Behavior During Therapy: WFL for tasks assessed/performed Overall Cognitive Status: Within Functional Limits for tasks assessed                      General Comments General comments (  skin integrity, edema, etc.): Able to visually track in all fields without nystagmus. (-) head thrust test bilaterally. No symptoms or vertigo with head movements in all directions.     Exercises        Assessment/Plan    PT Assessment Patient needs continued PT services  PT Diagnosis Difficulty walking   PT Problem List Decreased mobility;Decreased activity tolerance;Decreased balance  PT Treatment Interventions Balance training;Gait training;Functional mobility  training;Therapeutic activities;Therapeutic exercise;Patient/family education;Stair training   PT Goals (Current goals can be found in the Care Plan section) Acute Rehab PT Goals Patient Stated Goal: to return home and get back to life PT Goal Formulation: With patient Time For Goal Achievement: 11/11/14 Potential to Achieve Goals: Good    Frequency Min 3X/week   Barriers to discharge Inaccessible home environment 13 steps to get to bedroom    Co-evaluation               End of Session Equipment Utilized During Treatment: Gait belt Activity Tolerance: Patient tolerated treatment well Patient left: in bed;with call bell/phone within reach Nurse Communication: Mobility status         Time: 9924-2683 PT Time Calculation (min) (ACUTE ONLY): 22 min   Charges:   PT Evaluation $Initial PT Evaluation Tier I: 1 Procedure     PT G Codes:        Sonali Wivell A Victoriano Campion 10/28/2014, 5:00 PM Wray Kearns, [place], DPT [phone]

## 2014-10-28 NOTE — ED Notes (Signed)
Pt states he has issue with claustrophobia.

## 2014-10-28 NOTE — Progress Notes (Signed)
Bilateral carotid artery duplex completed:  1-39% ICA stenosis.  Vertebral artery flow is antegrade.     

## 2014-10-28 NOTE — ED Notes (Signed)
Hospitalist at the bedside 

## 2014-10-28 NOTE — Progress Notes (Signed)
Utilization Review Completed.Soundra Lampley T7/03/2015  

## 2014-10-28 NOTE — Progress Notes (Signed)
Code stroke called on 74 y.o male. LSN 1930. Per Pt he had sudden onset of dizziness, reports " things spinning around mainly when I try to move", double vision, and severe nausea. Denies HA, difficulty swallowing, focal weakness or numbness, slurred speech, language or vision impairment. Per Patient wife at the bedside,  when she got home she found him very sweaty, complaining of not feeling feeling, nauseated, dizzy, and she noted that the left lips were down. Pt had a temporary dental crown placed today and took one dose of Lodine as prescribed this evening around 6 PM just after to dinner, and therefore wife and patient initially thought symptoms were side effects of Lodine. Pertinent history includes HTN, hyperlipidemia, cervical DDD, borderline DM. Glucose 207. NIHSS 1 for mild visual disturbance. CT negative per Neurologist. Pt for admit to hospital for stroke work up.

## 2014-10-28 NOTE — ED Notes (Addendum)
Pt. Stats around 89-90% on 2L. Pt bumped up to 4L [place] per nurse request.  Pt resting.

## 2014-10-28 NOTE — H&P (Signed)
[facility] History and Physical  Nathan Kemp VWU:[id number] DOB: August 06, 1940 DOA: 10/27/2014  Referring physician: ED physician PCP: Geoffery Lyons, MD  Specialists:   Chief Complaint:   HPI: Nathan Kemp is a 74 y.o. male with PMH of HTN, hyperlipidemia, DM, CAD s/p stent 2006, thrombocytopenia, vitamin D deficiency, DDD cervical spine, isolated peripheral vertigo 10 years ago, who presents with dizziness, left facial droop.  Patient reports that he started having dizziness at about 7 PM. It is associated with left facial droop, nausea and sweating. No difficulty swallowing or slurred speech. No unilateral weakness, numbness or tingling sensations. Pt feels that the room is spinning around him when he opens his eyes. No ear ringing or vision loss. Pt had temporary dental crown placed today. He took one dose of Lodine which was prescribed by his dentist this evening around 6 PM, and therefore wife and patient initially thought symptoms were side effects of Lodine. Patient does not have chest pain, shortness breath, abdominal pain. No symptoms of UTI. He had 4 bowel movements with loose stool today. He also had nausea and vomited 4 times today. No abdominal pain. No recent antibodies used.  In ED, patient was found to have negative troponin, INR 1.14, PTT 35, WBC 11.0, temperature 97.5, bradycardia, electrolytes okay, negative chest x-ray, negative CT-head is negative for acute abnormalities. Patient is admitted to the patient for further evaluation and treatment.  Where does patient live?   At home   Can patient participate in ADLs?  some   Review of Systems:   General: no fevers, chills, no changes in body weight, has fatigue HEENT: has double vision, no hearing changes or sore throat Pulm: no dyspnea, coughing, wheezing CV: no chest pain, palpitations Abd: has nausea, vomiting, diarrhea, no constipation abdominal pain, GU: no dysuria, burning on urination, increased  urinary frequency, hematuria  Ext: no leg edema Neuro: no unilateral weakness, numbness, or tingling or hearing loss. Has dizziness Skin: no rash MSK: No muscle spasm, no deformity, no limitation of range of movement in spin Heme: No easy bruising.  Travel history: No recent long distant travel.  Allergy:  Allergies  Allergen Reactions  . Cefdinir Hives  . Adhesive [Tape] Other (See Comments)    blisters  . Lortab [Hydrocodone-Acetaminophen] Other (See Comments)    Makes patient very loopy. Update   Able to take now  . Niaspan [Niacin] Hives  . Clindamycin Nausea Only and Other (See Comments)    Stomach cramps  . Codeine Rash    Had rash previously but not recently-taken in cough medicine and no problems but patient can tolerate    Past Medical History  Diagnosis Date  . Anorexia   . Thrombocytopenia   . Fatigue   . Hypertension   . Hyperlipidemia   . Vitamin D deficiency   . Obesity   . DDD (degenerative disc disease), cervical   . Thyroid disease   . Trouble swallowing   . Cough   . Leg swelling     due to lymphedema  . Hypothyroidism   . Diabetes mellitus     borderline-controlled by diet  . Acute GI bleeding 04/16/2011  . Coronary artery disease     stent 06  . Heart murmur   . Asthma     when  have congstion  of chest  . Pneumonia   . Anemia   . Blood dyscrasia 06/2009    thrombocytopenia  . Blood transfusion     platelets 12/12  .  GERD (gastroesophageal reflux disease)   . 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 ED (erectile dysfunction)   . Polymyalgia   . IBS (irritable bowel syndrome)   . Essential hypertension     Past Surgical History  Procedure Laterality Date  . Cardiac catheterization  01/31/2005    EF 60-65%  . Coronary angioplasty with stent placement  2006    STENTING OF HIS CORONARY ARTERY  . Appendectomy  1967  . US echocardiography  02/28/2005    EF 55-60%  . Tonsillectomy and adenoidectomy    . Nasal sinus surgery  2005  .  Edg dilitation  [id number]  . Lymph node removal  2011    right groin  . Bone marrow biopsy      2011-at [facility], 01/2010-at [place], 06/12-[facility]  . Lymph node biopsy  03/15/2011    Procedure: LYMPH NODE BIOPSY;  Surgeon: Earnstine Regal, MD;  Location: [facility] ORS;  Service: General;  Laterality: Right;  Right Anterior Cervical Lymph Node Excisional Biopsy   . 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 Cataract Bilateral     Social History:  reports that he has never smoked. He has never used smokeless tobacco. He reports that he does not drink alcohol or use illicit drugs.  Family History:  Family History  Problem Relation Age of Onset  . Heart failure Father   . Hypertension Father   . Hearing loss Father 39    congestive heart failure  . Diabetes Father   . Hypertension Mother   . Stroke Mother 20     Prior to Admission medications   Medication Sig Start Date End Date Taking? Authorizing Provider  acetaminophen (TYLENOL ARTHRITIS PAIN) 650 MG CR tablet Take 650 mg by mouth 3 (three) times daily as needed for pain. Takes 650 mg tid-qid   Yes Historical Provider, MD  CALCIUM PO Take 330 mg by mouth daily.   Yes Historical Provider, MD  Cholecalciferol (VITAMIN D-3 PO) Take 1 tablet by mouth daily.   Yes Historical Provider, MD  cholestyramine Lucrezia Starch) 4 G packet Take 4 g by mouth daily.  03/18/13  Yes Historical Provider, MD  dicyclomine (BENTYL) 20 MG tablet Take 20 mg by mouth 3 (three) times daily.  12/11/12  Yes Historical Provider, MD  insulin regular (NOVOLIN R,HUMULIN R) 100 units/mL injection Inject 6 Units into the skin daily as needed for high blood sugar. For glucose greater than 175   Yes Historical Provider, MD  levothyroxine (SYNTHROID, LEVOTHROID) 100 MCG tablet Take 100 mcg by mouth daily.   Yes Historical Provider, MD  nitroGLYCERIN (NITROSTAT) 0.4 MG SL tablet PLACE 1 TABLET UNDER THE TONGUE EVERY 5 MINUTES AS NEEDED  FOR CHEST PAIN 07/11/14  Yes Thayer Headings, MD  olmesartan-hydrochlorothiazide (BENICAR HCT) 40-25 MG per tablet Take 0.5 tablets by mouth every morning.    Yes Historical Provider, MD  Polyethyl Glycol-Propyl Glycol (SYSTANE ULTRA OP) Place 1-2 drops into both eyes 4 (four) times daily as needed (dry eyes).    Yes Historical Provider, MD  Polyethyl Glycol-Propyl Glycol (SYSTANE) 0.4-0.3 % GEL Apply 1 Dose to eye at bedtime.   Yes Historical Provider, MD  predniSONE (DELTASONE) 10 MG tablet Take 10 mg by mouth daily with breakfast.  07/25/14  Yes Historical Provider, MD  zolpidem (AMBIEN) 10 MG tablet Take 10 mg by mouth at bedtime.  Yes Historical Provider, MD    Physical Exam: Filed Vitals:   10/28/14 0200 10/28/14 0215 10/28/14 0230 10/28/14 0245  BP: 150/79 125/73 117/68 112/65  Pulse: 58 59 62 65  Temp:      TempSrc:      Resp: _0 SpO2: 91% 90% 92% 92%   General: Not in acute distress HEENT:       Eyes: PERRL, EOMI, no scleral icterus.       ENT: No discharge from the ears and nose, no pharynx injection, no tonsillar enlargement.        Neck: No JVD, no bruit, no mass felt. Heme: No neck lymph node enlargement. Cardiac: S1/S2, RRR, No murmurs, No gallops or rubs. Pulm: No rales, wheezing, rhonchi or rubs. Abd: Soft, nondistended, nontender, no rebound pain, no organomegaly, BS present. Ext: No pitting leg edema bilaterally. 2+DP/PT pulse bilaterally. Musculoskeletal: No joint deformities, No joint redness or warmth, no limitation of ROM in spin. Skin: No rashes.  Neuro: Alert, oriented X3, cranial nerves II-XII grossly intact except for mild left facial droop, muscle strength 5/5 in all extremities, sensation to light touch intact. Brachial reflex 2+ bilaterally. Knee reflex 1+ bilaterally. Negative Babinski's sign. Normal finger to nose test. Psych: Patient is not psychotic, no suicidal or hemocidal ideation.  Labs on Admission:  Basic Metabolic Panel:  Recent  Labs Lab 10/27/14 2344 10/27/14 2350  NA 138 141  K 3.9 3.9  CL 103 103  CO2 25  --   GLUCOSE 205* 207*  BUN 18 20  CREATININE 0.71 0.70  CALCIUM 9.0  --    Liver Function Tests:  Recent Labs Lab 10/27/14 2344  AST 22  ALT 31  ALKPHOS 100  BILITOT 0.6  PROT 7.2  ALBUMIN 3.9   No results for input(s): LIPASE, AMYLASE in the last 168 hours. No results for input(s): AMMONIA in the last 168 hours. CBC:  Recent Labs Lab 10/27/14 2344 10/27/14 2350  WBC 11.0*  --   NEUTROABS 6.8  --   HGB 13.3 14.6  HCT 40.8 43.0  MCV 90.9  --   PLT 107*  --    Cardiac Enzymes: No results for input(s): CKTOTAL, CKMB, CKMBINDEX, TROPONINI in the last 168 hours.  BNP (last 3 results) No results for input(s): BNP in the last 8760 hours.  ProBNP (last 3 results) No results for input(s): PROBNP in the last 8760 hours.  CBG: No results for input(s): GLUCAP in the last 168 hours.  Radiological Exams on Admission: Ct Head Wo Contrast  10/27/2014   CLINICAL DATA:  74 year old male with asymmetric pupils, and concern for stroke  EXAM: CT HEAD WITHOUT CONTRAST  TECHNIQUE: Contiguous axial images were obtained from the base of the skull through the vertex without intravenous contrast.  COMPARISON:  None.  FINDINGS: There is slight prominence of the ventricles and sulci compatible with age-related volume loss. Mild periventricular and deep white matter hypodensities represent chronic microvascular ischemic changes. There is no intracranial hemorrhage. No mass effect or midline shift identified.  There is mild mucoperiosteal thickening of the paranasal sinuses with partial opacification of the maxillary sinuses. The visualized mastoid air cells are clear. The calvarium is intact.  IMPRESSION: No acute intracranial pathology.  Mild age-related atrophy and chronic microvascular ischemic disease.  If symptoms persist and there are no contraindications, MRI may provide better evaluation if clinically  indicated  Critical Value/emergent results were called by telephone at the time of interpretation on 10/27/2014  at 11:51 pm to nurse Owens Shark, who verbally acknowledged these results.   Electronically Signed   By: Anner Crete M.D.   On: 10/27/2014 23:51   Dg Chest Port 1 View  10/28/2014   CLINICAL DATA:  74 year old male with hypoxia  EXAM: PORTABLE CHEST - 1 VIEW  COMPARISON:  Chest radiograph dated 06/12/2012  FINDINGS: Single-view of the chest demonstrate hypovolemic lungs with bibasilar subsegmental linear atelectasis. No focal consolidation. Stable cardiac silhouette. The osseous structures are grossly unremarkable.  IMPRESSION: No focal consolidation.   Electronically Signed   By: Anner Crete M.D.   On: 10/28/2014 00:28    EKG: Independently reviewed.  Abnormal findings: Low  voltage  Assessment/Plan Principal Problem:   Stroke Active Problems:   HLD (hyperlipidemia)   Hypothyroidism   CAD (coronary artery disease)   Essential hypertension   GERD (gastroesophageal reflux disease)   Diabetes mellitus without complication   History of GI bleed   Vertigo   Diarrhea  Stroke: patient's dizziness is concerning for posterior circulation stroke. Neurology was consulted. Dr. Armida Sans saw patient, suggested to do stroke workup. Patient has h/o severe thrombocytopenia of unclear etiology, platelet is 107 today. As per wife, patient has been considered not a candidate to take antiplatelets. [place]ortuna's note on 04/18/11 also recorded gI bleed related to thrombocytopenia. Per dr. Armida Sans, if patient indeed had stroke, the secondary prevention regimen will need to be addressed with patient hematologist. I will hold ASA today.  -will admit to tele bed -Appreciate Dr. Glennon Hamilton consultation, will follow up recommendations are follows:  1. HgbA1c, fasting lipid panel 2. MRI, MRA of the brain without contrast 3. Echocardiogram 4. Carotid dopplers 5. Prophylactic therapy-antiplatelet therapy  to be discussed with patient hematologist. May consult to hematologist in the morning 6. Risk factor modification 7. Telemetry monitoring 8. Frequent neuro checks -will start lipitor -PT/OT  Thrombocytopenia: Etiology was not completely clear. Per dr. 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 Patient had nondiagnostic right inguinal lymph node biopsy and bone marrow biopsies. There was a suspicion that he may have an underlying low-grade lymphoma. His plalete is 107 today, no bleeding tendency. -will follow up by CBC -May consult to hematologist in the morning  HLD: No LDL on record. Not on med at home -start Lipitor due for possible stroke -Check FLP  Hypothyroidism: Last TSH was 2.549 on 03/25/13 -Continue home Synthroid -Check TSH  Polymyalgia: Stable. Patient is taking prednisone chronically, 10 mg daily. Blood pressure stable, no signs of acute adrenal insufficiency -Give stress dose of Solu Cortef, 50 mg 1 -Check cortisol level -Continue home prednisone  CAD: s/p of stent 2006. No chest pain. -When necessary nitroglycerin -Started the Lipitor  Essential hypertension: -Hold Benicar-HCTZ in the setting of possible acute stroke -IV hydralazine when necessary  GERD (gastroesophageal reflux disease): -start protonix  DM-II: No A1c on record. Patient is taking Novolin when necessary at home -SSI -Check A1c  Diarrhea: Etiology is not clear, may be related to the side effects of lodine vs. viral gastroenteritis, but need to rule out other possibilities, such as a C. difficile colitis. -prn zofran for nausea -IVF: ns 75 cc/h -check c diff pcr if has diarrhea again   DVT ppx: SQ Heparin   Code Status: Full code Family Communication:  Yes, patient's wife at bed side Disposition Plan: Admit to inpatient   Date of Service 10/28/2014    Ivor Costa [facility] Pager [phone]  If 7PM-7AM, please contact  night-coverage [email or web address] Password [facility] 10/28/2014, 3:57 AM

## 2014-10-28 NOTE — ED Notes (Signed)
Portable chest x ray completed.

## 2014-10-28 NOTE — Progress Notes (Signed)
Pt arrived to room 4N13 from ED. Pt is alert and oriented.  Wife is at bedside.  Safety measures in place, tele applied, and contact precautions initiated.  Will continue to monitor.   Fredrich Romans, RN

## 2014-10-28 NOTE — Progress Notes (Signed)
STROKE TEAM PROGRESS NOTE   HISTORY Nathan Nathan Kemp is an 74 y.o. Nathan Kemp with a past medical history significant for HTN, hyperlipidemia, diet controlled DM, CAD s/p stenting, thrombocytopenia, vitamin D deficiency, DDD cervical spine, isolated peripheral vertigo 10 years ago, brought in by EMS due to acute onset of acute vertigo, diplopia, nausea, vomiting, dysarthria. Patient wife is at the bedside and stated that when she hot home found him very sweaty, complaining of not feeling feeling, nauseated, dizzy, and she noted that the left lips were down.Pt had a temporary dental crown placed today and took one dose of Lodine as prescribed this evening around 6 PM just after to dinner, and therefore wife and patient initially thought symptoms were side effects of Lodine. Nathan Nathan Kemp report a sensation of " things spinning around mainly when I try to move", double vision, and severe nausea. Denies HA, difficulty swallowing, focal weakness or numbness, slurred speech, language or vision impairment. No recent upper respiratory infection, fever, tinnitus, hearing loss, head or neck trauma. CT brain was personally reviewed and showed no acute abnormality. Platelet count pending. Slight leukocytosis. Na 146. ETOH Level <5  Date last known well: 10/27/14 Time last known well: 7:30 pm tPA Given: no, very mild deficits. NIHSS: 1 MRS: 0   SUBJECTIVE (INTERVAL HISTORY) No family is at the bedside.  Overall he feels his condition is  improving.    OBJECTIVE Temp:  [97.5 F (36.4 C)-98.1 F (36.7 C)] 98.1 F (36.7 C) (07/12 1000) Pulse Rate:  [57-69] 69 (07/12 1000) Cardiac Rhythm:  [-] Normal sinus rhythm (07/12 0800) Resp:  [13-26] 16 (07/12 1000) BP: (108-164)/(63-109) 118/67 mmHg (07/12 1000) SpO2:  [90 %-100 %] 95 % (07/12 1000) Weight:  [97.886 kg (215 lb 12.8 oz)] 97.886 kg (215 lb 12.8 oz) (07/12 0410)   Recent Labs Lab 10/28/14 0544  GLUCAP 137*    Recent Labs Lab 10/27/14 2344  10/27/14 2350  NA 138 141  K 3.9 3.9  CL 103 103  CO2 25  --   GLUCOSE 205* 207*  BUN 18 20  CREATININE 0.71 0.70  CALCIUM 9.0  --     Recent Labs Lab 10/27/14 2344  AST 22  ALT 31  ALKPHOS 100  BILITOT 0.6  PROT 7.2  ALBUMIN 3.9    Recent Labs Lab 10/27/14 2344 10/27/14 2350  WBC 11.0*  --   NEUTROABS 6.8  --   HGB 13.3 14.6  HCT 40.8 43.0  MCV 90.9  --   PLT 107*  --    No results for input(s): CKTOTAL, CKMB, CKMBINDEX, TROPONINI in the last 168 hours.  Recent Labs  10/27/14 2344  LABPROT 14.8  INR 1.14    Recent Labs  10/28/14 0134  COLORURINE YELLOW  LABSPEC 1.027  PHURINE 5.0  GLUCOSEU NEGATIVE  HGBUR NEGATIVE  BILIRUBINUR LARGE*  KETONESUR NEGATIVE  PROTEINUR NEGATIVE  UROBILINOGEN 0.2  NITRITE NEGATIVE  LEUKOCYTESUR NEGATIVE       Component Value Date/Time   CHOL 94 10/28/2014 0525   TRIG 91 10/28/2014 0525   HDL 19* 10/28/2014 0525   CHOLHDL 4.9 10/28/2014 0525   VLDL 18 10/28/2014 0525   LDLCALC 57 10/28/2014 0525   No results found for: HGBA1C    Component Value Date/Time   LABOPIA NONE DETECTED 10/28/2014 0134   COCAINSCRNUR NONE DETECTED 10/28/2014 0134   LABBENZ NONE DETECTED 10/28/2014 0134   AMPHETMU NONE DETECTED 10/28/2014 0134   THCU NONE DETECTED 10/28/2014 0134   LABBARB NONE  DETECTED 10/28/2014 0134     Recent Labs Lab 10/27/14 2343  ETH <5    Ct Head Wo Contrast 10/27/2014   No acute intracranial pathology.  Mild age-related atrophy and chronic microvascular ischemic disease.  If symptoms persist and there are no contraindications, MRI may provide better evaluation if clinically indicated    MRI HEAD  10/28/2014   IMPRESSION:  1. No acute intracranial infarct or other abnormality identified. 2. Moderate generalized cerebral atrophy with mild chronic small vessel ischemic disease.    MRA HEAD  10/28/2014   IMPRESSION:  1. No large vessel occlusion or hemodynamically significant stenosis identified within the  intracranial circulation. Please note that this study is moderately degraded by motion artifact. 2. 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 A short interval follow-up study is suggested to evaluate the stability of this finding.     Dg Chest Port 1 View 10/28/2014   No focal consolidation.     Carotid Doppler  There is 1-39% bilateral ICA stenosis. Vertebral artery flow is antegrade.     PHYSICAL EXAM Nathan Nathan Kemp currently not in distress. . Afebrile. Head is nontraumatic. Neck is supple without bruit.    Cardiac exam no murmur or gallop. Lungs are clear to auscultation. Distal pulses are well felt.\ Neurological Exam ;  Awake  Alert oriented x 3. Normal speech and language.eye movements full without nystagmus.fundi were not visualized. Vision acuity and fields appear normal. Hearing is normal. Palatal movements are normal. Face symmetric. Tongue midline. Head shaking did not produce any subjective vertigo or nystagmus. Hallpike maneuver was not done Normal strength, tone, reflexes and coordination. Normal sensation. Gait deferred.  ASSESSMENT/PLAN Nathan. Nathan Nathan Kemp is a 74 y.o. Nathan Kemp with history of HTN, hyperlipidemia, diet controlled DM, CAD s/p stenting, thrombocytopenia, vitamin D deficiency, DDD cervical spine, isolated peripheral vertigo 10 years ago presenting with acute vertigo, diplopia, nausea, vomiting, dysarthria. He did not receive IV t-PA due to very mild deficits.   Possible inner ear abnormality (doubt BPPV as not reproducible, has hx of labyrinthitis) vs TIA  MRI  No acute stroke  MRA  No large vessel stensosis. 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 PLT 107, ok to start low dose aspirin 81 mg daily for secondary stroke prevention  Ongoing aggressive stroke risk factor management  Therapy recommendations:  pending   Disposition:  pending   Hypertension  Stable  Diabetes  HgbA1c pending , goal < 7.0  Other Stroke Risk Factors  Advanced age  Obesity, Body mass index is 31.85 kg/(m^2).   Family hx stroke (mother)  Coronary artery disease - stent 2006  Other Active Problems  Thrombocytopenia, etiology no clear, followed by Dr. Beryle Beams. PLT 107  Hypothyroidism  Polymyalgia on prednisone  GERD  Hospital day # 0  Radene [facility] for Pager information 10/28/2014 1:06 PM  I have personally examined this patient, reviewed notes, independently viewed imaging studies, participated in medical decision making and plan of care. I have made any additions or clarifications directly to the above note. Agree with note above. He presented with transient vertigo,  nausea, vomiting and ataxia lasting several hours which seems to have improved. This may represent posterior circulation TIA versus acute labyrintine dysfunction particularly since he had somewhat similar and mild episode 6-8 years ago. He remains at risk for neurological worsening, recurrent stroke, TIAs and needs ongoing stroke evaluation and aggressive risk factor control. Recommend aspirin 81 mg orally given history of trauma cytopenia. Vestibular rehabilitation consult.  Antony Contras, MD Medical Director [facility] Pager: [phone] 10/28/2014 2:17 PM    To contact Stroke Continuity provider, please refer to [email or web address] After hours, contact General Neurology

## 2014-10-28 NOTE — ED Notes (Signed)
Pt gone to MRI 

## 2014-10-28 NOTE — Progress Notes (Signed)
PT Cancellation Note  Patient Details Name: Nathan Kemp MRN: [id number] DOB: Jun 06, 1940   Cancelled Treatment:    Reason Eval/Treat Not Completed: Patient not medically ready. Patient remains on bedrest at this time, will follow once activity orders updated.   Duncan Dull 10/28/2014, 7:49 AM Alben Deeds, PT DPT  [phone]

## 2014-10-28 NOTE — Progress Notes (Signed)
Patient seen and examined, MRI negative for acute CVA, symptoms of vertigo have resolved, most likely medication side effect posterior circulation TIA versus acute labyrintine dysfunction particularly since he had somewhat similar and mild episode 6-8 years ago,   neurology recommends aspirin 81 mg a day, Physical therapy evaluation is pending Bedrest order has been discontinued Stroke team following When necessary meclizine if needed for dizziness Vestibular rehabilitation consult

## 2014-10-28 NOTE — Consult Note (Addendum)
Referring Physician: Dr Nathan Kemp    Chief Complaint: code stroke, acute vertigo, diplopia, nausea, vomiting, dysarthria  HPI:                                                                                                                                         Nathan Kemp is an 74 y.o. male with a past medical history significant for HTN, hyperlipidemia, diet controlled DM, CAD s/p stenting, thrombocytopenia, vitamin D deficiency,  DDD cervical spine, isolated peripheral vertigo 10 years ago, brought in by EMS due to acute onset of the above stated symptoms. Patient wife is at the bedside and stated that when she hot home found him very sweaty, complaining of not feeling feeling, nauseated, dizzy, and she noted that the left lips were down.Pt had a temporary dental crown placed today and took one dose of Lodine as prescribed this evening around 6 PM just after to dinner, and therefore wife and patient initially thought symptoms were side effects of Lodine. Nathan Kemp report a sensation of " things spinning around mainly when I try to move", double vision, and severe nausea. Denies HA, difficulty swallowing, focal weakness or numbness, slurred speech, language or vision impairment. No recent upper respiratory infection, fever, tinnitus, hearing loss, head or neck trauma. CT brain was personally reviewed and showed no acute abnormality. Platelet count pending. Slight leukocytosis. Na 146. ETOH Level <5  Date last known well: 10/27/14 Time last known well: 7:30 pm tPA Kemp: no, very mild deficits. NIHSS: 1 MRS: 0  Past Medical History  Diagnosis Date  . Anorexia   . Thrombocytopenia   . Fatigue   . Hypertension   . Hyperlipidemia   . Vitamin D deficiency   . Obesity   . DDD (degenerative disc disease), cervical   . Thyroid disease   . Trouble swallowing   . Cough   . Leg swelling     due to lymphedema  . Hypothyroidism   . Diabetes mellitus     borderline-controlled by diet  .  Acute GI bleeding 04/16/2011  . Coronary artery disease     stent 06  . Heart murmur   . Asthma     when  have congstion  of chest  . Pneumonia   . Anemia   . Blood dyscrasia 06/2009    thrombocytopenia  . Blood transfusion     platelets 12/12  . GERD (gastroesophageal reflux disease)   . 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 ED (erectile dysfunction)   . Polymyalgia   . IBS (irritable bowel syndrome)     Past Surgical History  Procedure Laterality Date  . Cardiac catheterization  01/31/2005    EF 60-65%  . Coronary angioplasty with stent placement  2006    STENTING OF HIS CORONARY ARTERY  . Appendectomy  1967  . US echocardiography  02/28/2005  EF 55-60%  . Tonsillectomy and adenoidectomy    . Nasal sinus surgery  2005  . Edg dilitation  [id number]  . Lymph node removal  2011    right groin  . Bone marrow biopsy      2011-at [facility], 01/2010-at [facility], 06/12-[facility]  . Lymph node biopsy  03/15/2011    Procedure: LYMPH NODE BIOPSY;  Surgeon: Earnstine Regal, MD;  Location: [facility];  Service: General;  Laterality: Right;  Right Anterior Cervical Lymph Node Excisional Biopsy   . 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 Cataract Bilateral     Family History  Problem Relation Age of Onset  . Heart failure Father   . Hypertension Father   . Hearing loss Father 69    congestive heart failure  . Diabetes Father   . Hypertension Mother   . Stroke Mother 86   Social History:  reports that he has never smoked. He has never used smokeless tobacco. He reports that he does not drink alcohol or use illicit drugs. Family history: no brain tumors, epilepsy, MS Allergies:  Allergies  Allergen Reactions  . Cefdinir Hives  . Adhesive [Tape] Other (See Comments)    blisters  . Lortab [Hydrocodone-Acetaminophen] Other (See Comments)    Makes patient very loopy. Update   Able to take now  . Niaspan [Niacin] Hives  .  Clindamycin Nausea Only and Other (See Comments)    Stomach cramps  . Codeine Rash    Had rash previously but not recently-taken in cough medicine and no problems but patient can tolerate    Medications:                                                                                                                           I have reviewed the patient's current medications.  ROS:                                                                                                                                       History obtained from wife, chart review and the patient  General ROS: negative for - chills, fatigue, fever, night sweats, weight gain or weight loss Psychological ROS: negative for - behavioral disorder, hallucinations, mood swings or suicidal ideation Ophthalmic ROS: negative for - blurry vision,  double vision, eye pain or loss of vision ENT ROS: negative for - epistaxis, nasal discharge, oral lesions, sore throat, tinnitus or vertigo Allergy and Immunology ROS: negative for - hives or itchy/watery eyes Hematological and Lymphatic ROS: negative for - bleeding problems, bruising or swollen lymph nodes Endocrine ROS: negative for - galactorrhea, hair pattern changes, polydipsia/polyuria or temperature intolerance Respiratory ROS: negative for - cough, hemoptysis, shortness of breath or wheezing Cardiovascular ROS: negative for - chest pain, dyspnea on exertion, edema or irregular heartbeat Gastrointestinal ROS: negative for - abdominal pain, diarrhea, hematemesis, nausea/vomiting or stool incontinence Genito-Urinary ROS: negative for - dysuria, hematuria, incontinence or urinary frequency/urgency Musculoskeletal ROS: negative for - joint swelling or muscular weakness Neurological ROS: as noted in HPI Dermatological ROS: negative for rash and skin lesion changes   Physical exam: pleasant male in some distress  due to vertigo and nausea. Blood pressure 161/83, pulse 63, temperature  97.5 F (36.4 C), temperature source Oral, resp. rate 26, SpO2 97 %. Head: normocephalic. Neck: supple, no bruits, no JVD. Cardiac: no murmurs. Lungs: clear. Abdomen: soft, no tender, no mass. Extremities: no edema.  Neurologic Examination:                                                                                                      General: Mental Status: Alert, oriented, thought content appropriate.  Speech fluent without evidence of aphasia.  Able to follow 3 step commands without difficulty. Cranial Nerves: II: Discs flat bilaterally; Visual fields grossly normal, pupils equal, round, reactive to light and accommodation III,IV, VI: ptosis not present, multidirectional rotatory nystagmus V,VII: smile symmetric, facial light touch sensation normal bilaterally VIII: hearing normal bilaterally IX,X: uvula rises symmetrically XI: bilateral shoulder shrug XII: midline tongue extension without atrophy or fasciculations  Motor: Right : Upper extremity   5/5    Left:     Upper extremity   5/5  Lower extremity   5/5     Lower extremity   5/5 Tone and bulk:normal tone throughout; no atrophy noted Sensory: Pinprick and light touch intact throughout, bilaterally Deep Tendon Reflexes:  Right: Upper Extremity   Left: Upper extremity   biceps (C-5 to C-6) 2/4   biceps (C-5 to C-6) 2/4 tricep (C7) 2/4    triceps (C7) 2/4 Brachioradialis (C6) 2/4  Brachioradialis (C6) 2/4  Lower Extremity Lower Extremity  quadriceps (L-2 to L-4) 2/4   quadriceps (L-2 to L-4) 2/4 Achilles (S1) 2/4   Achilles (S1) 2/4  Plantars: Right: downgoing   Left: downgoing Cerebellar: normal finger-to-nose,  normal heel-to-shin test Gait: No tested due to safety reasons, multiple leads    Results for orders placed or performed during the hospital encounter of 10/27/14 (from the past 48 hour(s))  Ethanol     Status: None   Collection Time: 10/27/14 11:43 PM  Result Value Ref Range   Alcohol, Ethyl (B)  <5 <5 mg/dL    Comment:        LOWEST DETECTABLE LIMIT FOR SERUM ALCOHOL IS 5 mg/dL FOR MEDICAL PURPOSES ONLY   Protime-INR  Status: None   Collection Time: 10/27/14 11:44 PM  Result Value Ref Range   Prothrombin Time 14.8 11.6 - 15.2 seconds   INR 1.14 0.00 - 1.49  APTT     Status: None   Collection Time: 10/27/14 11:44 PM  Result Value Ref Range   aPTT 35 24 - 37 seconds  CBC     Status: Abnormal (Preliminary result)   Collection Time: 10/27/14 11:44 PM  Result Value Ref Range   WBC 11.0 (H) 4.0 - 10.5 K/uL   RBC 4.49 4.22 - 5.81 MIL/uL   Hemoglobin 13.3 13.0 - 17.0 g/dL   HCT 40.8 39.0 - 52.0 %   MCV 90.9 78.0 - 100.0 fL   MCH 29.6 26.0 - 34.0 pg   MCHC 32.6 30.0 - 36.0 g/dL   RDW 16.0 (H) 11.5 - 15.5 %   Platelets PENDING 150 - 400 K/uL  Differential     Status: Abnormal   Collection Time: 10/27/14 11:44 PM  Result Value Ref Range   Neutrophils Relative % 61 43 - 77 %   Neutro Abs 6.8 1.7 - 7.7 K/uL   Lymphocytes Relative 21 12 - 46 %   Lymphs Abs 2.3 0.7 - 4.0 K/uL   Monocytes Relative 18 (H) 3 - 12 %   Monocytes Absolute 1.9 (H) 0.1 - 1.0 K/uL   Eosinophils Relative 1 0 - 5 %   Eosinophils Absolute 0.1 0.0 - 0.7 K/uL   Basophils Relative 0 0 - 1 %   Basophils Absolute 0.0 0.0 - 0.1 K/uL  Comprehensive metabolic panel     Status: Abnormal   Collection Time: 10/27/14 11:44 PM  Result Value Ref Range   Sodium 138 135 - 145 mmol/L   Potassium 3.9 3.5 - 5.1 mmol/L   Chloride 103 101 - 111 mmol/L   CO2 25 22 - 32 mmol/L   Glucose, Bld 205 (H) 65 - 99 mg/dL   BUN 18 6 - 20 mg/dL   Creatinine, Ser 0.71 0.61 - 1.24 mg/dL   Calcium 9.0 8.9 - 10.3 mg/dL   Total Protein 7.2 6.5 - 8.1 g/dL   Albumin 3.9 3.5 - 5.0 g/dL   AST 22 15 - 41 U/L   ALT 31 17 - 63 U/L   Alkaline Phosphatase 100 38 - 126 U/L   Total Bilirubin 0.6 0.3 - 1.2 mg/dL   GFR calc non Af Amer >60 >60 mL/min   GFR calc Af Amer >60 >60 mL/min    Comment: (NOTE) The eGFR has been calculated using  the CKD EPI equation. This calculation has not been validated in all clinical situations. eGFR's persistently <60 mL/min signify possible Chronic Kidney Disease.    Anion gap 10 5 - 15  I-stat troponin, ED (not at [facility], [facility])     Status: None   Collection Time: 10/27/14 11:49 PM  Result Value Ref Range   Troponin i, poc 0.00 0.00 - 0.08 ng/mL   Comment 3            Comment: Due to the release kinetics of cTnI, a negative result within the first hours of the onset of symptoms does not rule out myocardial infarction with certainty. If myocardial infarction is still suspected, repeat the test at appropriate intervals.   I-Stat Chem 8, ED  (not at [facility])     Status: Abnormal   Collection Time: 10/27/14 11:50 PM  Result Value Ref Range   Sodium 141 135 - 145 mmol/L  Potassium 3.9 3.5 - 5.1 mmol/L   Chloride 103 101 - 111 mmol/L   BUN 20 6 - 20 mg/dL   Creatinine, Ser 0.70 0.61 - 1.24 mg/dL   Glucose, Bld 207 (H) 65 - 99 mg/dL   Calcium, Ion 1.21 1.13 - 1.30 mmol/L   TCO2 25 0 - 100 mmol/L   Hemoglobin 14.6 13.0 - 17.0 g/dL   HCT 43.0 39.0 - 52.0 %   Ct Head Wo Contrast  10/27/2014   CLINICAL DATA:  74 year old male with asymmetric pupils, and concern for stroke  EXAM: CT HEAD WITHOUT CONTRAST  TECHNIQUE: Contiguous axial images were obtained from the base of the skull through the vertex without intravenous contrast.  COMPARISON:  None.  FINDINGS: There is slight prominence of the ventricles and sulci compatible with age-related volume loss. Mild periventricular and deep white matter hypodensities represent chronic microvascular ischemic changes. There is no intracranial hemorrhage. No mass effect or midline shift identified.  There is mild mucoperiosteal thickening of the paranasal sinuses with partial opacification of the maxillary sinuses. The visualized mastoid air cells are clear. The calvarium is intact.  IMPRESSION: No acute intracranial pathology.  Mild age-related atrophy and  chronic microvascular ischemic disease.  If symptoms persist and there are no contraindications, MRI may provide better evaluation if clinically indicated  Critical Value/emergent results were called by telephone at the time of interpretation on 10/27/2014 at 11:51 pm to nurse Owens Shark, who verbally acknowledged these results.   Electronically Signed   By: Anner Crete M.D.   On: 10/27/2014 23:51   Assessment: 74 y.o. male brought in as a code stroke due to acute onset vertigo, nausea, vomiting, dysarthria. NIHSS 1, CT brain without acute abnormality.  Posterior circulation infarct versus peripheral vestibulopathy. Recommend: admission to hospital, stroke work up. Patient with h/o severe thrombocytopenia of unclear etiology, platelet count pending at this time but as per wife patient has been considered not a candidate to take antiplatelets: if patient indeed had stroke ,secondary stroke prevention regimen will need to be addressed with patient hematologist.  Stroke Risk Factors -  Age, HTN, hyperlipidemia, diet controlled DM, CAD   Plan: 1. HgbA1c, fasting lipid panel 2. MRI, MRA  of the brain without contrast 3. Echocardiogram 4. Carotid dopplers 5. Prophylactic therapy-antiplatelet therapy to be discussed with patient hematologist 6. Risk factor modification 7. Telemetry monitoring 8. Frequent neuro checks  Dorian Pod, MD [facility] [phone]  10/28/2014, 12:28 AM    9. PT/OT SLP

## 2014-10-29 ENCOUNTER — Ambulatory Visit (HOSPITAL_COMMUNITY): Payer: Medicare Other

## 2014-10-29 DIAGNOSIS — R42 Dizziness and giddiness: Principal | ICD-10-CM

## 2014-10-29 DIAGNOSIS — I2510 Atherosclerotic heart disease of native coronary artery without angina pectoris: Secondary | ICD-10-CM

## 2014-10-29 DIAGNOSIS — I6330 Cerebral infarction due to thrombosis of unspecified cerebral artery: Secondary | ICD-10-CM | POA: Insufficient documentation

## 2014-10-29 LAB — LIPID PANEL
CHOL/HDL RATIO: 4.9 ratio
Cholesterol: 89 mg/dL (ref 0–200)
HDL: 18 mg/dL — AB (ref >40)
LDL CALC: 48 mg/dL (ref 0–99)
Triglycerides: 113 mg/dL (ref <150)
VLDL: 23 mg/dL (ref 0–40)

## 2014-10-29 LAB — HEMOGLOBIN A1C
Hgb A1c MFr Bld: 6.6 % — ABNORMAL HIGH (ref 4.8–5.6)
MEAN PLASMA GLUCOSE: 143 mg/dL

## 2014-10-29 LAB — GLUCOSE, CAPILLARY: Glucose-Capillary: 123 mg/dL — ABNORMAL HIGH (ref 65–99)

## 2014-10-29 MED ORDER — ASPIRIN 81 MG PO TBEC: 81.0000 mg | DELAYED_RELEASE_TABLET | Freq: Every day | ORAL | Status: DC

## 2014-10-29 MED ORDER — MECLIZINE HCL 25 MG PO TABS: 25.0000 mg | ORAL_TABLET | Freq: Three times a day (TID) | ORAL | Status: DC | PRN

## 2014-10-29 MED ORDER — ATORVASTATIN CALCIUM 40 MG PO TABS: 40.0000 mg | ORAL_TABLET | Freq: Every day | ORAL | Status: DC

## 2014-10-29 NOTE — Evaluation (Signed)
{  EVAL [id number] Cancellation Note  Patient Details Name: Nathan Kemp MRN: [id number] DOB: Jan 14, 1941   Cancelled treatment:       Reason Eval/Treat Not Completed: SLP screened, no needs identified, will sign off   Luanna Salk, [facility] SLP [phone]

## 2014-10-29 NOTE — Discharge Summary (Signed)
Physician Discharge Summary  Nathan Kemp:[id number] DOB: 12-08-1940 DOA: 10/27/2014  PCP: Geoffery Lyons, MD  Admit date: 10/27/2014 Discharge date: 10/29/2014  Time spent: 45 minutes  Recommendations for Outpatient Follow-up:  -Will be discharged home today. -Advised to follow-up with primary care provider in 2 weeks.   Discharge Diagnoses:  Principal Problem:   Stroke Active Problems:   HLD (hyperlipidemia)   Hypothyroidism   CAD (coronary artery disease)   Essential hypertension   GERD (gastroesophageal reflux disease)   Diabetes mellitus without complication   History of GI bleed   Vertigo   Diarrhea   Discharge Condition: Stable and improved  Filed Weights   10/28/14 0410  Weight: 97.886 kg (215 lb 12.8 oz)    History of present illness:  : Nathan Kemp is a 74 y.o. male with PMH of HTN, hyperlipidemia, DM, CAD s/p stent 2006, thrombocytopenia, vitamin D deficiency, DDD cervical spine, isolated peripheral vertigo 10 years ago, who presents with dizziness, left facial droop.  Patient reports that he started having dizziness at about 7 PM. It is associated with left facial droop, nausea and sweating. No difficulty swallowing or slurred speech. No unilateral weakness, numbness or tingling sensations. Pt feels that the room is spinning around him when he opens his eyes. No ear ringing or vision loss. Pt had temporary dental crown placed today. He took one dose of Lodine which was prescribed by his dentist this evening around 6 PM, and therefore wife and patient initially thought symptoms were side effects of Lodine. Patient does not have chest pain, shortness breath, abdominal pain. No symptoms of UTI. He had 4 bowel movements with loose stool today. He also had nausea and vomited 4 times today. No abdominal pain. No recent antibodies used.  Hospital Course:   Dizziness/nausea/vomiting -Differentials at this point include medication effect from  etodolac, possible posterior circulation TIA less likely, inner ear problem like BPPV also less likely.  -Has been seen by vestibular PT and they were not able to elicit any symptoms of BPPV. -MRI has been negative for acute CVA -Symptoms have now resolved, making a medication effect secondary to etodolac most likely. He had received 2 doses of this medication following some dental extractions. -Continue aspirin daily, meclizine as needed for dizziness.  Rest of chronic medical conditions have been stable, home medications have not been changed.  Procedures:  Carotid Dopplers: 1-39% ICA stenosis bilaterally with antegrade vertebral flow.  Consultations:  Neurology, Dr. Leonie Man  Discharge Instructions  Discharge Instructions    Diet - low sodium heart healthy    Complete by:  As directed      Increase activity slowly    Complete by:  As directed             Medication List    TAKE these medications        AMBIEN 10 MG tablet  Generic drug:  zolpidem  Take 10 mg by mouth at bedtime.     aspirin 81 MG EC tablet  Take 1 tablet (81 mg total) by mouth daily.     atorvastatin 40 MG tablet  Commonly known as:  LIPITOR  Take 1 tablet (40 mg total) by mouth daily at 6 PM.     CALCIUM PO  Take 330 mg by mouth daily.     cholestyramine 4 G packet  Commonly known as:  QUESTRAN  Take 4 g by mouth daily.     dicyclomine 20 MG tablet  Commonly  known as:  BENTYL  Take 20 mg by mouth 3 (three) times daily.     insulin regular 100 units/mL injection  Commonly known as:  NOVOLIN R,HUMULIN R  Inject 6 Units into the skin daily as needed for high blood sugar. For glucose greater than 175     levothyroxine 100 MCG tablet  Commonly known as:  SYNTHROID, LEVOTHROID  Take 100 mcg by mouth daily.     meclizine 25 MG tablet  Commonly known as:  ANTIVERT  Take 1 tablet (25 mg total) by mouth 3 (three) times daily as needed for dizziness.     nitroGLYCERIN 0.4 MG SL tablet  Commonly  known as:  NITROSTAT  PLACE 1 TABLET UNDER THE TONGUE EVERY 5 MINUTES AS NEEDED FOR CHEST PAIN     olmesartan-hydrochlorothiazide 40-25 MG per tablet  Commonly known as:  BENICAR HCT  Take 0.5 tablets by mouth every morning.     predniSONE 10 MG tablet  Commonly known as:  DELTASONE  Take 10 mg by mouth daily with breakfast.     SYSTANE 0.4-0.3 % Gel  Generic drug:  Polyethyl Glycol-Propyl Glycol  Apply 1 Dose to eye at bedtime.     SYSTANE ULTRA OP  Place 1-2 drops into both eyes 4 (four) times daily as needed (dry eyes).     TYLENOL ARTHRITIS PAIN 650 MG CR tablet  Generic drug:  acetaminophen  Take 650 mg by mouth 3 (three) times daily as needed for pain. Takes 650 mg tid-qid     VITAMIN D-3 PO  Take 1 tablet by mouth daily.       Allergies  Allergen Reactions  . Cefdinir Hives  . Adhesive [Tape] Other (See Comments)    blisters  . Lortab [Hydrocodone-Acetaminophen] Other (See Comments)    Makes patient very loopy. Update   Able to take now  . Niaspan [Niacin] Hives  . Clindamycin Nausea Only and Other (See Comments)    Stomach cramps  . Codeine Rash    Had rash previously but not recently-taken in cough medicine and no problems but patient can tolerate       Follow-up Information    Follow up with ARONSON,RICHARD A, MD. Schedule an appointment as soon as possible for a visit in 2 weeks.   Specialty:  Internal Medicine   Contact information:   [address] [place] [address] [phone]        The results of significant diagnostics from this hospitalization (including imaging, microbiology, ancillary and laboratory) are listed below for reference.    Significant Diagnostic Studies: Ct Head Wo Contrast  10/27/2014   CLINICAL DATA:  74 year old male with asymmetric pupils, and concern for stroke  EXAM: CT HEAD WITHOUT CONTRAST  TECHNIQUE: Contiguous axial images were obtained from the base of the skull through the vertex without intravenous contrast.   COMPARISON:  None.  FINDINGS: There is slight prominence of the ventricles and sulci compatible with age-related volume loss. Mild periventricular and deep white matter hypodensities represent chronic microvascular ischemic changes. There is no intracranial hemorrhage. No mass effect or midline shift identified.  There is mild mucoperiosteal thickening of the paranasal sinuses with partial opacification of the maxillary sinuses. The visualized mastoid air cells are clear. The calvarium is intact.  IMPRESSION: No acute intracranial pathology.  Mild age-related atrophy and chronic microvascular ischemic disease.  If symptoms persist and there are no contraindications, MRI may provide better evaluation if clinically indicated  Critical Value/emergent results were called by  telephone at the time of interpretation on 10/27/2014 at 11:51 pm to nurse Owens Shark, who verbally acknowledged these results.   Electronically Signed   By: Anner Crete M.D.   On: 10/27/2014 23:51   Mr Jodene Nam Head Wo Contrast  10/28/2014   CLINICAL DATA:  Initial evaluation for acute vertigo, evaluate for post ventilation stroke  EXAM: MRI HEAD WITHOUT CONTRAST  MRA HEAD WITHOUT CONTRAST  TECHNIQUE: Multiplanar, multiecho pulse sequences of the brain and surrounding structures were obtained without intravenous contrast. Angiographic images of the head were obtained using MRA technique without contrast.  COMPARISON:  Prior CT from 10/27/2014  FINDINGS: MRI HEAD FINDINGS  Study is moderately degraded by motion artifact.  Moderate diffuse prominence of the CSF containing spaces is compatible with generalized age-related cerebral atrophy. Mild chronic small vessel ischemic type changes present within the periventricular white matter.  No abnormal foci of restricted diffusion to suggest acute intracranial infarct. Gray-white matter differentiation maintained. Normal intravascular flow voids are preserved. No acute or chronic intracranial hemorrhage.  No  mass lesion, midline shift, or mass effect. No hydrocephalus. No extra-axial fluid collection.  Craniocervical junction within normal limits. Pituitary gland normal. No acute abnormality about the orbits. Sequelae of prior bilateral lens extraction noted.  Mild mucosal thickening present within the maxillary sinuses and ethmoidal air cells. No air-fluid level to suggest active sinus infection. Minimal scattered opacity present within the left mastoid air cells. Right mastoid air cells are clear. Inner ear structures grossly normal.  Bone marrow signal intensity within normal limits. Scalp soft tissues unremarkable.  MRA HEAD FINDINGS  ANTERIOR CIRCULATION:  Study is moderately degraded by motion artifact.  Visualized distal cervical segments of the internal carotid arteries are patent with antegrade flow. The petrous, cavernous, and supra clinoid segments are well opacified bilaterally. A1 segments, anterior communicating artery, and anterior cerebral arteries patent bilaterally. M1 segments widely patent without focal stenosis or occlusion. MCA bifurcations grossly normal. A para focal outpouching arising from the inferior aspect of the right MCA bifurcation favored to reflect a normal infundibulum. Distal MCA branches not well evaluated on this exam due to motion artifact, but are grossly symmetric bilaterally.  POSTERIOR CIRCULATION:  Vertebral arteries are patent to the vertebrobasilar junction. Evaluation of the vertebral arteries and cells fairly limited due to motion artifact. Posterior inferior cerebral arteries not well seen. Basilar artery appears to be mildly tortuous but patent to its distal aspect. Superior cerebellar arteries are patent proximally. Both posterior cerebral arteries arise from the basilar artery and appear to be patent to their distal aspects. A right posterior communicating artery with smaller left posterior communicating artery is present.  There is a 4-5 mm focal outpouching arising  from the distal cavernous/supra clinoid right ICA near the expected takeoff of the right ophthalmic artery. Finding is suspicious for a possible small aneurysm (series 6, image 87). This is directed anteriorly and medially. No other definite aneurysm or vascular malformation identified on this motion degraded study.  IMPRESSION: MRI HEAD IMPRESSION:  1. No acute intracranial infarct or other abnormality identified. 2. Moderate generalized cerebral atrophy with mild chronic small vessel ischemic disease.  MRA HEAD IMPRESSION:  1. No large vessel occlusion or hemodynamically significant stenosis identified within the intracranial circulation. Please note that this study is moderately degraded by motion artifact. 2. 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 A short interval follow-up study is suggested to evaluate the stability of  this finding.   Electronically Signed   By: Jeannine Boga M.D.   On: 10/28/2014 04:54   Mr Brain Wo Contrast  10/28/2014   CLINICAL DATA:  Initial evaluation for acute vertigo, evaluate for post ventilation stroke  EXAM: MRI HEAD WITHOUT CONTRAST  MRA HEAD WITHOUT CONTRAST  TECHNIQUE: Multiplanar, multiecho pulse sequences of the brain and surrounding structures were obtained without intravenous contrast. Angiographic images of the head were obtained using MRA technique without contrast.  COMPARISON:  Prior CT from 10/27/2014  FINDINGS: MRI HEAD FINDINGS  Study is moderately degraded by motion artifact.  Moderate diffuse prominence of the CSF containing spaces is compatible with generalized age-related cerebral atrophy. Mild chronic small vessel ischemic type changes present within the periventricular white matter.  No abnormal foci of restricted diffusion to suggest acute intracranial infarct. Gray-white matter differentiation maintained. Normal intravascular flow voids are  preserved. No acute or chronic intracranial hemorrhage.  No mass lesion, midline shift, or mass effect. No hydrocephalus. No extra-axial fluid collection.  Craniocervical junction within normal limits. Pituitary gland normal. No acute abnormality about the orbits. Sequelae of prior bilateral lens extraction noted.  Mild mucosal thickening present within the maxillary sinuses and ethmoidal air cells. No air-fluid level to suggest active sinus infection. Minimal scattered opacity present within the left mastoid air cells. Right mastoid air cells are clear. Inner ear structures grossly normal.  Bone marrow signal intensity within normal limits. Scalp soft tissues unremarkable.  MRA HEAD FINDINGS  ANTERIOR CIRCULATION:  Study is moderately degraded by motion artifact.  Visualized distal cervical segments of the internal carotid arteries are patent with antegrade flow. The petrous, cavernous, and supra clinoid segments are well opacified bilaterally. A1 segments, anterior communicating artery, and anterior cerebral arteries patent bilaterally. M1 segments widely patent without focal stenosis or occlusion. MCA bifurcations grossly normal. A para focal outpouching arising from the inferior aspect of the right MCA bifurcation favored to reflect a normal infundibulum. Distal MCA branches not well evaluated on this exam due to motion artifact, but are grossly symmetric bilaterally.  POSTERIOR CIRCULATION:  Vertebral arteries are patent to the vertebrobasilar junction. Evaluation of the vertebral arteries and cells fairly limited due to motion artifact. Posterior inferior cerebral arteries not well seen. Basilar artery appears to be mildly tortuous but patent to its distal aspect. Superior cerebellar arteries are patent proximally. Both posterior cerebral arteries arise from the basilar artery and appear to be patent to their distal aspects. A right posterior communicating artery with smaller left posterior communicating  artery is present.  There is a 4-5 mm focal outpouching arising from the distal cavernous/supra clinoid right ICA near the expected takeoff of the right ophthalmic artery. Finding is suspicious for a possible small aneurysm (series 6, image 87). This is directed anteriorly and medially. No other definite aneurysm or vascular malformation identified on this motion degraded study.  IMPRESSION: MRI HEAD IMPRESSION:  1. No acute intracranial infarct or other abnormality identified. 2. Moderate generalized cerebral atrophy with mild chronic small vessel ischemic disease.  MRA HEAD IMPRESSION:  1. No large vessel occlusion or hemodynamically significant stenosis identified within the intracranial circulation. Please note that this study is moderately degraded by motion artifact. 2. 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 A short interval follow-up study is suggested to evaluate the stability of this finding.   Electronically Signed   By: Jeannine Boga M.D.   On: 10/28/2014 04:54  Mr Cervical Spine Wo Contrast  10/29/2014   CLINICAL DATA:  Initial evaluation for acute vertigo. History of degenerative disc disease.  EXAM: MRI CERVICAL SPINE WITHOUT CONTRAST  TECHNIQUE: Multiplanar, multisequence MR imaging of the cervical spine was performed. No intravenous contrast was administered.  COMPARISON:  Prior radiograph from 04/22/2009  FINDINGS: Study is moderately degraded by motion artifact.  Visualized portions of the brain and posterior fossa demonstrate a normal appearance with normal signal intensity. Craniocervical junction is widely patent.  There is reversal of the normal cervical lordosis with apex at C5. Trace anterolisthesis of C4 on C5 present. Trace retrolisthesis of C5 on C6.  Signal intensity within the vertebral body bone marrow is normal. No focal osseous lesion. No marrow edema.  Signal intensity  within the cervical spinal cord is normal.  Paraspinous soft tissues within normal limits. No prevertebral edema.  C2-C3: Mild bilateral uncovertebral spurring and facet arthrosis. No significant canal stenosis.  C3-C4: Posterior and right foraminal degenerative disc osteophyte present. Superimposed bilateral uncovertebral hypertrophy and right-sided facet arthrosis. There is resultant fairly severe right foraminal stenosis with probable encroachment of the exiting right C4 nerve root. Posterior disc osteophyte flattens and partially effaces the ventral thecal sac and results in mild to moderate canal stenosis. Moderate left foraminal narrowing present as well.  C4-C5: Trace anterolisthesis of C4 on C5. Diffuse degenerative disc osteophyte with bilateral uncovertebral hypertrophy. At least moderate bilateral foraminal stenosis. Posterior disc osteophyte largely effaces the ventral thecal sac and results in moderate canal narrowing.  C5-C6: Trace retrolisthesis of C5 on C6. There is diffuse degenerative disc osteophyte with bilateral uncovertebral hypertrophy. Posterior disc osteophyte flattens and effaces the ventral thecal sac and results in moderate canal stenosis. Severe left foraminal stenosis is present. Moderate to severe right foraminal narrowing present as well, although evaluation limited due to body habitus and motion artifact.  C6-C7: Diffuse degenerative disc osteophyte with intervertebral disc space narrowing and disc desiccation. Posterior disc osteophyte partially effaces the ventral thecal sac and results in mild to moderate canal narrowing. Moderate to severe bilateral foraminal stenosis present as well.  C7-T1: Diffuse degenerative disc osteophyte with intervertebral disc space narrowing and endplate changes. Posterior disc bulge flattens and partially effaces the ventral thecal sac and results in mild canal stenosis. Mild to moderate right with moderate left foraminal narrowing.  Degenerative disc  bulging present at T2-3 with resultant mild right foraminal narrowing. Partially visualized upper thoracic spine otherwise unremarkable.  IMPRESSION: Motion degraded study demonstrating reversal of the normal cervical lordosis with multilevel degenerative spondylolysis as detailed above. Degenerative changes most severe at the C5-6 level were there is moderate canal and fairly severe bilateral foraminal stenosis. Please see above report for a full description these findings.   Electronically Signed   By: Jeannine Boga M.D.   On: 10/29/2014 00:31   Dg Chest Port 1 View  10/28/2014   CLINICAL DATA:  74 year old male with hypoxia  EXAM: PORTABLE CHEST - 1 VIEW  COMPARISON:  Chest radiograph dated 06/12/2012  FINDINGS: Single-view of the chest demonstrate hypovolemic lungs with bibasilar subsegmental linear atelectasis. No focal consolidation. Stable cardiac silhouette. The osseous structures are grossly unremarkable.  IMPRESSION: No focal consolidation.   Electronically Signed   By: Anner Crete M.D.   On: 10/28/2014 00:28    Microbiology: No results found for this or any previous visit (from the past 240 hour(s)).   Labs: Basic Metabolic Panel:  Recent Labs Lab 10/27/14 2344 10/27/14 2350  NA 138 141  K 3.9 3.9  CL 103 103  CO2 25  --   GLUCOSE 205* 207*  BUN 18 20  CREATININE 0.71 0.70  CALCIUM 9.0  --    Liver Function Tests:  Recent Labs Lab 10/27/14 2344  AST 22  ALT 31  ALKPHOS 100  BILITOT 0.6  PROT 7.2  ALBUMIN 3.9   No results for input(s): LIPASE, AMYLASE in the last 168 hours. No results for input(s): AMMONIA in the last 168 hours. CBC:  Recent Labs Lab 10/27/14 2344 10/27/14 2350  WBC 11.0*  --   NEUTROABS 6.8  --   HGB 13.3 14.6  HCT 40.8 43.0  MCV 90.9  --   PLT 107*  --    Cardiac Enzymes: No results for input(s): CKTOTAL, CKMB, CKMBINDEX, TROPONINI in the last 168 hours. BNP: BNP (last 3 results) No results for input(s): BNP in the last  8760 hours.  ProBNP (last 3 results) No results for input(s): PROBNP in the last 8760 hours.  CBG:  Recent Labs Lab 10/28/14 0544  GLUCAP 137*       Signed:  Lelon Frohlich  [facility] Pager: [phone] 10/29/2014, 10:08 AM

## 2014-10-29 NOTE — Progress Notes (Addendum)
Patient being discharged home IV removed, patient is alert and oriented with no complaints of pain wife will take him home and be with him. Patient informed of medication changes and follow up appointments. Patient will be transported off floor to car in wheel chair.

## 2014-10-29 NOTE — Progress Notes (Signed)
PT Cancellation Note  Patient Details Name: Nathan Kemp MRN: [id number] DOB: [birth year]   Cancelled Treatment:    Reason Eval/Treat Not Completed: Patient at procedure or test/unavailable Pt undergoing echocardiograhm. Per nursing pt no longer dizzy and ambulating in room indep. Pt planned for d/c home today.   Kingsley Callander 10/29/2014, 3:15 PM  Kittie Plater, PT, DPT Pager #: [phone] Office #: [phone]

## 2014-10-29 NOTE — Progress Notes (Signed)
OT Cancellation Note  Patient Details Name: JURIEL CID MRN: [id number] DOB: 01-06-1941   Cancelled Treatment:    Reason Eval/Treat Not Completed: OT screened, no needs identified, will sign off. SPoke with patient and wife. Both agreeable to Screen.   Peri Maris  Pager: [phone]  10/29/2014, 9:48 AM

## 2014-10-29 NOTE — Progress Notes (Signed)
  Echocardiogram 2D Echocardiogram has been performed.  Nathan Kemp 10/29/2014, 5:07 PM

## 2014-10-29 NOTE — Progress Notes (Signed)
STROKE TEAM PROGRESS NOTE   HISTORY Nathan Kemp is an 74 y.o. male with a past medical history significant for HTN, hyperlipidemia, diet controlled DM, CAD s/p stenting, thrombocytopenia, vitamin D deficiency, DDD cervical spine, isolated peripheral vertigo 10 years ago, brought in by EMS due to acute onset of acute vertigo, diplopia, nausea, vomiting, dysarthria. Patient wife is at the bedside and stated that when she hot home found him very sweaty, complaining of not feeling feeling, nauseated, dizzy, and she noted that the left lips were down.Pt had a temporary dental crown placed today and took one dose of Lodine as prescribed this evening around 6 PM just after to dinner, and therefore wife and patient initially thought symptoms were side effects of Lodine. Nathan Kemp report a sensation of " things spinning around mainly when I try to move", double vision, and severe nausea. Denies HA, difficulty swallowing, focal weakness or numbness, slurred speech, language or vision impairment. No recent upper respiratory infection, fever, tinnitus, hearing loss, head or neck trauma. CT brain was personally reviewed and showed no acute abnormality. Platelet count pending. Slight leukocytosis. Na 146. ETOH Level <5  Date last known well: 10/27/14 Time last known well: 7:30 pm tPA Given: no, very mild deficits. NIHSS: 1 MRS: 0   SUBJECTIVE (INTERVAL HISTORY) No family is at the bedside.  Overall he feels his condition is  completely resolved. He was seen by occupational therapy yesterday and provocative manouvres   did not trigger any vertigo or nystagmus   OBJECTIVE Temp:  [97.8 F (36.6 C)-99 F (37.2 C)] 98.3 F (36.8 C) (07/13 1430) Pulse Rate:  [58-74] 74 (07/13 1430) Cardiac Rhythm:  [-] Normal sinus rhythm (07/13 0800) Resp:  [18-19] 18 (07/13 1430) BP: (108-152)/(61-83) 152/83 mmHg (07/13 1430) SpO2:  [95 %-98 %] 95 % (07/13 1430)   Recent Labs Lab 10/28/14 0544 10/29/14 1148   GLUCAP 137* 123*    Recent Labs Lab 10/27/14 2344 10/27/14 2350  NA 138 141  K 3.9 3.9  CL 103 103  CO2 25  --   GLUCOSE 205* 207*  BUN 18 20  CREATININE 0.71 0.70  CALCIUM 9.0  --     Recent Labs Lab 10/27/14 2344  AST 22  ALT 31  ALKPHOS 100  BILITOT 0.6  PROT 7.2  ALBUMIN 3.9    Recent Labs Lab 10/27/14 2344 10/27/14 2350  WBC 11.0*  --   NEUTROABS 6.8  --   HGB 13.3 14.6  HCT 40.8 43.0  MCV 90.9  --   PLT 107*  --    No results for input(s): CKTOTAL, CKMB, CKMBINDEX, TROPONINI in the last 168 hours.  Recent Labs  10/27/14 2344  LABPROT 14.8  INR 1.14    Recent Labs  10/28/14 0134  COLORURINE YELLOW  LABSPEC 1.027  PHURINE 5.0  GLUCOSEU NEGATIVE  HGBUR NEGATIVE  BILIRUBINUR LARGE*  KETONESUR NEGATIVE  PROTEINUR NEGATIVE  UROBILINOGEN 0.2  NITRITE NEGATIVE  LEUKOCYTESUR NEGATIVE       Component Value Date/Time   CHOL 89 10/29/2014 0512   TRIG 113 10/29/2014 0512   HDL 18* 10/29/2014 0512   CHOLHDL 4.9 10/29/2014 0512   VLDL 23 10/29/2014 0512   LDLCALC 48 10/29/2014 0512   No results found for: HGBA1C    Component Value Date/Time   LABOPIA NONE DETECTED 10/28/2014 0134   COCAINSCRNUR NONE DETECTED 10/28/2014 0134   LABBENZ NONE DETECTED 10/28/2014 0134   AMPHETMU NONE DETECTED 10/28/2014 0134   THCU NONE DETECTED  10/28/2014 0134   LABBARB NONE DETECTED 10/28/2014 0134     Recent Labs Lab 10/27/14 2343  ETH <5    Ct Head Wo Contrast 10/27/2014   No acute intracranial pathology.  Mild age-related atrophy and chronic microvascular ischemic disease.  If symptoms persist and there are no contraindications, MRI may provide better evaluation if clinically indicated    MRI HEAD  10/28/2014   IMPRESSION:  1. No acute intracranial infarct or other abnormality identified. 2. Moderate generalized cerebral atrophy with mild chronic small vessel ischemic disease.    MRA HEAD  10/28/2014   IMPRESSION:  1. No large vessel occlusion or  hemodynamically significant stenosis identified within the intracranial circulation. Please note that this study is moderately degraded by motion artifact. 2. 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 A short interval follow-up study is suggested to evaluate the stability of this finding.     Dg Chest Port 1 View 10/28/2014   No focal consolidation.     Carotid Doppler  There is 1-39% bilateral ICA stenosis. Vertebral artery flow is antegrade.     PHYSICAL EXAM Pleasant elderly obese Caucasian male currently not in distress. . Afebrile. Head is nontraumatic. Neck is supple without bruit.    Cardiac exam no murmur or gallop. Lungs are clear to auscultation. Distal pulses are well felt.\ Neurological Exam ;  Awake  Alert oriented x 3. Normal speech and language.eye movements full without nystagmus.fundi were not visualized. Vision acuity and fields appear normal. Hearing is normal. Palatal movements are normal. Face symmetric. Tongue midline. Head shaking did not produce any subjective vertigo or nystagmus. Hallpike maneuver was not done Normal strength, tone, reflexes and coordination. Normal sensation. Gait deferred.  ASSESSMENT/PLAN Nathan Kemp is a 74 y.o. male with history of HTN, hyperlipidemia, diet controlled DM, CAD s/p stenting, thrombocytopenia, vitamin D deficiency, DDD cervical spine, isolated peripheral vertigo 10 years ago presenting with acute vertigo, diplopia, nausea, vomiting, dysarthria. He did not receive IV t-PA due to very mild deficits.   Possible inner ear abnormality (doubt BPPV as not reproducible, has hx of labyrinthitis) vs TIA  MRI  No acute stroke  MRA  No large vessel stensosis. 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 PLT 107, ok to start low dose aspirin 81 mg daily for secondary stroke prevention  Ongoing aggressive stroke risk factor management  Therapy recommendations:  home  Disposition: home  Hypertension  Stable  Diabetes  HgbA1c pending , goal < 7.0  Other Stroke Risk Factors  Advanced age  Obesity, Body mass index is 31.85 kg/(m^2).   Family hx stroke (mother)  Coronary artery disease - stent 2006  Other Active Problems  Thrombocytopenia, etiology no clear, followed by Dr. Beryle Beams. PLT 107  Hypothyroidism  Polymyalgia on prednisone  GERD  Hospital day # [facility] See Amion for Pager information 10/29/2014 3:17 PM  I have personally examined this patient, reviewed notes, independently viewed imaging studies, participated in medical decision making and plan of care. I have made any additions or clarifications directly to the above note. Agree  with note above. He presented with transient vertigo, nausea, vomiting and ataxia lasting several hours which seems to have improved. This may represent posterior circulation TIA versus acute labyrintine dysfunction particularly since he had somewhat similar and mild episode 6-8 years ago. He remains at risk for neurological worsening, recurrent stroke, TIAs and needs ongoing stroke evaluation and aggressive risk factor control. Recommend aspirin 81 mg orally given history of thrombocytopenia.   Antony Contras, MD Medical Director [facility] Pager: [phone] 10/29/2014 3:17 PM    To contact Stroke Continuity provider, please refer to [email or web address] After hours, contact General Neurology

## 2014-11-03 DIAGNOSIS — L821 Other seborrheic keratosis: Secondary | ICD-10-CM | POA: Diagnosis not present

## 2014-11-03 DIAGNOSIS — L570 Actinic keratosis: Secondary | ICD-10-CM | POA: Diagnosis not present

## 2014-11-03 DIAGNOSIS — D692 Other nonthrombocytopenic purpura: Secondary | ICD-10-CM | POA: Diagnosis not present

## 2014-11-03 DIAGNOSIS — Z85828 Personal history of other malignant neoplasm of skin: Secondary | ICD-10-CM | POA: Diagnosis not present

## 2014-11-11 DIAGNOSIS — S81812A Laceration without foreign body, left lower leg, initial encounter: Secondary | ICD-10-CM | POA: Diagnosis not present

## 2014-11-11 DIAGNOSIS — R42 Dizziness and giddiness: Secondary | ICD-10-CM | POA: Diagnosis not present

## 2014-11-11 DIAGNOSIS — F801 Expressive language disorder: Secondary | ICD-10-CM | POA: Diagnosis not present

## 2014-11-11 DIAGNOSIS — Z6832 Body mass index (BMI) 32.0-32.9, adult: Secondary | ICD-10-CM | POA: Diagnosis not present

## 2014-11-11 DIAGNOSIS — M353 Polymyalgia rheumatica: Secondary | ICD-10-CM | POA: Diagnosis not present

## 2014-11-11 DIAGNOSIS — I10 Essential (primary) hypertension: Secondary | ICD-10-CM | POA: Diagnosis not present

## 2014-11-14 DIAGNOSIS — S81812D Laceration without foreign body, left lower leg, subsequent encounter: Secondary | ICD-10-CM | POA: Diagnosis not present

## 2014-11-14 DIAGNOSIS — Z6832 Body mass index (BMI) 32.0-32.9, adult: Secondary | ICD-10-CM | POA: Diagnosis not present

## 2014-11-19 DIAGNOSIS — Z6832 Body mass index (BMI) 32.0-32.9, adult: Secondary | ICD-10-CM | POA: Diagnosis not present

## 2014-11-19 DIAGNOSIS — Z5189 Encounter for other specified aftercare: Secondary | ICD-10-CM | POA: Diagnosis not present

## 2014-11-21 ENCOUNTER — Encounter (HOSPITAL_BASED_OUTPATIENT_CLINIC_OR_DEPARTMENT_OTHER): Payer: Medicare Other | Attending: Internal Medicine

## 2014-11-21 DIAGNOSIS — I2510 Atherosclerotic heart disease of native coronary artery without angina pectoris: Secondary | ICD-10-CM | POA: Diagnosis not present

## 2014-11-21 DIAGNOSIS — Z794 Long term (current) use of insulin: Secondary | ICD-10-CM | POA: Diagnosis not present

## 2014-11-21 DIAGNOSIS — M353 Polymyalgia rheumatica: Secondary | ICD-10-CM | POA: Insufficient documentation

## 2014-11-21 DIAGNOSIS — L97221 Non-pressure chronic ulcer of left calf limited to breakdown of skin: Secondary | ICD-10-CM | POA: Insufficient documentation

## 2014-11-21 DIAGNOSIS — E1140 Type 2 diabetes mellitus with diabetic neuropathy, unspecified: Secondary | ICD-10-CM | POA: Diagnosis not present

## 2014-11-21 DIAGNOSIS — E11622 Type 2 diabetes mellitus with other skin ulcer: Secondary | ICD-10-CM | POA: Insufficient documentation

## 2014-11-21 DIAGNOSIS — M1990 Unspecified osteoarthritis, unspecified site: Secondary | ICD-10-CM | POA: Insufficient documentation

## 2014-11-21 DIAGNOSIS — F419 Anxiety disorder, unspecified: Secondary | ICD-10-CM | POA: Insufficient documentation

## 2014-11-21 DIAGNOSIS — I10 Essential (primary) hypertension: Secondary | ICD-10-CM | POA: Diagnosis not present

## 2014-11-28 DIAGNOSIS — I10 Essential (primary) hypertension: Secondary | ICD-10-CM | POA: Diagnosis not present

## 2014-11-28 DIAGNOSIS — E11622 Type 2 diabetes mellitus with other skin ulcer: Secondary | ICD-10-CM | POA: Diagnosis not present

## 2014-11-28 DIAGNOSIS — L97221 Non-pressure chronic ulcer of left calf limited to breakdown of skin: Secondary | ICD-10-CM | POA: Diagnosis not present

## 2014-11-28 DIAGNOSIS — Z794 Long term (current) use of insulin: Secondary | ICD-10-CM | POA: Diagnosis not present

## 2014-11-28 DIAGNOSIS — I2510 Atherosclerotic heart disease of native coronary artery without angina pectoris: Secondary | ICD-10-CM | POA: Diagnosis not present

## 2014-11-28 DIAGNOSIS — M1990 Unspecified osteoarthritis, unspecified site: Secondary | ICD-10-CM | POA: Diagnosis not present

## 2014-12-05 DIAGNOSIS — L97221 Non-pressure chronic ulcer of left calf limited to breakdown of skin: Secondary | ICD-10-CM | POA: Diagnosis not present

## 2014-12-05 DIAGNOSIS — M1990 Unspecified osteoarthritis, unspecified site: Secondary | ICD-10-CM | POA: Diagnosis not present

## 2014-12-05 DIAGNOSIS — I10 Essential (primary) hypertension: Secondary | ICD-10-CM | POA: Diagnosis not present

## 2014-12-05 DIAGNOSIS — I2510 Atherosclerotic heart disease of native coronary artery without angina pectoris: Secondary | ICD-10-CM | POA: Diagnosis not present

## 2014-12-05 DIAGNOSIS — E11622 Type 2 diabetes mellitus with other skin ulcer: Secondary | ICD-10-CM | POA: Diagnosis not present

## 2014-12-05 DIAGNOSIS — Z794 Long term (current) use of insulin: Secondary | ICD-10-CM | POA: Diagnosis not present

## 2014-12-23 ENCOUNTER — Encounter (HOSPITAL_BASED_OUTPATIENT_CLINIC_OR_DEPARTMENT_OTHER): Payer: Medicare Other | Attending: General Surgery

## 2014-12-23 DIAGNOSIS — I739 Peripheral vascular disease, unspecified: Secondary | ICD-10-CM | POA: Diagnosis not present

## 2014-12-23 DIAGNOSIS — E1140 Type 2 diabetes mellitus with diabetic neuropathy, unspecified: Secondary | ICD-10-CM | POA: Diagnosis not present

## 2014-12-23 DIAGNOSIS — I10 Essential (primary) hypertension: Secondary | ICD-10-CM | POA: Insufficient documentation

## 2014-12-23 DIAGNOSIS — I2510 Atherosclerotic heart disease of native coronary artery without angina pectoris: Secondary | ICD-10-CM | POA: Insufficient documentation

## 2014-12-23 DIAGNOSIS — F419 Anxiety disorder, unspecified: Secondary | ICD-10-CM | POA: Diagnosis not present

## 2014-12-23 DIAGNOSIS — M1990 Unspecified osteoarthritis, unspecified site: Secondary | ICD-10-CM | POA: Diagnosis not present

## 2014-12-23 DIAGNOSIS — L97221 Non-pressure chronic ulcer of left calf limited to breakdown of skin: Secondary | ICD-10-CM | POA: Insufficient documentation

## 2014-12-23 DIAGNOSIS — I87332 Chronic venous hypertension (idiopathic) with ulcer and inflammation of left lower extremity: Secondary | ICD-10-CM | POA: Insufficient documentation

## 2014-12-29 DIAGNOSIS — R197 Diarrhea, unspecified: Secondary | ICD-10-CM | POA: Diagnosis not present

## 2014-12-30 DIAGNOSIS — I739 Peripheral vascular disease, unspecified: Secondary | ICD-10-CM | POA: Diagnosis not present

## 2014-12-30 DIAGNOSIS — I2510 Atherosclerotic heart disease of native coronary artery without angina pectoris: Secondary | ICD-10-CM | POA: Diagnosis not present

## 2014-12-30 DIAGNOSIS — I10 Essential (primary) hypertension: Secondary | ICD-10-CM | POA: Diagnosis not present

## 2014-12-30 DIAGNOSIS — L97221 Non-pressure chronic ulcer of left calf limited to breakdown of skin: Secondary | ICD-10-CM | POA: Diagnosis not present

## 2014-12-30 DIAGNOSIS — I87332 Chronic venous hypertension (idiopathic) with ulcer and inflammation of left lower extremity: Secondary | ICD-10-CM | POA: Diagnosis not present

## 2014-12-30 DIAGNOSIS — E1140 Type 2 diabetes mellitus with diabetic neuropathy, unspecified: Secondary | ICD-10-CM | POA: Diagnosis not present

## 2015-01-03 DIAGNOSIS — Z23 Encounter for immunization: Secondary | ICD-10-CM | POA: Diagnosis not present

## 2015-01-06 DIAGNOSIS — I739 Peripheral vascular disease, unspecified: Secondary | ICD-10-CM | POA: Diagnosis not present

## 2015-01-06 DIAGNOSIS — I10 Essential (primary) hypertension: Secondary | ICD-10-CM | POA: Diagnosis not present

## 2015-01-06 DIAGNOSIS — I2510 Atherosclerotic heart disease of native coronary artery without angina pectoris: Secondary | ICD-10-CM | POA: Diagnosis not present

## 2015-01-06 DIAGNOSIS — E1140 Type 2 diabetes mellitus with diabetic neuropathy, unspecified: Secondary | ICD-10-CM | POA: Diagnosis not present

## 2015-01-06 DIAGNOSIS — I87332 Chronic venous hypertension (idiopathic) with ulcer and inflammation of left lower extremity: Secondary | ICD-10-CM | POA: Diagnosis not present

## 2015-01-06 DIAGNOSIS — L97221 Non-pressure chronic ulcer of left calf limited to breakdown of skin: Secondary | ICD-10-CM | POA: Diagnosis not present

## 2015-01-12 DIAGNOSIS — M353 Polymyalgia rheumatica: Secondary | ICD-10-CM | POA: Diagnosis not present

## 2015-01-12 DIAGNOSIS — C8305 Small cell B-cell lymphoma, lymph nodes of inguinal region and lower limb: Secondary | ICD-10-CM | POA: Diagnosis not present

## 2015-01-12 DIAGNOSIS — F801 Expressive language disorder: Secondary | ICD-10-CM | POA: Diagnosis not present

## 2015-01-12 DIAGNOSIS — Z6831 Body mass index (BMI) 31.0-31.9, adult: Secondary | ICD-10-CM | POA: Diagnosis not present

## 2015-01-13 DIAGNOSIS — L97221 Non-pressure chronic ulcer of left calf limited to breakdown of skin: Secondary | ICD-10-CM | POA: Diagnosis not present

## 2015-01-13 DIAGNOSIS — I10 Essential (primary) hypertension: Secondary | ICD-10-CM | POA: Diagnosis not present

## 2015-01-13 DIAGNOSIS — I87332 Chronic venous hypertension (idiopathic) with ulcer and inflammation of left lower extremity: Secondary | ICD-10-CM | POA: Diagnosis not present

## 2015-01-13 DIAGNOSIS — E1140 Type 2 diabetes mellitus with diabetic neuropathy, unspecified: Secondary | ICD-10-CM | POA: Diagnosis not present

## 2015-01-13 DIAGNOSIS — I2510 Atherosclerotic heart disease of native coronary artery without angina pectoris: Secondary | ICD-10-CM | POA: Diagnosis not present

## 2015-01-13 DIAGNOSIS — I739 Peripheral vascular disease, unspecified: Secondary | ICD-10-CM | POA: Diagnosis not present

## 2015-01-26 DIAGNOSIS — M353 Polymyalgia rheumatica: Secondary | ICD-10-CM | POA: Diagnosis not present

## 2015-01-26 DIAGNOSIS — Z881 Allergy status to other antibiotic agents status: Secondary | ICD-10-CM | POA: Diagnosis not present

## 2015-01-26 DIAGNOSIS — I10 Essential (primary) hypertension: Secondary | ICD-10-CM | POA: Diagnosis not present

## 2015-01-26 DIAGNOSIS — E785 Hyperlipidemia, unspecified: Secondary | ICD-10-CM | POA: Diagnosis not present

## 2015-01-26 DIAGNOSIS — R471 Dysarthria and anarthria: Secondary | ICD-10-CM | POA: Diagnosis not present

## 2015-01-26 DIAGNOSIS — I2510 Atherosclerotic heart disease of native coronary artery without angina pectoris: Secondary | ICD-10-CM | POA: Diagnosis not present

## 2015-01-26 DIAGNOSIS — E039 Hypothyroidism, unspecified: Secondary | ICD-10-CM | POA: Diagnosis not present

## 2015-01-26 DIAGNOSIS — E119 Type 2 diabetes mellitus without complications: Secondary | ICD-10-CM | POA: Diagnosis not present

## 2015-01-26 DIAGNOSIS — Z79899 Other long term (current) drug therapy: Secondary | ICD-10-CM | POA: Diagnosis not present

## 2015-01-26 DIAGNOSIS — Z87891 Personal history of nicotine dependence: Secondary | ICD-10-CM | POA: Diagnosis not present

## 2015-02-02 DIAGNOSIS — J040 Acute laryngitis: Secondary | ICD-10-CM | POA: Diagnosis not present

## 2015-02-02 DIAGNOSIS — J301 Allergic rhinitis due to pollen: Secondary | ICD-10-CM | POA: Diagnosis not present

## 2015-02-02 DIAGNOSIS — R490 Dysphonia: Secondary | ICD-10-CM | POA: Diagnosis not present

## 2015-02-12 DIAGNOSIS — J040 Acute laryngitis: Secondary | ICD-10-CM | POA: Diagnosis not present

## 2015-02-16 DIAGNOSIS — H43813 Vitreous degeneration, bilateral: Secondary | ICD-10-CM | POA: Diagnosis not present

## 2015-02-16 DIAGNOSIS — E119 Type 2 diabetes mellitus without complications: Secondary | ICD-10-CM | POA: Diagnosis not present

## 2015-02-16 DIAGNOSIS — H11153 Pinguecula, bilateral: Secondary | ICD-10-CM | POA: Diagnosis not present

## 2015-02-16 DIAGNOSIS — H04123 Dry eye syndrome of bilateral lacrimal glands: Secondary | ICD-10-CM | POA: Diagnosis not present

## 2015-02-19 ENCOUNTER — Inpatient Hospital Stay (HOSPITAL_COMMUNITY)
Admission: AD | Admit: 2015-02-19 | Discharge: 2015-02-20 | DRG: 836 | Disposition: A | Discharge status: Other Acute Inpatient Hospital | Payer: Medicare Other | Source: Ambulatory Visit | Attending: Internal Medicine | Admitting: Internal Medicine

## 2015-02-19 ENCOUNTER — Ambulatory Visit (HOSPITAL_BASED_OUTPATIENT_CLINIC_OR_DEPARTMENT_OTHER): Payer: Medicare Other

## 2015-02-19 ENCOUNTER — Other Ambulatory Visit: Payer: Self-pay | Admitting: *Deleted

## 2015-02-19 ENCOUNTER — Ambulatory Visit (HOSPITAL_BASED_OUTPATIENT_CLINIC_OR_DEPARTMENT_OTHER): Payer: Medicare Other | Admitting: Oncology

## 2015-02-19 ENCOUNTER — Inpatient Hospital Stay (HOSPITAL_COMMUNITY): Payer: Medicare Other

## 2015-02-19 ENCOUNTER — Encounter (HOSPITAL_COMMUNITY): Payer: Self-pay | Admitting: Internal Medicine

## 2015-02-19 VITALS — BP 140/82 | HR 102 | Temp 98.6°F | Resp 18 | Ht 69.0 in | Wt 204.7 lb

## 2015-02-19 DIAGNOSIS — I491 Atrial premature depolarization: Secondary | ICD-10-CM | POA: Diagnosis not present

## 2015-02-19 DIAGNOSIS — Z6830 Body mass index (BMI) 30.0-30.9, adult: Secondary | ICD-10-CM | POA: Diagnosis not present

## 2015-02-19 DIAGNOSIS — K219 Gastro-esophageal reflux disease without esophagitis: Secondary | ICD-10-CM | POA: Diagnosis present

## 2015-02-19 DIAGNOSIS — C9200 Acute myeloblastic leukemia, not having achieved remission: Secondary | ICD-10-CM | POA: Diagnosis not present

## 2015-02-19 DIAGNOSIS — R609 Edema, unspecified: Secondary | ICD-10-CM

## 2015-02-19 DIAGNOSIS — R109 Unspecified abdominal pain: Secondary | ICD-10-CM | POA: Diagnosis not present

## 2015-02-19 DIAGNOSIS — D696 Thrombocytopenia, unspecified: Secondary | ICD-10-CM

## 2015-02-19 DIAGNOSIS — E785 Hyperlipidemia, unspecified: Secondary | ICD-10-CM | POA: Diagnosis present

## 2015-02-19 DIAGNOSIS — J45909 Unspecified asthma, uncomplicated: Secondary | ICD-10-CM | POA: Diagnosis present

## 2015-02-19 DIAGNOSIS — I10 Essential (primary) hypertension: Secondary | ICD-10-CM | POA: Diagnosis not present

## 2015-02-19 DIAGNOSIS — R079 Chest pain, unspecified: Secondary | ICD-10-CM | POA: Diagnosis not present

## 2015-02-19 DIAGNOSIS — E119 Type 2 diabetes mellitus without complications: Secondary | ICD-10-CM

## 2015-02-19 DIAGNOSIS — I890 Lymphedema, not elsewhere classified: Secondary | ICD-10-CM

## 2015-02-19 DIAGNOSIS — E039 Hypothyroidism, unspecified: Secondary | ICD-10-CM | POA: Diagnosis not present

## 2015-02-19 DIAGNOSIS — Z955 Presence of coronary angioplasty implant and graft: Secondary | ICD-10-CM

## 2015-02-19 DIAGNOSIS — R110 Nausea: Secondary | ICD-10-CM | POA: Diagnosis not present

## 2015-02-19 DIAGNOSIS — Z7952 Long term (current) use of systemic steroids: Secondary | ICD-10-CM

## 2015-02-19 DIAGNOSIS — Z794 Long term (current) use of insulin: Secondary | ICD-10-CM | POA: Diagnosis not present

## 2015-02-19 DIAGNOSIS — D72829 Elevated white blood cell count, unspecified: Secondary | ICD-10-CM | POA: Diagnosis present

## 2015-02-19 DIAGNOSIS — M353 Polymyalgia rheumatica: Secondary | ICD-10-CM | POA: Diagnosis present

## 2015-02-19 DIAGNOSIS — R5383 Other fatigue: Secondary | ICD-10-CM | POA: Diagnosis present

## 2015-02-19 DIAGNOSIS — D6949 Other primary thrombocytopenia: Secondary | ICD-10-CM | POA: Diagnosis present

## 2015-02-19 DIAGNOSIS — R162 Hepatomegaly with splenomegaly, not elsewhere classified: Secondary | ICD-10-CM | POA: Diagnosis not present

## 2015-02-19 DIAGNOSIS — Z79899 Other long term (current) drug therapy: Secondary | ICD-10-CM

## 2015-02-19 DIAGNOSIS — R000 Tachycardia, unspecified: Secondary | ICD-10-CM | POA: Diagnosis not present

## 2015-02-19 DIAGNOSIS — R42 Dizziness and giddiness: Secondary | ICD-10-CM | POA: Diagnosis not present

## 2015-02-19 DIAGNOSIS — I2510 Atherosclerotic heart disease of native coronary artery without angina pectoris: Secondary | ICD-10-CM | POA: Diagnosis present

## 2015-02-19 DIAGNOSIS — R0602 Shortness of breath: Secondary | ICD-10-CM

## 2015-02-19 DIAGNOSIS — C8305 Small cell B-cell lymphoma, lymph nodes of inguinal region and lower limb: Secondary | ICD-10-CM | POA: Diagnosis not present

## 2015-02-19 HISTORY — DX: Lymphedema, not elsewhere classified: I89.0

## 2015-02-19 HISTORY — DX: Polymyalgia rheumatica: M35.3

## 2015-02-19 LAB — CBC WITH DIFFERENTIAL/PLATELET
HCT: 41.5 % (ref 38.4–49.9)
HGB: 12.8 g/dL — ABNORMAL LOW (ref 13.0–17.1)
MCH: 27.4 pg (ref 27.2–33.4)
MCHC: 30.9 g/dL — ABNORMAL LOW (ref 32.0–36.0)
MCV: 88.9 fL (ref 79.3–98.0)
Platelets: 20 10*3/uL — ABNORMAL LOW (ref 140–400)
RBC: 4.67 10*6/uL (ref 4.20–5.82)
RDW: 17.9 % — AB (ref 11.0–14.6)
WBC: 116.1 10*3/uL (ref 4.0–10.3)

## 2015-02-19 LAB — DIC (DISSEMINATED INTRAVASCULAR COAGULATION)PANEL
D-Dimer, Quant: 5.05 ug/mL-FEU — ABNORMAL HIGH (ref 0.00–0.48)
INR: 1.2 (ref 0.00–1.49)
Platelets: 35 10*3/uL — ABNORMAL LOW (ref 150–400)
Smear Review: NONE SEEN

## 2015-02-19 LAB — COMPREHENSIVE METABOLIC PANEL
ALT: 33 U/L (ref 17–63)
ANION GAP: 9 (ref 5–15)
AST: 38 U/L (ref 15–41)
Albumin: 3.8 g/dL (ref 3.5–5.0)
Alkaline Phosphatase: 184 U/L — ABNORMAL HIGH (ref 38–126)
BUN: 13 mg/dL (ref 6–20)
CHLORIDE: 106 mmol/L (ref 101–111)
CO2: 28 mmol/L (ref 22–32)
CREATININE: 1.3 mg/dL — AB (ref 0.61–1.24)
Calcium: 10.2 mg/dL (ref 8.9–10.3)
GFR calc non Af Amer: 53 mL/min — ABNORMAL LOW (ref >60)
Glucose, Bld: 102 mg/dL — ABNORMAL HIGH (ref 65–99)
Potassium: 3.4 mmol/L — ABNORMAL LOW (ref 3.5–5.1)
Sodium: 143 mmol/L (ref 135–145)
Total Bilirubin: 1.2 mg/dL (ref 0.3–1.2)
Total Protein: 7 g/dL (ref 6.5–8.1)

## 2015-02-19 LAB — DIC (DISSEMINATED INTRAVASCULAR COAGULATION) PANEL
FIBRINOGEN: 166 mg/dL — AB (ref 204–475)
PROTHROMBIN TIME: 15.4 s — AB (ref 11.6–15.2)
aPTT: 48 seconds — ABNORMAL HIGH (ref 24–37)

## 2015-02-19 LAB — LACTATE DEHYDROGENASE: LDH: 464 U/L — ABNORMAL HIGH (ref 98–192)

## 2015-02-19 LAB — PHOSPHORUS: PHOSPHORUS: 2.1 mg/dL — AB (ref 2.5–4.6)

## 2015-02-19 LAB — MANUAL DIFFERENTIAL
ALC: 11.6 10*3/uL — ABNORMAL HIGH (ref 0.9–3.3)
ANC (CHCC manual diff): 41.8 10*3/uL — ABNORMAL HIGH (ref 1.5–6.5)
Blasts: 10 % — ABNORMAL HIGH (ref 0–0)
LYMPH: 10 % — AB (ref 14–49)
Metamyelocytes: 3 % — ABNORMAL HIGH (ref 0–0)
Myelocytes: 3 % — ABNORMAL HIGH (ref 0–0)
PLT EST: DECREASED
RBC Comments: NORMAL
SEG: 30 % — ABNORMAL LOW (ref 38–77)
nRBC: 2 % — ABNORMAL HIGH (ref 0–0)

## 2015-02-19 LAB — URIC ACID: URIC ACID, SERUM: 11.1 mg/dL — AB (ref 4.4–7.6)

## 2015-02-19 LAB — MAGNESIUM: Magnesium: 2 mg/dL (ref 1.7–2.4)

## 2015-02-19 LAB — TSH: TSH: 2.29 u[IU]/mL (ref 0.350–4.500)

## 2015-02-19 LAB — GLUCOSE, CAPILLARY: GLUCOSE-CAPILLARY: 93 mg/dL (ref 65–99)

## 2015-02-19 LAB — CHCC SMEAR

## 2015-02-19 MED ORDER — MECLIZINE HCL 25 MG PO TABS
25.0000 mg | ORAL_TABLET | Freq: Three times a day (TID) | ORAL | Status: DC | PRN
  Filled 2015-02-19: qty 1

## 2015-02-19 MED ORDER — POLYVINYL ALCOHOL 1.4 % OP SOLN
1.0000 [drp] | Freq: Every day | OPHTHALMIC | Status: DC
  Administered 2015-02-19: 1 [drp] via OPHTHALMIC
  Filled 2015-02-19: qty 15

## 2015-02-19 MED ORDER — ACETAMINOPHEN 325 MG PO TABS: 650.0000 mg | ORAL_TABLET | Freq: Four times a day (QID) | ORAL | Status: DC | PRN

## 2015-02-19 MED ORDER — POLYETHYL GLYCOL-PROPYL GLYCOL 0.4-0.3 % OP GEL
Freq: Every day | OPHTHALMIC | Status: DC
  Filled 2015-02-19: qty 10

## 2015-02-19 MED ORDER — SODIUM CHLORIDE 0.9 % IV SOLN
INTRAVENOUS | Status: DC
  Administered 2015-02-19: 22:00:00 via INTRAVENOUS
  Filled 2015-02-19 (×3): qty 1000

## 2015-02-19 MED ORDER — DICYCLOMINE HCL 20 MG PO TABS
20.0000 mg | ORAL_TABLET | Freq: Three times a day (TID) | ORAL | Status: DC
  Administered 2015-02-19 – 2015-02-20 (×3): 20 mg via ORAL
  Filled 2015-02-19 (×4): qty 1

## 2015-02-19 MED ORDER — PREDNISONE 5 MG PO TABS
10.0000 mg | ORAL_TABLET | Freq: Every day | ORAL | Status: DC
  Administered 2015-02-20: 10 mg via ORAL
  Filled 2015-02-19: qty 2

## 2015-02-19 MED ORDER — INSULIN ASPART 100 UNIT/ML ~~LOC~~ SOLN: 0.0000 [IU] | Freq: Three times a day (TID) | SUBCUTANEOUS | Status: DC

## 2015-02-19 MED ORDER — HYDROMORPHONE HCL 1 MG/ML IJ SOLN
1.0000 mg | INTRAMUSCULAR | Status: AC | PRN
  Administered 2015-02-19 – 2015-02-20 (×2): 1 mg via INTRAVENOUS
  Filled 2015-02-19 (×2): qty 1

## 2015-02-19 MED ORDER — PANTOPRAZOLE SODIUM 40 MG PO TBEC
40.0000 mg | DELAYED_RELEASE_TABLET | Freq: Every day | ORAL | Status: DC
  Administered 2015-02-20: 40 mg via ORAL
  Filled 2015-02-19: qty 1

## 2015-02-19 MED ORDER — ONDANSETRON HCL 4 MG/2ML IJ SOLN: 4.0000 mg | Freq: Four times a day (QID) | INTRAMUSCULAR | Status: DC | PRN

## 2015-02-19 MED ORDER — ZOLPIDEM TARTRATE 5 MG PO TABS
5.0000 mg | ORAL_TABLET | Freq: Every day | ORAL | Status: DC
  Administered 2015-02-19: 5 mg via ORAL
  Filled 2015-02-19: qty 1

## 2015-02-19 MED ORDER — ONDANSETRON HCL 4 MG PO TABS: 4.0000 mg | ORAL_TABLET | Freq: Four times a day (QID) | ORAL | Status: DC | PRN

## 2015-02-19 MED ORDER — ACETAMINOPHEN 650 MG RE SUPP: 650.0000 mg | Freq: Four times a day (QID) | RECTAL | Status: DC | PRN

## 2015-02-19 MED ORDER — CHOLESTYRAMINE 4 G PO PACK
4.0000 g | PACK | Freq: Every day | ORAL | Status: DC
  Administered 2015-02-20: 4 g via ORAL
  Filled 2015-02-19: qty 1

## 2015-02-19 MED ORDER — SORBITOL 70 % SOLN: 30.0000 mL | Freq: Every day | Status: DC | PRN

## 2015-02-19 MED ORDER — NITROGLYCERIN 0.4 MG SL SUBL: 0.4000 mg | SUBLINGUAL_TABLET | SUBLINGUAL | Status: DC | PRN

## 2015-02-19 MED ORDER — LEVOTHYROXINE SODIUM 100 MCG PO TABS
100.0000 ug | ORAL_TABLET | Freq: Every day | ORAL | Status: DC
  Administered 2015-02-20: 100 ug via ORAL
  Filled 2015-02-19: qty 1

## 2015-02-19 MED ORDER — ALBUTEROL SULFATE (2.5 MG/3ML) 0.083% IN NEBU: 2.5000 mg | INHALATION_SOLUTION | RESPIRATORY_TRACT | Status: DC | PRN

## 2015-02-19 MED ORDER — SENNOSIDES-DOCUSATE SODIUM 8.6-50 MG PO TABS: 1.0000 | ORAL_TABLET | Freq: Every evening | ORAL | Status: DC | PRN

## 2015-02-19 NOTE — Progress Notes (Signed)
[facility] OFFICE PROGRESS NOTE   Diagnosis: Severe thrombocytopenia, marked leukocytosis, hepatosplenomegaly  INTERVAL HISTORY:   [name] presents today for an unscheduled visit. He has a 10 day history of anorexia and night sweats. For the past 2 days he has developed abdominal distention and pain. He was seen by Dr. Reynaldo Minium this afternoon and noted to have a markedly elevated white count and low platelets. He was referred to the [facility] for further evaluation. He reports a nosebleed 2 evenings ago and easy bruising. He is nauseated. He was admitted to [facility] in July with vertigo felt to potentially be related to a reaction to Lodine. He has intermittent express of a fascia and has been evaluated by neurology at [facility].   Review of systems: Positives-anorexia, night sweats, easy bruising, abdominal pain and distention, intermittent expressive aphasia, increased leg edema for the past 2 days A complete review of systems was otherwise negative   Objective:  Vital signs in last 24 hours:  Blood pressure 140/82, pulse 102, temperature 98.6 F (37 C), temperature source Oral, resp. rate 18, height '5\' 9"'  (1.753 m), weight 204 lb 11.2 oz (92.851 kg), SpO2 98 %.    HEENT: Oropharynx without mass, thrush, or bleeding Lymphatics: No cervical, supraclavicular, axillary, or inguinal nodes. Prominent bilateral axillary fat pads. Resp: Bilateral in inspiratory wheeze at the posterior chest, no respiratory distress Cardio: Regular rate and rhythm GI: Marked hepatomegaly and splenomegaly with tenderness over the liver and spleen. No apparent ascites Vascular: Trace pitting edema at the right greater than left lower leg Neuro: Alert and oriented, the speech is fluent, the motor exam appears intact in the upper and lower extremities  Skin: Ecchymoses over the forearms, hyperpigmentation of the legs     Lab Results:  Lab Results  Component Value Date   WBC  116.1* 02/19/2015   HGB 12.8* 02/19/2015   HCT 41.5 02/19/2015   MCV 88.9 02/19/2015   PLT 20* 02/19/2015   NEUTROABS 6.8 10/27/2014   Blood smear: The platelets are markedly decreased in number, there is a profound leukocytosis. There are mature neutrophils and myelocytes. Many of the large leukocytes have monocytic features and some appear as blasts. A few of the blast appear to have Auer rods. There are a moderate number of nucleated red cells.    Medications: I have reviewed the patient's current medications.  Assessment/Plan: 1.History of splenomegaly - the spleen has been palpable on intermittent office visits in the past. Splenomegaly was confirmed on an abdominal ultrasound 06/14/2010.  2.history of Elevated liver enzymes.  3.History of Pancytopenia.  4.History of a remote Systemic illness characterized by intermittent fever, weight loss, and malaise 5.History of coronary artery disease.  6.Bone marrow biopsy 11/27/2009 at the [facility] and on 12/19/2009 at [facility] confirmed a hypercellular marrow with mild reticulin fibrosis and no evidence of malignancy.  7.Liver biopsy 02/10/2010 revealed minimal portal and lobular inflammation with evidence of steatosis.  8.Excisional biopsy of a right inguinal lymph node on 02/19/2010 at [facility] with the pathology revealing no evidence of a malignancy.  9.Hypothyroidism - maintained on thyroid hormone replacement.  10.Erectile dysfunction - the testosterone level was low 05/13/2010.  11.Hepatomegaly on the abdominal ultrasound 06/14/2010.  12.Skin hyperpigmentation over the extremities.  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  16.T11 compression fracture. Status post a kyphoplasty procedure Dr. Sherwood Gambler on 07/27/2011. The back pain has improved  17. PMR-maintained on prednisone and followed by Dr. Reynaldo Minium  18. 10 day history of anorexia, night sweats, with 2 days of abdominal pain-noted to have marked leukocytosis, severe thrombocytopenia, and hepatosplenomegaly on exam 02/19/2015    Disposition:  Nathan Kemp has a remote history of a poorly characterized systemic illness characterized by hepatosplenomegaly, cytopenias, lymphadenopathy, and elevated liver enzymes. An extensive diagnostic workup here, at [facility], and at the [facility] did not reveal a specific diagnosis. 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 He has been maintained on low-dose prednisone chronically for treatment of PMR.  Nathan Kemp presents today with an acute illness characterized by night sweats, anorexia, hepatosplenomegaly, and abdominal pain. He has profound leukocytosis and severe thrombocytopenia.  The differential diagnosis includes acute myelogenous leukemia, chronic myelogenous leukemia, and a circulating lymphoma. If he has CML he may be in accelerated or blast phase.  I will arrange for hospital admission for supportive care and diagnostic evaluation.  Approximately 40 minutes were spent with the patient today. The majority of the time was used for counseling and coordination of care.  Betsy Coder, MD  02/19/2015  5:25 PM

## 2015-02-19 NOTE — H&P (Signed)
[facility] History and Physical  Nathan Kemp QIH:[id number] DOB: 06/25/1940 DOA: 02/19/2015  Referring physician: Dr Benay Spice PCP: Geoffery Lyons, MD  Oncologist: Dr Benay Spice  Chief Complaint: fatigue/night sweats/anorexia/abn labs  HPI: Nathan Kemp is a 74 y.o. male  With history of left lower extremity lymphedema, hypothyroidism, borderline diabetes, coronary artery disease status post and placement in 2006, hypertension, thrombocytopenia, hyperlipidemia who presents from PCPs office secondary to markedly elevated leukocytosis and thrombocytopenia. Patient and wife states that over the past 10 days patient has had some generalized fatigue, decreased appetite, loss of taste, easy bruising, night sweats, chest pain associated with a cough, abdominal distention. Patient does endorse some left-sided abdominal pain as well as worsened over the past 1-2 days. Patient denies any fevers, no chills, no shortness of breath, no nausea, no vomiting, no diarrhea, no constipation, no dysuria, no melena, no hematemesis, no hematochezia, no cough. Patient states some worsening in his chronic right lower extremity swelling that he's had for years secondary to lymphedema. Patient does endorse some generalized weakness. Patient also endorses weight loss of 12 pounds over the past several months. Patient states he's been on chronic prednisone for his polymyalgia rheumatica. Patient also notes some nosebleed 2 nights prior to admission. Patient was seen at PCPs office and subsequently sent to the [facility] where he was seen by Dr. Benay Spice of oncology. Per oncology patient noted to have significant hepatosplenomegaly on examination, with a white count of 116.1 and a platelet count of 20. Oncologist looked at smear and due to concerns for an acute leukemoid versus CML with blast crisis versus a lymphoma, [facility] were called to admit the patient for further evaluation and  management.    Review of Systems: Per history of present illness otherwise negative. Constitutional:  No weight loss, night sweats, Fevers, chills, fatigue.  HEENT:  No headaches, Difficulty swallowing,Tooth/dental problems,Sore throat,  No sneezing, itching, ear ache, nasal congestion, post nasal drip,  Cardio-vascular:  No chest pain, Orthopnea, PND, swelling in lower extremities, anasarca, dizziness, palpitations  GI:  No heartburn, indigestion, abdominal pain, nausea, vomiting, diarrhea, change in bowel habits, loss of appetite  Resp:  No shortness of breath with exertion or at rest. No excess mucus, no productive cough, No non-productive cough, No coughing up of blood.No change in color of mucus.No wheezing.No chest wall deformity  Skin:  no rash or lesions.  GU:  no dysuria, change in color of urine, no urgency or frequency. No flank pain.  Musculoskeletal:  No joint pain or swelling. No decreased range of motion. No back pain.  Psych:  No change in mood or affect. No depression or anxiety. No memory loss.   Past Medical History  Diagnosis Date  . Anorexia   . Thrombocytopenia ([place])   . Fatigue   . Hypertension   . Hyperlipidemia   . Vitamin D deficiency   . Obesity   . DDD (degenerative disc disease), cervical   . Thyroid disease   . Trouble swallowing   . Cough   . Leg swelling     due to lymphedema  . Hypothyroidism   . Diabetes mellitus     borderline-controlled by diet  . Acute GI bleeding 04/16/2011  . Coronary artery disease     stent 06  . Heart murmur   . Asthma     when  have congstion  of chest  . Pneumonia   . Anemia   . Blood dyscrasia 06/2009    thrombocytopenia  .  Blood transfusion     platelets 12/12  . GERD (gastroesophageal reflux disease)   . 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 ED (erectile dysfunction)   . Polymyalgia ([place])   . IBS (irritable bowel syndrome)   . Essential hypertension   . Lymphedema: RLE 02/19/2015    Past Surgical History  Procedure Laterality Date  . Cardiac catheterization  01/31/2005    EF 60-65%  . Coronary angioplasty with stent placement  2006    STENTING OF HIS CORONARY ARTERY  . Appendectomy  1967  . US echocardiography  02/28/2005    EF 55-60%  . Tonsillectomy and adenoidectomy    . Nasal sinus surgery  2005  . Edg dilitation  [id number]  . Lymph node removal  2011    right groin  . Bone marrow biopsy      2011-at [facility], 01/2010-at [facility], 06/12-[facility]  . Lymph node biopsy  03/15/2011    Procedure: LYMPH NODE BIOPSY;  Surgeon: Earnstine Regal, MD;  Location: [facility] ORS;  Service: General;  Laterality: Right;  Right Anterior Cervical Lymph Node Excisional Biopsy   . 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 Cataract Bilateral    Social History:  reports that he has never smoked. He has never used smokeless tobacco. He reports that he does not drink alcohol or use illicit drugs.  Allergies  Allergen Reactions  . Cefdinir Hives  . Adhesive [Tape] Other (See Comments)    blisters  . Lodine [Etodolac]     Vertigo  . Lortab [Hydrocodone-Acetaminophen] Other (See Comments)    Makes patient very loopy. Update   Able to take now  . Niaspan [Niacin] Hives  . Clindamycin Nausea Only and Other (See Comments)    Stomach cramps  . Codeine Rash    Had rash previously but not recently-taken in cough medicine and no problems but patient can tolerate    Family History  Problem Relation Age of Onset  . Heart failure Father   . Hypertension Father   . Hearing loss Father 42    congestive heart failure  . Diabetes Father   . Hypertension Mother   . Stroke Mother 88   father deceased age 98 had a history of diabetes hypertension and peripheral vascular disease. Mother deceased age 88 from a fall and questionable acute CVA.  Prior to Admission medications   Medication Sig Start Date End Date Taking? Authorizing Provider   acetaminophen (TYLENOL ARTHRITIS PAIN) 650 MG CR tablet Take 650 mg by mouth 3 (three) times daily as needed for pain. Takes 650 mg tid-qid    Historical Provider, MD  Cholecalciferol (VITAMIN D-3 PO) Take 1 tablet by mouth daily.    Historical Provider, MD  cholestyramine Lucrezia Starch) 4 G packet Take 4 g by mouth daily.  03/18/13   Historical Provider, MD  dicyclomine (BENTYL) 20 MG tablet Take 20 mg by mouth 3 (three) times daily.  12/11/12   Historical Provider, MD  insulin regular (NOVOLIN R,HUMULIN R) 100 units/mL injection Inject 6 Units into the skin daily as needed for high blood sugar. For glucose greater than 175    Historical Provider, MD  levothyroxine (SYNTHROID, LEVOTHROID) 100 MCG tablet Take 100 mcg by mouth daily.    Historical Provider, MD  meclizine (ANTIVERT) 25 MG tablet Take 1 tablet (25 mg total) by mouth 3 (three) times daily as needed  for dizziness. 10/29/14   Erline Hau, MD  nitroGLYCERIN (NITROSTAT) 0.4 MG SL tablet PLACE 1 TABLET UNDER THE TONGUE EVERY 5 MINUTES AS NEEDED FOR CHEST PAIN 07/11/14   Thayer Headings, MD  olmesartan-hydrochlorothiazide (BENICAR HCT) 40-25 MG per tablet Take 0.5 tablets by mouth every morning.     Historical Provider, MD  Polyethyl Glycol-Propyl Glycol (SYSTANE ULTRA OP) Place 1-2 drops into both eyes 4 (four) times daily as needed (dry eyes).     Historical Provider, MD  Polyethyl Glycol-Propyl Glycol (SYSTANE) 0.4-0.3 % GEL Apply 1 Dose to eye at bedtime.    Historical Provider, MD  predniSONE (DELTASONE) 10 MG tablet Take 10 mg by mouth daily with breakfast.  07/25/14   Historical Provider, MD  zolpidem (AMBIEN) 10 MG tablet Take 10 mg by mouth at bedtime.     Historical Provider, MD   Physical Exam: Filed Vitals:   02/19/15 1815  BP: 147/79  Pulse: 93  Temp: 98.8 F (37.1 C)  TempSrc: Oral  Resp: 18  SpO2: 96%    Wt Readings from Last 3 Encounters:  02/19/15 92.851 kg (204 lb 11.2 oz)  10/28/14 97.886 kg (215 lb 12.8  oz)  08/18/14 97.206 kg (214 lb 4.8 oz)    General:  Elderly gentleman in no acute cardiopulmonary distress speaking in full sentences.  Eyes: PERRLA, EOMI, normal lids, irises & conjunctiva ENT: grossly normal hearing, lips & tongue Neck: no LAD, masses or thyromegaly Cardiovascular: RRR, no m/r/g. Left lower extremity with 1-2+ edema. Respiratory: CTA bilaterally, no w/r/r. Normal respiratory effort. Abdomen: soft, distended, tender to palpation in the right upper quadrant and left upper quadrant, significant hepatosplenomegaly, positive bowel sounds. Skin: Some areas of ecchymosis. Musculoskeletal: grossly normal tone BUE/BLE Psychiatric: grossly normal mood and affect, speech fluent and appropriate Neurologic: Alert and oriented 3. Cranial nerves II through XII grossly intact. Sensation is intact. Visual fields are intact. Gait not tested secondary to safety.           Labs on Admission:  Basic Metabolic Panel: No results for input(s): NA, K, CL, CO2, GLUCOSE, BUN, CREATININE, CALCIUM, MG, PHOS in the last 168 hours. Liver Function Tests: No results for input(s): AST, ALT, ALKPHOS, BILITOT, PROT, ALBUMIN in the last 168 hours. No results for input(s): LIPASE, AMYLASE in the last 168 hours. No results for input(s): AMMONIA in the last 168 hours. CBC:  Recent Labs Lab 02/19/15 1619  WBC 116.1*  HGB 12.8*  HCT 41.5  MCV 88.9  PLT 20*   Cardiac Enzymes: No results for input(s): CKTOTAL, CKMB, CKMBINDEX, TROPONINI in the last 168 hours.  BNP (last 3 results) No results for input(s): BNP in the last 8760 hours.  ProBNP (last 3 results) No results for input(s): PROBNP in the last 8760 hours.  CBG: No results for input(s): GLUCAP in the last 168 hours.  Radiological Exams on Admission: No results found.  EKG: None  Assessment/Plan Principal Problem:   Leukocytosis Active Problems:   HLD (hyperlipidemia)   Hypothyroidism   Thrombocytopenia (HCC)   Essential  hypertension   GERD (gastroesophageal reflux disease)   Diabetes mellitus without complication (HCC)   Hepatosplenomegaly   Lymphedema: RLE  #1 significant leukocytosis/thrombocytopenia/hepatosplenomegaly/constitutional symptoms Patient presenting with night sweats, fatigue, decreased appetite, significant hepatosplenomegaly, abdominal pain, significant leukocytosis and thrombocytopenia. Concerns for AML versus CML with an acute blast crisis versus lymphoma. Will admit patient to the MedSurg floor. Oncology is consulted on the patient and orders have been placed for  LDH, uric acid, DIC panel, comprehensive metabolic profile, bone marrow biopsy in the morning and smear to be reviewed per pathologist. Will check a UA with cultures and sensitivities. Check blood cultures 2. Check a haptoglobin. Check a chest x-ray. Place on IV fluids, antiemetics, supportive care. Oncology following and appreciate input and recommendations.  #2 right lower extremity lymphedema Chronic in nature however the patient and wife some increased swelling over the past couple of days. We'll get a venous Doppler to rule out DVT. Follow.  #3 borderline diabetes mellitus Check a hemoglobin A1c. Sliding scale insulin.  #4 hypothyroidism Check a TSH. Continue home dose Synthroid.  #5 gastroesophageal reflux disease PPI.  #6 hyperlipidemia Check a fasting lipid panel.  #7 PMR Continue home regimen prednisone.  #8 prophylaxis PPI for GI prophylaxis. SCDs for DVT prophylaxis.   Code Status: Full DVT Prophylaxis: SCDs. Family Communication: Updated patient and wife at bedside. Disposition Plan: Admit to [facility].  Time spent: 63 minutes  Verbon Giangregorio M.D. [facility] Pager [phone]

## 2015-02-20 ENCOUNTER — Other Ambulatory Visit (HOSPITAL_COMMUNITY): Payer: BLUE CROSS/BLUE SHIELD

## 2015-02-20 ENCOUNTER — Inpatient Hospital Stay (HOSPITAL_COMMUNITY): Payer: Medicare Other

## 2015-02-20 ENCOUNTER — Encounter (HOSPITAL_COMMUNITY): Payer: Self-pay | Admitting: Radiology

## 2015-02-20 DIAGNOSIS — E8770 Fluid overload, unspecified: Secondary | ICD-10-CM | POA: Diagnosis not present

## 2015-02-20 DIAGNOSIS — R140 Abdominal distension (gaseous): Secondary | ICD-10-CM | POA: Diagnosis not present

## 2015-02-20 DIAGNOSIS — E441 Mild protein-calorie malnutrition: Secondary | ICD-10-CM | POA: Diagnosis not present

## 2015-02-20 DIAGNOSIS — I82611 Acute embolism and thrombosis of superficial veins of right upper extremity: Secondary | ICD-10-CM | POA: Diagnosis not present

## 2015-02-20 DIAGNOSIS — D709 Neutropenia, unspecified: Secondary | ICD-10-CM | POA: Diagnosis not present

## 2015-02-20 DIAGNOSIS — C9200 Acute myeloblastic leukemia, not having achieved remission: Secondary | ICD-10-CM | POA: Diagnosis not present

## 2015-02-20 DIAGNOSIS — R109 Unspecified abdominal pain: Secondary | ICD-10-CM

## 2015-02-20 DIAGNOSIS — I358 Other nonrheumatic aortic valve disorders: Secondary | ICD-10-CM | POA: Diagnosis not present

## 2015-02-20 DIAGNOSIS — E883 Tumor lysis syndrome: Secondary | ICD-10-CM | POA: Diagnosis present

## 2015-02-20 DIAGNOSIS — E46 Unspecified protein-calorie malnutrition: Secondary | ICD-10-CM | POA: Diagnosis not present

## 2015-02-20 DIAGNOSIS — I119 Hypertensive heart disease without heart failure: Secondary | ICD-10-CM | POA: Diagnosis present

## 2015-02-20 DIAGNOSIS — J90 Pleural effusion, not elsewhere classified: Secondary | ICD-10-CM | POA: Diagnosis not present

## 2015-02-20 DIAGNOSIS — I517 Cardiomegaly: Secondary | ICD-10-CM | POA: Diagnosis present

## 2015-02-20 DIAGNOSIS — D693 Immune thrombocytopenic purpura: Secondary | ICD-10-CM | POA: Diagnosis present

## 2015-02-20 DIAGNOSIS — R509 Fever, unspecified: Secondary | ICD-10-CM | POA: Diagnosis not present

## 2015-02-20 DIAGNOSIS — R162 Hepatomegaly with splenomegaly, not elsewhere classified: Secondary | ICD-10-CM | POA: Diagnosis not present

## 2015-02-20 DIAGNOSIS — I2510 Atherosclerotic heart disease of native coronary artery without angina pectoris: Secondary | ICD-10-CM | POA: Diagnosis present

## 2015-02-20 DIAGNOSIS — R609 Edema, unspecified: Secondary | ICD-10-CM | POA: Diagnosis not present

## 2015-02-20 DIAGNOSIS — Z7289 Other problems related to lifestyle: Secondary | ICD-10-CM | POA: Diagnosis not present

## 2015-02-20 DIAGNOSIS — I4581 Long QT syndrome: Secondary | ICD-10-CM | POA: Diagnosis not present

## 2015-02-20 DIAGNOSIS — M810 Age-related osteoporosis without current pathological fracture: Secondary | ICD-10-CM | POA: Diagnosis present

## 2015-02-20 DIAGNOSIS — E119 Type 2 diabetes mellitus without complications: Secondary | ICD-10-CM | POA: Diagnosis present

## 2015-02-20 DIAGNOSIS — R21 Rash and other nonspecific skin eruption: Secondary | ICD-10-CM | POA: Diagnosis not present

## 2015-02-20 DIAGNOSIS — Z6830 Body mass index (BMI) 30.0-30.9, adult: Secondary | ICD-10-CM | POA: Diagnosis not present

## 2015-02-20 DIAGNOSIS — N179 Acute kidney failure, unspecified: Secondary | ICD-10-CM | POA: Diagnosis not present

## 2015-02-20 DIAGNOSIS — Z7952 Long term (current) use of systemic steroids: Secondary | ICD-10-CM | POA: Diagnosis not present

## 2015-02-20 DIAGNOSIS — R5081 Fever presenting with conditions classified elsewhere: Secondary | ICD-10-CM | POA: Diagnosis not present

## 2015-02-20 DIAGNOSIS — E440 Moderate protein-calorie malnutrition: Secondary | ICD-10-CM | POA: Diagnosis not present

## 2015-02-20 DIAGNOSIS — T451X5A Adverse effect of antineoplastic and immunosuppressive drugs, initial encounter: Secondary | ICD-10-CM | POA: Diagnosis not present

## 2015-02-20 DIAGNOSIS — Z955 Presence of coronary angioplasty implant and graft: Secondary | ICD-10-CM | POA: Diagnosis not present

## 2015-02-20 DIAGNOSIS — E039 Hypothyroidism, unspecified: Secondary | ICD-10-CM | POA: Diagnosis not present

## 2015-02-20 DIAGNOSIS — I5030 Unspecified diastolic (congestive) heart failure: Secondary | ICD-10-CM | POA: Diagnosis not present

## 2015-02-20 DIAGNOSIS — K219 Gastro-esophageal reflux disease without esophagitis: Secondary | ICD-10-CM | POA: Diagnosis present

## 2015-02-20 DIAGNOSIS — D72829 Elevated white blood cell count, unspecified: Secondary | ICD-10-CM | POA: Diagnosis not present

## 2015-02-20 DIAGNOSIS — M353 Polymyalgia rheumatica: Secondary | ICD-10-CM | POA: Diagnosis present

## 2015-02-20 DIAGNOSIS — N200 Calculus of kidney: Secondary | ICD-10-CM | POA: Diagnosis not present

## 2015-02-20 DIAGNOSIS — D61810 Antineoplastic chemotherapy induced pancytopenia: Secondary | ICD-10-CM | POA: Diagnosis not present

## 2015-02-20 DIAGNOSIS — K589 Irritable bowel syndrome without diarrhea: Secondary | ICD-10-CM | POA: Diagnosis present

## 2015-02-20 DIAGNOSIS — R4701 Aphasia: Secondary | ICD-10-CM | POA: Diagnosis present

## 2015-02-20 LAB — URINALYSIS, ROUTINE W REFLEX MICROSCOPIC
BILIRUBIN URINE: NEGATIVE
Glucose, UA: NEGATIVE mg/dL
KETONES UR: NEGATIVE mg/dL
LEUKOCYTES UA: NEGATIVE
NITRITE: NEGATIVE
PH: 6.5 (ref 5.0–8.0)
Protein, ur: 30 mg/dL — AB
Specific Gravity, Urine: 1.015 (ref 1.005–1.030)
Urobilinogen, UA: 0.2 mg/dL (ref 0.0–1.0)

## 2015-02-20 LAB — COMPREHENSIVE METABOLIC PANEL
ALBUMIN: 3.5 g/dL (ref 3.5–5.0)
ALK PHOS: 178 U/L — AB (ref 38–126)
ALT: 29 U/L (ref 17–63)
AST: 36 U/L (ref 15–41)
Anion gap: 7 (ref 5–15)
BILIRUBIN TOTAL: 1.1 mg/dL (ref 0.3–1.2)
BUN: 13 mg/dL (ref 6–20)
CALCIUM: 9.5 mg/dL (ref 8.9–10.3)
CO2: 29 mmol/L (ref 22–32)
Chloride: 108 mmol/L (ref 101–111)
Creatinine, Ser: 1.29 mg/dL — ABNORMAL HIGH (ref 0.61–1.24)
GFR calc Af Amer: >60 mL/min (ref >60)
GFR, EST NON AFRICAN AMERICAN: 53 mL/min — AB (ref >60)
GLUCOSE: 100 mg/dL — AB (ref 65–99)
Potassium: 3.6 mmol/L (ref 3.5–5.1)
Sodium: 144 mmol/L (ref 135–145)
TOTAL PROTEIN: 6.4 g/dL — AB (ref 6.5–8.1)

## 2015-02-20 LAB — CBC WITH DIFFERENTIAL/PLATELET
BAND NEUTROPHILS: 5 %
Basophils Absolute: 0 10*3/uL (ref 0.0–0.1)
Basophils Relative: 0 %
Blasts: 49 %
EOS PCT: 0 %
Eosinophils Absolute: 0 10*3/uL (ref 0.0–0.7)
HEMATOCRIT: 37.1 % — AB (ref 39.0–52.0)
Hemoglobin: 11.7 g/dL — ABNORMAL LOW (ref 13.0–17.0)
LYMPHS ABS: 6.2 10*3/uL — AB (ref 0.7–4.0)
Lymphocytes Relative: 5 %
MCH: 29.8 pg (ref 26.0–34.0)
MCHC: 31.5 g/dL (ref 30.0–36.0)
MCV: 94.4 fL (ref 78.0–100.0)
MONOS PCT: 16 %
Metamyelocytes Relative: 1 %
Monocytes Absolute: 19.8 10*3/uL — ABNORMAL HIGH (ref 0.1–1.0)
Myelocytes: 2 %
NEUTROS ABS: 37.2 10*3/uL — AB (ref 1.7–7.7)
Neutrophils Relative %: 22 %
Other: 0 %
Platelets: 22 10*3/uL — CL (ref 150–400)
Promyelocytes Absolute: 0 %
RBC: 3.93 MIL/uL — AB (ref 4.22–5.81)
RDW: 19.3 % — ABNORMAL HIGH (ref 11.5–15.5)
WBC: 124 10*3/uL (ref 4.0–10.5)
nRBC: 3 /100 WBC — ABNORMAL HIGH

## 2015-02-20 LAB — HAPTOGLOBIN: HAPTOGLOBIN: 76 mg/dL (ref 34–200)

## 2015-02-20 LAB — URINE MICROSCOPIC-ADD ON

## 2015-02-20 LAB — GLUCOSE, CAPILLARY
GLUCOSE-CAPILLARY: 88 mg/dL (ref 65–99)
GLUCOSE-CAPILLARY: 105 mg/dL — AB (ref 65–99)

## 2015-02-20 MED ORDER — METHOCARBAMOL 500 MG PO TABS
500.0000 mg | ORAL_TABLET | Freq: Three times a day (TID) | ORAL | Status: DC
  Administered 2015-02-20: 500 mg via ORAL
  Filled 2015-02-20: qty 1

## 2015-02-20 MED ORDER — ALLOPURINOL 300 MG PO TABS
300.0000 mg | ORAL_TABLET | Freq: Once | ORAL | Status: AC
  Administered 2015-02-20: 300 mg via ORAL
  Filled 2015-02-20: qty 1

## 2015-02-20 MED ORDER — HYDROXYUREA 500 MG PO CAPS
2000.0000 mg | ORAL_CAPSULE | Freq: Once | ORAL | Status: AC
  Administered 2015-02-20: 2000 mg via ORAL
  Filled 2015-02-20: qty 4

## 2015-02-20 MED ORDER — CETYLPYRIDINIUM CHLORIDE 0.05 % MT LIQD
7.0000 mL | Freq: Two times a day (BID) | OROMUCOSAL | Status: DC
  Administered 2015-02-20: 7 mL via OROMUCOSAL

## 2015-02-20 NOTE — Progress Notes (Signed)
PT Cancellation Note  Patient Details Name: Nathan Kemp MRN: [id number] DOB: [birth year]   Cancelled Treatment:    Reason Eval/Treat Not Completed: Patient not medically ready; Pt on bedrest and with doppler (to r/o DVT) pending--will attempt again as possible per medical     status and schedule permit.   [facility] 02/20/2015, 8:50 AM

## 2015-02-20 NOTE — Progress Notes (Signed)
IP PROGRESS NOTE  Subjective:   He continues to have abdominal pain, relieved with Dilaudid. No bleeding.  Objective: Vital signs in last 24 hours: Blood pressure 134/75, pulse 98, temperature 98.6 F (37 C), temperature source Oral, resp. rate 18, height 5' 9" (1.753 m), weight 203 lb 6.4 oz (92.262 kg), SpO2 90 %.  Intake/Output from previous day: 11/03 0701 - 11/04 0700 In: 1021.3 [P.O.:400; I.V.:621.3] Out: -   Physical Exam:  HEENT: No thrush or bleeding Lungs: Coarse rhonchi at the posterior basis, no respiratory distress Cardiac: Regular rate and rhythm Abdomen: Marked hepatosplenomegaly Extremities: Trace low pretibial edema bilaterally Skin: Ecchymoses at the forearms    Lab Results:  Recent Labs  02/19/15 1619 02/19/15 1930 02/20/15 0412  WBC 116.1*  --  124.0*  HGB 12.8*  --  11.7*  HCT 41.5  --  37.1*  PLT 20* 35* 22*    BMET  Recent Labs  02/19/15 1930 02/20/15 0412  NA 143 144  K 3.4* 3.6  CL 106 108  CO2 28 29  GLUCOSE 102* 100*  BUN 13 13  CREATININE 1.30* 1.29*  CALCIUM 10.2 9.5    Studies/Results: Portable Chest 1 View  02/19/2015  CLINICAL DATA:  74 year old male with a ten-day history of anorexia, night sweats, and a 2 day history of abdominal distention and pain EXAM: PORTABLE CHEST 1 VIEW COMPARISON:  Prior chest x-ray 10/27/2014 FINDINGS: Stable cardiac and mediastinal contours. Borderline cardiomegaly. Inspiratory volumes remain low. Chronic interstitial prominence and central bronchitic change are similar compared to prior. No focal airspace consolidation, pulmonary edema, pleural effusion or pneumothorax. Minimal linear atelectasis in the right lung base appears improved compared to prior. No focal bony abnormality. IMPRESSION: Stable chest x-ray without evidence of acute cardiopulmonary process. Electronically Signed   By: Jacqulynn Cadet M.D.   On: 02/19/2015 19:25    Medications: I have reviewed the patient's current  medications.  Assessment/Plan:  1. 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 History of elevated liver enzymes, pancytopenia, fever/weight loss in the remote past with no specific diagnosis made 3. Bone marrow biopsy 11/27/2009 at [facility] and on 12/19/2009 at [facility] confirmed a hypercellular marrow with mild reticulin fibrosis and no evidence of malignancy 4. Liver biopsy 02/10/2010 with minimal portal and lobular inflammation with evidence of steatosis 5. Excisional biopsy of a right inguinal lymph node 01/19/2010 at [facility] with no evidence of malignancy 6. Hypothyroidism 7. Erectile dysfunction 8. Chronic skin hyperpigmentation of the extremities 9. 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 History of a T11 compression fracture, status post a kyphoplasty procedure 07/27/2011 13. Polymyalgia rheumatica   [name] appears stable this morning. Dr. Gari Crown has reviewed the peripheral blood smear and feels the presentation is most consistent with acute myelogenous leukemia. I contacted Dr. Florene Glen on the leukemia service at [facility] and he agrees to accept Nathan Kemp in transfer. We will administer hydroxyurea and allopurinol prior to the transfer.  I will be available to help with his care after induction therapy.   LOS: 1 day   Sharra Cayabyab  02/20/2015, 9:33 AM

## 2015-02-20 NOTE — Discharge Summary (Signed)
Physician Discharge Summary  Nathan Kemp HQI:[id number] DOB: Aug 22, 1940 DOA: 02/19/2015  PCP: Geoffery Lyons, MD  Admit date: 02/19/2015 Discharge date: 02/20/2015  Time spent: 60 minutes  Recommendations for Outpatient Follow-up:  1. Patient will be discharged to [facility] to the care of Dr. Jerrye Noble of the leukemia service.  Discharge Diagnoses:  Principal Problem:   AML (acute myelogenous leukemia) ([place]) Active Problems:   Hepatosplenomegaly   HLD (hyperlipidemia)   Hypothyroidism   Thrombocytopenia (HCC)   Essential hypertension   GERD (gastroesophageal reflux disease)   Diabetes mellitus without complication (HCC)   Leukocytosis   Lymphedema: RLE   PMR (polymyalgia rheumatica) (HCC)   Discharge Condition: Stable  Diet recommendation: Regular  Filed Weights   02/19/15 2125 02/20/15 0446  Weight: 92.851 kg (204 lb 11.2 oz) 92.262 kg (203 lb 6.4 oz)    History of present illness:  Nathan Kemp is a 74 y.o. male  With history of left lower extremity lymphedema, hypothyroidism, borderline diabetes, coronary artery disease status post stent placement in 2006, hypertension, thrombocytopenia, hyperlipidemia who presents from PCPs office secondary to markedly elevated leukocytosis and thrombocytopenia.  Patient and wife stated that over the past 10 days prior to admission, patient has had some generalized fatigue, decreased appetite, loss of taste, easy bruising, night sweats, chest pain associated with a cough, abdominal distention. Patient did endorse some left-sided abdominal pain as well as worsened over the past 1-2 days. Patient denied any fevers, no chills, no shortness of breath, no nausea, no vomiting, no diarrhea, no constipation, no dysuria, no melena, no hematemesis, no hematochezia, no cough. Patient stated some worsening in his chronic right lower extremity swelling that he's had for years secondary to lymphedema.  Patient did endorse some generalized weakness. Patient also endorsed weight loss of 12 pounds over the past several months. Patient states he's been on chronic prednisone for his polymyalgia rheumatica. Patient also noted some nosebleed 2 nights prior to admission.  Patient was seen at PCPs office and subsequently sent to the [facility] where he was seen by Dr. Benay Spice of oncology. Per oncology patient noted to have significant hepatosplenomegaly on examination, with a white count of 116.1 and a platelet count of 20. Oncologist looked at smear and due to concerns for an acute leukemoid versus CML with blast crisis versus a lymphoma, [facility] were called to admit the patient for further evaluation and management.   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 Admission CBC obtained at the significant leukocytosis with a white count of 116.1 which increased to 124 on day of discharge. Patient was noted to also have a significant thrombocytopenia with a platelet count of 20 on admission. Patient was admitted oncology was consulted and followed the patient during the hospitalization labs were also ordered. DIC panel was obtained which was negative for schistocytes had a d-dimer 5.05 fibrinogen of 166 INR of 1.2 PT of 15.4 PTT of 48. Uric acid was elevated at 11.1. LDH was elevated at 464. Patient's peripheral smear was reviewed by hematologist with the pathologist Dr. Gari Crown and it was felt that patient's presentation was most consistent with acute myelogenous leukemia. Patient was also placed on pain management with some improvement with his abdominal pain. Patient was supposed to have a bone marrow done however this was canceled as patient  was being transferred to a tertiary care  center. Patient's leukocytosis went up to 124 on day of discharge. Dr. Benay Spice of oncology discussed patient's case with Dr. Florene Glen on the leukemia service at [facility] and Dr. Florene Glen agreed to accept the patient in transfer for further treatment and management of acute myelogenous leukemia. Patient was given a dose of allopurinol prior to transfer. Patient was also to be given a dose of hydroxyurea per oncology prior to transfer. Patient remained hemodynamically stable. Patient will be transferred to [facility] for further evaluation and management.  #2 right lower extremity lymphedema Patient noted to have chronic right lower extremity lymphedema however patient had had some increased swelling prior to admission. This will need to be evaluated on transfer. Patient may need lower extremity Dopplers to be done to rule out DVT.  #3 borderline diabetes mellitus Patient noted to have a hemoglobin A1c of 6.6 on 10/27/2014. Patient was maintained on a sliding scale insulin.  #4 hypothyroidism TSH obtained was 2.290. Patient was maintained on home regimen of Synthroid.  The rest of patient's chronic medical issues remain stable throughout the hospitalization and patient was discharged in stable condition.  Procedures:  Chest x-ray 02/19/2015    Consultations:  Oncology: Dr. Benay Spice 02/19/2015  Discharge Exam: Filed Vitals:   02/20/15 0446  BP: 134/75  Pulse: 98  Temp: 98.6 F (37 C)  Resp: 18    General: NAD Cardiovascular: RRR Respiratory: CTAB Abdomen: Soft/HSM/+BS/Some diffuse TTP   Discharge Instructions   Discharge Instructions    Diet general    Complete by:  As directed      Increase activity slowly    Complete by:  As directed           Current Discharge Medication List    CONTINUE these medications which have NOT CHANGED   Details  acetaminophen (TYLENOL ARTHRITIS PAIN) 650 MG CR tablet  Take 650 mg by mouth 3 (three) times daily as needed for pain. Takes 650 mg tid-qid   Associated Diagnoses: Thrombocytopenia, unspecified (HCC)    cholestyramine (QUESTRAN) 4 G packet Take 4 g by mouth daily.    Associated Diagnoses: Unspecified hypothyroidism; Malaise; Thrombocytopenia, unspecified (HCC)    dicyclomine (BENTYL) 20 MG tablet Take 20 mg by mouth 3 (three) times daily.    Associated Diagnoses: Thrombocytopenia, unspecified (HCC)    insulin regular (NOVOLIN R,HUMULIN R) 100 units/mL injection Inject 6 Units into the skin daily as needed for high blood sugar. For glucose greater than 175   Associated Diagnoses: Primary thrombocytopenia    levothyroxine (SYNTHROID, LEVOTHROID) 100 MCG tablet Take 100 mcg by mouth daily.    methocarbamol (ROBAXIN) 500 MG tablet Take 500 mg by mouth 3 (three) times daily.    nitroGLYCERIN (NITROSTAT) 0.4 MG SL tablet PLACE 1 TABLET UNDER THE TONGUE EVERY 5 MINUTES AS NEEDED FOR CHEST PAIN Qty: 25 tablet, Refills: 1    olmesartan-hydrochlorothiazide (BENICAR HCT) 40-25 MG per tablet Take 0.5 tablets by mouth every morning.     oxymetazoline (AFRIN) 0.05 % nasal spray Place 1 spray into both nostrils 2 (two) times daily as needed for congestion.    Polyethyl Glycol-Propyl Glycol (SYSTANE ULTRA OP) Place 1-2 drops into both eyes 4 (four) times daily as needed (dry eyes).     Polyethyl Glycol-Propyl Glycol (SYSTANE) 0.4-0.3 % GEL Apply 1 Dose to eye at bedtime.    predniSONE (DELTASONE) 10 MG tablet Take 10 mg by mouth daily  with breakfast.    Associated Diagnoses: Thrombocytopenia (HCC)    zolpidem (AMBIEN) 10 MG tablet Take 10 mg by mouth at bedtime.        Allergies  Allergen Reactions  . Cefdinir Hives  . Adhesive [Tape] Other (See Comments)    blisters  . Lodine [Etodolac]     Vertigo  . Lortab [Hydrocodone-Acetaminophen] Other (See Comments)    Makes patient very loopy. Update   Able to take now  . Niaspan [Niacin] Hives  .  Clindamycin Nausea Only and Other (See Comments)    Stomach cramps  . Codeine Rash    Had rash previously but not recently-taken in cough medicine and no problems but patient can tolerate   Follow-up Information    Follow up with Betsy Coder, MD.   Specialty:  Oncology   Why:  post discharge from [facility] information:   [place] [address] [phone]        The results of significant diagnostics from this hospitalization (including imaging, microbiology, ancillary and laboratory) are listed below for reference.    Significant Diagnostic Studies: Portable Chest 1 View  02/19/2015  CLINICAL DATA:  74 year old male with a ten-day history of anorexia, night sweats, and a 2 day history of abdominal distention and pain EXAM: PORTABLE CHEST 1 VIEW COMPARISON:  Prior chest x-ray 10/27/2014 FINDINGS: Stable cardiac and mediastinal contours. Borderline cardiomegaly. Inspiratory volumes remain low. Chronic interstitial prominence and central bronchitic change are similar compared to prior. No focal airspace consolidation, pulmonary edema, pleural effusion or pneumothorax. Minimal linear atelectasis in the right lung base appears improved compared to prior. No focal bony abnormality. IMPRESSION: Stable chest x-ray without evidence of acute cardiopulmonary process. Electronically Signed   By: Jacqulynn Cadet M.D.   On: 02/19/2015 19:25    Microbiology: No results found for this or any previous visit (from the past 240 hour(s)).   Labs: Basic Metabolic Panel:  Recent Labs Lab 02/19/15 1930 02/20/15 0412  NA 143 144  K 3.4* 3.6  CL 106 108  CO2 28 29  GLUCOSE 102* 100*  BUN 13 13  CREATININE 1.30* 1.29*  CALCIUM 10.2 9.5  MG 2.0  --   PHOS 2.1*  --    Liver Function Tests:  Recent Labs Lab 02/19/15 1930 02/20/15 0412  AST 38 36  ALT 33 29  ALKPHOS 184* 178*  BILITOT 1.2 1.1  PROT 7.0 6.4*  ALBUMIN 3.8 3.5   No results for input(s):  LIPASE, AMYLASE in the last 168 hours. No results for input(s): AMMONIA in the last 168 hours. CBC:  Recent Labs Lab 02/19/15 1619 02/19/15 1930 02/20/15 0412  WBC 116.1*  --  124.0*  NEUTROABS  --   --  37.2*  HGB 12.8*  --  11.7*  HCT 41.5  --  37.1*  MCV 88.9  --  94.4  PLT 20* 35* 22*   Cardiac Enzymes: No results for input(s): CKTOTAL, CKMB, CKMBINDEX, TROPONINI in the last 168 hours. BNP: BNP (last 3 results) No results for input(s): BNP in the last 8760 hours.  ProBNP (last 3 results) No results for input(s): PROBNP in the last 8760 hours.  CBG:  Recent Labs Lab 02/19/15 2112 02/20/15 0743  GLUCAP 93 88       Signed:  Zakiah Gauthreaux MD [facility] 02/20/2015, 10:36 AM

## 2015-02-20 NOTE — Progress Notes (Signed)
Patient ID: Nathan Kemp, male   DOB: Jun 30, 1940, 74 y.o.   MRN: [id number] BM biopsy originally scheduled for today has been cancelled by oncology. Pt awaiting transfer to [facility].

## 2015-02-20 NOTE — Progress Notes (Signed)
VASCULAR LAB PRELIMINARY  PRELIMINARY  PRELIMINARY  PRELIMINARY  Right lower extremity venous duplex completed.    Preliminary report:  Right:  No evidence of DVT, superficial thrombosis, or Baker's cyst.  Deajah Erkkila, RVT 02/20/2015, 12:05 PM

## 2015-02-20 NOTE — Progress Notes (Signed)
OT Cancellation Note  Patient Details Name: Nathan Kemp MRN: [id number] DOB: 1941-02-08   Cancelled Treatment:    Reason Eval/Treat Not Completed: Other (comment).  Spoke to [employer].  Plan is for pt to transfer to [facility] today.  If pt remains here, will check back.  Brance Dartt 02/20/2015, 3:25 PM  Lesle Chris, OTR/L [phone] 02/20/2015

## 2015-02-20 NOTE — Progress Notes (Signed)
Report called to Marzetta Board, RN at [facility] which will receive the patient. Patient stable from AM assessment and will be transported via [facility] to [facility].

## 2015-02-20 NOTE — Progress Notes (Signed)
CRITICAL VALUE ALERT  Critical value received: WBC 124, Platelets 22  Date of notification:  02/20/15  Time of notification: 1155  Critical value read back:yes  Nurse who received alert:  Azzie Glatter, RN  MD notified (1st page): N/A (lab values consistent with previous lab values)  Time of first page: n/a  MD notified (2nd page): n/a  Time of second page:n/a  Responding MD:  n/a  Time MD responded: n/a

## 2015-02-20 NOTE — H&P (Signed)
Chief Complaint: Patient was seen in consultation today for leukocytosis at the request of Dr. Benay Spice  Referring Physician(s): Dr. Benay Spice  History of Present Illness: Nathan Kemp is a 74 y.o. male who presented with abdominal pain, weight loss and nights sweats. Labs revealed leukocytosis, thrombocytopenia, hepatosplenomegaly. He has been seen by Dr. Benay Spice and IR received request for image guided bone marrow biopsy. He denies any chest pain, shortness of breath or palpitations. He denies any active signs of bleeding, but did have a recent nosebleed. The patient denies any history of sleep apnea or chronic oxygen use. He has previously tolerated sedation without complications. He does have a remote history of a poorly characterized systemic illness characterized by hepatosplenomegaly, cytopenias, lymphadenopathy, and elevated liver enzymes and history of ITP in 2012   Past Medical History  Diagnosis Date  . Anorexia   . Thrombocytopenia ([place])   . Fatigue   . Hypertension   . Hyperlipidemia   . Vitamin D deficiency   . Obesity   . DDD (degenerative disc disease), cervical   . Thyroid disease   . Trouble swallowing   . Cough   . Leg swelling     due to lymphedema  . Hypothyroidism   . Diabetes mellitus     borderline-controlled by diet  . Acute GI bleeding 04/16/2011  . Coronary artery disease     stent 06  . Heart murmur   . Asthma     when  have congstion  of chest  . Pneumonia   . Anemia   . Blood dyscrasia 06/2009    thrombocytopenia  . Blood transfusion     platelets 12/12  . GERD (gastroesophageal reflux disease)   . 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 ED (erectile dysfunction)   . Polymyalgia ([place])   . IBS (irritable bowel syndrome)   . Essential hypertension   . Lymphedema: RLE 02/19/2015  . PMR (polymyalgia rheumatica) ([place]) 02/19/2015    Past Surgical History  Procedure Laterality Date  . Cardiac catheterization  01/31/2005   EF 60-65%  . Coronary angioplasty with stent placement  2006    STENTING OF HIS CORONARY ARTERY  . Appendectomy  1967  . US echocardiography  02/28/2005    EF 55-60%  . Tonsillectomy and adenoidectomy    . Nasal sinus surgery  2005  . Edg dilitation  [id number]  . Lymph node removal  2011    right groin  . Bone marrow biopsy      2011-at [facility], 01/2010-at [place], 06/12-[facility]  . Lymph node biopsy  03/15/2011    Procedure: LYMPH NODE BIOPSY;  Surgeon: Earnstine Regal, MD;  Location: [facility] ORS;  Service: General;  Laterality: Right;  Right Anterior Cervical Lymph Node Excisional Biopsy   . 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 Cataract Bilateral     Allergies: Cefdinir; Adhesive; Lodine; Lortab; Niaspan; Clindamycin; and Codeine  Medications: Prior to Admission medications   Medication Sig Start Date End Date Taking? Authorizing Provider  acetaminophen (TYLENOL ARTHRITIS PAIN) 650 MG CR tablet Take 650 mg by mouth 3 (three) times daily as needed for pain. Takes 650 mg tid-qid   Yes Historical Provider, MD  cholestyramine Lucrezia Starch) 4 G packet Take 4 g by mouth daily.  03/18/13  Yes Historical Provider, MD  dicyclomine (BENTYL) 20 MG tablet Take 20 mg by  mouth 3 (three) times daily.  12/11/12  Yes Historical Provider, MD  insulin regular (NOVOLIN R,HUMULIN R) 100 units/mL injection Inject 6 Units into the skin daily as needed for high blood sugar. For glucose greater than 175   Yes Historical Provider, MD  levothyroxine (SYNTHROID, LEVOTHROID) 100 MCG tablet Take 100 mcg by mouth daily.   Yes Historical Provider, MD  methocarbamol (ROBAXIN) 500 MG tablet Take 500 mg by mouth 3 (three) times daily.   Yes Historical Provider, MD  nitroGLYCERIN (NITROSTAT) 0.4 MG SL tablet PLACE 1 TABLET UNDER THE TONGUE EVERY 5 MINUTES AS NEEDED FOR CHEST PAIN 07/11/14  Yes Thayer Headings, MD  olmesartan-hydrochlorothiazide (BENICAR HCT) 40-25 MG per  tablet Take 0.5 tablets by mouth every morning.    Yes Historical Provider, MD  oxymetazoline (AFRIN) 0.05 % nasal spray Place 1 spray into both nostrils 2 (two) times daily as needed for congestion.   Yes Historical Provider, MD  Polyethyl Glycol-Propyl Glycol (SYSTANE ULTRA OP) Place 1-2 drops into both eyes 4 (four) times daily as needed (dry eyes).    Yes Historical Provider, MD  Polyethyl Glycol-Propyl Glycol (SYSTANE) 0.4-0.3 % GEL Apply 1 Dose to eye at bedtime.   Yes Historical Provider, MD  predniSONE (DELTASONE) 10 MG tablet Take 10 mg by mouth daily with breakfast.  07/25/14  Yes Historical Provider, MD  zolpidem (AMBIEN) 10 MG tablet Take 10 mg by mouth at bedtime.    Yes Historical Provider, MD     Family History  Problem Relation Age of Onset  . Heart failure Father   . Hypertension Father   . Hearing loss Father 92    congestive heart failure  . Diabetes Father   . Hypertension Mother   . Stroke Mother 5    Social History   Social History  . Marital Status: Married    Spouse Name: N/A  . Number of Children: N/A  . Years of Education: N/A   Social History Main Topics  . Smoking status: Never Smoker   . Smokeless tobacco: Never Used  . Alcohol Use: No  . Drug Use: No  . Sexual Activity: Not Currently   Other Topics Concern  . None   Social History Narrative    Review of Systems: A 12 point ROS discussed and pertinent positives are indicated in the HPI above.  All other systems are negative.  Review of Systems  Vital Signs: BP 134/75 mmHg  Pulse 98  Temp(Src) 98.6 F (37 C) (Oral)  Resp 18  Ht _0  (1.753 m)  Wt 203 lb 6.4 oz (92.262 kg)  BMI 30.02 kg/m2  SpO2 90%  Physical Exam  Constitutional: He is oriented to person, place, and time. No distress.  HENT:  Head: Normocephalic and atraumatic.  Cardiovascular: Normal rate and regular rhythm.  Exam reveals no gallop and no friction rub.   No murmur heard. Pulmonary/Chest: Effort normal and  breath sounds normal. No respiratory distress. He has no wheezes. He has no rales.  Abdominal: He exhibits no distension. There is no tenderness.  Neurological: He is alert and oriented to person, place, and time.  Skin: Skin is warm and dry. He is not diaphoretic.    Mallampati Score:  MD Evaluation Airway: WNL Heart: WNL Abdomen: WNL Chest/ Lungs: WNL ASA  Classification: 3 Mallampati/Airway Score: Two  Imaging: Portable Chest 1 View  02/19/2015  CLINICAL DATA:  74 year old male with a ten-day history of anorexia, night sweats, and a 2 day history of  abdominal distention and pain EXAM: PORTABLE CHEST 1 VIEW COMPARISON:  Prior chest x-ray 10/27/2014 FINDINGS: Stable cardiac and mediastinal contours. Borderline cardiomegaly. Inspiratory volumes remain low. Chronic interstitial prominence and central bronchitic change are similar compared to prior. No focal airspace consolidation, pulmonary edema, pleural effusion or pneumothorax. Minimal linear atelectasis in the right lung base appears improved compared to prior. No focal bony abnormality. IMPRESSION: Stable chest x-ray without evidence of acute cardiopulmonary process. Electronically Signed   By: Jacqulynn Cadet M.D.   On: 02/19/2015 19:25    Labs:  CBC:  Recent Labs  08/18/14 0819 10/27/14 2344 10/27/14 2350 02/19/15 1619 02/19/15 1930 02/20/15 0412  WBC 8.7 11.0*  --  116.1*  --  124.0*  HGB 14.5 13.3 14.6 12.8*  --  11.7*  HCT 43.9 40.8 43.0 41.5  --  37.1*  PLT 111* 107*  --  20* 35* 22*    COAGS:  Recent Labs  10/27/14 2344 02/19/15 1930  INR 1.14 1.20  APTT 35 48*    BMP:  Recent Labs  10/27/14 2344 10/27/14 2350 02/19/15 1930 02/20/15 0412  NA 138 141 143 144  K 3.9 3.9 3.4* 3.6  CL 103 103 106 108  CO2 25  --  28 29  GLUCOSE 205* 207* 102* 100*  BUN _0 CALCIUM 9.0  --  10.2 9.5  CREATININE 0.71 0.70 1.30* 1.29*  GFRNONAA >60  --  53* 53*  GFRAA >60  --  >60 >60    LIVER  FUNCTION TESTS:  Recent Labs  10/27/14 2344 02/19/15 1930 02/20/15 0412  BILITOT 0.6 1.2 1.1  AST 22 38 36  ALT 31 33 29  ALKPHOS 100 184* 178*  PROT 7.2 7.0 6.4*  ALBUMIN 3.9 3.8 3.5    Assessment and Plan: Leukocytosis  Thrombocytopenia Hepatosplenomegaly  Seen by Dr. Benay Spice Request for image guided bone marrow biopsy with sedation The patient has been NPO, no blood thinners taken, labs and vitals have been reviewed. Risks and Benefits discussed with the patient including, but not limited to bleeding, infection, damage to adjacent structures or low yield requiring additional tests. All of the patient's questions were answered, patient is agreeable to proceed. Consent signed and in chart. Remote history of a poorly characterized systemic illness characterized by hepatosplenomegaly, cytopenias, lymphadenopathy, and elevated liver enzymes.  History of ITP 2012   Thank you for this interesting consult.  I greatly enjoyed meeting Nathan Kemp and look forward to participating in their care.  A copy of this report was sent to the requesting provider on this date.  SignedHedy Jacob 02/20/2015, 9:08 AM   I spent a total of 20 Minutes in face to face in clinical consultation, greater than 50% of which was counseling/coordinating care for leukocytosis

## 2015-02-21 LAB — URINE CULTURE: Culture: NO GROWTH

## 2015-02-23 LAB — HEMOGLOBIN A1C
HEMOGLOBIN A1C: 6.6 % — AB (ref 4.8–5.6)
Mean Plasma Glucose: 143 mg/dL

## 2015-02-24 ENCOUNTER — Other Ambulatory Visit: Payer: BLUE CROSS/BLUE SHIELD

## 2015-02-24 ENCOUNTER — Ambulatory Visit: Payer: BLUE CROSS/BLUE SHIELD | Admitting: Oncology

## 2015-02-24 LAB — CULTURE, BLOOD (ROUTINE X 2)
Culture: NO GROWTH
Culture: NO GROWTH

## 2015-03-09 ENCOUNTER — Ambulatory Visit: Payer: BLUE CROSS/BLUE SHIELD | Admitting: Oncology

## 2015-03-25 ENCOUNTER — Telehealth: Payer: Self-pay | Admitting: Oncology

## 2015-03-25 ENCOUNTER — Other Ambulatory Visit: Payer: Self-pay | Admitting: *Deleted

## 2015-03-25 DIAGNOSIS — D696 Thrombocytopenia, unspecified: Secondary | ICD-10-CM

## 2015-03-25 DIAGNOSIS — C9200 Acute myeloblastic leukemia, not having achieved remission: Secondary | ICD-10-CM

## 2015-03-25 NOTE — Telephone Encounter (Signed)
per pof to sch pt appts-cld pt and gave pt time & date of 12/8 appt-pt picked same time each week @ 9-pt will pick up updated copy of sch on 12/8

## 2015-03-26 ENCOUNTER — Other Ambulatory Visit (HOSPITAL_BASED_OUTPATIENT_CLINIC_OR_DEPARTMENT_OTHER): Payer: Medicare Other

## 2015-03-26 ENCOUNTER — Telehealth: Payer: Self-pay | Admitting: *Deleted

## 2015-03-26 DIAGNOSIS — D696 Thrombocytopenia, unspecified: Secondary | ICD-10-CM | POA: Diagnosis present

## 2015-03-26 DIAGNOSIS — C9200 Acute myeloblastic leukemia, not having achieved remission: Secondary | ICD-10-CM

## 2015-03-26 LAB — CBC WITH DIFFERENTIAL/PLATELET
BASO%: 0 % (ref 0.0–2.0)
Basophils Absolute: 0 10*3/uL (ref 0.0–0.1)
EOS%: 0 % (ref 0.0–7.0)
Eosinophils Absolute: 0 10*3/uL (ref 0.0–0.5)
HCT: 36.8 % — ABNORMAL LOW (ref 38.4–49.9)
HEMOGLOBIN: 12.2 g/dL — AB (ref 13.0–17.1)
LYMPH#: 0.5 10*3/uL — AB (ref 0.9–3.3)
LYMPH%: 8.1 % — ABNORMAL LOW (ref 14.0–49.0)
MCH: 29.5 pg (ref 27.2–33.4)
MCHC: 33.2 g/dL (ref 32.0–36.0)
MCV: 88.9 fL (ref 79.3–98.0)
MONO#: 5 10*3/uL — AB (ref 0.1–0.9)
MONO%: 76.1 % — ABNORMAL HIGH (ref 0.0–14.0)
NEUT%: 15.8 % — AB (ref 39.0–75.0)
NEUTROS ABS: 1 10*3/uL — AB (ref 1.5–6.5)
NRBC: 0 % (ref 0–0)
Platelets: 40 10*3/uL — ABNORMAL LOW (ref 140–400)
RBC: 4.14 10*6/uL — ABNORMAL LOW (ref 4.20–5.82)
RDW: 17.4 % — AB (ref 11.0–14.6)
WBC: 6.6 10*3/uL (ref 4.0–10.3)

## 2015-03-26 LAB — COMPREHENSIVE METABOLIC PANEL
ALBUMIN: 3.3 g/dL — AB (ref 3.5–5.0)
ALK PHOS: 360 U/L — AB (ref 40–150)
ALT: 26 U/L (ref 0–55)
AST: 22 U/L (ref 5–34)
Anion Gap: 12 mEq/L — ABNORMAL HIGH (ref 3–11)
BILIRUBIN TOTAL: 0.85 mg/dL (ref 0.20–1.20)
BUN: 11.9 mg/dL (ref 7.0–26.0)
CO2: 24 mEq/L (ref 22–29)
CREATININE: 0.8 mg/dL (ref 0.7–1.3)
Calcium: 9.7 mg/dL (ref 8.4–10.4)
Chloride: 100 mEq/L (ref 98–109)
EGFR: 88 mL/min/{1.73_m2} — ABNORMAL LOW (ref >90)
GLUCOSE: 155 mg/dL — AB (ref 70–140)
POTASSIUM: 3.9 meq/L (ref 3.5–5.1)
SODIUM: 136 meq/L (ref 136–145)
Total Protein: 7.9 g/dL (ref 6.4–8.3)

## 2015-03-26 LAB — MAGNESIUM: MAGNESIUM: 2 mg/dL (ref 1.5–2.5)

## 2015-03-26 NOTE — Telephone Encounter (Signed)
Per orders; faxed today's lab results to [facility]

## 2015-03-30 ENCOUNTER — Other Ambulatory Visit (HOSPITAL_BASED_OUTPATIENT_CLINIC_OR_DEPARTMENT_OTHER): Payer: Medicare Other

## 2015-03-30 ENCOUNTER — Telehealth: Payer: Self-pay | Admitting: *Deleted

## 2015-03-30 DIAGNOSIS — D696 Thrombocytopenia, unspecified: Secondary | ICD-10-CM

## 2015-03-30 DIAGNOSIS — C9200 Acute myeloblastic leukemia, not having achieved remission: Secondary | ICD-10-CM

## 2015-03-30 LAB — COMPREHENSIVE METABOLIC PANEL
ALBUMIN: 3.3 g/dL — AB (ref 3.5–5.0)
ALK PHOS: 323 U/L — AB (ref 40–150)
ALT: 36 U/L (ref 0–55)
AST: 29 U/L (ref 5–34)
Anion Gap: 11 mEq/L (ref 3–11)
BUN: 16.5 mg/dL (ref 7.0–26.0)
CALCIUM: 9.4 mg/dL (ref 8.4–10.4)
CHLORIDE: 101 meq/L (ref 98–109)
CO2: 24 mEq/L (ref 22–29)
CREATININE: 0.9 mg/dL (ref 0.7–1.3)
EGFR: 86 mL/min/{1.73_m2} — ABNORMAL LOW (ref >90)
Glucose: 220 mg/dl — ABNORMAL HIGH (ref 70–140)
Potassium: 4.3 mEq/L (ref 3.5–5.1)
Sodium: 136 mEq/L (ref 136–145)
Total Bilirubin: 0.82 mg/dL (ref 0.20–1.20)
Total Protein: 7.4 g/dL (ref 6.4–8.3)

## 2015-03-30 LAB — MANUAL DIFFERENTIAL
ALC: 0.6 10*3/uL — AB (ref 0.9–3.3)
ANC (CHCC manual diff): 2.1 10*3/uL (ref 1.5–6.5)
Blasts: 2 % — ABNORMAL HIGH (ref 0–0)
LYMPH: 12 % — ABNORMAL LOW (ref 14–49)
METAMYELOCYTES PCT: 1 % — AB (ref 0–0)
MONO: 44 % — AB (ref 0–14)
Myelocytes: 1 % — ABNORMAL HIGH (ref 0–0)
PLT EST: DECREASED
RBC COMMENTS: NORMAL
SEG: 40 % (ref 38–77)

## 2015-03-30 LAB — CBC WITH DIFFERENTIAL/PLATELET
HEMATOCRIT: 36.8 % — AB (ref 38.4–49.9)
HEMOGLOBIN: 12 g/dL — AB (ref 13.0–17.1)
MCH: 29.6 pg (ref 27.2–33.4)
MCHC: 32.6 g/dL (ref 32.0–36.0)
MCV: 90.6 fL (ref 79.3–98.0)
PLATELETS: 76 10*3/uL — AB (ref 140–400)
RBC: 4.06 10*6/uL — ABNORMAL LOW (ref 4.20–5.82)
RDW: 19.4 % — ABNORMAL HIGH (ref 11.0–14.6)
WBC: 5.1 10*3/uL (ref 4.0–10.3)

## 2015-03-30 LAB — MAGNESIUM: MAGNESIUM: 1.8 mg/dL (ref 1.5–2.5)

## 2015-03-30 NOTE — Telephone Encounter (Signed)
Message from pt's wife asking what his neutrophil count was. Returned call, they had a copy of lab but was couldn't find neutrophils. Reviewed lab with wife, she voiced understanding.

## 2015-04-02 ENCOUNTER — Other Ambulatory Visit (HOSPITAL_BASED_OUTPATIENT_CLINIC_OR_DEPARTMENT_OTHER): Payer: Medicare Other

## 2015-04-02 DIAGNOSIS — C9200 Acute myeloblastic leukemia, not having achieved remission: Secondary | ICD-10-CM

## 2015-04-02 DIAGNOSIS — D696 Thrombocytopenia, unspecified: Secondary | ICD-10-CM

## 2015-04-02 LAB — COMPREHENSIVE METABOLIC PANEL
ALK PHOS: 311 U/L — AB (ref 40–150)
ALT: 48 U/L (ref 0–55)
AST: 35 U/L — ABNORMAL HIGH (ref 5–34)
Albumin: 3.4 g/dL — ABNORMAL LOW (ref 3.5–5.0)
Anion Gap: 9 mEq/L (ref 3–11)
BILIRUBIN TOTAL: 0.9 mg/dL (ref 0.20–1.20)
BUN: 12.9 mg/dL (ref 7.0–26.0)
CO2: 27 mEq/L (ref 22–29)
Calcium: 9.8 mg/dL (ref 8.4–10.4)
Chloride: 101 mEq/L (ref 98–109)
Creatinine: 0.8 mg/dL (ref 0.7–1.3)
EGFR: 88 mL/min/{1.73_m2} — AB (ref >90)
Glucose: 166 mg/dl — ABNORMAL HIGH (ref 70–140)
POTASSIUM: 4.5 meq/L (ref 3.5–5.1)
SODIUM: 137 meq/L (ref 136–145)
Total Protein: 7.5 g/dL (ref 6.4–8.3)

## 2015-04-02 LAB — CBC WITH DIFFERENTIAL/PLATELET
BASO%: 0.2 % (ref 0.0–2.0)
BASOS ABS: 0 10*3/uL (ref 0.0–0.1)
EOS%: 0.2 % (ref 0.0–7.0)
Eosinophils Absolute: 0 10*3/uL (ref 0.0–0.5)
HCT: 36.8 % — ABNORMAL LOW (ref 38.4–49.9)
HGB: 11.9 g/dL — ABNORMAL LOW (ref 13.0–17.1)
LYMPH%: 26.9 % (ref 14.0–49.0)
MCH: 30.1 pg (ref 27.2–33.4)
MCHC: 32.3 g/dL (ref 32.0–36.0)
MCV: 92.9 fL (ref 79.3–98.0)
MONO#: 2 10*3/uL — ABNORMAL HIGH (ref 0.1–0.9)
MONO%: 41.1 % — AB (ref 0.0–14.0)
NEUT#: 1.5 10*3/uL (ref 1.5–6.5)
NEUT%: 31.6 % — AB (ref 39.0–75.0)
NRBC: 0 % (ref 0–0)
Platelets: 65 10*3/uL — ABNORMAL LOW (ref 140–400)
RBC: 3.96 10*6/uL — AB (ref 4.20–5.82)
RDW: 20.8 % — AB (ref 11.0–14.6)
WBC: 4.9 10*3/uL (ref 4.0–10.3)
lymph#: 1.3 10*3/uL (ref 0.9–3.3)

## 2015-04-02 LAB — MAGNESIUM: MAGNESIUM: 2.1 mg/dL (ref 1.5–2.5)

## 2015-04-03 DIAGNOSIS — G4700 Insomnia, unspecified: Secondary | ICD-10-CM | POA: Diagnosis not present

## 2015-04-03 DIAGNOSIS — E119 Type 2 diabetes mellitus without complications: Secondary | ICD-10-CM | POA: Diagnosis not present

## 2015-04-03 DIAGNOSIS — Z7952 Long term (current) use of systemic steroids: Secondary | ICD-10-CM | POA: Diagnosis not present

## 2015-04-03 DIAGNOSIS — C9200 Acute myeloblastic leukemia, not having achieved remission: Secondary | ICD-10-CM | POA: Diagnosis not present

## 2015-04-03 DIAGNOSIS — I10 Essential (primary) hypertension: Secondary | ICD-10-CM | POA: Diagnosis not present

## 2015-04-03 DIAGNOSIS — Z79899 Other long term (current) drug therapy: Secondary | ICD-10-CM | POA: Diagnosis not present

## 2015-04-03 DIAGNOSIS — M353 Polymyalgia rheumatica: Secondary | ICD-10-CM | POA: Diagnosis not present

## 2015-04-03 DIAGNOSIS — E039 Hypothyroidism, unspecified: Secondary | ICD-10-CM | POA: Diagnosis not present

## 2015-04-03 DIAGNOSIS — R791 Abnormal coagulation profile: Secondary | ICD-10-CM | POA: Diagnosis not present

## 2015-04-03 DIAGNOSIS — D696 Thrombocytopenia, unspecified: Secondary | ICD-10-CM | POA: Diagnosis not present

## 2015-04-03 DIAGNOSIS — D649 Anemia, unspecified: Secondary | ICD-10-CM | POA: Diagnosis not present

## 2015-04-03 DIAGNOSIS — I2510 Atherosclerotic heart disease of native coronary artery without angina pectoris: Secondary | ICD-10-CM | POA: Diagnosis not present

## 2015-04-03 DIAGNOSIS — E876 Hypokalemia: Secondary | ICD-10-CM | POA: Diagnosis not present

## 2015-04-06 ENCOUNTER — Other Ambulatory Visit (HOSPITAL_BASED_OUTPATIENT_CLINIC_OR_DEPARTMENT_OTHER): Payer: Medicare Other

## 2015-04-06 DIAGNOSIS — D696 Thrombocytopenia, unspecified: Secondary | ICD-10-CM | POA: Diagnosis present

## 2015-04-06 DIAGNOSIS — C9200 Acute myeloblastic leukemia, not having achieved remission: Secondary | ICD-10-CM

## 2015-04-06 LAB — COMPREHENSIVE METABOLIC PANEL
ALBUMIN: 3.4 g/dL — AB (ref 3.5–5.0)
ALT: 55 U/L (ref 0–55)
ANION GAP: 9 meq/L (ref 3–11)
AST: 41 U/L — ABNORMAL HIGH (ref 5–34)
Alkaline Phosphatase: 295 U/L — ABNORMAL HIGH (ref 40–150)
BILIRUBIN TOTAL: 1.05 mg/dL (ref 0.20–1.20)
BUN: 10.7 mg/dL (ref 7.0–26.0)
CALCIUM: 9.5 mg/dL (ref 8.4–10.4)
CO2: 28 meq/L (ref 22–29)
CREATININE: 0.8 mg/dL (ref 0.7–1.3)
Chloride: 100 mEq/L (ref 98–109)
EGFR: 89 mL/min/{1.73_m2} — ABNORMAL LOW (ref >90)
Glucose: 159 mg/dl — ABNORMAL HIGH (ref 70–140)
Potassium: 4.2 mEq/L (ref 3.5–5.1)
Sodium: 137 mEq/L (ref 136–145)
TOTAL PROTEIN: 7.3 g/dL (ref 6.4–8.3)

## 2015-04-06 LAB — CBC WITH DIFFERENTIAL/PLATELET
BASO%: 0 % (ref 0.0–2.0)
BASOS ABS: 0 10*3/uL (ref 0.0–0.1)
EOS ABS: 0 10*3/uL (ref 0.0–0.5)
EOS%: 0.4 % (ref 0.0–7.0)
HEMATOCRIT: 36.2 % — AB (ref 38.4–49.9)
HEMOGLOBIN: 11.7 g/dL — AB (ref 13.0–17.1)
LYMPH%: 34.5 % (ref 14.0–49.0)
MCH: 30.6 pg (ref 27.2–33.4)
MCHC: 32.3 g/dL (ref 32.0–36.0)
MCV: 94.8 fL (ref 79.3–98.0)
MONO#: 1.9 10*3/uL — ABNORMAL HIGH (ref 0.1–0.9)
MONO%: 39.9 % — AB (ref 0.0–14.0)
NEUT#: 1.2 10*3/uL — ABNORMAL LOW (ref 1.5–6.5)
NEUT%: 25.2 % — ABNORMAL LOW (ref 39.0–75.0)
NRBC: 0 % (ref 0–0)
PLATELETS: 62 10*3/uL — AB (ref 140–400)
RBC: 3.82 10*6/uL — ABNORMAL LOW (ref 4.20–5.82)
RDW: 23 % — AB (ref 11.0–14.6)
WBC: 4.7 10*3/uL (ref 4.0–10.3)
lymph#: 1.6 10*3/uL (ref 0.9–3.3)

## 2015-04-06 LAB — MAGNESIUM: MAGNESIUM: 2.1 mg/dL (ref 1.5–2.5)

## 2015-04-07 DIAGNOSIS — D696 Thrombocytopenia, unspecified: Secondary | ICD-10-CM | POA: Diagnosis not present

## 2015-04-07 DIAGNOSIS — E119 Type 2 diabetes mellitus without complications: Secondary | ICD-10-CM | POA: Diagnosis not present

## 2015-04-07 DIAGNOSIS — C9200 Acute myeloblastic leukemia, not having achieved remission: Secondary | ICD-10-CM | POA: Diagnosis not present

## 2015-04-07 DIAGNOSIS — E039 Hypothyroidism, unspecified: Secondary | ICD-10-CM | POA: Diagnosis not present

## 2015-04-07 DIAGNOSIS — Z7952 Long term (current) use of systemic steroids: Secondary | ICD-10-CM | POA: Diagnosis not present

## 2015-04-07 DIAGNOSIS — Z79899 Other long term (current) drug therapy: Secondary | ICD-10-CM | POA: Diagnosis not present

## 2015-04-07 DIAGNOSIS — I10 Essential (primary) hypertension: Secondary | ICD-10-CM | POA: Diagnosis not present

## 2015-04-07 DIAGNOSIS — Z452 Encounter for adjustment and management of vascular access device: Secondary | ICD-10-CM | POA: Diagnosis not present

## 2015-04-07 DIAGNOSIS — Z888 Allergy status to other drugs, medicaments and biological substances status: Secondary | ICD-10-CM | POA: Diagnosis not present

## 2015-04-08 ENCOUNTER — Encounter (HOSPITAL_COMMUNITY): Payer: Self-pay

## 2015-04-08 ENCOUNTER — Emergency Department (HOSPITAL_COMMUNITY)
Admission: EM | Admit: 2015-04-08 | Discharge: 2015-04-09 | Disposition: A | Discharge status: Home / Self Care | Payer: Medicare Other | Attending: Emergency Medicine | Admitting: Emergency Medicine

## 2015-04-08 ENCOUNTER — Emergency Department (HOSPITAL_COMMUNITY): Payer: Medicare Other

## 2015-04-08 DIAGNOSIS — S81811A Laceration without foreign body, right lower leg, initial encounter: Secondary | ICD-10-CM | POA: Insufficient documentation

## 2015-04-08 DIAGNOSIS — E785 Hyperlipidemia, unspecified: Secondary | ICD-10-CM | POA: Insufficient documentation

## 2015-04-08 DIAGNOSIS — I10 Essential (primary) hypertension: Secondary | ICD-10-CM | POA: Insufficient documentation

## 2015-04-08 DIAGNOSIS — Y9289 Other specified places as the place of occurrence of the external cause: Secondary | ICD-10-CM | POA: Insufficient documentation

## 2015-04-08 DIAGNOSIS — Z862 Personal history of diseases of the blood and blood-forming organs and certain disorders involving the immune mechanism: Secondary | ICD-10-CM | POA: Diagnosis not present

## 2015-04-08 DIAGNOSIS — I2510 Atherosclerotic heart disease of native coronary artery without angina pectoris: Secondary | ICD-10-CM | POA: Diagnosis not present

## 2015-04-08 DIAGNOSIS — W010XXA Fall on same level from slipping, tripping and stumbling without subsequent striking against object, initial encounter: Secondary | ICD-10-CM | POA: Diagnosis not present

## 2015-04-08 DIAGNOSIS — Z8719 Personal history of other diseases of the digestive system: Secondary | ICD-10-CM | POA: Diagnosis not present

## 2015-04-08 DIAGNOSIS — E039 Hypothyroidism, unspecified: Secondary | ICD-10-CM | POA: Diagnosis not present

## 2015-04-08 DIAGNOSIS — R011 Cardiac murmur, unspecified: Secondary | ICD-10-CM | POA: Insufficient documentation

## 2015-04-08 DIAGNOSIS — Z9861 Coronary angioplasty status: Secondary | ICD-10-CM | POA: Diagnosis not present

## 2015-04-08 DIAGNOSIS — Y9389 Activity, other specified: Secondary | ICD-10-CM | POA: Diagnosis not present

## 2015-04-08 DIAGNOSIS — Z79899 Other long term (current) drug therapy: Secondary | ICD-10-CM | POA: Diagnosis not present

## 2015-04-08 DIAGNOSIS — Z87438 Personal history of other diseases of male genital organs: Secondary | ICD-10-CM | POA: Insufficient documentation

## 2015-04-08 DIAGNOSIS — M5030 Other cervical disc degeneration, unspecified cervical region: Secondary | ICD-10-CM | POA: Insufficient documentation

## 2015-04-08 DIAGNOSIS — Z9889 Other specified postprocedural states: Secondary | ICD-10-CM | POA: Insufficient documentation

## 2015-04-08 DIAGNOSIS — Z7952 Long term (current) use of systemic steroids: Secondary | ICD-10-CM | POA: Diagnosis not present

## 2015-04-08 DIAGNOSIS — E669 Obesity, unspecified: Secondary | ICD-10-CM | POA: Diagnosis not present

## 2015-04-08 DIAGNOSIS — Z8701 Personal history of pneumonia (recurrent): Secondary | ICD-10-CM | POA: Diagnosis not present

## 2015-04-08 DIAGNOSIS — Y998 Other external cause status: Secondary | ICD-10-CM | POA: Diagnosis not present

## 2015-04-08 DIAGNOSIS — Z794 Long term (current) use of insulin: Secondary | ICD-10-CM | POA: Insufficient documentation

## 2015-04-08 DIAGNOSIS — S0990XA Unspecified injury of head, initial encounter: Secondary | ICD-10-CM | POA: Diagnosis not present

## 2015-04-08 DIAGNOSIS — G253 Myoclonus: Secondary | ICD-10-CM | POA: Diagnosis not present

## 2015-04-08 DIAGNOSIS — R5383 Other fatigue: Secondary | ICD-10-CM | POA: Diagnosis not present

## 2015-04-08 DIAGNOSIS — S199XXA Unspecified injury of neck, initial encounter: Secondary | ICD-10-CM | POA: Diagnosis not present

## 2015-04-08 DIAGNOSIS — S8991XA Unspecified injury of right lower leg, initial encounter: Secondary | ICD-10-CM | POA: Diagnosis present

## 2015-04-08 DIAGNOSIS — IMO0002 Reserved for concepts with insufficient information to code with codable children: Secondary | ICD-10-CM

## 2015-04-08 MED ORDER — LIDOCAINE-EPINEPHRINE-TETRACAINE (LET) SOLUTION
3.0000 mL | Freq: Once | NASAL | Status: AC
  Administered 2015-04-09: 3 mL via TOPICAL
  Filled 2015-04-08: qty 3

## 2015-04-08 NOTE — ED Provider Notes (Signed)
CSN: [id number]     Arrival date & time 04/08/15  2202 History  By signing my name below, I, Evelene Croon, attest that this documentation has been prepared under the direction and in the presence of Nell Schrack, MD . Electronically Signed: Evelene Croon, Scribe. 04/09/2015. 1:14 AM.    Chief Complaint  Patient presents with  . Extremity Laceration    Patient is a 74 y.o. male presenting with skin laceration. The history is provided by the patient and the spouse. No language interpreter was used.  Laceration Location:  Leg Leg laceration location:  R lower leg Depth:  Through dermis Bleeding: controlled   Laceration mechanism:  Fall Pain details:    Quality:  Aching   Severity:  Moderate   Timing:  Constant   Progression:  Unchanged Relieved by:  None tried   HPI Comments:  Nathan Kemp is a 74 y.o. male with a history of leukemia, who presents to the Emergency Department complaining of laceration to his RLE with moderate pain sustained s/p fall this evening. Per wife pt slipped and fell onto his back with his leg brushing against the carpet. Pt denies LOC but is unsure of head injury. Wife states she did not clean the wound but tried to control the bleeding. She notes pt is currently on prednisone and finished chemo yesterday. Pt has no other acute complaints at this time.    [facility]- Oncologist  Pt has an appointment on 04/09/15 at 0900 to have blood work done.   Past Medical History  Diagnosis Date  . Anorexia   . Thrombocytopenia ([place])   . Fatigue   . Hypertension   . Hyperlipidemia   . Vitamin D deficiency   . Obesity   . DDD (degenerative disc disease), cervical   . Thyroid disease   . Trouble swallowing   . Cough   . Leg swelling     due to lymphedema  . Hypothyroidism   . Diabetes mellitus     borderline-controlled by diet  . Acute GI bleeding 04/16/2011  . Coronary artery disease     stent 06  . Heart murmur   . Asthma     when  have congstion   of chest  . Pneumonia   . Anemia   . Blood dyscrasia 06/2009    thrombocytopenia  . Blood transfusion     platelets 12/12  . GERD (gastroesophageal reflux disease)   . 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 ED (erectile dysfunction)   . Polymyalgia ([place])   . IBS (irritable bowel syndrome)   . Essential hypertension   . Lymphedema: RLE 02/19/2015  . PMR (polymyalgia rheumatica) ([place]) 02/19/2015   Past Surgical History  Procedure Laterality Date  . Cardiac catheterization  01/31/2005    EF 60-65%  . Coronary angioplasty with stent placement  2006    STENTING OF HIS CORONARY ARTERY  . Appendectomy  1967  . US echocardiography  02/28/2005    EF 55-60%  . Tonsillectomy and adenoidectomy    . Nasal sinus surgery  2005  . Edg dilitation  [id number]  . Lymph node removal  2011    right groin  . Bone marrow biopsy      2011-at [facility], 01/2010-at [facility], 06/12-[facility]  . Lymph node biopsy  03/15/2011    Procedure: LYMPH NODE BIOPSY;  Surgeon: Earnstine Regal, MD;  Location: [facility] ORS;  Service: General;  Laterality: Right;  Right Anterior  Cervical Lymph Node Excisional Biopsy   . 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 Cataract Bilateral    Family History  Problem Relation Age of Onset  . Heart failure Father   . Hypertension Father   . Hearing loss Father 31    congestive heart failure  . Diabetes Father   . Hypertension Mother   . Stroke Mother 81   Social History  Substance Use Topics  . Smoking status: Never Smoker   . Smokeless tobacco: Never Used  . Alcohol Use: No    Review of Systems  Constitutional: Negative for fever and chills.  Respiratory: Negative for shortness of breath.   Cardiovascular: Negative for chest pain.  Skin: Positive for wound.  All other systems reviewed and are negative.   Allergies  Cefdinir; Adhesive; Lodine; Lortab; Niaspan; Clindamycin; and Codeine  Home Medications    Prior to Admission medications   Medication Sig Start Date End Date Taking? Authorizing Provider  acetaminophen (TYLENOL) 500 MG tablet Take 500 mg by mouth every 6 (six) hours as needed for moderate pain.   Yes Historical Provider, MD  cholestyramine Lucrezia Starch) 4 G packet Take 4 g by mouth daily.  03/18/13  Yes Historical Provider, MD  dicyclomine (BENTYL) 20 MG tablet Take 20 mg by mouth 4 (four) times daily.  12/11/12  Yes Historical Provider, MD  diphenhydrAMINE (BENADRYL) 25 MG tablet Take 25 mg by mouth.   Yes Historical Provider, MD  furosemide (LASIX) 20 MG tablet Take 20 mg by mouth daily.  04/06/15  Yes Historical Provider, MD  insulin regular (NOVOLIN R,HUMULIN R) 100 units/mL injection Inject 6 Units into the skin daily as needed for high blood sugar. For glucose greater than 175   Yes Historical Provider, MD  levothyroxine (SYNTHROID, LEVOTHROID) 100 MCG tablet Take 100 mcg by mouth daily.   Yes Historical Provider, MD  olmesartan-hydrochlorothiazide (BENICAR HCT) 40-25 MG per tablet Take 0.5 tablets by mouth every morning.    Yes Historical Provider, MD  Polyethyl Glycol-Propyl Glycol (SYSTANE ULTRA OP) Place 1-2 drops into both eyes 4 (four) times daily as needed (dry eyes).    Yes Historical Provider, MD  Polyethyl Glycol-Propyl Glycol (SYSTANE) 0.4-0.3 % GEL Apply 1 Dose to eye at bedtime.   Yes Historical Provider, MD  potassium chloride (K-DUR,KLOR-CON) 10 MEQ tablet Take 20 mEq by mouth daily.  03/24/15  Yes Historical Provider, MD  predniSONE (DELTASONE) 10 MG tablet Take 10 mg by mouth daily with breakfast.  07/25/14  Yes Historical Provider, MD  zolpidem (AMBIEN) 10 MG tablet Take 10 mg by mouth at bedtime.    Yes Historical Provider, MD  acetaminophen (TYLENOL ARTHRITIS PAIN) 650 MG CR tablet Take 650 mg by mouth 3 (three) times daily as needed for pain. Takes 650 mg tid-qid    Historical Provider, MD  doxycycline (VIBRAMYCIN) 100 MG capsule Take 1 capsule (100 mg total) by  mouth 2 (two) times daily. One po bid x 7 days 04/09/15   Kurt Azimi, MD  nitroGLYCERIN (NITROSTAT) 0.4 MG SL tablet PLACE 1 TABLET UNDER THE TONGUE EVERY 5 MINUTES AS NEEDED FOR CHEST PAIN 07/11/14   Thayer Headings, MD  oxymetazoline (AFRIN) 0.05 % nasal spray Place 1 spray into both nostrils 2 (two) times daily as needed for congestion.    Historical Provider, MD   BP 138/91 mmHg  Pulse 76  Temp(Src) 98 F (36.7 C) (Oral)  Resp 17  SpO2 95% Physical Exam  Constitutional: He is oriented to person, place, and time. He appears well-developed and well-nourished. No distress.  HENT:  Head: Normocephalic and atraumatic.  Mouth/Throat: Oropharynx is clear and moist. No oropharyngeal exudate.  Moist mucous membranes   Eyes: Conjunctivae are normal. Pupils are equal, round, and reactive to light.  Neck: No JVD present.  Trachea midline No lymph nodes No bruits  Cardiovascular: Normal rate, regular rhythm and normal heart sounds.   Pulmonary/Chest: Effort normal and breath sounds normal. No stridor. No respiratory distress.  Abdominal: Soft. He exhibits no distension. There is no tenderness.  Hyperactive bowel sounds  Musculoskeletal:  Knee joint nml   Neurological: He is alert and oriented to person, place, and time. He has normal reflexes.  Skin: Skin is warm and dry.  2.5 in wound lateral right shin distal upper 2/3rds flap through dermis; lower 1/3rd triangle shaped; tissue denuded   Psychiatric: He has a normal mood and affect. His behavior is normal.  Nursing note and vitals reviewed.    ED Course  .Marland KitchenLaceration Repair Date/Time: 04/09/2015 1:51 AM Performed by: Veatrice Kells Authorized by: Veatrice Kells Consent: Verbal consent obtained. Patient identity confirmed: arm band Body area: lower extremity Location details: right lower leg Laceration length: 3.5 cm Tendon involvement: none Nerve involvement: none Anesthesia method: LET. Patient sedated: no Irrigation  solution: saline Debridement: none Degree of undermining: none Skin closure: staples Number of sutures: 5 Technique: simple Approximation: close Approximation difficulty: simple Comments: Denuded area unable to be sutured     DIAGNOSTIC STUDIES:  Oxygen Saturation is 95% on RA, adequate by my interpretation.    COORDINATION OF CARE:  11:15 PM Discussed treatment plan with pt and wife at bedside and pt agreed to plan.   LACERATION REPAIR PROCEDURE NOTE The patient's identification was confirmed and consent was obtained. This procedure was performed by Khloee Garza, MD at 1:08 AM. Site: lateral right shin Sterile procedures observed Anesthetic used (type and amt): lido-epi-tetracaine solution applied Length:2.5 in # of Sutures: 5 staples  xeroform and sterile dressing applied Tetanus UTD Site anesthetized, irrigated with NS, explored without evidence of foreign body, wound well approximated, site covered with dry, sterile dressing.  Patient tolerated procedure well without complications. Instructions for care discussed verbally and patient provided with additional written instructions for homecare and f/u.   Imaging Review Ct Head Wo Contrast  04/09/2015  CLINICAL DATA:  Fall with extremity laceration. Initial encounter. EXAM: CT HEAD WITHOUT CONTRAST CT CERVICAL SPINE WITHOUT CONTRAST TECHNIQUE: Multidetector CT imaging of the head and cervical spine was performed following the standard protocol without intravenous contrast. Multiplanar CT image reconstructions of the cervical spine were also generated. COMPARISON:  10/27/2014 head CT FINDINGS: CT HEAD FINDINGS Skull and Sinuses:Negative for fracture or destructive process. The visualized mastoids, middle ears, and imaged paranasal sinuses are clear. Visualized orbits: Negative. Brain: No evidence of acute infarction, hemorrhage, hydrocephalus, or mass lesion/mass effect. CT CERVICAL SPINE FINDINGS Negative for acute fracture  or traumatic subluxation. No prevertebral edema. No gross cervical canal hematoma. Degenerative disc disease greatest in the mid and lower cervical spine. Facet arthropathy greatest in the upper cervical levels with C4-5 mild anterolisthesis. Degenerative changes recently evaluated by MRI 10/28/2014 IMPRESSION: No evidence of intracranial or cervical spine injury. Electronically Signed   By: Monte Fantasia M.D.   On: 04/09/2015 00:18   Ct Cervical Spine Wo Contrast  04/09/2015  CLINICAL DATA:  Fall with extremity laceration. Initial encounter. EXAM:  CT HEAD WITHOUT CONTRAST CT CERVICAL SPINE WITHOUT CONTRAST TECHNIQUE: Multidetector CT imaging of the head and cervical spine was performed following the standard protocol without intravenous contrast. Multiplanar CT image reconstructions of the cervical spine were also generated. COMPARISON:  10/27/2014 head CT FINDINGS: CT HEAD FINDINGS Skull and Sinuses:Negative for fracture or destructive process. The visualized mastoids, middle ears, and imaged paranasal sinuses are clear. Visualized orbits: Negative. Brain: No evidence of acute infarction, hemorrhage, hydrocephalus, or mass lesion/mass effect. CT CERVICAL SPINE FINDINGS Negative for acute fracture or traumatic subluxation. No prevertebral edema. No gross cervical canal hematoma. Degenerative disc disease greatest in the mid and lower cervical spine. Facet arthropathy greatest in the upper cervical levels with C4-5 mild anterolisthesis. Degenerative changes recently evaluated by MRI 10/28/2014 IMPRESSION: No evidence of intracranial or cervical spine injury. Electronically Signed   By: Monte Fantasia M.D.   On: 04/09/2015 00:18   I have personally reviewed and evaluated these images as part of my medical decision-making.   MDM   Final diagnoses:  Laceration  Noninfected skin tear of leg, right, initial encounter  Patient and wife decline labs at this time.  Will follow up with oncology for same in  am   5 staples placed in right lateral shin. Will discharge with doxycyline given cancer history. xeroform and sterile dressing applied. Follow up with wound center   Staple removal in 10 days at urgent care   I personally performed the services described in this documentation, which was scribed in my presence. The recorded information has been reviewed and is accurate.      Veatrice Kells, MD 04/09/15 [phone]

## 2015-04-08 NOTE — ED Notes (Signed)
Pt has leukemia and tonight his legs gave out on him and his right leg started bleeding, pt's wife has his leg wrapped at this time. Pt received chemo yesterday and had a portacath placed yeaterday

## 2015-04-09 ENCOUNTER — Encounter (HOSPITAL_COMMUNITY): Payer: Self-pay | Admitting: Emergency Medicine

## 2015-04-09 ENCOUNTER — Other Ambulatory Visit: Payer: BLUE CROSS/BLUE SHIELD

## 2015-04-09 DIAGNOSIS — G253 Myoclonus: Secondary | ICD-10-CM | POA: Diagnosis not present

## 2015-04-09 DIAGNOSIS — I10 Essential (primary) hypertension: Secondary | ICD-10-CM | POA: Diagnosis not present

## 2015-04-09 DIAGNOSIS — Z955 Presence of coronary angioplasty implant and graft: Secondary | ICD-10-CM | POA: Diagnosis not present

## 2015-04-09 DIAGNOSIS — S81811A Laceration without foreign body, right lower leg, initial encounter: Secondary | ICD-10-CM | POA: Diagnosis not present

## 2015-04-09 DIAGNOSIS — R5383 Other fatigue: Secondary | ICD-10-CM | POA: Diagnosis not present

## 2015-04-09 DIAGNOSIS — E039 Hypothyroidism, unspecified: Secondary | ICD-10-CM | POA: Diagnosis not present

## 2015-04-09 DIAGNOSIS — E119 Type 2 diabetes mellitus without complications: Secondary | ICD-10-CM | POA: Diagnosis not present

## 2015-04-09 DIAGNOSIS — I2510 Atherosclerotic heart disease of native coronary artery without angina pectoris: Secondary | ICD-10-CM | POA: Diagnosis not present

## 2015-04-09 MED ORDER — DOXYCYCLINE HYCLATE 100 MG PO CAPS: 100.0000 mg | ORAL_CAPSULE | Freq: Two times a day (BID) | ORAL | Status: DC

## 2015-04-09 NOTE — Discharge Instructions (Signed)
Stitches, Staples, or Adhesive Wound Closure  °Health care providers use stitches (sutures), staples, and certain glue (skin adhesives) to hold skin together while it heals (wound closure). You may need this treatment after you have surgery or if you cut your skin accidentally. These methods help your skin to heal more quickly and make it less likely that you will have a scar. A wound may take several months to heal completely.  °The type of wound you have determines when your wound gets closed. In most cases, the wound is closed as soon as possible (primary skin closure). Sometimes, closure is delayed so the wound can be cleaned and allowed to heal naturally. This reduces the chance of infection. Delayed closure may be needed if your wound:  °Is caused by a bite.  °Happened more than 6 hours ago.  °Involves loss of skin or the tissues under the skin.  °Has dirt or debris in it that cannot be removed.  °Is infected. °WHAT ARE THE DIFFERENT KINDS OF WOUND CLOSURES?  °There are many options for wound closure. The one that your health care provider uses depends on how deep and how large your wound is.  °Adhesive Glue  °To use this type of glue to close a wound, your health care provider holds the edges of the wound together and paints the glue on the surface of your skin. You may need more than one layer of glue. Then the wound may be covered with a light bandage (dressing).  °This type of skin closure may be used for small wounds that are not deep (superficial). Using glue for wound closure is less painful than other methods. It does not require a medicine that numbs the area (local anesthetic). This method also leaves nothing to be removed. Adhesive glue is often used for children and on facial wounds.  °Adhesive glue cannot be used for wounds that are deep, uneven, or bleeding. It is not used inside of a wound.  °Adhesive Strips  °These strips are made of sticky (adhesive), porous paper. They are applied across your  skin edges like a regular adhesive bandage. You leave them on until they fall off.  °Adhesive strips may be used to close very superficial wounds. They may also be used along with sutures to improve the closure of your skin edges.  °Sutures  °Sutures are the oldest method of wound closure. Sutures can be made from natural substances, such as silk, or from synthetic materials, such as nylon and steel. They can be made from a material that your body can break down as your wound heals (absorbable), or they can be made from a material that needs to be removed from your skin (nonabsorbable). They come in many different strengths and sizes.  °Your health care provider attaches the sutures to a steel needle on one end. Sutures can be passed through your skin, or through the tissues beneath your skin. Then they are tied and cut. Your skin edges may be closed in one continuous stitch or in separate stitches.  °Sutures are strong and can be used for all kinds of wounds. Absorbable sutures may be used to close tissues under the skin. The disadvantage of sutures is that they may cause skin reactions that lead to infection. Nonabsorbable sutures need to be removed.  °Staples  °When surgical staples are used to close a wound, the edges of your skin on both sides of the wound are brought close together. A staple is placed across the wound, and   an instrument secures the edges together. Staples are often used to close surgical cuts (incisions).  °Staples are faster to use than sutures, and they cause less skin reaction. Staples need to be removed using a tool that bends the staples away from your skin.  °HOW DO I CARE FOR MY WOUND CLOSURE?  °Take medicines only as directed by your health care provider.  °If you were prescribed an antibiotic medicine for your wound, finish it all even if you start to feel better.  °Use ointments or creams only as directed by your health care provider.  °Wash your hands with soap and water before and  after touching your wound.  °Do not soak your wound in water. Do not take baths, swim, or use a hot tub until your health care provider approves.  °Ask your health care provider when you can start showering. Cover your wound if directed by your health care provider.  °Do not take out your own sutures or staples.  °Do not pick at your wound. Picking can cause an infection.  °Keep all follow-up visits as directed by your health care provider. This is important. °HOW LONG WILL I HAVE MY WOUND CLOSURE?  °Leave adhesive glue on your skin until the glue peels away.  °Leave adhesive strips on your skin until the strips fall off.  °Absorbable sutures will dissolve within several days.  °Nonabsorbable sutures and staples must be removed. The location of the wound will determine how long they stay in. This can range from several days to a couple of weeks. °WHEN SHOULD I SEEK HELP FOR MY WOUND CLOSURE?  °Contact your health care provider if:  °You have a fever.  °You have chills.  °You have drainage, redness, swelling, or pain at your wound.  °There is a bad smell coming from your wound.  °The skin edges of your wound start to separate after your sutures have been removed.  °Your wound becomes thick, raised, and darker in color after your sutures come out (scarring). °This information is not intended to replace advice given to you by your health care provider. Make sure you discuss any questions you have with your health care provider.  °Document Released: 12/28/2000 Document Revised: 04/25/2014 Document Reviewed: 09/11/2013  °[employer]vier Interactive Patient Education ©2016 [employer]  ° °

## 2015-04-11 ENCOUNTER — Emergency Department (HOSPITAL_COMMUNITY): Payer: Medicare Other

## 2015-04-11 ENCOUNTER — Emergency Department (HOSPITAL_COMMUNITY)
Admission: EM | Admit: 2015-04-11 | Discharge: 2015-04-11 | Disposition: A | Discharge status: Home / Self Care | Payer: Medicare Other | Attending: Emergency Medicine | Admitting: Emergency Medicine

## 2015-04-11 ENCOUNTER — Encounter (HOSPITAL_COMMUNITY): Payer: Self-pay

## 2015-04-11 DIAGNOSIS — Z862 Personal history of diseases of the blood and blood-forming organs and certain disorders involving the immune mechanism: Secondary | ICD-10-CM | POA: Diagnosis not present

## 2015-04-11 DIAGNOSIS — Z8701 Personal history of pneumonia (recurrent): Secondary | ICD-10-CM | POA: Diagnosis not present

## 2015-04-11 DIAGNOSIS — Z85828 Personal history of other malignant neoplasm of skin: Secondary | ICD-10-CM | POA: Insufficient documentation

## 2015-04-11 DIAGNOSIS — Z7952 Long term (current) use of systemic steroids: Secondary | ICD-10-CM | POA: Insufficient documentation

## 2015-04-11 DIAGNOSIS — E119 Type 2 diabetes mellitus without complications: Secondary | ICD-10-CM | POA: Diagnosis not present

## 2015-04-11 DIAGNOSIS — Z9861 Coronary angioplasty status: Secondary | ICD-10-CM | POA: Diagnosis not present

## 2015-04-11 DIAGNOSIS — E785 Hyperlipidemia, unspecified: Secondary | ICD-10-CM | POA: Diagnosis not present

## 2015-04-11 DIAGNOSIS — J45909 Unspecified asthma, uncomplicated: Secondary | ICD-10-CM | POA: Diagnosis not present

## 2015-04-11 DIAGNOSIS — Y998 Other external cause status: Secondary | ICD-10-CM | POA: Diagnosis not present

## 2015-04-11 DIAGNOSIS — I2510 Atherosclerotic heart disease of native coronary artery without angina pectoris: Secondary | ICD-10-CM | POA: Insufficient documentation

## 2015-04-11 DIAGNOSIS — R011 Cardiac murmur, unspecified: Secondary | ICD-10-CM | POA: Insufficient documentation

## 2015-04-11 DIAGNOSIS — I10 Essential (primary) hypertension: Secondary | ICD-10-CM | POA: Diagnosis not present

## 2015-04-11 DIAGNOSIS — E039 Hypothyroidism, unspecified: Secondary | ICD-10-CM | POA: Diagnosis not present

## 2015-04-11 DIAGNOSIS — S20212A Contusion of left front wall of thorax, initial encounter: Secondary | ICD-10-CM | POA: Insufficient documentation

## 2015-04-11 DIAGNOSIS — Z8739 Personal history of other diseases of the musculoskeletal system and connective tissue: Secondary | ICD-10-CM | POA: Insufficient documentation

## 2015-04-11 DIAGNOSIS — Y92009 Unspecified place in unspecified non-institutional (private) residence as the place of occurrence of the external cause: Secondary | ICD-10-CM | POA: Insufficient documentation

## 2015-04-11 DIAGNOSIS — W108XXA Fall (on) (from) other stairs and steps, initial encounter: Secondary | ICD-10-CM | POA: Diagnosis not present

## 2015-04-11 DIAGNOSIS — Y9389 Activity, other specified: Secondary | ICD-10-CM | POA: Diagnosis not present

## 2015-04-11 DIAGNOSIS — Z8719 Personal history of other diseases of the digestive system: Secondary | ICD-10-CM | POA: Diagnosis not present

## 2015-04-11 DIAGNOSIS — Z87438 Personal history of other diseases of male genital organs: Secondary | ICD-10-CM | POA: Insufficient documentation

## 2015-04-11 DIAGNOSIS — E669 Obesity, unspecified: Secondary | ICD-10-CM | POA: Diagnosis not present

## 2015-04-11 DIAGNOSIS — S299XXA Unspecified injury of thorax, initial encounter: Secondary | ICD-10-CM | POA: Diagnosis not present

## 2015-04-11 DIAGNOSIS — Z9889 Other specified postprocedural states: Secondary | ICD-10-CM | POA: Insufficient documentation

## 2015-04-11 DIAGNOSIS — Z794 Long term (current) use of insulin: Secondary | ICD-10-CM | POA: Insufficient documentation

## 2015-04-11 DIAGNOSIS — Z79899 Other long term (current) drug therapy: Secondary | ICD-10-CM | POA: Insufficient documentation

## 2015-04-11 DIAGNOSIS — R0781 Pleurodynia: Secondary | ICD-10-CM | POA: Diagnosis not present

## 2015-04-11 MED ORDER — TRAMADOL HCL 50 MG PO TABS: 50.0000 mg | ORAL_TABLET | Freq: Four times a day (QID) | ORAL | Status: AC | PRN

## 2015-04-11 MED ORDER — TRAMADOL HCL 50 MG PO TABS
50.0000 mg | ORAL_TABLET | Freq: Once | ORAL | Status: AC
  Administered 2015-04-11: 50 mg via ORAL
  Filled 2015-04-11: qty 1

## 2015-04-11 NOTE — ED Provider Notes (Signed)
CSN: [id number]     Arrival date & time 04/11/15  1541 History   First MD Initiated Contact with Patient 04/11/15 1637     Chief Complaint  Patient presents with  . Rib Injury     (Consider location/radiation/quality/duration/timing/severity/associated sxs/prior Treatment) HPI  Pt presenting after fall 3 days ago with ongoing left rib pain.  He was seen in the ED at that time and had laceration of lower extremity repaired.  Pain in left rib has been getting worse, especially worse last night.  Pain worse with movement and palpation.  Worse with coughing. No chest pain or difficulty breathing.  No head injury. No back pain.  There are no other associated systemic symptoms, there are no other alleviating or modifying factors.  Initial fall was down several stairs at his home, he did hit his left side during the fall.    Past Medical History  Diagnosis Date  . Anorexia   . Thrombocytopenia ([place])   . Fatigue   . Hypertension   . Hyperlipidemia   . Vitamin D deficiency   . Obesity   . DDD (degenerative disc disease), cervical   . Thyroid disease   . Trouble swallowing   . Cough   . Leg swelling     due to lymphedema  . Hypothyroidism   . Diabetes mellitus     borderline-controlled by diet  . Acute GI bleeding 04/16/2011  . Coronary artery disease     stent 06  . Heart murmur   . Asthma     when  have congstion  of chest  . Pneumonia   . Anemia   . Blood dyscrasia 06/2009    thrombocytopenia  . Blood transfusion     platelets 12/12  . GERD (gastroesophageal reflux disease)   . 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 ED (erectile dysfunction)   . Polymyalgia ([place])   . IBS (irritable bowel syndrome)   . Essential hypertension   . Lymphedema: RLE 02/19/2015  . PMR (polymyalgia rheumatica) ([place]) 02/19/2015   Past Surgical History  Procedure Laterality Date  . Cardiac catheterization  01/31/2005    EF 60-65%  . Coronary angioplasty with stent placement  2006   STENTING OF HIS CORONARY ARTERY  . Appendectomy  1967  . US echocardiography  02/28/2005    EF 55-60%  . Tonsillectomy and adenoidectomy    . Nasal sinus surgery  2005  . Edg dilitation  [id number]  . Lymph node removal  2011    right groin  . Bone marrow biopsy      2011-at [facility], 01/2010-at [place], 06/12-[facility]  . Lymph node biopsy  03/15/2011    Procedure: LYMPH NODE BIOPSY;  Surgeon: Earnstine Regal, MD;  Location: [facility] ORS;  Service: General;  Laterality: Right;  Right Anterior Cervical Lymph Node Excisional Biopsy   . 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 Cataract Bilateral    Family History  Problem Relation Age of Onset  . Heart failure Father   . Hypertension Father   . Hearing loss Father 76    congestive heart failure  . Diabetes Father   . Hypertension Mother   . Stroke Mother 75   Social History  Substance Use Topics  . Smoking status: Never Smoker   . Smokeless tobacco: Never Used  . Alcohol Use: No    Review of  Systems  ROS reviewed and all otherwise negative except for mentioned in HPI    Allergies  Cefdinir; Adhesive; Lodine; Lortab; Niaspan; Clindamycin; and Codeine  Home Medications   Prior to Admission medications   Medication Sig Start Date End Date Taking? Authorizing Provider  acetaminophen (TYLENOL ARTHRITIS PAIN) 650 MG CR tablet Take 650 mg by mouth 3 (three) times daily as needed for pain. Takes 650 mg tid-qid   Yes Historical Provider, MD  acetaminophen (TYLENOL) 500 MG tablet Take 500 mg by mouth every 6 (six) hours as needed for moderate pain.   Yes Historical Provider, MD  cholestyramine Lucrezia Starch) 4 G packet Take 4 g by mouth daily.  03/18/13  Yes Historical Provider, MD  dicyclomine (BENTYL) 20 MG tablet Take 20 mg by mouth 3 (three) times daily.  12/11/12  Yes Historical Provider, MD  diphenhydrAMINE (BENADRYL) 25 MG tablet Take 25 mg by mouth at bedtime as needed for sleep.    Yes  Historical Provider, MD  doxycycline (VIBRAMYCIN) 100 MG capsule Take 1 capsule (100 mg total) by mouth 2 (two) times daily. One po bid x 7 days 04/09/15  Yes April Palumbo, MD  furosemide (LASIX) 20 MG tablet Take 20 mg by mouth daily.  04/06/15  Yes Historical Provider, MD  insulin regular (NOVOLIN R,HUMULIN R) 100 units/mL injection Inject 6 Units into the skin daily as needed for high blood sugar. For glucose greater than 175   Yes Historical Provider, MD  levothyroxine (SYNTHROID, LEVOTHROID) 100 MCG tablet Take 100 mcg by mouth daily.   Yes Historical Provider, MD  LORazepam (ATIVAN) 1 MG tablet Take 1 mg by mouth every 6 (six) hours as needed for anxiety. Anxiety 04/09/15 04/19/15 Yes Historical Provider, MD  nitroGLYCERIN (NITROSTAT) 0.4 MG SL tablet PLACE 1 TABLET UNDER THE TONGUE EVERY 5 MINUTES AS NEEDED FOR CHEST PAIN Patient taking differently: Place 0.4 mg under the tongue every 5 (five) minutes as needed for chest pain. PLACE 1 TABLET UNDER THE TONGUE EVERY 5 MINUTES AS NEEDED FOR CHEST PAIN 07/11/14  Yes Thayer Headings, MD  olmesartan-hydrochlorothiazide (BENICAR HCT) 40-25 MG per tablet Take 0.5 tablets by mouth every morning.    Yes Historical Provider, MD  oxymetazoline (AFRIN) 0.05 % nasal spray Place 1 spray into both nostrils 2 (two) times daily as needed for congestion.   Yes Historical Provider, MD  Polyethyl Glycol-Propyl Glycol (SYSTANE ULTRA OP) Place 1-2 drops into both eyes 4 (four) times daily as needed (dry eyes).    Yes Historical Provider, MD  Polyethyl Glycol-Propyl Glycol (SYSTANE) 0.4-0.3 % GEL Apply 1 Dose to eye at bedtime.   Yes Historical Provider, MD  potassium chloride (K-DUR,KLOR-CON) 10 MEQ tablet Take 20 mEq by mouth daily.  03/24/15  Yes Historical Provider, MD  predniSONE (DELTASONE) 10 MG tablet Take 10 mg by mouth daily with breakfast.  07/25/14  Yes Historical Provider, MD  zolpidem (AMBIEN) 10 MG tablet Take 10 mg by mouth at bedtime.    Yes Historical  Provider, MD  traMADol (ULTRAM) 50 MG tablet Take 1 tablet (50 mg total) by mouth every 6 (six) hours as needed. 04/11/15   Alfonzo Beers, MD   BP 124/75 mmHg  Pulse 97  Temp(Src) 97.8 F (36.6 C) (Oral)  Resp 22  SpO2 95%  Vitals reviewed Physical Exam  Physical Examination: General appearance - alert, well appearing, and in no distress Mental status - alert, oriented to person, place, and time Eyes - no conjunctival injection, no scleral icterus  Chest - clear to auscultation, no wheezes, rales or rhonchi, symmetric air entry, ttp over left ribs at mid axillary line, no crepitus, no bruising overlying Heart - normal rate, regular rhythm, normal S1, S2, no murmurs, rubs, clicks or gallops Back exam -no midline tenderness to palpation, no CVA tenderness Neurological - alert, oriented, normal speech Musculoskeletal - no joint tenderness, deformity or swelling Skin - normal coloration and turgor, no rashes  ED Course  Procedures (including critical care time) Labs Review Labs Reviewed - No data to display  Imaging Review Dg Ribs Unilateral W/chest Left  04/11/2015  CLINICAL DATA:  Left rib pain after fall on Wednesday EXAM: LEFT RIBS AND CHEST - 3+ VIEW COMPARISON:  02/19/2015 FINDINGS: Five views left ribs submitted. No acute infiltrate or pulmonary edema. No definite left rib fracture is identified. There is no pneumothorax. IMPRESSION: No acute infiltrate or pulmonary edema. No definite left rib fracture is identified. Electronically Signed   By: Lahoma Crocker M.D.   On: 04/11/2015 17:30   I have personally reviewed and evaluated these images and lab results as part of my medical decision-making.   EKG Interpretation None      MDM   Final diagnoses:  Rib contusion, left, initial encounter    Pt presenting with c/o left rib pain after fall several days ago.  Xray reassuring, pt feels improved after tramadol.  Will treat as clinical rib fracture- given incentive spirometer for  home use and rx for pain control.  Close f/u with PMD.  Discharged with strict return precautions.  Pt agreeable with plan.    Alfonzo Beers, MD 04/11/15 [phone]

## 2015-04-11 NOTE — Discharge Instructions (Signed)
Return to the ED with any concerns including difficulty breathing, chest pain, fainting, vomiting, decreased level of alertness/lethargy, or any other alarming symptoms

## 2015-04-11 NOTE — ED Notes (Signed)
He states he fell this Wed., saw Dr. Randal Buba here, who sutured his leg wound.  He states that a couple of days after the fall he is experiencing left lower post. Rib/thoracic area pain.  He is in no distress.  He currently is a chemo. Pt. At [facility] for leukemia and is wearing a mask for his protection.

## 2015-04-14 ENCOUNTER — Ambulatory Visit (HOSPITAL_BASED_OUTPATIENT_CLINIC_OR_DEPARTMENT_OTHER): Payer: Medicare Other

## 2015-04-14 DIAGNOSIS — D696 Thrombocytopenia, unspecified: Secondary | ICD-10-CM

## 2015-04-14 LAB — MANUAL DIFFERENTIAL
ALC: 1.1 10*3/uL (ref 0.9–3.3)
ANC (CHCC MAN DIFF): 3.3 10*3/uL (ref 1.5–6.5)
BAND NEUTROPHILS: 1 % (ref 0–10)
BLASTS: 0 % (ref 0–0)
Basophil: 0 % (ref 0–2)
EOS: 1 % (ref 0–7)
LYMPH: 12 % — AB (ref 14–49)
MONO: 51 % — ABNORMAL HIGH (ref 0–14)
MYELOCYTES: 0 % (ref 0–0)
Metamyelocytes: 0 % (ref 0–0)
Other Cell: 0 % (ref 0–0)
PLT EST: DECREASED
PROMYELO: 0 % (ref 0–0)
SEG: 35 % — ABNORMAL LOW (ref 38–77)
VARIANT LYMPH: 0 % (ref 0–0)
nRBC: 0 % (ref 0–0)

## 2015-04-14 LAB — CBC WITH DIFFERENTIAL/PLATELET
HCT: 39.9 % (ref 38.4–49.9)
HEMOGLOBIN: 13.2 g/dL (ref 13.0–17.1)
MCH: 31.3 pg (ref 27.2–33.4)
MCHC: 33 g/dL (ref 32.0–36.0)
MCV: 94.8 fL (ref 79.3–98.0)
Platelets: 83 10*3/uL — ABNORMAL LOW (ref 140–400)
RBC: 4.21 10*6/uL (ref 4.20–5.82)
RDW: 26.2 % — ABNORMAL HIGH (ref 11.0–14.6)
WBC: 9.2 10*3/uL (ref 4.0–10.3)

## 2015-04-15 DIAGNOSIS — Z4802 Encounter for removal of sutures: Secondary | ICD-10-CM | POA: Diagnosis not present

## 2015-04-15 DIAGNOSIS — L089 Local infection of the skin and subcutaneous tissue, unspecified: Secondary | ICD-10-CM | POA: Diagnosis not present

## 2015-04-15 DIAGNOSIS — Z6830 Body mass index (BMI) 30.0-30.9, adult: Secondary | ICD-10-CM | POA: Diagnosis not present

## 2015-04-15 DIAGNOSIS — L03115 Cellulitis of right lower limb: Secondary | ICD-10-CM | POA: Diagnosis not present

## 2015-04-15 DIAGNOSIS — R600 Localized edema: Secondary | ICD-10-CM | POA: Diagnosis not present

## 2015-04-16 DIAGNOSIS — I890 Lymphedema, not elsewhere classified: Secondary | ICD-10-CM | POA: Diagnosis not present

## 2015-04-16 DIAGNOSIS — M353 Polymyalgia rheumatica: Secondary | ICD-10-CM | POA: Diagnosis not present

## 2015-04-16 DIAGNOSIS — S81801A Unspecified open wound, right lower leg, initial encounter: Secondary | ICD-10-CM | POA: Diagnosis not present

## 2015-04-16 DIAGNOSIS — D696 Thrombocytopenia, unspecified: Secondary | ICD-10-CM | POA: Diagnosis not present

## 2015-04-16 DIAGNOSIS — D61810 Antineoplastic chemotherapy induced pancytopenia: Secondary | ICD-10-CM | POA: Diagnosis not present

## 2015-04-16 DIAGNOSIS — T8131XA Disruption of external operation (surgical) wound, not elsewhere classified, initial encounter: Secondary | ICD-10-CM | POA: Diagnosis not present

## 2015-04-16 DIAGNOSIS — C9200 Acute myeloblastic leukemia, not having achieved remission: Secondary | ICD-10-CM | POA: Diagnosis not present

## 2015-04-16 DIAGNOSIS — I10 Essential (primary) hypertension: Secondary | ICD-10-CM | POA: Diagnosis not present

## 2015-04-16 DIAGNOSIS — Z881 Allergy status to other antibiotic agents status: Secondary | ICD-10-CM | POA: Diagnosis not present

## 2015-04-16 DIAGNOSIS — L97911 Non-pressure chronic ulcer of unspecified part of right lower leg limited to breakdown of skin: Secondary | ICD-10-CM | POA: Diagnosis not present

## 2015-04-16 DIAGNOSIS — Z79899 Other long term (current) drug therapy: Secondary | ICD-10-CM | POA: Diagnosis not present

## 2015-04-16 DIAGNOSIS — I2510 Atherosclerotic heart disease of native coronary artery without angina pectoris: Secondary | ICD-10-CM | POA: Diagnosis not present

## 2015-04-16 DIAGNOSIS — E119 Type 2 diabetes mellitus without complications: Secondary | ICD-10-CM | POA: Diagnosis not present

## 2015-04-16 DIAGNOSIS — Z888 Allergy status to other drugs, medicaments and biological substances status: Secondary | ICD-10-CM | POA: Diagnosis not present

## 2015-04-16 DIAGNOSIS — E039 Hypothyroidism, unspecified: Secondary | ICD-10-CM | POA: Diagnosis not present

## 2015-04-16 DIAGNOSIS — I878 Other specified disorders of veins: Secondary | ICD-10-CM | POA: Diagnosis not present

## 2015-04-17 ENCOUNTER — Other Ambulatory Visit (HOSPITAL_BASED_OUTPATIENT_CLINIC_OR_DEPARTMENT_OTHER): Payer: Medicare Other

## 2015-04-17 DIAGNOSIS — D696 Thrombocytopenia, unspecified: Secondary | ICD-10-CM | POA: Diagnosis present

## 2015-04-17 DIAGNOSIS — C9200 Acute myeloblastic leukemia, not having achieved remission: Secondary | ICD-10-CM

## 2015-04-17 LAB — CBC WITH DIFFERENTIAL/PLATELET
BASO%: 0.4 % (ref 0.0–2.0)
Basophils Absolute: 0 10*3/uL (ref 0.0–0.1)
EOS ABS: 0.1 10*3/uL (ref 0.0–0.5)
EOS%: 1.3 % (ref 0.0–7.0)
HEMATOCRIT: 38.5 % (ref 38.4–49.9)
HGB: 12.6 g/dL — ABNORMAL LOW (ref 13.0–17.1)
LYMPH#: 0.7 10*3/uL — AB (ref 0.9–3.3)
LYMPH%: 6.8 % — AB (ref 14.0–49.0)
MCH: 31.5 pg (ref 27.2–33.4)
MCHC: 32.8 g/dL (ref 32.0–36.0)
MCV: 95.9 fL (ref 79.3–98.0)
MONO#: 6.2 10*3/uL — AB (ref 0.1–0.9)
MONO%: 63.7 % — ABNORMAL HIGH (ref 0.0–14.0)
NEUT%: 27.8 % — AB (ref 39.0–75.0)
NEUTROS ABS: 2.7 10*3/uL (ref 1.5–6.5)
PLATELETS: 67 10*3/uL — AB (ref 140–400)
RBC: 4.01 10*6/uL — AB (ref 4.20–5.82)
RDW: 26.8 % — ABNORMAL HIGH (ref 11.0–14.6)
WBC: 9.7 10*3/uL (ref 4.0–10.3)

## 2015-04-17 LAB — TECHNOLOGIST REVIEW

## 2015-04-21 ENCOUNTER — Other Ambulatory Visit (HOSPITAL_BASED_OUTPATIENT_CLINIC_OR_DEPARTMENT_OTHER): Payer: Medicare Other

## 2015-04-21 ENCOUNTER — Other Ambulatory Visit: Payer: BLUE CROSS/BLUE SHIELD

## 2015-04-21 DIAGNOSIS — D696 Thrombocytopenia, unspecified: Secondary | ICD-10-CM | POA: Diagnosis present

## 2015-04-21 DIAGNOSIS — C9200 Acute myeloblastic leukemia, not having achieved remission: Secondary | ICD-10-CM

## 2015-04-21 LAB — CBC WITH DIFFERENTIAL/PLATELET
BASO%: 0.5 % (ref 0.0–2.0)
BASOS ABS: 0 10*3/uL (ref 0.0–0.1)
EOS%: 1.7 % (ref 0.0–7.0)
Eosinophils Absolute: 0.2 10*3/uL (ref 0.0–0.5)
HEMATOCRIT: 39.3 % (ref 38.4–49.9)
HGB: 12.9 g/dL — ABNORMAL LOW (ref 13.0–17.1)
LYMPH#: 0.6 10*3/uL — AB (ref 0.9–3.3)
LYMPH%: 7.1 % — AB (ref 14.0–49.0)
MCH: 31.7 pg (ref 27.2–33.4)
MCHC: 32.7 g/dL (ref 32.0–36.0)
MCV: 96.8 fL (ref 79.3–98.0)
MONO#: 4.3 10*3/uL — AB (ref 0.1–0.9)
MONO%: 47.5 % — ABNORMAL HIGH (ref 0.0–14.0)
NEUT#: 3.9 10*3/uL (ref 1.5–6.5)
NEUT%: 43.2 % (ref 39.0–75.0)
PLATELETS: 60 10*3/uL — AB (ref 140–400)
RBC: 4.06 10*6/uL — ABNORMAL LOW (ref 4.20–5.82)
RDW: 26.6 % — ABNORMAL HIGH (ref 11.0–14.6)
WBC: 9 10*3/uL (ref 4.0–10.3)

## 2015-04-21 LAB — TECHNOLOGIST REVIEW

## 2015-04-22 DIAGNOSIS — I878 Other specified disorders of veins: Secondary | ICD-10-CM | POA: Diagnosis not present

## 2015-04-22 DIAGNOSIS — L97911 Non-pressure chronic ulcer of unspecified part of right lower leg limited to breakdown of skin: Secondary | ICD-10-CM | POA: Diagnosis not present

## 2015-04-22 DIAGNOSIS — E119 Type 2 diabetes mellitus without complications: Secondary | ICD-10-CM | POA: Diagnosis not present

## 2015-04-22 DIAGNOSIS — I2510 Atherosclerotic heart disease of native coronary artery without angina pectoris: Secondary | ICD-10-CM | POA: Diagnosis not present

## 2015-04-22 DIAGNOSIS — I872 Venous insufficiency (chronic) (peripheral): Secondary | ICD-10-CM | POA: Diagnosis not present

## 2015-04-22 DIAGNOSIS — I890 Lymphedema, not elsewhere classified: Secondary | ICD-10-CM | POA: Diagnosis not present

## 2015-04-22 DIAGNOSIS — L97219 Non-pressure chronic ulcer of right calf with unspecified severity: Secondary | ICD-10-CM | POA: Diagnosis not present

## 2015-04-22 DIAGNOSIS — I739 Peripheral vascular disease, unspecified: Secondary | ICD-10-CM | POA: Diagnosis not present

## 2015-04-22 DIAGNOSIS — E039 Hypothyroidism, unspecified: Secondary | ICD-10-CM | POA: Diagnosis not present

## 2015-04-22 DIAGNOSIS — I10 Essential (primary) hypertension: Secondary | ICD-10-CM | POA: Diagnosis not present

## 2015-04-24 ENCOUNTER — Other Ambulatory Visit: Payer: Self-pay | Admitting: *Deleted

## 2015-04-24 DIAGNOSIS — Z881 Allergy status to other antibiotic agents status: Secondary | ICD-10-CM | POA: Diagnosis not present

## 2015-04-24 DIAGNOSIS — E039 Hypothyroidism, unspecified: Secondary | ICD-10-CM | POA: Diagnosis not present

## 2015-04-24 DIAGNOSIS — Z888 Allergy status to other drugs, medicaments and biological substances status: Secondary | ICD-10-CM | POA: Diagnosis not present

## 2015-04-24 DIAGNOSIS — D696 Thrombocytopenia, unspecified: Secondary | ICD-10-CM | POA: Diagnosis not present

## 2015-04-24 DIAGNOSIS — D649 Anemia, unspecified: Secondary | ICD-10-CM | POA: Diagnosis not present

## 2015-04-24 DIAGNOSIS — G4700 Insomnia, unspecified: Secondary | ICD-10-CM | POA: Diagnosis not present

## 2015-04-24 DIAGNOSIS — E119 Type 2 diabetes mellitus without complications: Secondary | ICD-10-CM | POA: Diagnosis not present

## 2015-04-24 DIAGNOSIS — Z794 Long term (current) use of insulin: Secondary | ICD-10-CM | POA: Diagnosis not present

## 2015-04-24 DIAGNOSIS — M353 Polymyalgia rheumatica: Secondary | ICD-10-CM | POA: Diagnosis not present

## 2015-04-24 DIAGNOSIS — Z79899 Other long term (current) drug therapy: Secondary | ICD-10-CM | POA: Diagnosis not present

## 2015-04-24 DIAGNOSIS — Z885 Allergy status to narcotic agent status: Secondary | ICD-10-CM | POA: Diagnosis not present

## 2015-04-24 DIAGNOSIS — Z9889 Other specified postprocedural states: Secondary | ICD-10-CM | POA: Diagnosis not present

## 2015-04-24 DIAGNOSIS — Z8719 Personal history of other diseases of the digestive system: Secondary | ICD-10-CM | POA: Diagnosis not present

## 2015-04-24 DIAGNOSIS — Z7952 Long term (current) use of systemic steroids: Secondary | ICD-10-CM | POA: Diagnosis not present

## 2015-04-24 DIAGNOSIS — E876 Hypokalemia: Secondary | ICD-10-CM | POA: Diagnosis not present

## 2015-04-24 DIAGNOSIS — C9200 Acute myeloblastic leukemia, not having achieved remission: Secondary | ICD-10-CM | POA: Diagnosis not present

## 2015-04-24 DIAGNOSIS — K589 Irritable bowel syndrome without diarrhea: Secondary | ICD-10-CM | POA: Diagnosis not present

## 2015-04-24 DIAGNOSIS — I2510 Atherosclerotic heart disease of native coronary artery without angina pectoris: Secondary | ICD-10-CM | POA: Diagnosis not present

## 2015-04-24 DIAGNOSIS — R251 Tremor, unspecified: Secondary | ICD-10-CM | POA: Diagnosis not present

## 2015-04-24 DIAGNOSIS — R600 Localized edema: Secondary | ICD-10-CM | POA: Diagnosis not present

## 2015-04-24 DIAGNOSIS — I10 Essential (primary) hypertension: Secondary | ICD-10-CM | POA: Diagnosis not present

## 2015-04-24 DIAGNOSIS — Z85828 Personal history of other malignant neoplasm of skin: Secondary | ICD-10-CM | POA: Diagnosis not present

## 2015-04-27 ENCOUNTER — Other Ambulatory Visit (HOSPITAL_BASED_OUTPATIENT_CLINIC_OR_DEPARTMENT_OTHER): Payer: Medicare Other

## 2015-04-27 DIAGNOSIS — C9200 Acute myeloblastic leukemia, not having achieved remission: Secondary | ICD-10-CM | POA: Diagnosis present

## 2015-04-27 DIAGNOSIS — L97911 Non-pressure chronic ulcer of unspecified part of right lower leg limited to breakdown of skin: Secondary | ICD-10-CM | POA: Diagnosis not present

## 2015-04-27 LAB — CBC WITH DIFFERENTIAL/PLATELET
BASO%: 0.5 % (ref 0.0–2.0)
BASOS ABS: 0 10*3/uL (ref 0.0–0.1)
EOS%: 2.1 % (ref 0.0–7.0)
Eosinophils Absolute: 0.2 10*3/uL (ref 0.0–0.5)
HEMATOCRIT: 38.4 % (ref 38.4–49.9)
HEMOGLOBIN: 12.8 g/dL — AB (ref 13.0–17.1)
LYMPH#: 0.7 10*3/uL — AB (ref 0.9–3.3)
LYMPH%: 8.9 % — ABNORMAL LOW (ref 14.0–49.0)
MCH: 32.2 pg (ref 27.2–33.4)
MCHC: 33.2 g/dL (ref 32.0–36.0)
MCV: 97 fL (ref 79.3–98.0)
MONO#: 3.7 10*3/uL — AB (ref 0.1–0.9)
MONO%: 45.5 % — ABNORMAL HIGH (ref 0.0–14.0)
NEUT#: 3.5 10*3/uL (ref 1.5–6.5)
NEUT%: 43 % (ref 39.0–75.0)
NRBC: 0 % (ref 0–0)
Platelets: 57 10*3/uL — ABNORMAL LOW (ref 140–400)
RBC: 3.96 10*6/uL — ABNORMAL LOW (ref 4.20–5.82)
RDW: 25.6 % — AB (ref 11.0–14.6)
WBC: 8.1 10*3/uL (ref 4.0–10.3)

## 2015-04-27 LAB — COMPREHENSIVE METABOLIC PANEL
ALBUMIN: 3.9 g/dL (ref 3.5–5.0)
ALK PHOS: 312 U/L — AB (ref 40–150)
ALT: 49 U/L (ref 0–55)
AST: 42 U/L — AB (ref 5–34)
Anion Gap: 10 mEq/L (ref 3–11)
BILIRUBIN TOTAL: 1.09 mg/dL (ref 0.20–1.20)
BUN: 18.7 mg/dL (ref 7.0–26.0)
CALCIUM: 9.7 mg/dL (ref 8.4–10.4)
CO2: 27 mEq/L (ref 22–29)
CREATININE: 1 mg/dL (ref 0.7–1.3)
Chloride: 101 mEq/L (ref 98–109)
EGFR: 71 mL/min/{1.73_m2} — ABNORMAL LOW (ref >90)
GLUCOSE: 133 mg/dL (ref 70–140)
POTASSIUM: 4.6 meq/L (ref 3.5–5.1)
Sodium: 138 mEq/L (ref 136–145)
TOTAL PROTEIN: 7.5 g/dL (ref 6.4–8.3)

## 2015-04-27 LAB — TECHNOLOGIST REVIEW

## 2015-04-27 LAB — MAGNESIUM: Magnesium: 2 mg/dl (ref 1.5–2.5)

## 2015-04-29 ENCOUNTER — Other Ambulatory Visit: Payer: Self-pay | Admitting: Nurse Practitioner

## 2015-04-29 DIAGNOSIS — C9200 Acute myeloblastic leukemia, not having achieved remission: Secondary | ICD-10-CM

## 2015-04-30 ENCOUNTER — Other Ambulatory Visit (HOSPITAL_BASED_OUTPATIENT_CLINIC_OR_DEPARTMENT_OTHER): Payer: Medicare Other

## 2015-04-30 DIAGNOSIS — C9200 Acute myeloblastic leukemia, not having achieved remission: Secondary | ICD-10-CM | POA: Diagnosis not present

## 2015-04-30 LAB — CBC WITH DIFFERENTIAL/PLATELET
BASO%: 0.5 % (ref 0.0–2.0)
Basophils Absolute: 0 10*3/uL (ref 0.0–0.1)
EOS ABS: 0.2 10*3/uL (ref 0.0–0.5)
EOS%: 2.2 % (ref 0.0–7.0)
HCT: 36.9 % — ABNORMAL LOW (ref 38.4–49.9)
HEMOGLOBIN: 12.2 g/dL — AB (ref 13.0–17.1)
LYMPH#: 0.6 10*3/uL — AB (ref 0.9–3.3)
LYMPH%: 8.1 % — ABNORMAL LOW (ref 14.0–49.0)
MCH: 32.6 pg (ref 27.2–33.4)
MCHC: 33.1 g/dL (ref 32.0–36.0)
MCV: 98.5 fL — ABNORMAL HIGH (ref 79.3–98.0)
MONO#: 3.5 10*3/uL — ABNORMAL HIGH (ref 0.1–0.9)
MONO%: 47.6 % — AB (ref 0.0–14.0)
NEUT%: 41.6 % (ref 39.0–75.0)
NEUTROS ABS: 3.1 10*3/uL (ref 1.5–6.5)
Platelets: 55 10*3/uL — ABNORMAL LOW (ref 140–400)
RBC: 3.75 10*6/uL — ABNORMAL LOW (ref 4.20–5.82)
RDW: 25.1 % — AB (ref 11.0–14.6)
WBC: 7.4 10*3/uL (ref 4.0–10.3)

## 2015-04-30 LAB — COMPREHENSIVE METABOLIC PANEL
ALBUMIN: 3.9 g/dL (ref 3.5–5.0)
ALK PHOS: 333 U/L — AB (ref 40–150)
ALT: 48 U/L (ref 0–55)
AST: 42 U/L — AB (ref 5–34)
Anion Gap: 10 mEq/L (ref 3–11)
BILIRUBIN TOTAL: 1.12 mg/dL (ref 0.20–1.20)
BUN: 16 mg/dL (ref 7.0–26.0)
CO2: 26 mEq/L (ref 22–29)
CREATININE: 1 mg/dL (ref 0.7–1.3)
Calcium: 9.7 mg/dL (ref 8.4–10.4)
Chloride: 100 mEq/L (ref 98–109)
EGFR: 76 mL/min/{1.73_m2} — ABNORMAL LOW (ref >90)
GLUCOSE: 136 mg/dL (ref 70–140)
Potassium: 4.6 mEq/L (ref 3.5–5.1)
SODIUM: 137 meq/L (ref 136–145)
TOTAL PROTEIN: 7.5 g/dL (ref 6.4–8.3)

## 2015-04-30 LAB — TECHNOLOGIST REVIEW

## 2015-05-01 ENCOUNTER — Other Ambulatory Visit: Payer: Self-pay | Admitting: *Deleted

## 2015-05-01 DIAGNOSIS — C9200 Acute myeloblastic leukemia, not having achieved remission: Secondary | ICD-10-CM

## 2015-05-04 ENCOUNTER — Other Ambulatory Visit (HOSPITAL_BASED_OUTPATIENT_CLINIC_OR_DEPARTMENT_OTHER): Payer: Medicare Other

## 2015-05-04 DIAGNOSIS — C9200 Acute myeloblastic leukemia, not having achieved remission: Secondary | ICD-10-CM | POA: Diagnosis not present

## 2015-05-04 LAB — COMPREHENSIVE METABOLIC PANEL
ALT: 64 U/L — AB (ref 0–55)
ANION GAP: 11 meq/L (ref 3–11)
AST: 59 U/L — ABNORMAL HIGH (ref 5–34)
Albumin: 4.1 g/dL (ref 3.5–5.0)
Alkaline Phosphatase: 349 U/L — ABNORMAL HIGH (ref 40–150)
BUN: 17.1 mg/dL (ref 7.0–26.0)
CHLORIDE: 102 meq/L (ref 98–109)
CO2: 29 meq/L (ref 22–29)
CREATININE: 0.9 mg/dL (ref 0.7–1.3)
Calcium: 10 mg/dL (ref 8.4–10.4)
EGFR: 86 mL/min/{1.73_m2} — ABNORMAL LOW (ref >90)
GLUCOSE: 134 mg/dL (ref 70–140)
Potassium: 4.4 mEq/L (ref 3.5–5.1)
SODIUM: 142 meq/L (ref 136–145)
Total Bilirubin: 1.02 mg/dL (ref 0.20–1.20)
Total Protein: 7.9 g/dL (ref 6.4–8.3)

## 2015-05-04 LAB — CBC WITH DIFFERENTIAL/PLATELET
BASO%: 0.4 % (ref 0.0–2.0)
Basophils Absolute: 0 10*3/uL (ref 0.0–0.1)
EOS%: 1.7 % (ref 0.0–7.0)
Eosinophils Absolute: 0.2 10*3/uL (ref 0.0–0.5)
HCT: 39.2 % (ref 38.4–49.9)
HGB: 12.9 g/dL — ABNORMAL LOW (ref 13.0–17.1)
LYMPH%: 9 % — AB (ref 14.0–49.0)
MCH: 32.9 pg (ref 27.2–33.4)
MCHC: 32.9 g/dL (ref 32.0–36.0)
MCV: 100 fL — ABNORMAL HIGH (ref 79.3–98.0)
MONO#: 5.1 10*3/uL — AB (ref 0.1–0.9)
MONO%: 52.1 % — AB (ref 0.0–14.0)
NEUT%: 36.8 % — AB (ref 39.0–75.0)
NEUTROS ABS: 3.6 10*3/uL (ref 1.5–6.5)
PLATELETS: 48 10*3/uL — AB (ref 140–400)
RBC: 3.92 10*6/uL — AB (ref 4.20–5.82)
RDW: 25.7 % — ABNORMAL HIGH (ref 11.0–14.6)
WBC: 9.9 10*3/uL (ref 4.0–10.3)
lymph#: 0.9 10*3/uL (ref 0.9–3.3)

## 2015-05-04 LAB — MAGNESIUM: Magnesium: 2.2 mg/dl (ref 1.5–2.5)

## 2015-05-04 LAB — TECHNOLOGIST REVIEW

## 2015-05-05 DIAGNOSIS — J040 Acute laryngitis: Secondary | ICD-10-CM | POA: Diagnosis not present

## 2015-05-05 DIAGNOSIS — B379 Candidiasis, unspecified: Secondary | ICD-10-CM | POA: Diagnosis not present

## 2015-05-07 ENCOUNTER — Other Ambulatory Visit: Payer: BLUE CROSS/BLUE SHIELD

## 2015-05-07 DIAGNOSIS — I2510 Atherosclerotic heart disease of native coronary artery without angina pectoris: Secondary | ICD-10-CM | POA: Diagnosis not present

## 2015-05-07 DIAGNOSIS — D61810 Antineoplastic chemotherapy induced pancytopenia: Secondary | ICD-10-CM | POA: Diagnosis not present

## 2015-05-07 DIAGNOSIS — I10 Essential (primary) hypertension: Secondary | ICD-10-CM | POA: Diagnosis not present

## 2015-05-07 DIAGNOSIS — L97911 Non-pressure chronic ulcer of unspecified part of right lower leg limited to breakdown of skin: Secondary | ICD-10-CM | POA: Diagnosis not present

## 2015-05-07 DIAGNOSIS — C9200 Acute myeloblastic leukemia, not having achieved remission: Secondary | ICD-10-CM | POA: Diagnosis not present

## 2015-05-07 DIAGNOSIS — E11622 Type 2 diabetes mellitus with other skin ulcer: Secondary | ICD-10-CM | POA: Diagnosis not present

## 2015-05-07 DIAGNOSIS — E039 Hypothyroidism, unspecified: Secondary | ICD-10-CM | POA: Diagnosis not present

## 2015-05-07 DIAGNOSIS — I890 Lymphedema, not elsewhere classified: Secondary | ICD-10-CM | POA: Diagnosis not present

## 2015-05-07 DIAGNOSIS — D696 Thrombocytopenia, unspecified: Secondary | ICD-10-CM | POA: Diagnosis not present

## 2015-05-07 DIAGNOSIS — I878 Other specified disorders of veins: Secondary | ICD-10-CM | POA: Diagnosis not present

## 2015-05-08 ENCOUNTER — Other Ambulatory Visit (HOSPITAL_BASED_OUTPATIENT_CLINIC_OR_DEPARTMENT_OTHER): Payer: Medicare Other

## 2015-05-08 DIAGNOSIS — C9200 Acute myeloblastic leukemia, not having achieved remission: Secondary | ICD-10-CM | POA: Diagnosis present

## 2015-05-08 LAB — CBC WITH DIFFERENTIAL/PLATELET
BASO%: 0.4 % (ref 0.0–2.0)
BASOS ABS: 0 10*3/uL (ref 0.0–0.1)
EOS ABS: 0.1 10*3/uL (ref 0.0–0.5)
EOS%: 1.8 % (ref 0.0–7.0)
HEMATOCRIT: 37 % — AB (ref 38.4–49.9)
HGB: 12.3 g/dL — ABNORMAL LOW (ref 13.0–17.1)
LYMPH#: 0.5 10*3/uL — AB (ref 0.9–3.3)
LYMPH%: 8.9 % — ABNORMAL LOW (ref 14.0–49.0)
MCH: 33.4 pg (ref 27.2–33.4)
MCHC: 33.4 g/dL (ref 32.0–36.0)
MCV: 100.1 fL — AB (ref 79.3–98.0)
MONO#: 2.7 10*3/uL — ABNORMAL HIGH (ref 0.1–0.9)
MONO%: 44.4 % — AB (ref 0.0–14.0)
NEUT#: 2.7 10*3/uL (ref 1.5–6.5)
NEUT%: 44.5 % (ref 39.0–75.0)
Platelets: 45 10*3/uL — ABNORMAL LOW (ref 140–400)
RBC: 3.7 10*6/uL — ABNORMAL LOW (ref 4.20–5.82)
RDW: 24.8 % — ABNORMAL HIGH (ref 11.0–14.6)
WBC: 6 10*3/uL (ref 4.0–10.3)

## 2015-05-08 LAB — TECHNOLOGIST REVIEW

## 2015-05-11 ENCOUNTER — Other Ambulatory Visit (HOSPITAL_BASED_OUTPATIENT_CLINIC_OR_DEPARTMENT_OTHER): Payer: Medicare Other

## 2015-05-11 DIAGNOSIS — C9200 Acute myeloblastic leukemia, not having achieved remission: Secondary | ICD-10-CM | POA: Diagnosis present

## 2015-05-11 LAB — CBC WITH DIFFERENTIAL/PLATELET
BASO%: 0.6 % (ref 0.0–2.0)
BASOS ABS: 0 10*3/uL (ref 0.0–0.1)
EOS ABS: 0.1 10*3/uL (ref 0.0–0.5)
EOS%: 1.4 % (ref 0.0–7.0)
HEMATOCRIT: 36.2 % — AB (ref 38.4–49.9)
HGB: 12.1 g/dL — ABNORMAL LOW (ref 13.0–17.1)
LYMPH%: 9.8 % — AB (ref 14.0–49.0)
MCH: 33.5 pg — ABNORMAL HIGH (ref 27.2–33.4)
MCHC: 33.4 g/dL (ref 32.0–36.0)
MCV: 100 fL — AB (ref 79.3–98.0)
MONO#: 2.8 10*3/uL — AB (ref 0.1–0.9)
MONO%: 46.3 % — AB (ref 0.0–14.0)
NEUT#: 2.6 10*3/uL (ref 1.5–6.5)
NEUT%: 41.9 % (ref 39.0–75.0)
PLATELETS: 47 10*3/uL — AB (ref 140–400)
RBC: 3.62 10*6/uL — AB (ref 4.20–5.82)
RDW: 23.6 % — ABNORMAL HIGH (ref 11.0–14.6)
WBC: 6.1 10*3/uL (ref 4.0–10.3)
lymph#: 0.6 10*3/uL — ABNORMAL LOW (ref 0.9–3.3)
nRBC: 0 % (ref 0–0)

## 2015-05-11 LAB — COMPREHENSIVE METABOLIC PANEL
ALT: 44 U/L (ref 0–55)
ANION GAP: 10 meq/L (ref 3–11)
AST: 29 U/L (ref 5–34)
Albumin: 3.9 g/dL (ref 3.5–5.0)
Alkaline Phosphatase: 305 U/L — ABNORMAL HIGH (ref 40–150)
BILIRUBIN TOTAL: 1.22 mg/dL — AB (ref 0.20–1.20)
BUN: 19.6 mg/dL (ref 7.0–26.0)
CALCIUM: 9.7 mg/dL (ref 8.4–10.4)
CO2: 26 meq/L (ref 22–29)
CREATININE: 1 mg/dL (ref 0.7–1.3)
Chloride: 100 mEq/L (ref 98–109)
EGFR: 79 mL/min/{1.73_m2} — AB (ref >90)
Glucose: 150 mg/dl — ABNORMAL HIGH (ref 70–140)
Potassium: 4.5 mEq/L (ref 3.5–5.1)
Sodium: 136 mEq/L (ref 136–145)
TOTAL PROTEIN: 7.2 g/dL (ref 6.4–8.3)

## 2015-05-11 LAB — MAGNESIUM: MAGNESIUM: 2 mg/dL (ref 1.5–2.5)

## 2015-05-11 LAB — TECHNOLOGIST REVIEW

## 2015-05-12 DIAGNOSIS — R490 Dysphonia: Secondary | ICD-10-CM | POA: Diagnosis not present

## 2015-05-12 DIAGNOSIS — J040 Acute laryngitis: Secondary | ICD-10-CM | POA: Diagnosis not present

## 2015-05-14 ENCOUNTER — Other Ambulatory Visit: Payer: BLUE CROSS/BLUE SHIELD

## 2015-05-14 DIAGNOSIS — E039 Hypothyroidism, unspecified: Secondary | ICD-10-CM | POA: Diagnosis not present

## 2015-05-14 DIAGNOSIS — I890 Lymphedema, not elsewhere classified: Secondary | ICD-10-CM | POA: Diagnosis not present

## 2015-05-14 DIAGNOSIS — D61810 Antineoplastic chemotherapy induced pancytopenia: Secondary | ICD-10-CM | POA: Diagnosis not present

## 2015-05-14 DIAGNOSIS — I2510 Atherosclerotic heart disease of native coronary artery without angina pectoris: Secondary | ICD-10-CM | POA: Diagnosis not present

## 2015-05-14 DIAGNOSIS — L97911 Non-pressure chronic ulcer of unspecified part of right lower leg limited to breakdown of skin: Secondary | ICD-10-CM | POA: Diagnosis not present

## 2015-05-14 DIAGNOSIS — D696 Thrombocytopenia, unspecified: Secondary | ICD-10-CM | POA: Diagnosis not present

## 2015-05-14 DIAGNOSIS — I10 Essential (primary) hypertension: Secondary | ICD-10-CM | POA: Diagnosis not present

## 2015-05-14 DIAGNOSIS — E119 Type 2 diabetes mellitus without complications: Secondary | ICD-10-CM | POA: Diagnosis not present

## 2015-05-15 DIAGNOSIS — S81801D Unspecified open wound, right lower leg, subsequent encounter: Secondary | ICD-10-CM | POA: Diagnosis not present

## 2015-05-15 DIAGNOSIS — E039 Hypothyroidism, unspecified: Secondary | ICD-10-CM | POA: Diagnosis not present

## 2015-05-15 DIAGNOSIS — E876 Hypokalemia: Secondary | ICD-10-CM | POA: Diagnosis not present

## 2015-05-15 DIAGNOSIS — C9201 Acute myeloblastic leukemia, in remission: Secondary | ICD-10-CM | POA: Diagnosis not present

## 2015-05-15 DIAGNOSIS — K589 Irritable bowel syndrome without diarrhea: Secondary | ICD-10-CM | POA: Diagnosis not present

## 2015-05-15 DIAGNOSIS — R4701 Aphasia: Secondary | ICD-10-CM | POA: Diagnosis not present

## 2015-05-15 DIAGNOSIS — G4730 Sleep apnea, unspecified: Secondary | ICD-10-CM | POA: Diagnosis not present

## 2015-05-15 DIAGNOSIS — I2510 Atherosclerotic heart disease of native coronary artery without angina pectoris: Secondary | ICD-10-CM | POA: Diagnosis not present

## 2015-05-15 DIAGNOSIS — R251 Tremor, unspecified: Secondary | ICD-10-CM | POA: Diagnosis not present

## 2015-05-15 DIAGNOSIS — K219 Gastro-esophageal reflux disease without esophagitis: Secondary | ICD-10-CM | POA: Diagnosis not present

## 2015-05-15 DIAGNOSIS — M353 Polymyalgia rheumatica: Secondary | ICD-10-CM | POA: Diagnosis not present

## 2015-05-15 DIAGNOSIS — E119 Type 2 diabetes mellitus without complications: Secondary | ICD-10-CM | POA: Diagnosis not present

## 2015-05-15 DIAGNOSIS — Z794 Long term (current) use of insulin: Secondary | ICD-10-CM | POA: Diagnosis not present

## 2015-05-15 DIAGNOSIS — Z79899 Other long term (current) drug therapy: Secondary | ICD-10-CM | POA: Diagnosis not present

## 2015-05-15 DIAGNOSIS — D693 Immune thrombocytopenic purpura: Secondary | ICD-10-CM | POA: Diagnosis not present

## 2015-05-15 DIAGNOSIS — D696 Thrombocytopenia, unspecified: Secondary | ICD-10-CM | POA: Diagnosis not present

## 2015-05-15 DIAGNOSIS — I119 Hypertensive heart disease without heart failure: Secondary | ICD-10-CM | POA: Diagnosis not present

## 2015-05-18 ENCOUNTER — Other Ambulatory Visit (HOSPITAL_BASED_OUTPATIENT_CLINIC_OR_DEPARTMENT_OTHER): Payer: Medicare Other

## 2015-05-18 DIAGNOSIS — C9200 Acute myeloblastic leukemia, not having achieved remission: Secondary | ICD-10-CM

## 2015-05-18 LAB — COMPREHENSIVE METABOLIC PANEL
ALT: 45 U/L (ref 0–55)
ANION GAP: 9 meq/L (ref 3–11)
AST: 36 U/L — ABNORMAL HIGH (ref 5–34)
Albumin: 4 g/dL (ref 3.5–5.0)
Alkaline Phosphatase: 275 U/L — ABNORMAL HIGH (ref 40–150)
BUN: 20.9 mg/dL (ref 7.0–26.0)
CHLORIDE: 103 meq/L (ref 98–109)
CO2: 28 meq/L (ref 22–29)
Calcium: 9.8 mg/dL (ref 8.4–10.4)
Creatinine: 0.9 mg/dL (ref 0.7–1.3)
EGFR: 85 mL/min/{1.73_m2} — AB (ref >90)
GLUCOSE: 150 mg/dL — AB (ref 70–140)
Potassium: 4.7 mEq/L (ref 3.5–5.1)
SODIUM: 140 meq/L (ref 136–145)
TOTAL PROTEIN: 7.4 g/dL (ref 6.4–8.3)
Total Bilirubin: 0.99 mg/dL (ref 0.20–1.20)

## 2015-05-18 LAB — CBC WITH DIFFERENTIAL/PLATELET
BASO%: 0.4 % (ref 0.0–2.0)
Basophils Absolute: 0 10*3/uL (ref 0.0–0.1)
EOS%: 1.3 % (ref 0.0–7.0)
Eosinophils Absolute: 0.1 10*3/uL (ref 0.0–0.5)
HCT: 37.1 % — ABNORMAL LOW (ref 38.4–49.9)
HGB: 12.2 g/dL — ABNORMAL LOW (ref 13.0–17.1)
LYMPH%: 13.2 % — AB (ref 14.0–49.0)
MCH: 33.8 pg — ABNORMAL HIGH (ref 27.2–33.4)
MCHC: 33 g/dL (ref 32.0–36.0)
MCV: 102.7 fL — AB (ref 79.3–98.0)
MONO#: 2.5 10*3/uL — AB (ref 0.1–0.9)
MONO%: 43 % — AB (ref 0.0–14.0)
NEUT%: 42.1 % (ref 39.0–75.0)
NEUTROS ABS: 2.5 10*3/uL (ref 1.5–6.5)
PLATELETS: 56 10*3/uL — AB (ref 140–400)
RBC: 3.62 10*6/uL — AB (ref 4.20–5.82)
RDW: 22 % — ABNORMAL HIGH (ref 11.0–14.6)
WBC: 5.9 10*3/uL (ref 4.0–10.3)
lymph#: 0.8 10*3/uL — ABNORMAL LOW (ref 0.9–3.3)

## 2015-05-18 LAB — MAGNESIUM: Magnesium: 2.1 mg/dl (ref 1.5–2.5)

## 2015-05-19 ENCOUNTER — Telehealth: Payer: Self-pay | Admitting: *Deleted

## 2015-05-19 ENCOUNTER — Other Ambulatory Visit: Payer: Self-pay | Admitting: *Deleted

## 2015-05-19 DIAGNOSIS — C9200 Acute myeloblastic leukemia, not having achieved remission: Secondary | ICD-10-CM

## 2015-05-19 NOTE — Telephone Encounter (Signed)
Received fax from [place], Triage RN @ [facility] requesting weekly labs beginning 2/20. Pt's next visit and treatment with Dr. Florene Glen is scheduled for 2/10. Orders to schedulers to contact pt with lab appt. Per [facility] protocol, pt to be transfused PLT for count less than 20k. 2UPC for HGB less than 9.

## 2015-05-21 ENCOUNTER — Other Ambulatory Visit: Payer: BLUE CROSS/BLUE SHIELD

## 2015-05-21 DIAGNOSIS — Z955 Presence of coronary angioplasty implant and graft: Secondary | ICD-10-CM | POA: Diagnosis not present

## 2015-05-21 DIAGNOSIS — I10 Essential (primary) hypertension: Secondary | ICD-10-CM | POA: Diagnosis not present

## 2015-05-21 DIAGNOSIS — R569 Unspecified convulsions: Secondary | ICD-10-CM | POA: Diagnosis not present

## 2015-05-21 DIAGNOSIS — Z888 Allergy status to other drugs, medicaments and biological substances status: Secondary | ICD-10-CM | POA: Diagnosis not present

## 2015-05-21 DIAGNOSIS — L97811 Non-pressure chronic ulcer of other part of right lower leg limited to breakdown of skin: Secondary | ICD-10-CM | POA: Diagnosis not present

## 2015-05-21 DIAGNOSIS — Z881 Allergy status to other antibiotic agents status: Secondary | ICD-10-CM | POA: Diagnosis not present

## 2015-05-21 DIAGNOSIS — Z885 Allergy status to narcotic agent status: Secondary | ICD-10-CM | POA: Diagnosis not present

## 2015-05-21 DIAGNOSIS — M353 Polymyalgia rheumatica: Secondary | ICD-10-CM | POA: Diagnosis not present

## 2015-05-21 DIAGNOSIS — Z85828 Personal history of other malignant neoplasm of skin: Secondary | ICD-10-CM | POA: Diagnosis not present

## 2015-05-21 DIAGNOSIS — I878 Other specified disorders of veins: Secondary | ICD-10-CM | POA: Diagnosis not present

## 2015-05-21 DIAGNOSIS — K219 Gastro-esophageal reflux disease without esophagitis: Secondary | ICD-10-CM | POA: Diagnosis not present

## 2015-05-21 DIAGNOSIS — C92Z0 Other myeloid leukemia not having achieved remission: Secondary | ICD-10-CM | POA: Diagnosis not present

## 2015-05-21 DIAGNOSIS — I890 Lymphedema, not elsewhere classified: Secondary | ICD-10-CM | POA: Diagnosis not present

## 2015-05-21 DIAGNOSIS — I2510 Atherosclerotic heart disease of native coronary artery without angina pectoris: Secondary | ICD-10-CM | POA: Diagnosis not present

## 2015-05-21 DIAGNOSIS — E039 Hypothyroidism, unspecified: Secondary | ICD-10-CM | POA: Diagnosis not present

## 2015-05-21 DIAGNOSIS — L97911 Non-pressure chronic ulcer of unspecified part of right lower leg limited to breakdown of skin: Secondary | ICD-10-CM | POA: Diagnosis not present

## 2015-05-21 DIAGNOSIS — D696 Thrombocytopenia, unspecified: Secondary | ICD-10-CM | POA: Diagnosis not present

## 2015-05-21 DIAGNOSIS — E119 Type 2 diabetes mellitus without complications: Secondary | ICD-10-CM | POA: Diagnosis not present

## 2015-05-22 ENCOUNTER — Telehealth: Payer: Self-pay | Admitting: Oncology

## 2015-05-22 ENCOUNTER — Other Ambulatory Visit: Payer: Medicare Other

## 2015-05-22 NOTE — Telephone Encounter (Signed)
S/w pt confirming labs per 01/31 POF, mailed out schedule per pt's request... KJ

## 2015-05-28 DIAGNOSIS — Z48817 Encounter for surgical aftercare following surgery on the skin and subcutaneous tissue: Secondary | ICD-10-CM | POA: Diagnosis not present

## 2015-05-29 DIAGNOSIS — K219 Gastro-esophageal reflux disease without esophagitis: Secondary | ICD-10-CM | POA: Diagnosis not present

## 2015-05-29 DIAGNOSIS — I10 Essential (primary) hypertension: Secondary | ICD-10-CM | POA: Diagnosis not present

## 2015-05-29 DIAGNOSIS — M353 Polymyalgia rheumatica: Secondary | ICD-10-CM | POA: Diagnosis not present

## 2015-05-29 DIAGNOSIS — Z794 Long term (current) use of insulin: Secondary | ICD-10-CM | POA: Diagnosis not present

## 2015-05-29 DIAGNOSIS — Z7952 Long term (current) use of systemic steroids: Secondary | ICD-10-CM | POA: Diagnosis not present

## 2015-05-29 DIAGNOSIS — C9201 Acute myeloblastic leukemia, in remission: Secondary | ICD-10-CM | POA: Diagnosis not present

## 2015-05-29 DIAGNOSIS — Z79899 Other long term (current) drug therapy: Secondary | ICD-10-CM | POA: Diagnosis not present

## 2015-05-29 DIAGNOSIS — I2510 Atherosclerotic heart disease of native coronary artery without angina pectoris: Secondary | ICD-10-CM | POA: Diagnosis not present

## 2015-05-29 DIAGNOSIS — M810 Age-related osteoporosis without current pathological fracture: Secondary | ICD-10-CM | POA: Diagnosis not present

## 2015-05-29 DIAGNOSIS — E119 Type 2 diabetes mellitus without complications: Secondary | ICD-10-CM | POA: Diagnosis not present

## 2015-05-29 DIAGNOSIS — E039 Hypothyroidism, unspecified: Secondary | ICD-10-CM | POA: Diagnosis not present

## 2015-05-29 DIAGNOSIS — C9200 Acute myeloblastic leukemia, not having achieved remission: Secondary | ICD-10-CM | POA: Diagnosis not present

## 2015-05-29 DIAGNOSIS — E876 Hypokalemia: Secondary | ICD-10-CM | POA: Diagnosis not present

## 2015-06-08 ENCOUNTER — Other Ambulatory Visit (HOSPITAL_BASED_OUTPATIENT_CLINIC_OR_DEPARTMENT_OTHER): Payer: Medicare Other

## 2015-06-08 DIAGNOSIS — E039 Hypothyroidism, unspecified: Secondary | ICD-10-CM | POA: Diagnosis not present

## 2015-06-08 DIAGNOSIS — C9200 Acute myeloblastic leukemia, not having achieved remission: Secondary | ICD-10-CM | POA: Diagnosis present

## 2015-06-08 DIAGNOSIS — I10 Essential (primary) hypertension: Secondary | ICD-10-CM | POA: Diagnosis not present

## 2015-06-08 DIAGNOSIS — I2510 Atherosclerotic heart disease of native coronary artery without angina pectoris: Secondary | ICD-10-CM | POA: Diagnosis not present

## 2015-06-08 DIAGNOSIS — I872 Venous insufficiency (chronic) (peripheral): Secondary | ICD-10-CM | POA: Diagnosis not present

## 2015-06-08 DIAGNOSIS — E119 Type 2 diabetes mellitus without complications: Secondary | ICD-10-CM | POA: Diagnosis not present

## 2015-06-08 DIAGNOSIS — I83019 Varicose veins of right lower extremity with ulcer of unspecified site: Secondary | ICD-10-CM | POA: Diagnosis not present

## 2015-06-08 DIAGNOSIS — I890 Lymphedema, not elsewhere classified: Secondary | ICD-10-CM | POA: Diagnosis not present

## 2015-06-08 LAB — CBC WITH DIFFERENTIAL/PLATELET
BASO%: 0.5 % (ref 0.0–2.0)
Basophils Absolute: 0 10*3/uL (ref 0.0–0.1)
EOS%: 1.3 % (ref 0.0–7.0)
Eosinophils Absolute: 0.1 10*3/uL (ref 0.0–0.5)
HCT: 37.1 % — ABNORMAL LOW (ref 38.4–49.9)
HGB: 12.2 g/dL — ABNORMAL LOW (ref 13.0–17.1)
LYMPH%: 17 % (ref 14.0–49.0)
MCH: 34.6 pg — ABNORMAL HIGH (ref 27.2–33.4)
MCHC: 32.8 g/dL (ref 32.0–36.0)
MCV: 105.5 fL — AB (ref 79.3–98.0)
MONO#: 1.8 10*3/uL — AB (ref 0.1–0.9)
MONO%: 42.4 % — AB (ref 0.0–14.0)
NEUT#: 1.6 10*3/uL (ref 1.5–6.5)
NEUT%: 38.8 % — AB (ref 39.0–75.0)
PLATELETS: 63 10*3/uL — AB (ref 140–400)
RBC: 3.52 10*6/uL — AB (ref 4.20–5.82)
RDW: 17.5 % — ABNORMAL HIGH (ref 11.0–14.6)
WBC: 4.2 10*3/uL (ref 4.0–10.3)
lymph#: 0.7 10*3/uL — ABNORMAL LOW (ref 0.9–3.3)

## 2015-06-08 LAB — MAGNESIUM: MAGNESIUM: 2.1 mg/dL (ref 1.5–2.5)

## 2015-06-08 LAB — COMPREHENSIVE METABOLIC PANEL
ALT: 84 U/L — ABNORMAL HIGH (ref 0–55)
ANION GAP: 9 meq/L (ref 3–11)
AST: 58 U/L — ABNORMAL HIGH (ref 5–34)
Albumin: 3.8 g/dL (ref 3.5–5.0)
Alkaline Phosphatase: 244 U/L — ABNORMAL HIGH (ref 40–150)
BUN: 19.3 mg/dL (ref 7.0–26.0)
CHLORIDE: 106 meq/L (ref 98–109)
CO2: 26 meq/L (ref 22–29)
CREATININE: 0.8 mg/dL (ref 0.7–1.3)
Calcium: 9.5 mg/dL (ref 8.4–10.4)
EGFR: 88 mL/min/{1.73_m2} — AB (ref >90)
Glucose: 108 mg/dl (ref 70–140)
POTASSIUM: 4.3 meq/L (ref 3.5–5.1)
SODIUM: 141 meq/L (ref 136–145)
Total Bilirubin: 0.78 mg/dL (ref 0.20–1.20)
Total Protein: 7 g/dL (ref 6.4–8.3)

## 2015-06-09 DIAGNOSIS — E039 Hypothyroidism, unspecified: Secondary | ICD-10-CM | POA: Diagnosis not present

## 2015-06-09 DIAGNOSIS — D649 Anemia, unspecified: Secondary | ICD-10-CM | POA: Diagnosis not present

## 2015-06-09 DIAGNOSIS — C9200 Acute myeloblastic leukemia, not having achieved remission: Secondary | ICD-10-CM | POA: Diagnosis not present

## 2015-06-09 DIAGNOSIS — D696 Thrombocytopenia, unspecified: Secondary | ICD-10-CM | POA: Diagnosis not present

## 2015-06-09 DIAGNOSIS — Z7952 Long term (current) use of systemic steroids: Secondary | ICD-10-CM | POA: Diagnosis not present

## 2015-06-09 DIAGNOSIS — I10 Essential (primary) hypertension: Secondary | ICD-10-CM | POA: Diagnosis not present

## 2015-06-09 DIAGNOSIS — K5900 Constipation, unspecified: Secondary | ICD-10-CM | POA: Diagnosis not present

## 2015-06-09 DIAGNOSIS — G4700 Insomnia, unspecified: Secondary | ICD-10-CM | POA: Diagnosis not present

## 2015-06-09 DIAGNOSIS — G252 Other specified forms of tremor: Secondary | ICD-10-CM | POA: Diagnosis not present

## 2015-06-09 DIAGNOSIS — D61818 Other pancytopenia: Secondary | ICD-10-CM | POA: Diagnosis not present

## 2015-06-09 DIAGNOSIS — E876 Hypokalemia: Secondary | ICD-10-CM | POA: Diagnosis not present

## 2015-06-09 DIAGNOSIS — Z9889 Other specified postprocedural states: Secondary | ICD-10-CM | POA: Diagnosis not present

## 2015-06-09 DIAGNOSIS — Z95820 Peripheral vascular angioplasty status with implants and grafts: Secondary | ICD-10-CM | POA: Diagnosis not present

## 2015-06-09 DIAGNOSIS — R4701 Aphasia: Secondary | ICD-10-CM | POA: Diagnosis not present

## 2015-06-09 DIAGNOSIS — M353 Polymyalgia rheumatica: Secondary | ICD-10-CM | POA: Diagnosis not present

## 2015-06-09 DIAGNOSIS — K581 Irritable bowel syndrome with constipation: Secondary | ICD-10-CM | POA: Diagnosis not present

## 2015-06-09 DIAGNOSIS — C9201 Acute myeloblastic leukemia, in remission: Secondary | ICD-10-CM | POA: Diagnosis not present

## 2015-06-09 DIAGNOSIS — R600 Localized edema: Secondary | ICD-10-CM | POA: Diagnosis not present

## 2015-06-10 DIAGNOSIS — C9200 Acute myeloblastic leukemia, not having achieved remission: Secondary | ICD-10-CM | POA: Diagnosis not present

## 2015-06-12 DIAGNOSIS — D696 Thrombocytopenia, unspecified: Secondary | ICD-10-CM | POA: Diagnosis not present

## 2015-06-12 DIAGNOSIS — C9200 Acute myeloblastic leukemia, not having achieved remission: Secondary | ICD-10-CM | POA: Diagnosis not present

## 2015-06-15 ENCOUNTER — Other Ambulatory Visit: Payer: Medicare Other

## 2015-06-15 DIAGNOSIS — I878 Other specified disorders of veins: Secondary | ICD-10-CM | POA: Diagnosis not present

## 2015-06-15 DIAGNOSIS — E1169 Type 2 diabetes mellitus with other specified complication: Secondary | ICD-10-CM | POA: Diagnosis not present

## 2015-06-15 DIAGNOSIS — Z794 Long term (current) use of insulin: Secondary | ICD-10-CM | POA: Diagnosis not present

## 2015-06-15 DIAGNOSIS — I890 Lymphedema, not elsewhere classified: Secondary | ICD-10-CM | POA: Diagnosis not present

## 2015-06-15 DIAGNOSIS — I10 Essential (primary) hypertension: Secondary | ICD-10-CM | POA: Diagnosis not present

## 2015-06-15 DIAGNOSIS — L97911 Non-pressure chronic ulcer of unspecified part of right lower leg limited to breakdown of skin: Secondary | ICD-10-CM | POA: Diagnosis not present

## 2015-06-15 DIAGNOSIS — Z48817 Encounter for surgical aftercare following surgery on the skin and subcutaneous tissue: Secondary | ICD-10-CM | POA: Diagnosis not present

## 2015-06-16 DIAGNOSIS — I2510 Atherosclerotic heart disease of native coronary artery without angina pectoris: Secondary | ICD-10-CM | POA: Diagnosis not present

## 2015-06-16 DIAGNOSIS — E876 Hypokalemia: Secondary | ICD-10-CM | POA: Diagnosis not present

## 2015-06-16 DIAGNOSIS — E039 Hypothyroidism, unspecified: Secondary | ICD-10-CM | POA: Diagnosis not present

## 2015-06-16 DIAGNOSIS — Z8719 Personal history of other diseases of the digestive system: Secondary | ICD-10-CM | POA: Diagnosis not present

## 2015-06-16 DIAGNOSIS — C9201 Acute myeloblastic leukemia, in remission: Secondary | ICD-10-CM | POA: Diagnosis not present

## 2015-06-16 DIAGNOSIS — I517 Cardiomegaly: Secondary | ICD-10-CM | POA: Diagnosis not present

## 2015-06-16 DIAGNOSIS — R4701 Aphasia: Secondary | ICD-10-CM | POA: Diagnosis not present

## 2015-06-16 DIAGNOSIS — E119 Type 2 diabetes mellitus without complications: Secondary | ICD-10-CM | POA: Diagnosis not present

## 2015-06-16 DIAGNOSIS — Z9221 Personal history of antineoplastic chemotherapy: Secondary | ICD-10-CM | POA: Diagnosis not present

## 2015-06-16 DIAGNOSIS — I10 Essential (primary) hypertension: Secondary | ICD-10-CM | POA: Diagnosis not present

## 2015-06-16 DIAGNOSIS — S81801D Unspecified open wound, right lower leg, subsequent encounter: Secondary | ICD-10-CM | POA: Diagnosis not present

## 2015-06-16 DIAGNOSIS — C9200 Acute myeloblastic leukemia, not having achieved remission: Secondary | ICD-10-CM | POA: Diagnosis not present

## 2015-06-16 DIAGNOSIS — Z794 Long term (current) use of insulin: Secondary | ICD-10-CM | POA: Diagnosis not present

## 2015-06-16 DIAGNOSIS — Z79899 Other long term (current) drug therapy: Secondary | ICD-10-CM | POA: Diagnosis not present

## 2015-06-16 DIAGNOSIS — G4700 Insomnia, unspecified: Secondary | ICD-10-CM | POA: Diagnosis not present

## 2015-06-16 DIAGNOSIS — K5900 Constipation, unspecified: Secondary | ICD-10-CM | POA: Diagnosis not present

## 2015-06-16 DIAGNOSIS — M353 Polymyalgia rheumatica: Secondary | ICD-10-CM | POA: Diagnosis not present

## 2015-06-16 DIAGNOSIS — R251 Tremor, unspecified: Secondary | ICD-10-CM | POA: Diagnosis not present

## 2015-06-17 DIAGNOSIS — I10 Essential (primary) hypertension: Secondary | ICD-10-CM | POA: Diagnosis not present

## 2015-06-17 DIAGNOSIS — C9200 Acute myeloblastic leukemia, not having achieved remission: Secondary | ICD-10-CM | POA: Diagnosis not present

## 2015-06-17 DIAGNOSIS — C9201 Acute myeloblastic leukemia, in remission: Secondary | ICD-10-CM | POA: Diagnosis not present

## 2015-06-17 DIAGNOSIS — I517 Cardiomegaly: Secondary | ICD-10-CM | POA: Diagnosis not present

## 2015-06-17 DIAGNOSIS — E039 Hypothyroidism, unspecified: Secondary | ICD-10-CM | POA: Diagnosis not present

## 2015-06-17 DIAGNOSIS — M353 Polymyalgia rheumatica: Secondary | ICD-10-CM | POA: Diagnosis not present

## 2015-06-17 DIAGNOSIS — K5900 Constipation, unspecified: Secondary | ICD-10-CM | POA: Diagnosis not present

## 2015-06-19 DIAGNOSIS — Z9221 Personal history of antineoplastic chemotherapy: Secondary | ICD-10-CM | POA: Diagnosis not present

## 2015-06-19 DIAGNOSIS — M353 Polymyalgia rheumatica: Secondary | ICD-10-CM | POA: Diagnosis not present

## 2015-06-19 DIAGNOSIS — C9200 Acute myeloblastic leukemia, not having achieved remission: Secondary | ICD-10-CM | POA: Diagnosis not present

## 2015-06-19 DIAGNOSIS — K5900 Constipation, unspecified: Secondary | ICD-10-CM | POA: Diagnosis not present

## 2015-06-19 DIAGNOSIS — Z9889 Other specified postprocedural states: Secondary | ICD-10-CM | POA: Diagnosis not present

## 2015-06-19 DIAGNOSIS — I10 Essential (primary) hypertension: Secondary | ICD-10-CM | POA: Diagnosis not present

## 2015-06-19 DIAGNOSIS — Z79899 Other long term (current) drug therapy: Secondary | ICD-10-CM | POA: Diagnosis not present

## 2015-06-19 DIAGNOSIS — E876 Hypokalemia: Secondary | ICD-10-CM | POA: Diagnosis not present

## 2015-06-19 DIAGNOSIS — G4700 Insomnia, unspecified: Secondary | ICD-10-CM | POA: Diagnosis not present

## 2015-06-19 DIAGNOSIS — E039 Hypothyroidism, unspecified: Secondary | ICD-10-CM | POA: Diagnosis not present

## 2015-06-19 DIAGNOSIS — Z7952 Long term (current) use of systemic steroids: Secondary | ICD-10-CM | POA: Diagnosis not present

## 2015-06-19 DIAGNOSIS — E119 Type 2 diabetes mellitus without complications: Secondary | ICD-10-CM | POA: Diagnosis not present

## 2015-06-19 DIAGNOSIS — Z794 Long term (current) use of insulin: Secondary | ICD-10-CM | POA: Diagnosis not present

## 2015-06-19 DIAGNOSIS — I2510 Atherosclerotic heart disease of native coronary artery without angina pectoris: Secondary | ICD-10-CM | POA: Diagnosis not present

## 2015-06-22 ENCOUNTER — Other Ambulatory Visit: Payer: Medicare Other

## 2015-06-22 DIAGNOSIS — I878 Other specified disorders of veins: Secondary | ICD-10-CM | POA: Diagnosis not present

## 2015-06-22 DIAGNOSIS — E119 Type 2 diabetes mellitus without complications: Secondary | ICD-10-CM | POA: Diagnosis not present

## 2015-06-22 DIAGNOSIS — I890 Lymphedema, not elsewhere classified: Secondary | ICD-10-CM | POA: Diagnosis not present

## 2015-06-22 DIAGNOSIS — D696 Thrombocytopenia, unspecified: Secondary | ICD-10-CM | POA: Diagnosis not present

## 2015-06-22 DIAGNOSIS — Z48817 Encounter for surgical aftercare following surgery on the skin and subcutaneous tissue: Secondary | ICD-10-CM | POA: Diagnosis not present

## 2015-06-22 DIAGNOSIS — L97911 Non-pressure chronic ulcer of unspecified part of right lower leg limited to breakdown of skin: Secondary | ICD-10-CM | POA: Diagnosis not present

## 2015-06-22 DIAGNOSIS — E1169 Type 2 diabetes mellitus with other specified complication: Secondary | ICD-10-CM | POA: Diagnosis not present

## 2015-06-22 DIAGNOSIS — Z794 Long term (current) use of insulin: Secondary | ICD-10-CM | POA: Diagnosis not present

## 2015-06-22 DIAGNOSIS — I2510 Atherosclerotic heart disease of native coronary artery without angina pectoris: Secondary | ICD-10-CM | POA: Diagnosis not present

## 2015-06-22 DIAGNOSIS — I10 Essential (primary) hypertension: Secondary | ICD-10-CM | POA: Diagnosis not present

## 2015-06-22 DIAGNOSIS — D61810 Antineoplastic chemotherapy induced pancytopenia: Secondary | ICD-10-CM | POA: Diagnosis not present

## 2015-06-22 DIAGNOSIS — E039 Hypothyroidism, unspecified: Secondary | ICD-10-CM | POA: Diagnosis not present

## 2015-06-23 DIAGNOSIS — C9200 Acute myeloblastic leukemia, not having achieved remission: Secondary | ICD-10-CM | POA: Diagnosis not present

## 2015-06-23 DIAGNOSIS — C9201 Acute myeloblastic leukemia, in remission: Secondary | ICD-10-CM | POA: Diagnosis not present

## 2015-06-26 DIAGNOSIS — Z79899 Other long term (current) drug therapy: Secondary | ICD-10-CM | POA: Diagnosis not present

## 2015-06-26 DIAGNOSIS — Z794 Long term (current) use of insulin: Secondary | ICD-10-CM | POA: Diagnosis not present

## 2015-06-26 DIAGNOSIS — G4700 Insomnia, unspecified: Secondary | ICD-10-CM | POA: Diagnosis present

## 2015-06-26 DIAGNOSIS — T2104XA Burn of unspecified degree of lower back, initial encounter: Secondary | ICD-10-CM | POA: Diagnosis present

## 2015-06-26 DIAGNOSIS — M549 Dorsalgia, unspecified: Secondary | ICD-10-CM | POA: Diagnosis not present

## 2015-06-26 DIAGNOSIS — D61818 Other pancytopenia: Secondary | ICD-10-CM | POA: Diagnosis not present

## 2015-06-26 DIAGNOSIS — E119 Type 2 diabetes mellitus without complications: Secondary | ICD-10-CM | POA: Diagnosis not present

## 2015-06-26 DIAGNOSIS — D61810 Antineoplastic chemotherapy induced pancytopenia: Secondary | ICD-10-CM | POA: Diagnosis not present

## 2015-06-26 DIAGNOSIS — Z885 Allergy status to narcotic agent status: Secondary | ICD-10-CM | POA: Diagnosis not present

## 2015-06-26 DIAGNOSIS — I82409 Acute embolism and thrombosis of unspecified deep veins of unspecified lower extremity: Secondary | ICD-10-CM | POA: Diagnosis not present

## 2015-06-26 DIAGNOSIS — Z79891 Long term (current) use of opiate analgesic: Secondary | ICD-10-CM | POA: Diagnosis not present

## 2015-06-26 DIAGNOSIS — Z880 Allergy status to penicillin: Secondary | ICD-10-CM | POA: Diagnosis not present

## 2015-06-26 DIAGNOSIS — C9200 Acute myeloblastic leukemia, not having achieved remission: Secondary | ICD-10-CM | POA: Diagnosis present

## 2015-06-26 DIAGNOSIS — I10 Essential (primary) hypertension: Secondary | ICD-10-CM | POA: Diagnosis present

## 2015-06-26 DIAGNOSIS — M790 Rheumatism, unspecified: Secondary | ICD-10-CM | POA: Diagnosis not present

## 2015-06-26 DIAGNOSIS — E039 Hypothyroidism, unspecified: Secondary | ICD-10-CM | POA: Diagnosis not present

## 2015-06-26 DIAGNOSIS — Z91048 Other nonmedicinal substance allergy status: Secondary | ICD-10-CM | POA: Diagnosis not present

## 2015-06-26 DIAGNOSIS — R4701 Aphasia: Secondary | ICD-10-CM | POA: Diagnosis not present

## 2015-06-26 DIAGNOSIS — Z7952 Long term (current) use of systemic steroids: Secondary | ICD-10-CM | POA: Diagnosis not present

## 2015-06-26 DIAGNOSIS — Z888 Allergy status to other drugs, medicaments and biological substances status: Secondary | ICD-10-CM | POA: Diagnosis not present

## 2015-06-26 DIAGNOSIS — M353 Polymyalgia rheumatica: Secondary | ICD-10-CM | POA: Diagnosis not present

## 2015-06-26 DIAGNOSIS — Z881 Allergy status to other antibiotic agents status: Secondary | ICD-10-CM | POA: Diagnosis not present

## 2015-06-26 DIAGNOSIS — T300 Burn of unspecified body region, unspecified degree: Secondary | ICD-10-CM | POA: Diagnosis not present

## 2015-06-29 ENCOUNTER — Other Ambulatory Visit: Payer: Medicare Other

## 2015-07-03 DIAGNOSIS — Z9889 Other specified postprocedural states: Secondary | ICD-10-CM | POA: Diagnosis not present

## 2015-07-03 DIAGNOSIS — Z79899 Other long term (current) drug therapy: Secondary | ICD-10-CM | POA: Diagnosis not present

## 2015-07-03 DIAGNOSIS — D649 Anemia, unspecified: Secondary | ICD-10-CM | POA: Diagnosis not present

## 2015-07-03 DIAGNOSIS — R251 Tremor, unspecified: Secondary | ICD-10-CM | POA: Diagnosis not present

## 2015-07-03 DIAGNOSIS — E876 Hypokalemia: Secondary | ICD-10-CM | POA: Diagnosis not present

## 2015-07-03 DIAGNOSIS — I10 Essential (primary) hypertension: Secondary | ICD-10-CM | POA: Diagnosis not present

## 2015-07-03 DIAGNOSIS — G4700 Insomnia, unspecified: Secondary | ICD-10-CM | POA: Diagnosis not present

## 2015-07-03 DIAGNOSIS — I2510 Atherosclerotic heart disease of native coronary artery without angina pectoris: Secondary | ICD-10-CM | POA: Diagnosis not present

## 2015-07-03 DIAGNOSIS — Z794 Long term (current) use of insulin: Secondary | ICD-10-CM | POA: Diagnosis not present

## 2015-07-03 DIAGNOSIS — C9201 Acute myeloblastic leukemia, in remission: Secondary | ICD-10-CM | POA: Diagnosis not present

## 2015-07-03 DIAGNOSIS — Z8719 Personal history of other diseases of the digestive system: Secondary | ICD-10-CM | POA: Diagnosis not present

## 2015-07-03 DIAGNOSIS — Z5111 Encounter for antineoplastic chemotherapy: Secondary | ICD-10-CM | POA: Diagnosis not present

## 2015-07-03 DIAGNOSIS — E119 Type 2 diabetes mellitus without complications: Secondary | ICD-10-CM | POA: Diagnosis not present

## 2015-07-03 DIAGNOSIS — E039 Hypothyroidism, unspecified: Secondary | ICD-10-CM | POA: Diagnosis not present

## 2015-07-03 DIAGNOSIS — M353 Polymyalgia rheumatica: Secondary | ICD-10-CM | POA: Diagnosis not present

## 2015-07-03 DIAGNOSIS — K5900 Constipation, unspecified: Secondary | ICD-10-CM | POA: Diagnosis not present

## 2015-07-03 DIAGNOSIS — R4701 Aphasia: Secondary | ICD-10-CM | POA: Diagnosis not present

## 2015-07-03 DIAGNOSIS — C9200 Acute myeloblastic leukemia, not having achieved remission: Secondary | ICD-10-CM | POA: Diagnosis not present

## 2015-07-06 ENCOUNTER — Other Ambulatory Visit: Payer: Medicare Other

## 2015-07-07 ENCOUNTER — Telehealth: Payer: Self-pay | Admitting: *Deleted

## 2015-07-07 DIAGNOSIS — E876 Hypokalemia: Secondary | ICD-10-CM | POA: Diagnosis present

## 2015-07-07 DIAGNOSIS — D61810 Antineoplastic chemotherapy induced pancytopenia: Secondary | ICD-10-CM | POA: Diagnosis not present

## 2015-07-07 DIAGNOSIS — K581 Irritable bowel syndrome with constipation: Secondary | ICD-10-CM | POA: Diagnosis not present

## 2015-07-07 DIAGNOSIS — M790 Rheumatism, unspecified: Secondary | ICD-10-CM | POA: Diagnosis not present

## 2015-07-07 DIAGNOSIS — E039 Hypothyroidism, unspecified: Secondary | ICD-10-CM | POA: Diagnosis not present

## 2015-07-07 DIAGNOSIS — Z823 Family history of stroke: Secondary | ICD-10-CM | POA: Diagnosis not present

## 2015-07-07 DIAGNOSIS — T451X5A Adverse effect of antineoplastic and immunosuppressive drugs, initial encounter: Secondary | ICD-10-CM | POA: Diagnosis present

## 2015-07-07 DIAGNOSIS — I2510 Atherosclerotic heart disease of native coronary artery without angina pectoris: Secondary | ICD-10-CM | POA: Diagnosis present

## 2015-07-07 DIAGNOSIS — Z794 Long term (current) use of insulin: Secondary | ICD-10-CM | POA: Diagnosis not present

## 2015-07-07 DIAGNOSIS — M353 Polymyalgia rheumatica: Secondary | ICD-10-CM | POA: Diagnosis not present

## 2015-07-07 DIAGNOSIS — R600 Localized edema: Secondary | ICD-10-CM | POA: Diagnosis not present

## 2015-07-07 DIAGNOSIS — M810 Age-related osteoporosis without current pathological fracture: Secondary | ICD-10-CM | POA: Diagnosis present

## 2015-07-07 DIAGNOSIS — Z833 Family history of diabetes mellitus: Secondary | ICD-10-CM | POA: Diagnosis not present

## 2015-07-07 DIAGNOSIS — I82409 Acute embolism and thrombosis of unspecified deep veins of unspecified lower extremity: Secondary | ICD-10-CM | POA: Diagnosis not present

## 2015-07-07 DIAGNOSIS — G4700 Insomnia, unspecified: Secondary | ICD-10-CM | POA: Diagnosis not present

## 2015-07-07 DIAGNOSIS — C9201 Acute myeloblastic leukemia, in remission: Secondary | ICD-10-CM | POA: Diagnosis not present

## 2015-07-07 DIAGNOSIS — I10 Essential (primary) hypertension: Secondary | ICD-10-CM | POA: Diagnosis not present

## 2015-07-07 DIAGNOSIS — K219 Gastro-esophageal reflux disease without esophagitis: Secondary | ICD-10-CM | POA: Diagnosis present

## 2015-07-07 DIAGNOSIS — K589 Irritable bowel syndrome without diarrhea: Secondary | ICD-10-CM | POA: Diagnosis present

## 2015-07-07 DIAGNOSIS — E119 Type 2 diabetes mellitus without complications: Secondary | ICD-10-CM | POA: Diagnosis not present

## 2015-07-07 DIAGNOSIS — Z8249 Family history of ischemic heart disease and other diseases of the circulatory system: Secondary | ICD-10-CM | POA: Diagnosis not present

## 2015-07-07 DIAGNOSIS — Z5111 Encounter for antineoplastic chemotherapy: Secondary | ICD-10-CM | POA: Diagnosis not present

## 2015-07-07 DIAGNOSIS — R51 Headache: Secondary | ICD-10-CM | POA: Diagnosis not present

## 2015-07-07 DIAGNOSIS — R251 Tremor, unspecified: Secondary | ICD-10-CM | POA: Diagnosis not present

## 2015-07-07 DIAGNOSIS — Z825 Family history of asthma and other chronic lower respiratory diseases: Secondary | ICD-10-CM | POA: Diagnosis not present

## 2015-07-07 DIAGNOSIS — C9250 Acute myelomonocytic leukemia, not having achieved remission: Secondary | ICD-10-CM | POA: Diagnosis not present

## 2015-07-07 DIAGNOSIS — C9251 Acute myelomonocytic leukemia, in remission: Secondary | ICD-10-CM | POA: Diagnosis present

## 2015-07-07 DIAGNOSIS — C9200 Acute myeloblastic leukemia, not having achieved remission: Secondary | ICD-10-CM

## 2015-07-07 NOTE — Telephone Encounter (Signed)
Call from [place] at [facility] requesting appointments for bi-weekly labs to begin on 3/28 and Neulasta injection that day. Reviewed with Dr. Benay Spice: OK to schedule lab/injection. Pt will need to be seen in office that day. POF to schedulers for appointments.  Ria Comment requests appointments to be faxed to [phone]. Plan is to discharge pt on 3/26.

## 2015-07-08 ENCOUNTER — Telehealth: Payer: Self-pay | Admitting: Oncology

## 2015-07-08 NOTE — Telephone Encounter (Signed)
s.w. pt wife adn advised on march and april appts...emailed tonya to get inj dates

## 2015-07-08 NOTE — Telephone Encounter (Signed)
lvm for pt and advised on inj on 3.28 per MD....pt ok adn aware of d.t

## 2015-07-14 ENCOUNTER — Ambulatory Visit: Payer: Medicare Other

## 2015-07-14 ENCOUNTER — Telehealth: Payer: Self-pay | Admitting: Nurse Practitioner

## 2015-07-14 ENCOUNTER — Ambulatory Visit (HOSPITAL_BASED_OUTPATIENT_CLINIC_OR_DEPARTMENT_OTHER): Payer: Medicare Other | Admitting: Nurse Practitioner

## 2015-07-14 ENCOUNTER — Other Ambulatory Visit (HOSPITAL_BASED_OUTPATIENT_CLINIC_OR_DEPARTMENT_OTHER): Payer: Medicare Other

## 2015-07-14 VITALS — BP 126/98 | HR 86 | Temp 98.4°F | Resp 17 | Wt 204.2 lb

## 2015-07-14 DIAGNOSIS — C9200 Acute myeloblastic leukemia, not having achieved remission: Secondary | ICD-10-CM | POA: Diagnosis not present

## 2015-07-14 DIAGNOSIS — Z5189 Encounter for other specified aftercare: Secondary | ICD-10-CM | POA: Diagnosis not present

## 2015-07-14 DIAGNOSIS — C9201 Acute myeloblastic leukemia, in remission: Secondary | ICD-10-CM | POA: Diagnosis present

## 2015-07-14 LAB — COMPREHENSIVE METABOLIC PANEL
ALT: 35 U/L (ref 0–55)
ANION GAP: 10 meq/L (ref 3–11)
AST: 28 U/L (ref 5–34)
Albumin: 3.6 g/dL (ref 3.5–5.0)
Alkaline Phosphatase: 241 U/L — ABNORMAL HIGH (ref 40–150)
BILIRUBIN TOTAL: 1.01 mg/dL (ref 0.20–1.20)
BUN: 21 mg/dL (ref 7.0–26.0)
CHLORIDE: 105 meq/L (ref 98–109)
CO2: 24 meq/L (ref 22–29)
Calcium: 9.6 mg/dL (ref 8.4–10.4)
Creatinine: 0.8 mg/dL (ref 0.7–1.3)
EGFR: 88 mL/min/{1.73_m2} — AB (ref >90)
Glucose: 133 mg/dl (ref 70–140)
Potassium: 4.4 mEq/L (ref 3.5–5.1)
SODIUM: 139 meq/L (ref 136–145)
TOTAL PROTEIN: 7.2 g/dL (ref 6.4–8.3)

## 2015-07-14 LAB — CBC WITH DIFFERENTIAL/PLATELET
BASO%: 0 % (ref 0.0–2.0)
BASOS ABS: 0 10*3/uL (ref 0.0–0.1)
EOS%: 0 % (ref 0.0–7.0)
Eosinophils Absolute: 0 10*3/uL (ref 0.0–0.5)
HCT: 36.1 % — ABNORMAL LOW (ref 38.4–49.9)
HGB: 12 g/dL — ABNORMAL LOW (ref 13.0–17.1)
LYMPH%: 5.2 % — AB (ref 14.0–49.0)
MCH: 34.7 pg — ABNORMAL HIGH (ref 27.2–33.4)
MCHC: 33.2 g/dL (ref 32.0–36.0)
MCV: 104.3 fL — AB (ref 79.3–98.0)
MONO#: 0 10*3/uL — ABNORMAL LOW (ref 0.1–0.9)
MONO%: 1.1 % (ref 0.0–14.0)
NEUT%: 93.7 % — ABNORMAL HIGH (ref 39.0–75.0)
NEUTROS ABS: 1.6 10*3/uL (ref 1.5–6.5)
NRBC: 0 % (ref 0–0)
PLATELETS: 70 10*3/uL — AB (ref 140–400)
RBC: 3.46 10*6/uL — AB (ref 4.20–5.82)
RDW: 15.9 % — AB (ref 11.0–14.6)
WBC: 1.7 10*3/uL — AB (ref 4.0–10.3)
lymph#: 0.1 10*3/uL — ABNORMAL LOW (ref 0.9–3.3)

## 2015-07-14 LAB — MAGNESIUM: Magnesium: 2.1 mg/dl (ref 1.5–2.5)

## 2015-07-14 MED ORDER — PEGFILGRASTIM INJECTION 6 MG/0.6ML
6.0000 mg | Freq: Once | SUBCUTANEOUS | Status: AC
  Administered 2015-07-14: 6 mg via SUBCUTANEOUS
  Filled 2015-07-14: qty 0.6

## 2015-07-14 NOTE — Telephone Encounter (Signed)
per pof to sch pt appt-CX appts per pof-pt aware

## 2015-07-14 NOTE — Progress Notes (Addendum)
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 Treatment outlined below. He denies pain. His main complaint is fatigue. Overall good appetite. No fever, chills or sweats. No shortness of breath or cough. No bleeding. He has a wound at the right lower leg and is followed at the wound clinic at [facility].  Objective:  Vital signs in last 24 hours:  Blood pressure 126/98, pulse 86, temperature 98.4 F (36.9 C), temperature source Oral, resp. rate 17, weight 204 lb 3.2 oz (92.625 kg), SpO2 98 %.    HEENT: No thrush or ulcers. Resp: Lungs clear bilaterally. Cardio: Regular rate and rhythm. GI: Abdomen soft and nontender. Vascular: No leg edema. Neuro: Alert and oriented. Follows commands. Motor strength 5 over 5. Finger to nose intact.  Skin: Wound dressing right lower outer leg not removed. Port-A-Cath without erythema.    Lab Results:  Lab Results  Component Value Date   WBC 1.7* 07/14/2015   HGB 12.0* 07/14/2015   HCT 36.1* 07/14/2015   MCV 104.3* 07/14/2015   PLT 70* 07/14/2015   NEUTROABS 1.6 07/14/2015    Imaging:  No results found.  Medications: I have reviewed the patient's current medications.  Assessment/Plan: 1. Leukocytosis, severe thrombocytopenia, hepatosplenomegaly November 2016   Diagnosed with AML  Treated at [facility] with induction therapy with cytarabine and daunorubicin (7+3) beginning 02/21/2015   03/05/2015 nadir bone marrow biopsy less than 10% cellularity with pan hypoplasia   04/03/2015 recovery bone marrow biopsy concerning for residual disease due to presence of atypical monocytosis, worrisome for residual AML with normal cytogenetics   04/24/2015 repeat bone marrow biopsy hypercellular (80%) with atypical monocytosis and increased blasts (7 %)   05/29/2015 CSF cytology confirmed CNS disease with first dose  intrathecal methotrexate 06/09/2015, continued twice weekly; therapy switched to intrathecal ARAC secondary to elevated LFTs and myelosuppression; clearance noted 06/19/2015   06/09/2015 repeat bone marrow biopsy consistent with remission with normal cytogenetics;   07/07/2015 consolidation chemotherapy with cycle 1 HiDAC. 2. History of elevated liver enzymes, pancytopenia, fever/weight loss in the remote past with no specific diagnosis made 3. Bone marrow biopsy 11/27/2009 at [facility] and on 12/19/2009 at [facility] confirmed a hypercellular marrow with mild reticulin fibrosis and no evidence of malignancy 4. Liver biopsy 02/10/2010 with minimal portal and lobular inflammation with evidence of steatosis 5. Excisional biopsy of a right inguinal lymph node 01/19/2010 at [facility] with no evidence of malignancy 6. Hypothyroidism 7. Erectile dysfunction 8. Chronic skin hyperpigmentation of the extremities 9. 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 History of a T11 compression fracture, status post a kyphoplasty procedure 07/27/2011 13. Polymyalgia rheumatica   Disposition: Nathan Kemp is status post cycle 1 consolidation chemotherapy with HiDAC. He is currently day 8. We are administering Neulasta today. We reviewed potential toxicities. He is agreeable to proceed.  We will check labs twice weekly and provide transfusion support as needed.  He and his wife report the plan is for him to be admitted to [facility] 08/25/2015 to proceed with cycle 2 HiDAC. We scheduled a  follow-up visit here on 08/31/2015. He understands he can  contact the office  between visits with any problems or concerns.  Patient seen with Dr. Benay Spice. 25 minutes were spent face-to-face at today's visit with the majority of that time involved in counseling/coordination of care.    Ned Card ANP/GNP-BC   07/14/2015  10:30 AM   this was a shared visit with Ned Card. 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 We reviewed the treatment course to date. He will receive Neulasta today after completing recent high-dose cytarabine intensification. We will provide transfusion support as needed.    we will see him after the next cycle of HIDAC and sooner as needed.  Julieanne Manson, M.D.

## 2015-07-14 NOTE — Progress Notes (Signed)
Neulasta injection given by desk nurse. 

## 2015-07-14 NOTE — Telephone Encounter (Signed)
per Lavella Lemons to add pt on Lisa sch for today-added appt-pt here

## 2015-07-14 NOTE — Patient Instructions (Signed)
Pegfilgrastim injection What is this medicine? PEGFILGRASTIM (PEG fil gra stim) is a long-acting granulocyte colony-stimulating factor that stimulates the growth of neutrophils, a type of white blood cell important in the body's fight against infection. It is used to reduce the incidence of fever and infection in patients with certain types of cancer who are receiving chemotherapy that affects the bone marrow, and to increase survival after being exposed to high doses of radiation. This medicine may be used for other purposes; ask your health care provider or pharmacist if you have questions. What should I tell my health care provider before I take this medicine? They need to know if you have any of these conditions: -kidney disease -latex allergy -ongoing radiation therapy -sickle cell disease -skin reactions to acrylic adhesives (On-Body Injector only) -an unusual or allergic reaction to pegfilgrastim, filgrastim, other medicines, foods, dyes, or preservatives -pregnant or trying to get pregnant -breast-feeding How should I use this medicine? This medicine is for injection under the skin. If you get this medicine at home, you will be taught how to prepare and give the pre-filled syringe or how to use the On-body Injector. Refer to the patient Instructions for Use for detailed instructions. Use exactly as directed. Take your medicine at regular intervals. Do not take your medicine more often than directed. It is important that you put your used needles and syringes in a special sharps container. Do not put them in a trash can. If you do not have a sharps container, call your pharmacist or healthcare provider to get one. Talk to your pediatrician regarding the use of this medicine in children. While this drug may be prescribed for selected conditions, precautions do apply. Overdosage: If you think you have taken too much of this medicine contact a poison control center or emergency room at  once. NOTE: This medicine is only for you. Do not share this medicine with others. What if I miss a dose? It is important not to miss your dose. Call your doctor or health care professional if you miss your dose. If you miss a dose due to an On-body Injector failure or leakage, a new dose should be administered as soon as possible using a single prefilled syringe for manual use. What may interact with this medicine? Interactions have not been studied. Give your health care provider a list of all the medicines, herbs, non-prescription drugs, or dietary supplements you use. Also tell them if you smoke, drink alcohol, or use illegal drugs. Some items may interact with your medicine. This list may not describe all possible interactions. Give your health care provider a list of all the medicines, herbs, non-prescription drugs, or dietary supplements you use. Also tell them if you smoke, drink alcohol, or use illegal drugs. Some items may interact with your medicine. What should I watch for while using this medicine? You may need blood work done while you are taking this medicine. If you are going to need a MRI, CT scan, or other procedure, tell your doctor that you are using this medicine (On-Body Injector only). What side effects may I notice from receiving this medicine? Side effects that you should report to your doctor or health care professional as soon as possible: -allergic reactions like skin rash, itching or hives, swelling of the face, lips, or tongue -dizziness -fever -pain, redness, or irritation at site where injected -pinpoint red spots on the skin -red or dark-brown urine -shortness of breath or breathing problems -stomach or side pain, or pain   at the shoulder -swelling -tiredness -trouble passing urine or change in the amount of urine Side effects that usually do not require medical attention (report to your doctor or health care professional if they continue or are  bothersome): -bone pain -muscle pain This list may not describe all possible side effects. Call your doctor for medical advice about side effects. You may report side effects to FDA at 1-800-FDA-1088. Where should I keep my medicine? Keep out of the reach of children. Store pre-filled syringes in a refrigerator between 2 and 8 degrees C (36 and 46 degrees F). Do not freeze. Keep in carton to protect from light. Throw away this medicine if it is left out of the refrigerator for more than 48 hours. Throw away any unused medicine after the expiration date. NOTE: This sheet is a summary. It may not cover all possible information. If you have questions about this medicine, talk to your doctor, pharmacist, or health care provider.    2016, Elsevier/Gold Standard. (2014-04-24 14:30:14)  

## 2015-07-17 ENCOUNTER — Telehealth: Payer: Self-pay | Admitting: Oncology

## 2015-07-17 ENCOUNTER — Other Ambulatory Visit (HOSPITAL_BASED_OUTPATIENT_CLINIC_OR_DEPARTMENT_OTHER): Payer: Medicare Other

## 2015-07-17 ENCOUNTER — Ambulatory Visit: Payer: Medicare Other

## 2015-07-17 ENCOUNTER — Other Ambulatory Visit: Payer: Self-pay | Admitting: *Deleted

## 2015-07-17 ENCOUNTER — Ambulatory Visit (HOSPITAL_BASED_OUTPATIENT_CLINIC_OR_DEPARTMENT_OTHER): Payer: Medicare Other

## 2015-07-17 VITALS — BP 105/57 | HR 74 | Temp 98.2°F | Resp 18

## 2015-07-17 DIAGNOSIS — C9200 Acute myeloblastic leukemia, not having achieved remission: Secondary | ICD-10-CM

## 2015-07-17 DIAGNOSIS — D696 Thrombocytopenia, unspecified: Secondary | ICD-10-CM | POA: Insufficient documentation

## 2015-07-17 DIAGNOSIS — C92Z1 Other myeloid leukemia, in remission: Secondary | ICD-10-CM | POA: Diagnosis not present

## 2015-07-17 DIAGNOSIS — C9201 Acute myeloblastic leukemia, in remission: Secondary | ICD-10-CM

## 2015-07-17 LAB — CBC WITH DIFFERENTIAL/PLATELET
BASO%: 0 % (ref 0.0–2.0)
BASOS ABS: 0 10*3/uL (ref 0.0–0.1)
EOS ABS: 0 10*3/uL (ref 0.0–0.5)
EOS%: 0.3 % (ref 0.0–7.0)
HEMATOCRIT: 32.3 % — AB (ref 38.4–49.9)
HEMOGLOBIN: 10.8 g/dL — AB (ref 13.0–17.1)
LYMPH#: 0.1 10*3/uL — AB (ref 0.9–3.3)
LYMPH%: 3.9 % — ABNORMAL LOW (ref 14.0–49.0)
MCH: 34.8 pg — AB (ref 27.2–33.4)
MCHC: 33.4 g/dL (ref 32.0–36.0)
MCV: 104.2 fL — ABNORMAL HIGH (ref 79.3–98.0)
MONO#: 0.1 10*3/uL (ref 0.1–0.9)
MONO%: 1.8 % (ref 0.0–14.0)
NEUT#: 3.1 10*3/uL (ref 1.5–6.5)
NEUT%: 94 % — AB (ref 39.0–75.0)
Platelets: 7 10*3/uL — CL (ref 140–400)
RBC: 3.1 10*6/uL — AB (ref 4.20–5.82)
RDW: 15.6 % — ABNORMAL HIGH (ref 11.0–14.6)
WBC: 3.3 10*3/uL — ABNORMAL LOW (ref 4.0–10.3)
nRBC: 0 % (ref 0–0)

## 2015-07-17 LAB — PLATELET COUNT: PLATELETS: 8 10*3/uL — AB (ref 145–400)

## 2015-07-17 MED ORDER — HEPARIN SOD (PORK) LOCK FLUSH 100 UNIT/ML IV SOLN
500.0000 [IU] | Freq: Every day | INTRAVENOUS | Status: AC | PRN
  Administered 2015-07-17: 500 [IU]
  Filled 2015-07-17: qty 5

## 2015-07-17 MED ORDER — SODIUM CHLORIDE 0.9 % IV SOLN
250.0000 mL | Freq: Once | INTRAVENOUS | Status: AC
  Administered 2015-07-17: 250 mL via INTRAVENOUS

## 2015-07-17 MED ORDER — SODIUM CHLORIDE 0.9% FLUSH
10.0000 mL | INTRAVENOUS | Status: AC | PRN
  Administered 2015-07-17: 10 mL
  Filled 2015-07-17: qty 10

## 2015-07-17 NOTE — Telephone Encounter (Signed)
Added lab appt for 4/3. Pt aware

## 2015-07-17 NOTE — Patient Instructions (Signed)
  Thrombocytopenia Thrombocytopenia means there are not enough platelets in your blood. Platelets are tiny cells in your blood. When you start bleeding, platelets clump together around the cut or injury to stop the bleeding. This process is called blood clotting. Not having enough platelets can cause bleeding problems. HOME CARE  Check your skin and inside your mouth for bruises or blood as told by your doctor.  Check your spit (sputum), pee (urine), and poop (stool) for blood as told by your doctor.  Do not do activities that can cause bumps or bruises until your doctor says it is okay.  Be careful not to cut yourself when you shave or use scissors, needles, knives, or other tools.  Be careful not to burn yourself when you iron or cook.  Ask your doctor if you can drink alcohol.  Only take medicines as told by your doctor.  Tell all your doctors and your dentist that you have this bleeding problem. GET HELP RIGHT AWAY IF:  You are bleeding anywhere on your body.  You are bleeding or have bruises without knowing why.  You have blood in your spit, pee, or poop. MAKE SURE YOU:  Understand these instructions.  Will watch your condition.  Will get help right away if you are not doing well or get worse.   This information is not intended to replace advice given to you by your health care provider. Make sure you discuss any questions you have with your health care provider.   Document Released: 03/24/2011 Document Revised: 06/27/2011 Document Reviewed: 10/06/2014 Elsevier Interactive Patient Education 2016 [employer].  Report to the emergency room if you have [place] before your next appointment on 07/20/15.

## 2015-07-17 NOTE — Progress Notes (Signed)
Dr. Benay Spice notified of post platelet count 8k. MD discussed case with Dr. Florene Glen at [facility] to discharge home. Pt to return 4/3 for CBC and possible transfusion. Report to ED for any bleeding over the weekend. Teach back complete with pt and sister.  Pt returned to infusion room after discharge. Mild bleeding from port site. Gauze dressing and pressure applied to site. Observed site for 5 minutes. No bleeding noted, pt discharged home.

## 2015-07-17 NOTE — Progress Notes (Signed)
Pt.'s platelet count 7 today.  Spoke with pt in lobby and he is having no bleeding at this time.  Pt to receive 1 unit of platelets today at 2:00PM and have post platelet count done 30 minutes after transfusion per Dr. Benay Spice.  Pt and pt.'s wife verbalize an understanding of above information and will be back for platelet transfusion this afternoon.

## 2015-07-20 ENCOUNTER — Other Ambulatory Visit: Payer: Self-pay | Admitting: *Deleted

## 2015-07-20 ENCOUNTER — Other Ambulatory Visit (HOSPITAL_BASED_OUTPATIENT_CLINIC_OR_DEPARTMENT_OTHER): Payer: Medicare Other

## 2015-07-20 ENCOUNTER — Ambulatory Visit (HOSPITAL_BASED_OUTPATIENT_CLINIC_OR_DEPARTMENT_OTHER): Payer: Medicare Other | Admitting: Oncology

## 2015-07-20 ENCOUNTER — Telehealth: Payer: Self-pay | Admitting: *Deleted

## 2015-07-20 ENCOUNTER — Ambulatory Visit (HOSPITAL_BASED_OUTPATIENT_CLINIC_OR_DEPARTMENT_OTHER): Payer: Medicare Other

## 2015-07-20 ENCOUNTER — Ambulatory Visit: Payer: Medicare Other

## 2015-07-20 ENCOUNTER — Ambulatory Visit (HOSPITAL_COMMUNITY)
Admission: RE | Admit: 2015-07-20 | Discharge: 2015-07-20 | Disposition: A | Discharge status: Home / Self Care | Payer: Medicare Other | Source: Ambulatory Visit | Attending: Oncology | Admitting: Oncology

## 2015-07-20 VITALS — BP 117/69 | HR 83 | Temp 99.2°F | Resp 16

## 2015-07-20 DIAGNOSIS — C9201 Acute myeloblastic leukemia, in remission: Secondary | ICD-10-CM

## 2015-07-20 DIAGNOSIS — D696 Thrombocytopenia, unspecified: Secondary | ICD-10-CM | POA: Diagnosis not present

## 2015-07-20 DIAGNOSIS — D701 Agranulocytosis secondary to cancer chemotherapy: Secondary | ICD-10-CM | POA: Diagnosis not present

## 2015-07-20 DIAGNOSIS — D6959 Other secondary thrombocytopenia: Secondary | ICD-10-CM

## 2015-07-20 DIAGNOSIS — C92Z1 Other myeloid leukemia, in remission: Secondary | ICD-10-CM | POA: Diagnosis not present

## 2015-07-20 DIAGNOSIS — C9200 Acute myeloblastic leukemia, not having achieved remission: Secondary | ICD-10-CM

## 2015-07-20 LAB — CBC WITH DIFFERENTIAL/PLATELET
HEMATOCRIT: 28.4 % — AB (ref 38.4–49.9)
HEMOGLOBIN: 9.5 g/dL — AB (ref 13.0–17.1)
MCH: 34.5 pg — ABNORMAL HIGH (ref 27.2–33.4)
MCHC: 33.5 g/dL (ref 32.0–36.0)
MCV: 103.3 fL — AB (ref 79.3–98.0)
PLATELETS: 3 10*3/uL — AB (ref 140–400)
RBC: 2.75 10*6/uL — ABNORMAL LOW (ref 4.20–5.82)
RDW: 15.4 % — ABNORMAL HIGH (ref 11.0–14.6)

## 2015-07-20 LAB — COMPREHENSIVE METABOLIC PANEL
ALT: 39 U/L (ref 0–55)
ANION GAP: 7 meq/L (ref 3–11)
AST: 19 U/L (ref 5–34)
Albumin: 3.6 g/dL (ref 3.5–5.0)
Alkaline Phosphatase: 242 U/L — ABNORMAL HIGH (ref 40–150)
BILIRUBIN TOTAL: 1.48 mg/dL — AB (ref 0.20–1.20)
BUN: 22.2 mg/dL (ref 7.0–26.0)
CO2: 27 mEq/L (ref 22–29)
CREATININE: 0.9 mg/dL (ref 0.7–1.3)
Calcium: 9.5 mg/dL (ref 8.4–10.4)
Chloride: 104 mEq/L (ref 98–109)
EGFR: 85 mL/min/{1.73_m2} — ABNORMAL LOW (ref >90)
Glucose: 176 mg/dl — ABNORMAL HIGH (ref 70–140)
Potassium: 5.1 mEq/L (ref 3.5–5.1)
Sodium: 138 mEq/L (ref 136–145)
TOTAL PROTEIN: 6.8 g/dL (ref 6.4–8.3)

## 2015-07-20 LAB — PREPARE PLATELET PHERESIS: UNIT DIVISION: 0

## 2015-07-20 LAB — MAGNESIUM: MAGNESIUM: 2.1 mg/dL (ref 1.5–2.5)

## 2015-07-20 LAB — PLATELET COUNT
PLATELETS: 8 10*3/uL — AB (ref 145–400)
Platelets: 10 10*3/uL — CL (ref 150–400)

## 2015-07-20 LAB — TECHNOLOGIST REVIEW

## 2015-07-20 MED ORDER — SODIUM CHLORIDE 0.9% FLUSH
10.0000 mL | INTRAVENOUS | Status: AC | PRN
  Administered 2015-07-20: 10 mL
  Filled 2015-07-20: qty 10

## 2015-07-20 MED ORDER — HEPARIN SOD (PORK) LOCK FLUSH 100 UNIT/ML IV SOLN
500.0000 [IU] | Freq: Every day | INTRAVENOUS | Status: AC | PRN
  Administered 2015-07-20: 500 [IU]
  Filled 2015-07-20: qty 5

## 2015-07-20 MED ORDER — SODIUM CHLORIDE 0.9 % IV SOLN
250.0000 mL | Freq: Once | INTRAVENOUS | Status: AC
  Administered 2015-07-20: 250 mL via INTRAVENOUS

## 2015-07-20 NOTE — Telephone Encounter (Signed)
Late entry for 0930: Critical labs reviewed with Dr. Benay Spice: Order received for platelet transfusion. Pt worked in to infusion room. Spoke with pt in lobby, petechial rash noted. He reports bleeding from gums, several bruises noted on and around pt's mouth. Dr. Benay Spice notified, to see pt in infusion room.

## 2015-07-20 NOTE — Progress Notes (Signed)
Nathan Kemp OFFICE PROGRESS NOTE   Diagnosis:  AML  INTERVAL HISTORY:    Nathan Kemp is seen prior to a scheduled visit. He received Neulasta 07/14/2015. He return for a CBC 07/17/2015 and the platelets were severely decreased. He received a platelet transfusion , but did not have an adequate increment. He denies fever. He reports diffuse bruising and mild bleeding from the mouth and a left toe abrasion. Good appetite.  Objective:  Vital signs in last 24 hours:  There were no vitals taken for this visit.    HEENT:  No thrush or ulcers. Multiple ecchymoses over the tongue, lips, and buccal mucosa Resp:  Lungs clear bilaterally Cardio:  Regular rate and rhythm GI:  No hepatosplenomegaly Vascular:  No leg edema  Skin: scattered ecchymoses over the trunk and extremities. Diffuse petechiae over the legs   Portacath/PICC-without erythema  Lab Results:  Lab Results  Component Value Date   WBC < 0.2* 07/20/2015   HGB 9.5* 07/20/2015   HCT 28.4* 07/20/2015   MCV 103.3* 07/20/2015   PLT 3* 07/20/2015   NEUTROABS 3.1 07/17/2015     Medications: I have reviewed the patient's current medications.  Assessment/Plan: 1. Leukocytosis, severe thrombocytopenia, hepatosplenomegaly November 2016   Diagnosed with AML  Treated at [facility] with induction therapy with cytarabine and daunorubicin (7+3) beginning 02/21/2015   03/05/2015 nadir bone marrow biopsy less than 10% cellularity with pan hypoplasia   04/03/2015 recovery bone marrow biopsy concerning for residual disease due to presence of atypical monocytosis, worrisome for residual AML with normal cytogenetics   04/24/2015 repeat bone marrow biopsy hypercellular (80%) with atypical monocytosis and increased blasts (7 %)   05/29/2015 CSF cytology confirmed CNS disease with first dose intrathecal methotrexate 06/09/2015, continued twice weekly; therapy switched to intrathecal ARAC secondary to elevated LFTs and  myelosuppression; clearance noted 06/19/2015   06/09/2015 repeat bone marrow biopsy consistent with remission with normal cytogenetics;   07/07/2015 consolidation chemotherapy with cycle 1 HiDAC. 2. History of elevated liver enzymes, pancytopenia, fever/weight loss in the remote past with no specific diagnosis made 3. Bone marrow biopsy 11/27/2009 at [facility] and on 12/19/2009 at [facility] confirmed a hypercellular marrow with mild reticulin fibrosis and no evidence of malignancy 4. Liver biopsy 02/10/2010 with minimal portal and lobular inflammation with evidence of steatosis 5. Excisional biopsy of a right inguinal lymph node 01/19/2010 at [facility] with no evidence of malignancy 6. Hypothyroidism 7. Erectile dysfunction 8. Chronic skin hyperpigmentation of the extremities 9. 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 History of a T11 compression fracture, status post a kyphoplasty procedure 07/27/2011 13. Polymyalgia rheumatica 14.  Severe neutropenia and thrombocytopenia following high-dose  Cytarabine intensification.  Disposition:   Nathan Kemp is now at day 14 following high-dose cytarabine intensification. He has severe neutropenia and thrombocytopenia. He is on prophylactic antibiotics. He has significant skin bleeding. He will receive a platelet transfusion today. I will contact the leukemia service at [facility] if he does not have a significant bump in the platelet count today.    we will arrange for outpatient follow-up based on the platelet count later today.  Nathan Coder, MD  07/20/2015  11:17  AM

## 2015-07-20 NOTE — Telephone Encounter (Addendum)
Post transfusion platelet count 8k. Dr. Benay Spice discussed case with MD at [facility]: Plan is to administer another unit of platelets today, repeat a post platelet count. [facility] will arrange for HLA typed platelets going forward. Tim, Infusion RN notified of plan to administer another unit of platelets, draw platelet count 30 min post infusion.

## 2015-07-20 NOTE — Addendum Note (Signed)
Addended by: Brien Few on: 07/20/2015 09:56 AM   Modules accepted: Orders

## 2015-07-20 NOTE — Patient Instructions (Signed)
Platelet Transfusion  A platelet transfusion is a procedure in which you receive donated platelets through an IV tube. Platelets are tiny pieces of blood cells. When a blood vessel is damaged, platelets collect in the damaged area to help form a blood clot. This begins the healing process. If your platelet count gets too low, your blood may have trouble clotting.  You may need a platelet transfusion if you have a condition that causes a low number of platelets (thrombocytopenia). A platelet transfusion may be used to stop or prevent bleeding.  LET YOUR HEALTH CARE PROVIDER KNOW ABOUT:   Any allergies you have.   All medicines you are taking, including vitamins, herbs, eye drops, creams, and over-the-counter medicines.   Previous problems you or members of your family have had with the use of anesthetics.   Any blood disorders you have.   Previous surgeries you have had.   Any medical conditions you may have.   Any reactions you have had during a previous transfusion. RISKS AND COMPLICATIONS Generally, this is a safe procedure. However, problems may occur, including:   Fever with or without chills. The fever usually occurs within the first 4 hours of the transfusion and returns to normal within 48 hours.  Allergic reaction. The reaction is most commonly caused by antibodies your body creates against substances in the transfusion. Signs of an allergic reaction may include itching, hives, difficulty breathing, shock, or low blood pressure.  Sudden (acute) or delayed hemolytic reaction. This rare reaction can occur during the transfusion and up to 28 days after the transfusion. The reaction usually occurs when your body's defense system (immune system) attacks the new platelets. Signs of a hemolytic reaction may include fever, headache, difficulty breathing, low blood pressure, a rapid heartbeat, or pain in your back, abdomen, chest, or IV site.  Transfusion-related acute lung injury  (TRALI). TRALI can occur within hours of a transfusion, or several days later. This is a rare reaction that causes lung damage. The cause is not known.  Infection. Signs of this rare complication may include fever, chills, vomiting, a rapid heartbeat, or low blood pressure. BEFORE THE PROCEDURE   You may have a blood test to determine your blood type. This is necessary to find out what kind ofplatelets best matches your platelets.  If you have had an allergic reaction to a transfusion in the past, you may be given medicine to help prevent a reaction. Take this medicine only as directed by your health care provider.  Your temperature, blood pressure, and pulse will be monitored before the transfusion. PROCEDURE  An IV will be started in your hand or arm.  The transfusion will be attached to your IV tubing. The bag of donated platelets will be attached to your IV tube andgiven into your vein.  Your temperature, blood pressure, and pulse will be monitored regularly during the transfusion. This monitoring is done to help detect early signs of a transfusion reaction.  If you have any signs or symptoms of a reaction, your transfusion will be stopped and you may be given medicine.  When your transfusion is complete, your IV will be removed.  Pressure may be applied to the IV site for a few minutes.  A bandage (dressing) will be applied. The procedure may vary among health care providers and hospitals. AFTER THE PROCEDURE  Your blood pressure, temperature, and pulse will be monitored regularly.   This information is not intended to replace advice given to you by your health   care provider. Make sure you discuss any questions you have with your health care provider.   Document Released: 01/30/2007 Document Revised: 04/25/2014 Document Reviewed: 02/12/2014 Elsevier Interactive Patient Education 2016 [employer]  

## 2015-07-20 NOTE — Progress Notes (Signed)
Dr. Benay Spice came to see patient after 2nd unit of platelets. Advised patient to follow-up with MD's at [facility] tomorrow morning as planned. Dr. Benay Spice advised to proceed with heparinizing PAC post treatment. Patient discharged in stable condition.

## 2015-07-21 ENCOUNTER — Other Ambulatory Visit: Payer: Medicare Other

## 2015-07-21 DIAGNOSIS — D899 Disorder involving the immune mechanism, unspecified: Secondary | ICD-10-CM | POA: Diagnosis not present

## 2015-07-21 DIAGNOSIS — D696 Thrombocytopenia, unspecified: Secondary | ICD-10-CM | POA: Diagnosis not present

## 2015-07-21 DIAGNOSIS — R251 Tremor, unspecified: Secondary | ICD-10-CM | POA: Diagnosis not present

## 2015-07-21 DIAGNOSIS — R5081 Fever presenting with conditions classified elsewhere: Secondary | ICD-10-CM | POA: Diagnosis not present

## 2015-07-21 DIAGNOSIS — D649 Anemia, unspecified: Secondary | ICD-10-CM | POA: Diagnosis not present

## 2015-07-21 DIAGNOSIS — I10 Essential (primary) hypertension: Secondary | ICD-10-CM | POA: Diagnosis not present

## 2015-07-21 DIAGNOSIS — E119 Type 2 diabetes mellitus without complications: Secondary | ICD-10-CM | POA: Diagnosis not present

## 2015-07-21 DIAGNOSIS — K581 Irritable bowel syndrome with constipation: Secondary | ICD-10-CM | POA: Diagnosis not present

## 2015-07-21 DIAGNOSIS — C9201 Acute myeloblastic leukemia, in remission: Secondary | ICD-10-CM | POA: Diagnosis not present

## 2015-07-21 DIAGNOSIS — D709 Neutropenia, unspecified: Secondary | ICD-10-CM | POA: Diagnosis not present

## 2015-07-21 DIAGNOSIS — R918 Other nonspecific abnormal finding of lung field: Secondary | ICD-10-CM | POA: Diagnosis not present

## 2015-07-21 DIAGNOSIS — E039 Hypothyroidism, unspecified: Secondary | ICD-10-CM | POA: Diagnosis not present

## 2015-07-21 DIAGNOSIS — R600 Localized edema: Secondary | ICD-10-CM | POA: Diagnosis not present

## 2015-07-21 DIAGNOSIS — C9200 Acute myeloblastic leukemia, not having achieved remission: Secondary | ICD-10-CM | POA: Diagnosis not present

## 2015-07-21 LAB — PREPARE PLATELET PHERESIS
UNIT DIVISION: 0
UNIT DIVISION: 0

## 2015-07-22 ENCOUNTER — Other Ambulatory Visit: Payer: Medicare Other

## 2015-07-22 ENCOUNTER — Ambulatory Visit: Payer: Medicare Other | Admitting: Nurse Practitioner

## 2015-07-22 ENCOUNTER — Telehealth: Payer: Self-pay | Admitting: Oncology

## 2015-07-22 DIAGNOSIS — Z79890 Hormone replacement therapy (postmenopausal): Secondary | ICD-10-CM | POA: Diagnosis not present

## 2015-07-22 DIAGNOSIS — I2510 Atherosclerotic heart disease of native coronary artery without angina pectoris: Secondary | ICD-10-CM | POA: Diagnosis present

## 2015-07-22 DIAGNOSIS — C9200 Acute myeloblastic leukemia, not having achieved remission: Secondary | ICD-10-CM | POA: Diagnosis present

## 2015-07-22 DIAGNOSIS — I10 Essential (primary) hypertension: Secondary | ICD-10-CM | POA: Diagnosis not present

## 2015-07-22 DIAGNOSIS — E119 Type 2 diabetes mellitus without complications: Secondary | ICD-10-CM | POA: Diagnosis not present

## 2015-07-22 DIAGNOSIS — R5081 Fever presenting with conditions classified elsewhere: Secondary | ICD-10-CM | POA: Diagnosis not present

## 2015-07-22 DIAGNOSIS — Z7952 Long term (current) use of systemic steroids: Secondary | ICD-10-CM | POA: Diagnosis not present

## 2015-07-22 DIAGNOSIS — E039 Hypothyroidism, unspecified: Secondary | ICD-10-CM | POA: Diagnosis not present

## 2015-07-22 DIAGNOSIS — D61810 Antineoplastic chemotherapy induced pancytopenia: Secondary | ICD-10-CM | POA: Diagnosis not present

## 2015-07-22 DIAGNOSIS — D709 Neutropenia, unspecified: Secondary | ICD-10-CM | POA: Diagnosis not present

## 2015-07-22 DIAGNOSIS — Z79899 Other long term (current) drug therapy: Secondary | ICD-10-CM | POA: Diagnosis not present

## 2015-07-22 DIAGNOSIS — Z794 Long term (current) use of insulin: Secondary | ICD-10-CM | POA: Diagnosis not present

## 2015-07-22 DIAGNOSIS — R918 Other nonspecific abnormal finding of lung field: Secondary | ICD-10-CM | POA: Diagnosis not present

## 2015-07-22 DIAGNOSIS — Z91048 Other nonmedicinal substance allergy status: Secondary | ICD-10-CM | POA: Diagnosis not present

## 2015-07-22 DIAGNOSIS — D61818 Other pancytopenia: Secondary | ICD-10-CM | POA: Diagnosis present

## 2015-07-22 DIAGNOSIS — K589 Irritable bowel syndrome without diarrhea: Secondary | ICD-10-CM | POA: Diagnosis present

## 2015-07-22 DIAGNOSIS — M353 Polymyalgia rheumatica: Secondary | ICD-10-CM | POA: Diagnosis not present

## 2015-07-22 DIAGNOSIS — Z881 Allergy status to other antibiotic agents status: Secondary | ICD-10-CM | POA: Diagnosis not present

## 2015-07-22 DIAGNOSIS — Z888 Allergy status to other drugs, medicaments and biological substances status: Secondary | ICD-10-CM | POA: Diagnosis not present

## 2015-07-22 DIAGNOSIS — D899 Disorder involving the immune mechanism, unspecified: Secondary | ICD-10-CM | POA: Diagnosis present

## 2015-07-22 DIAGNOSIS — Z885 Allergy status to narcotic agent status: Secondary | ICD-10-CM | POA: Diagnosis not present

## 2015-07-22 NOTE — Telephone Encounter (Signed)
s.w. pt and cx appts due to pt in hospital at [facility]. pt only wanted to keep MD visit in April

## 2015-07-24 ENCOUNTER — Other Ambulatory Visit: Payer: Medicare Other

## 2015-07-27 DIAGNOSIS — C9201 Acute myeloblastic leukemia, in remission: Secondary | ICD-10-CM | POA: Diagnosis not present

## 2015-07-28 ENCOUNTER — Other Ambulatory Visit: Payer: Medicare Other

## 2015-07-31 ENCOUNTER — Other Ambulatory Visit: Payer: Medicare Other

## 2015-07-31 DIAGNOSIS — Z7952 Long term (current) use of systemic steroids: Secondary | ICD-10-CM | POA: Diagnosis not present

## 2015-07-31 DIAGNOSIS — Z794 Long term (current) use of insulin: Secondary | ICD-10-CM | POA: Diagnosis not present

## 2015-07-31 DIAGNOSIS — E119 Type 2 diabetes mellitus without complications: Secondary | ICD-10-CM | POA: Diagnosis not present

## 2015-07-31 DIAGNOSIS — I10 Essential (primary) hypertension: Secondary | ICD-10-CM | POA: Diagnosis not present

## 2015-07-31 DIAGNOSIS — K5900 Constipation, unspecified: Secondary | ICD-10-CM | POA: Diagnosis not present

## 2015-07-31 DIAGNOSIS — G4700 Insomnia, unspecified: Secondary | ICD-10-CM | POA: Diagnosis not present

## 2015-07-31 DIAGNOSIS — E039 Hypothyroidism, unspecified: Secondary | ICD-10-CM | POA: Diagnosis not present

## 2015-07-31 DIAGNOSIS — Z79899 Other long term (current) drug therapy: Secondary | ICD-10-CM | POA: Diagnosis not present

## 2015-07-31 DIAGNOSIS — C9201 Acute myeloblastic leukemia, in remission: Secondary | ICD-10-CM | POA: Diagnosis not present

## 2015-07-31 DIAGNOSIS — R251 Tremor, unspecified: Secondary | ICD-10-CM | POA: Diagnosis not present

## 2015-07-31 DIAGNOSIS — I517 Cardiomegaly: Secondary | ICD-10-CM | POA: Diagnosis not present

## 2015-07-31 DIAGNOSIS — Z8719 Personal history of other diseases of the digestive system: Secondary | ICD-10-CM | POA: Diagnosis not present

## 2015-07-31 DIAGNOSIS — Z9889 Other specified postprocedural states: Secondary | ICD-10-CM | POA: Diagnosis not present

## 2015-07-31 DIAGNOSIS — I2510 Atherosclerotic heart disease of native coronary artery without angina pectoris: Secondary | ICD-10-CM | POA: Diagnosis not present

## 2015-07-31 DIAGNOSIS — E876 Hypokalemia: Secondary | ICD-10-CM | POA: Diagnosis not present

## 2015-07-31 DIAGNOSIS — R4701 Aphasia: Secondary | ICD-10-CM | POA: Diagnosis not present

## 2015-07-31 DIAGNOSIS — M353 Polymyalgia rheumatica: Secondary | ICD-10-CM | POA: Diagnosis not present

## 2015-07-31 DIAGNOSIS — C9200 Acute myeloblastic leukemia, not having achieved remission: Secondary | ICD-10-CM | POA: Diagnosis not present

## 2015-08-04 ENCOUNTER — Other Ambulatory Visit: Payer: Medicare Other

## 2015-08-04 DIAGNOSIS — E119 Type 2 diabetes mellitus without complications: Secondary | ICD-10-CM | POA: Diagnosis not present

## 2015-08-04 DIAGNOSIS — E876 Hypokalemia: Secondary | ICD-10-CM | POA: Diagnosis not present

## 2015-08-04 DIAGNOSIS — Z79899 Other long term (current) drug therapy: Secondary | ICD-10-CM | POA: Diagnosis not present

## 2015-08-04 DIAGNOSIS — E039 Hypothyroidism, unspecified: Secondary | ICD-10-CM | POA: Diagnosis not present

## 2015-08-04 DIAGNOSIS — D696 Thrombocytopenia, unspecified: Secondary | ICD-10-CM | POA: Diagnosis not present

## 2015-08-04 DIAGNOSIS — K5900 Constipation, unspecified: Secondary | ICD-10-CM | POA: Diagnosis not present

## 2015-08-04 DIAGNOSIS — I2510 Atherosclerotic heart disease of native coronary artery without angina pectoris: Secondary | ICD-10-CM | POA: Diagnosis not present

## 2015-08-04 DIAGNOSIS — R600 Localized edema: Secondary | ICD-10-CM | POA: Diagnosis not present

## 2015-08-04 DIAGNOSIS — R251 Tremor, unspecified: Secondary | ICD-10-CM | POA: Diagnosis not present

## 2015-08-04 DIAGNOSIS — G4700 Insomnia, unspecified: Secondary | ICD-10-CM | POA: Diagnosis not present

## 2015-08-04 DIAGNOSIS — Z794 Long term (current) use of insulin: Secondary | ICD-10-CM | POA: Diagnosis not present

## 2015-08-04 DIAGNOSIS — C9201 Acute myeloblastic leukemia, in remission: Secondary | ICD-10-CM | POA: Diagnosis not present

## 2015-08-04 DIAGNOSIS — C9200 Acute myeloblastic leukemia, not having achieved remission: Secondary | ICD-10-CM | POA: Diagnosis not present

## 2015-08-04 DIAGNOSIS — I10 Essential (primary) hypertension: Secondary | ICD-10-CM | POA: Diagnosis not present

## 2015-08-04 DIAGNOSIS — D649 Anemia, unspecified: Secondary | ICD-10-CM | POA: Diagnosis not present

## 2015-08-07 ENCOUNTER — Other Ambulatory Visit: Payer: Medicare Other

## 2015-08-11 ENCOUNTER — Other Ambulatory Visit: Payer: Medicare Other

## 2015-08-14 ENCOUNTER — Other Ambulatory Visit: Payer: Medicare Other

## 2015-08-14 ENCOUNTER — Ambulatory Visit: Payer: Medicare Other | Admitting: Nurse Practitioner

## 2015-08-17 DIAGNOSIS — Z79899 Other long term (current) drug therapy: Secondary | ICD-10-CM | POA: Diagnosis not present

## 2015-08-17 DIAGNOSIS — Z7189 Other specified counseling: Secondary | ICD-10-CM | POA: Diagnosis not present

## 2015-08-17 DIAGNOSIS — E119 Type 2 diabetes mellitus without complications: Secondary | ICD-10-CM | POA: Diagnosis not present

## 2015-08-17 DIAGNOSIS — Z881 Allergy status to other antibiotic agents status: Secondary | ICD-10-CM | POA: Diagnosis not present

## 2015-08-17 DIAGNOSIS — Z794 Long term (current) use of insulin: Secondary | ICD-10-CM | POA: Diagnosis not present

## 2015-08-17 DIAGNOSIS — M353 Polymyalgia rheumatica: Secondary | ICD-10-CM | POA: Diagnosis not present

## 2015-08-17 DIAGNOSIS — C9201 Acute myeloblastic leukemia, in remission: Secondary | ICD-10-CM | POA: Diagnosis not present

## 2015-08-17 DIAGNOSIS — Z955 Presence of coronary angioplasty implant and graft: Secondary | ICD-10-CM | POA: Diagnosis not present

## 2015-08-17 DIAGNOSIS — I2510 Atherosclerotic heart disease of native coronary artery without angina pectoris: Secondary | ICD-10-CM | POA: Diagnosis not present

## 2015-08-17 DIAGNOSIS — Z7952 Long term (current) use of systemic steroids: Secondary | ICD-10-CM | POA: Diagnosis not present

## 2015-08-17 DIAGNOSIS — Z885 Allergy status to narcotic agent status: Secondary | ICD-10-CM | POA: Diagnosis not present

## 2015-08-17 DIAGNOSIS — Z85828 Personal history of other malignant neoplasm of skin: Secondary | ICD-10-CM | POA: Diagnosis not present

## 2015-08-17 DIAGNOSIS — E039 Hypothyroidism, unspecified: Secondary | ICD-10-CM | POA: Diagnosis not present

## 2015-08-17 DIAGNOSIS — Z79891 Long term (current) use of opiate analgesic: Secondary | ICD-10-CM | POA: Diagnosis not present

## 2015-08-17 DIAGNOSIS — C9310 Chronic myelomonocytic leukemia not having achieved remission: Secondary | ICD-10-CM | POA: Diagnosis not present

## 2015-08-17 DIAGNOSIS — Z01818 Encounter for other preprocedural examination: Secondary | ICD-10-CM | POA: Diagnosis not present

## 2015-08-17 DIAGNOSIS — I10 Essential (primary) hypertension: Secondary | ICD-10-CM | POA: Diagnosis not present

## 2015-08-17 DIAGNOSIS — Z888 Allergy status to other drugs, medicaments and biological substances status: Secondary | ICD-10-CM | POA: Diagnosis not present

## 2015-08-25 ENCOUNTER — Other Ambulatory Visit: Payer: Self-pay | Admitting: *Deleted

## 2015-08-25 ENCOUNTER — Encounter: Payer: Self-pay | Admitting: *Deleted

## 2015-08-25 DIAGNOSIS — G4700 Insomnia, unspecified: Secondary | ICD-10-CM | POA: Diagnosis present

## 2015-08-25 DIAGNOSIS — Z888 Allergy status to other drugs, medicaments and biological substances status: Secondary | ICD-10-CM | POA: Diagnosis not present

## 2015-08-25 DIAGNOSIS — Z881 Allergy status to other antibiotic agents status: Secondary | ICD-10-CM | POA: Diagnosis not present

## 2015-08-25 DIAGNOSIS — I2510 Atherosclerotic heart disease of native coronary artery without angina pectoris: Secondary | ICD-10-CM | POA: Diagnosis present

## 2015-08-25 DIAGNOSIS — R251 Tremor, unspecified: Secondary | ICD-10-CM | POA: Diagnosis present

## 2015-08-25 DIAGNOSIS — K219 Gastro-esophageal reflux disease without esophagitis: Secondary | ICD-10-CM | POA: Diagnosis present

## 2015-08-25 DIAGNOSIS — Z79899 Other long term (current) drug therapy: Secondary | ICD-10-CM | POA: Diagnosis not present

## 2015-08-25 DIAGNOSIS — T451X5A Adverse effect of antineoplastic and immunosuppressive drugs, initial encounter: Secondary | ICD-10-CM | POA: Diagnosis not present

## 2015-08-25 DIAGNOSIS — Z955 Presence of coronary angioplasty implant and graft: Secondary | ICD-10-CM | POA: Diagnosis not present

## 2015-08-25 DIAGNOSIS — E876 Hypokalemia: Secondary | ICD-10-CM | POA: Diagnosis present

## 2015-08-25 DIAGNOSIS — I10 Essential (primary) hypertension: Secondary | ICD-10-CM | POA: Diagnosis not present

## 2015-08-25 DIAGNOSIS — D696 Thrombocytopenia, unspecified: Secondary | ICD-10-CM | POA: Diagnosis not present

## 2015-08-25 DIAGNOSIS — C9200 Acute myeloblastic leukemia, not having achieved remission: Secondary | ICD-10-CM | POA: Diagnosis not present

## 2015-08-25 DIAGNOSIS — D61810 Antineoplastic chemotherapy induced pancytopenia: Secondary | ICD-10-CM | POA: Diagnosis not present

## 2015-08-25 DIAGNOSIS — E039 Hypothyroidism, unspecified: Secondary | ICD-10-CM | POA: Diagnosis not present

## 2015-08-25 DIAGNOSIS — R600 Localized edema: Secondary | ICD-10-CM | POA: Diagnosis not present

## 2015-08-25 DIAGNOSIS — E119 Type 2 diabetes mellitus without complications: Secondary | ICD-10-CM | POA: Diagnosis not present

## 2015-08-25 DIAGNOSIS — M353 Polymyalgia rheumatica: Secondary | ICD-10-CM | POA: Diagnosis not present

## 2015-08-25 DIAGNOSIS — D649 Anemia, unspecified: Secondary | ICD-10-CM | POA: Diagnosis not present

## 2015-08-25 DIAGNOSIS — R471 Dysarthria and anarthria: Secondary | ICD-10-CM | POA: Diagnosis present

## 2015-08-25 DIAGNOSIS — Z7952 Long term (current) use of systemic steroids: Secondary | ICD-10-CM | POA: Diagnosis not present

## 2015-08-25 DIAGNOSIS — C9201 Acute myeloblastic leukemia, in remission: Secondary | ICD-10-CM | POA: Diagnosis not present

## 2015-08-25 DIAGNOSIS — Z885 Allergy status to narcotic agent status: Secondary | ICD-10-CM | POA: Diagnosis not present

## 2015-08-25 DIAGNOSIS — C9251 Acute myelomonocytic leukemia, in remission: Secondary | ICD-10-CM | POA: Diagnosis present

## 2015-08-25 DIAGNOSIS — Z5111 Encounter for antineoplastic chemotherapy: Secondary | ICD-10-CM | POA: Diagnosis not present

## 2015-08-25 DIAGNOSIS — K581 Irritable bowel syndrome with constipation: Secondary | ICD-10-CM | POA: Diagnosis not present

## 2015-08-25 DIAGNOSIS — Z884 Allergy status to anesthetic agent status: Secondary | ICD-10-CM | POA: Diagnosis not present

## 2015-08-25 DIAGNOSIS — K589 Irritable bowel syndrome without diarrhea: Secondary | ICD-10-CM | POA: Diagnosis present

## 2015-08-25 DIAGNOSIS — Z794 Long term (current) use of insulin: Secondary | ICD-10-CM | POA: Diagnosis not present

## 2015-08-25 DIAGNOSIS — Z886 Allergy status to analgesic agent status: Secondary | ICD-10-CM | POA: Diagnosis not present

## 2015-08-25 DIAGNOSIS — R51 Headache: Secondary | ICD-10-CM | POA: Diagnosis not present

## 2015-08-25 NOTE — Progress Notes (Signed)
Faxed order received from Dr. Jacinto Reap. Powell for pt to receive Neulasta 6mg  SQ x1 on Monday, 08/31/15.  Order placed and POF sent for injection appointment.  Dr. Benay Spice aware.

## 2015-08-31 ENCOUNTER — Ambulatory Visit (HOSPITAL_BASED_OUTPATIENT_CLINIC_OR_DEPARTMENT_OTHER): Payer: Medicare Other | Admitting: Oncology

## 2015-08-31 ENCOUNTER — Telehealth: Payer: Self-pay | Admitting: Oncology

## 2015-08-31 ENCOUNTER — Other Ambulatory Visit (HOSPITAL_BASED_OUTPATIENT_CLINIC_OR_DEPARTMENT_OTHER): Payer: Medicare Other

## 2015-08-31 ENCOUNTER — Ambulatory Visit (HOSPITAL_BASED_OUTPATIENT_CLINIC_OR_DEPARTMENT_OTHER): Payer: Medicare Other

## 2015-08-31 VITALS — BP 119/73 | HR 98 | Temp 98.6°F | Resp 18 | Ht 69.0 in | Wt 207.8 lb

## 2015-08-31 DIAGNOSIS — C9200 Acute myeloblastic leukemia, not having achieved remission: Secondary | ICD-10-CM

## 2015-08-31 DIAGNOSIS — C9201 Acute myeloblastic leukemia, in remission: Secondary | ICD-10-CM

## 2015-08-31 DIAGNOSIS — Z5189 Encounter for other specified aftercare: Secondary | ICD-10-CM

## 2015-08-31 LAB — COMPREHENSIVE METABOLIC PANEL
ALBUMIN: 3.8 g/dL (ref 3.5–5.0)
ALT: 32 U/L (ref 0–55)
AST: 20 U/L (ref 5–34)
Alkaline Phosphatase: 194 U/L — ABNORMAL HIGH (ref 40–150)
Anion Gap: 9 mEq/L (ref 3–11)
BUN: 22.4 mg/dL (ref 7.0–26.0)
CO2: 27 meq/L (ref 22–29)
Calcium: 9.5 mg/dL (ref 8.4–10.4)
Chloride: 103 mEq/L (ref 98–109)
Creatinine: 0.9 mg/dL (ref 0.7–1.3)
EGFR: 82 mL/min/{1.73_m2} — AB (ref >90)
GLUCOSE: 221 mg/dL — AB (ref 70–140)
POTASSIUM: 4.4 meq/L (ref 3.5–5.1)
SODIUM: 139 meq/L (ref 136–145)
Total Bilirubin: 1.56 mg/dL — ABNORMAL HIGH (ref 0.20–1.20)
Total Protein: 6.9 g/dL (ref 6.4–8.3)

## 2015-08-31 LAB — CBC WITH DIFFERENTIAL/PLATELET
BASO%: 0.7 % (ref 0.0–2.0)
BASOS ABS: 0 10*3/uL (ref 0.0–0.1)
EOS%: 0.7 % (ref 0.0–7.0)
Eosinophils Absolute: 0 10*3/uL (ref 0.0–0.5)
HCT: 31.6 % — ABNORMAL LOW (ref 38.4–49.9)
HEMOGLOBIN: 10.3 g/dL — AB (ref 13.0–17.1)
LYMPH#: 0.1 10*3/uL — AB (ref 0.9–3.3)
LYMPH%: 6.1 % — ABNORMAL LOW (ref 14.0–49.0)
MCH: 34.3 pg — AB (ref 27.2–33.4)
MCHC: 32.6 g/dL (ref 32.0–36.0)
MCV: 105.3 fL — AB (ref 79.3–98.0)
MONO#: 0.1 10*3/uL (ref 0.1–0.9)
MONO%: 6.8 % (ref 0.0–14.0)
NEUT#: 1.3 10*3/uL — ABNORMAL LOW (ref 1.5–6.5)
NEUT%: 85.7 % — AB (ref 39.0–75.0)
NRBC: 0 % (ref 0–0)
Platelets: 67 10*3/uL — ABNORMAL LOW (ref 140–400)
RBC: 3 10*6/uL — AB (ref 4.20–5.82)
RDW: 20.8 % — AB (ref 11.0–14.6)
WBC: 1.5 10*3/uL — ABNORMAL LOW (ref 4.0–10.3)

## 2015-08-31 LAB — MAGNESIUM: MAGNESIUM: 2.2 mg/dL (ref 1.5–2.5)

## 2015-08-31 MED ORDER — PEGFILGRASTIM INJECTION 6 MG/0.6ML
6.0000 mg | Freq: Once | SUBCUTANEOUS | Status: AC
  Administered 2015-08-31: 6 mg via SUBCUTANEOUS
  Filled 2015-08-31: qty 0.6

## 2015-08-31 NOTE — Telephone Encounter (Signed)
per pof to sch pt appt-gave pt copy of avs °

## 2015-08-31 NOTE — Progress Notes (Signed)
[place] OFFICE PROGRESS NOTE   Diagnosis: AML  INTERVAL HISTORY:   Mr. Wilbourne returns as scheduled. He was discharged yesterday after completing a second cycle of cytarabine intensification. He continues to have pain from "polymyalgia ". No bleeding. He has difficulty with speech. He has undergone treatment with intrathecal methotrexate for CNS leukemia. The most recent cytologies have been negative. He has decided against bone marrow transplant.  Objective:  Vital signs in last 24 hours:  Blood pressure 119/73, pulse 98, temperature 98.6 F (37 C), temperature source Oral, resp. rate 18, height _0  (1.753 m), weight 207 lb 12.8 oz (94.257 kg), SpO2 97 %.    HEENT: No thrush, ulcer, or bleeding Resp: Mild inspiratory wheeze at the upper posterior chest bilaterally, no respiratory distress Cardio: Regular rate and rhythm GI: No hepatosplenomegaly, nontender Vascular: Chronic stasis change at the lower leg bilaterally with support stockings in place. No edema Neuro: Alert and oriented, expressive aphasia    Portacath/PICC-without erythema  Lab Results:  Lab Results  Component Value Date   WBC 1.5* 08/31/2015   HGB 10.3* 08/31/2015   HCT 31.6* 08/31/2015   MCV 105.3* 08/31/2015   PLT 67* 08/31/2015   NEUTROABS 1.3* 08/31/2015     Medications: I have reviewed the patient's current medications.  Assessment/Plan: 1. Leukocytosis, severe thrombocytopenia, hepatosplenomegaly November 2016   Diagnosed with AML  Treated at [facility] with induction therapy with cytarabine and daunorubicin (7+3) beginning 02/21/2015   03/05/2015 nadir bone marrow biopsy less than 10% cellularity with pan hypoplasia   04/03/2015 recovery bone marrow biopsy concerning for residual disease due to presence of atypical monocytosis, worrisome for residual AML with normal cytogenetics   04/24/2015 repeat bone marrow biopsy hypercellular (80%) with atypical monocytosis and  increased blasts (7 %)   05/29/2015 CSF cytology confirmed CNS disease with first dose intrathecal methotrexate 06/09/2015, continued twice weekly; therapy switched to intrathecal ARAC secondary to elevated LFTs and myelosuppression; clearance noted 06/19/2015   06/09/2015 repeat bone marrow biopsy consistent with remission with normal cytogenetics;   07/07/2015 consolidation chemotherapy with cycle 1 HiDAC.  Negative CSF cytology 08/05/2015  08/25/2015-cycle 2 consolidation chemotherapy with HIDAC 2. History of elevated liver enzymes, pancytopenia, fever/weight loss in the remote past with no specific diagnosis made 3. Bone marrow biopsy 11/27/2009 at [facility] and on 12/19/2009 at [facility] confirmed a hypercellular marrow with mild reticulin fibrosis and no evidence of malignancy 4. Liver biopsy 02/10/2010 with minimal portal and lobular inflammation with evidence of steatosis 5. Excisional biopsy of a right inguinal lymph node 01/19/2010 at [facility] with no evidence of malignancy 6. Hypothyroidism 7. Erectile dysfunction 8. Chronic skin hyperpigmentation of the extremities 9. 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 History of a T11 compression fracture, status post a kyphoplasty procedure 07/27/2011 13. Polymyalgia rheumatica 14. History of Severe neutropenia and thrombocytopenia following high-dose Cytarabine intensification. 15. Expressive aphasia-etiology unclear-being evaluated by neurology at [facility]   Disposition:  Mr. Levario is now at day 7 following cycle 2 consolidation chemotherapy with  high-dose cytarabine. He appears to be tolerating the chemotherapy well. He will receive Neulasta today. He is scheduled for a follow-up visit at  [place] 09/04/2015. He will have laboratory follow-up and transfusion support as needed at [facility]. I am available to see him as needed.  Nathan Kemp will be scheduled for an office visit following cycle 3 consolidation chemotherapy.  Betsy Coder, MD  08/31/2015  9:59 AM

## 2015-09-04 DIAGNOSIS — G4700 Insomnia, unspecified: Secondary | ICD-10-CM | POA: Diagnosis not present

## 2015-09-04 DIAGNOSIS — Z888 Allergy status to other drugs, medicaments and biological substances status: Secondary | ICD-10-CM | POA: Diagnosis not present

## 2015-09-04 DIAGNOSIS — S81801A Unspecified open wound, right lower leg, initial encounter: Secondary | ICD-10-CM | POA: Diagnosis not present

## 2015-09-04 DIAGNOSIS — Z885 Allergy status to narcotic agent status: Secondary | ICD-10-CM | POA: Diagnosis not present

## 2015-09-04 DIAGNOSIS — Z79899 Other long term (current) drug therapy: Secondary | ICD-10-CM | POA: Diagnosis not present

## 2015-09-04 DIAGNOSIS — D61818 Other pancytopenia: Secondary | ICD-10-CM | POA: Diagnosis not present

## 2015-09-04 DIAGNOSIS — D649 Anemia, unspecified: Secondary | ICD-10-CM | POA: Diagnosis not present

## 2015-09-04 DIAGNOSIS — K5900 Constipation, unspecified: Secondary | ICD-10-CM | POA: Diagnosis not present

## 2015-09-04 DIAGNOSIS — I10 Essential (primary) hypertension: Secondary | ICD-10-CM | POA: Diagnosis not present

## 2015-09-04 DIAGNOSIS — Z881 Allergy status to other antibiotic agents status: Secondary | ICD-10-CM | POA: Diagnosis not present

## 2015-09-04 DIAGNOSIS — C9200 Acute myeloblastic leukemia, not having achieved remission: Secondary | ICD-10-CM | POA: Diagnosis not present

## 2015-09-04 DIAGNOSIS — D696 Thrombocytopenia, unspecified: Secondary | ICD-10-CM | POA: Diagnosis not present

## 2015-09-04 DIAGNOSIS — I2510 Atherosclerotic heart disease of native coronary artery without angina pectoris: Secondary | ICD-10-CM | POA: Diagnosis not present

## 2015-09-04 DIAGNOSIS — E039 Hypothyroidism, unspecified: Secondary | ICD-10-CM | POA: Diagnosis not present

## 2015-09-04 DIAGNOSIS — R7989 Other specified abnormal findings of blood chemistry: Secondary | ICD-10-CM | POA: Diagnosis not present

## 2015-09-04 DIAGNOSIS — E876 Hypokalemia: Secondary | ICD-10-CM | POA: Diagnosis not present

## 2015-09-04 DIAGNOSIS — E119 Type 2 diabetes mellitus without complications: Secondary | ICD-10-CM | POA: Diagnosis not present

## 2015-09-04 DIAGNOSIS — Z794 Long term (current) use of insulin: Secondary | ICD-10-CM | POA: Diagnosis not present

## 2015-09-04 DIAGNOSIS — M353 Polymyalgia rheumatica: Secondary | ICD-10-CM | POA: Diagnosis not present

## 2015-09-04 DIAGNOSIS — R600 Localized edema: Secondary | ICD-10-CM | POA: Diagnosis not present

## 2015-09-07 DIAGNOSIS — C9201 Acute myeloblastic leukemia, in remission: Secondary | ICD-10-CM | POA: Diagnosis not present

## 2015-09-09 DIAGNOSIS — C9200 Acute myeloblastic leukemia, not having achieved remission: Secondary | ICD-10-CM | POA: Diagnosis not present

## 2015-09-11 DIAGNOSIS — M353 Polymyalgia rheumatica: Secondary | ICD-10-CM | POA: Diagnosis not present

## 2015-09-11 DIAGNOSIS — I10 Essential (primary) hypertension: Secondary | ICD-10-CM | POA: Diagnosis not present

## 2015-09-11 DIAGNOSIS — D649 Anemia, unspecified: Secondary | ICD-10-CM | POA: Diagnosis not present

## 2015-09-11 DIAGNOSIS — C9201 Acute myeloblastic leukemia, in remission: Secondary | ICD-10-CM | POA: Diagnosis not present

## 2015-09-11 DIAGNOSIS — R251 Tremor, unspecified: Secondary | ICD-10-CM | POA: Diagnosis not present

## 2015-09-11 DIAGNOSIS — D696 Thrombocytopenia, unspecified: Secondary | ICD-10-CM | POA: Diagnosis not present

## 2015-09-11 DIAGNOSIS — R600 Localized edema: Secondary | ICD-10-CM | POA: Diagnosis not present

## 2015-09-11 DIAGNOSIS — E039 Hypothyroidism, unspecified: Secondary | ICD-10-CM | POA: Diagnosis not present

## 2015-09-15 DIAGNOSIS — C9201 Acute myeloblastic leukemia, in remission: Secondary | ICD-10-CM | POA: Diagnosis not present

## 2015-09-15 DIAGNOSIS — I10 Essential (primary) hypertension: Secondary | ICD-10-CM | POA: Diagnosis not present

## 2015-09-15 DIAGNOSIS — D649 Anemia, unspecified: Secondary | ICD-10-CM | POA: Diagnosis not present

## 2015-09-15 DIAGNOSIS — R600 Localized edema: Secondary | ICD-10-CM | POA: Diagnosis not present

## 2015-09-15 DIAGNOSIS — Z79899 Other long term (current) drug therapy: Secondary | ICD-10-CM | POA: Diagnosis not present

## 2015-09-15 DIAGNOSIS — Z794 Long term (current) use of insulin: Secondary | ICD-10-CM | POA: Diagnosis not present

## 2015-09-15 DIAGNOSIS — E119 Type 2 diabetes mellitus without complications: Secondary | ICD-10-CM | POA: Diagnosis not present

## 2015-09-15 DIAGNOSIS — M353 Polymyalgia rheumatica: Secondary | ICD-10-CM | POA: Diagnosis not present

## 2015-09-15 DIAGNOSIS — Z9221 Personal history of antineoplastic chemotherapy: Secondary | ICD-10-CM | POA: Diagnosis not present

## 2015-09-15 DIAGNOSIS — G4700 Insomnia, unspecified: Secondary | ICD-10-CM | POA: Diagnosis not present

## 2015-09-15 DIAGNOSIS — N179 Acute kidney failure, unspecified: Secondary | ICD-10-CM | POA: Diagnosis not present

## 2015-09-15 DIAGNOSIS — E039 Hypothyroidism, unspecified: Secondary | ICD-10-CM | POA: Diagnosis not present

## 2015-09-15 DIAGNOSIS — D696 Thrombocytopenia, unspecified: Secondary | ICD-10-CM | POA: Diagnosis not present

## 2015-09-15 DIAGNOSIS — R251 Tremor, unspecified: Secondary | ICD-10-CM | POA: Diagnosis not present

## 2015-09-18 DIAGNOSIS — D696 Thrombocytopenia, unspecified: Secondary | ICD-10-CM | POA: Diagnosis not present

## 2015-09-18 DIAGNOSIS — E039 Hypothyroidism, unspecified: Secondary | ICD-10-CM | POA: Diagnosis not present

## 2015-09-18 DIAGNOSIS — C9200 Acute myeloblastic leukemia, not having achieved remission: Secondary | ICD-10-CM | POA: Diagnosis not present

## 2015-09-18 DIAGNOSIS — Z79899 Other long term (current) drug therapy: Secondary | ICD-10-CM | POA: Diagnosis not present

## 2015-09-18 DIAGNOSIS — I10 Essential (primary) hypertension: Secondary | ICD-10-CM | POA: Diagnosis not present

## 2015-09-18 DIAGNOSIS — I2510 Atherosclerotic heart disease of native coronary artery without angina pectoris: Secondary | ICD-10-CM | POA: Diagnosis not present

## 2015-09-18 DIAGNOSIS — C9201 Acute myeloblastic leukemia, in remission: Secondary | ICD-10-CM | POA: Diagnosis not present

## 2015-09-18 DIAGNOSIS — J45909 Unspecified asthma, uncomplicated: Secondary | ICD-10-CM | POA: Diagnosis not present

## 2015-09-18 DIAGNOSIS — Z9221 Personal history of antineoplastic chemotherapy: Secondary | ICD-10-CM | POA: Diagnosis not present

## 2015-09-18 DIAGNOSIS — R251 Tremor, unspecified: Secondary | ICD-10-CM | POA: Diagnosis not present

## 2015-09-18 DIAGNOSIS — E119 Type 2 diabetes mellitus without complications: Secondary | ICD-10-CM | POA: Diagnosis not present

## 2015-09-18 DIAGNOSIS — M353 Polymyalgia rheumatica: Secondary | ICD-10-CM | POA: Diagnosis not present

## 2015-09-18 DIAGNOSIS — G4700 Insomnia, unspecified: Secondary | ICD-10-CM | POA: Diagnosis not present

## 2015-09-18 DIAGNOSIS — Z794 Long term (current) use of insulin: Secondary | ICD-10-CM | POA: Diagnosis not present

## 2015-09-18 DIAGNOSIS — E876 Hypokalemia: Secondary | ICD-10-CM | POA: Diagnosis not present

## 2015-09-18 DIAGNOSIS — Z7952 Long term (current) use of systemic steroids: Secondary | ICD-10-CM | POA: Diagnosis not present

## 2015-09-22 DIAGNOSIS — D696 Thrombocytopenia, unspecified: Secondary | ICD-10-CM | POA: Diagnosis not present

## 2015-09-22 DIAGNOSIS — M353 Polymyalgia rheumatica: Secondary | ICD-10-CM | POA: Diagnosis not present

## 2015-09-22 DIAGNOSIS — K5900 Constipation, unspecified: Secondary | ICD-10-CM | POA: Diagnosis not present

## 2015-09-22 DIAGNOSIS — I10 Essential (primary) hypertension: Secondary | ICD-10-CM | POA: Diagnosis not present

## 2015-09-22 DIAGNOSIS — C9201 Acute myeloblastic leukemia, in remission: Secondary | ICD-10-CM | POA: Diagnosis not present

## 2015-09-22 DIAGNOSIS — E039 Hypothyroidism, unspecified: Secondary | ICD-10-CM | POA: Diagnosis not present

## 2015-09-22 DIAGNOSIS — D649 Anemia, unspecified: Secondary | ICD-10-CM | POA: Diagnosis not present

## 2015-09-22 DIAGNOSIS — R600 Localized edema: Secondary | ICD-10-CM | POA: Diagnosis not present

## 2015-09-22 DIAGNOSIS — C9200 Acute myeloblastic leukemia, not having achieved remission: Secondary | ICD-10-CM | POA: Diagnosis not present

## 2015-10-05 DIAGNOSIS — M25532 Pain in left wrist: Secondary | ICD-10-CM | POA: Diagnosis not present

## 2015-10-09 DIAGNOSIS — I10 Essential (primary) hypertension: Secondary | ICD-10-CM | POA: Diagnosis not present

## 2015-10-09 DIAGNOSIS — R471 Dysarthria and anarthria: Secondary | ICD-10-CM | POA: Diagnosis not present

## 2015-10-09 DIAGNOSIS — R600 Localized edema: Secondary | ICD-10-CM | POA: Diagnosis not present

## 2015-10-09 DIAGNOSIS — E538 Deficiency of other specified B group vitamins: Secondary | ICD-10-CM | POA: Diagnosis not present

## 2015-10-09 DIAGNOSIS — M353 Polymyalgia rheumatica: Secondary | ICD-10-CM | POA: Diagnosis not present

## 2015-10-09 DIAGNOSIS — E119 Type 2 diabetes mellitus without complications: Secondary | ICD-10-CM | POA: Diagnosis not present

## 2015-10-09 DIAGNOSIS — D649 Anemia, unspecified: Secondary | ICD-10-CM | POA: Diagnosis not present

## 2015-10-09 DIAGNOSIS — D693 Immune thrombocytopenic purpura: Secondary | ICD-10-CM | POA: Diagnosis not present

## 2015-10-09 DIAGNOSIS — E8770 Fluid overload, unspecified: Secondary | ICD-10-CM | POA: Diagnosis not present

## 2015-10-09 DIAGNOSIS — Z7901 Long term (current) use of anticoagulants: Secondary | ICD-10-CM | POA: Diagnosis not present

## 2015-10-09 DIAGNOSIS — J383 Other diseases of vocal cords: Secondary | ICD-10-CM | POA: Diagnosis not present

## 2015-10-09 DIAGNOSIS — C9201 Acute myeloblastic leukemia, in remission: Secondary | ICD-10-CM | POA: Diagnosis not present

## 2015-10-09 DIAGNOSIS — I2510 Atherosclerotic heart disease of native coronary artery without angina pectoris: Secondary | ICD-10-CM | POA: Diagnosis not present

## 2015-10-09 DIAGNOSIS — Z7952 Long term (current) use of systemic steroids: Secondary | ICD-10-CM | POA: Diagnosis not present

## 2015-10-09 DIAGNOSIS — D696 Thrombocytopenia, unspecified: Secondary | ICD-10-CM | POA: Diagnosis not present

## 2015-10-09 DIAGNOSIS — C9200 Acute myeloblastic leukemia, not having achieved remission: Secondary | ICD-10-CM | POA: Diagnosis not present

## 2015-10-09 DIAGNOSIS — M810 Age-related osteoporosis without current pathological fracture: Secondary | ICD-10-CM | POA: Diagnosis not present

## 2015-10-09 DIAGNOSIS — N179 Acute kidney failure, unspecified: Secondary | ICD-10-CM | POA: Diagnosis not present

## 2015-10-09 DIAGNOSIS — E039 Hypothyroidism, unspecified: Secondary | ICD-10-CM | POA: Diagnosis not present

## 2015-10-09 DIAGNOSIS — K219 Gastro-esophageal reflux disease without esophagitis: Secondary | ICD-10-CM | POA: Diagnosis not present

## 2015-10-27 DIAGNOSIS — D696 Thrombocytopenia, unspecified: Secondary | ICD-10-CM | POA: Diagnosis not present

## 2015-10-27 DIAGNOSIS — C9200 Acute myeloblastic leukemia, not having achieved remission: Secondary | ICD-10-CM | POA: Diagnosis not present

## 2015-10-28 ENCOUNTER — Telehealth: Payer: Self-pay | Admitting: Oncology

## 2015-10-28 ENCOUNTER — Telehealth: Payer: Self-pay | Admitting: *Deleted

## 2015-10-28 NOTE — Telephone Encounter (Signed)
PATIENT WIFE CALLED TO CX 7/17 LAB/UF. PER WIFE PT IN [facility]OSP AT [facility] WILL BE IN TOUCH WITH DR. SHERRILL. DESK NURSE AWARE.

## 2015-10-28 NOTE — Telephone Encounter (Signed)
Patient's wife called and left message to cancel appts. Message forwarded to the desk RN

## 2015-10-30 DIAGNOSIS — R05 Cough: Secondary | ICD-10-CM | POA: Diagnosis not present

## 2015-10-30 DIAGNOSIS — Z881 Allergy status to other antibiotic agents status: Secondary | ICD-10-CM | POA: Diagnosis not present

## 2015-10-30 DIAGNOSIS — M353 Polymyalgia rheumatica: Secondary | ICD-10-CM | POA: Diagnosis not present

## 2015-10-30 DIAGNOSIS — R569 Unspecified convulsions: Secondary | ICD-10-CM | POA: Diagnosis not present

## 2015-10-30 DIAGNOSIS — G259 Extrapyramidal and movement disorder, unspecified: Secondary | ICD-10-CM | POA: Diagnosis not present

## 2015-10-30 DIAGNOSIS — B029 Zoster without complications: Secondary | ICD-10-CM | POA: Diagnosis not present

## 2015-10-30 DIAGNOSIS — Z7952 Long term (current) use of systemic steroids: Secondary | ICD-10-CM | POA: Diagnosis not present

## 2015-10-30 DIAGNOSIS — K589 Irritable bowel syndrome without diarrhea: Secondary | ICD-10-CM | POA: Diagnosis not present

## 2015-10-30 DIAGNOSIS — Z9049 Acquired absence of other specified parts of digestive tract: Secondary | ICD-10-CM | POA: Diagnosis not present

## 2015-10-30 DIAGNOSIS — G248 Other dystonia: Secondary | ICD-10-CM | POA: Diagnosis not present

## 2015-10-30 DIAGNOSIS — D693 Immune thrombocytopenic purpura: Secondary | ICD-10-CM | POA: Diagnosis not present

## 2015-10-30 DIAGNOSIS — J45909 Unspecified asthma, uncomplicated: Secondary | ICD-10-CM | POA: Diagnosis not present

## 2015-10-30 DIAGNOSIS — E119 Type 2 diabetes mellitus without complications: Secondary | ICD-10-CM | POA: Diagnosis not present

## 2015-10-30 DIAGNOSIS — Z885 Allergy status to narcotic agent status: Secondary | ICD-10-CM | POA: Diagnosis not present

## 2015-10-30 DIAGNOSIS — Z8661 Personal history of infections of the central nervous system: Secondary | ICD-10-CM | POA: Diagnosis not present

## 2015-10-30 DIAGNOSIS — I10 Essential (primary) hypertension: Secondary | ICD-10-CM | POA: Diagnosis not present

## 2015-10-30 DIAGNOSIS — C9200 Acute myeloblastic leukemia, not having achieved remission: Secondary | ICD-10-CM | POA: Diagnosis not present

## 2015-10-30 DIAGNOSIS — D696 Thrombocytopenia, unspecified: Secondary | ICD-10-CM | POA: Diagnosis not present

## 2015-10-30 DIAGNOSIS — M810 Age-related osteoporosis without current pathological fracture: Secondary | ICD-10-CM | POA: Diagnosis not present

## 2015-10-30 DIAGNOSIS — Z886 Allergy status to analgesic agent status: Secondary | ICD-10-CM | POA: Diagnosis not present

## 2015-10-30 DIAGNOSIS — E039 Hypothyroidism, unspecified: Secondary | ICD-10-CM | POA: Diagnosis not present

## 2015-10-30 DIAGNOSIS — K219 Gastro-esophageal reflux disease without esophagitis: Secondary | ICD-10-CM | POA: Diagnosis not present

## 2015-10-30 DIAGNOSIS — Z794 Long term (current) use of insulin: Secondary | ICD-10-CM | POA: Diagnosis not present

## 2015-10-30 DIAGNOSIS — Z9089 Acquired absence of other organs: Secondary | ICD-10-CM | POA: Diagnosis not present

## 2015-10-30 DIAGNOSIS — I2510 Atherosclerotic heart disease of native coronary artery without angina pectoris: Secondary | ICD-10-CM | POA: Diagnosis not present

## 2015-11-02 ENCOUNTER — Other Ambulatory Visit: Payer: Medicare Other

## 2015-11-02 ENCOUNTER — Ambulatory Visit: Payer: Medicare Other | Admitting: Oncology

## 2015-11-02 DIAGNOSIS — J342 Deviated nasal septum: Secondary | ICD-10-CM | POA: Diagnosis not present

## 2015-11-02 DIAGNOSIS — H6122 Impacted cerumen, left ear: Secondary | ICD-10-CM | POA: Diagnosis not present

## 2015-11-04 DIAGNOSIS — Z79899 Other long term (current) drug therapy: Secondary | ICD-10-CM | POA: Diagnosis not present

## 2015-11-04 DIAGNOSIS — Z9221 Personal history of antineoplastic chemotherapy: Secondary | ICD-10-CM | POA: Diagnosis not present

## 2015-11-04 DIAGNOSIS — Z794 Long term (current) use of insulin: Secondary | ICD-10-CM | POA: Diagnosis not present

## 2015-11-04 DIAGNOSIS — Z881 Allergy status to other antibiotic agents status: Secondary | ICD-10-CM | POA: Diagnosis not present

## 2015-11-04 DIAGNOSIS — R251 Tremor, unspecified: Secondary | ICD-10-CM | POA: Diagnosis not present

## 2015-11-04 DIAGNOSIS — E119 Type 2 diabetes mellitus without complications: Secondary | ICD-10-CM | POA: Diagnosis not present

## 2015-11-04 DIAGNOSIS — Z886 Allergy status to analgesic agent status: Secondary | ICD-10-CM | POA: Diagnosis not present

## 2015-11-04 DIAGNOSIS — J383 Other diseases of vocal cords: Secondary | ICD-10-CM | POA: Diagnosis not present

## 2015-11-04 DIAGNOSIS — R471 Dysarthria and anarthria: Secondary | ICD-10-CM | POA: Diagnosis not present

## 2015-11-04 DIAGNOSIS — Z885 Allergy status to narcotic agent status: Secondary | ICD-10-CM | POA: Diagnosis not present

## 2015-11-04 DIAGNOSIS — I2510 Atherosclerotic heart disease of native coronary artery without angina pectoris: Secondary | ICD-10-CM | POA: Diagnosis not present

## 2015-11-04 DIAGNOSIS — I10 Essential (primary) hypertension: Secondary | ICD-10-CM | POA: Diagnosis not present

## 2015-11-04 DIAGNOSIS — J45909 Unspecified asthma, uncomplicated: Secondary | ICD-10-CM | POA: Diagnosis not present

## 2015-11-04 DIAGNOSIS — Z888 Allergy status to other drugs, medicaments and biological substances status: Secondary | ICD-10-CM | POA: Diagnosis not present

## 2015-11-04 DIAGNOSIS — R490 Dysphonia: Secondary | ICD-10-CM | POA: Diagnosis not present

## 2015-11-04 DIAGNOSIS — Z856 Personal history of leukemia: Secondary | ICD-10-CM | POA: Diagnosis not present

## 2015-11-04 DIAGNOSIS — E039 Hypothyroidism, unspecified: Secondary | ICD-10-CM | POA: Diagnosis not present

## 2015-11-06 DIAGNOSIS — C9201 Acute myeloblastic leukemia, in remission: Secondary | ICD-10-CM | POA: Diagnosis not present

## 2015-11-06 DIAGNOSIS — G249 Dystonia, unspecified: Secondary | ICD-10-CM | POA: Diagnosis not present

## 2015-11-06 DIAGNOSIS — R479 Unspecified speech disturbances: Secondary | ICD-10-CM | POA: Diagnosis not present

## 2015-11-06 DIAGNOSIS — C92Z0 Other myeloid leukemia not having achieved remission: Secondary | ICD-10-CM | POA: Diagnosis not present

## 2015-11-09 DIAGNOSIS — C92Z0 Other myeloid leukemia not having achieved remission: Secondary | ICD-10-CM | POA: Diagnosis not present

## 2015-11-09 DIAGNOSIS — G249 Dystonia, unspecified: Secondary | ICD-10-CM | POA: Diagnosis not present

## 2015-11-09 DIAGNOSIS — C9201 Acute myeloblastic leukemia, in remission: Secondary | ICD-10-CM | POA: Diagnosis not present

## 2015-11-09 DIAGNOSIS — R479 Unspecified speech disturbances: Secondary | ICD-10-CM | POA: Diagnosis not present

## 2015-11-10 DIAGNOSIS — K5900 Constipation, unspecified: Secondary | ICD-10-CM | POA: Diagnosis not present

## 2015-11-10 DIAGNOSIS — Z9889 Other specified postprocedural states: Secondary | ICD-10-CM | POA: Diagnosis not present

## 2015-11-10 DIAGNOSIS — R600 Localized edema: Secondary | ICD-10-CM | POA: Diagnosis not present

## 2015-11-10 DIAGNOSIS — Z794 Long term (current) use of insulin: Secondary | ICD-10-CM | POA: Diagnosis not present

## 2015-11-10 DIAGNOSIS — M353 Polymyalgia rheumatica: Secondary | ICD-10-CM | POA: Diagnosis not present

## 2015-11-10 DIAGNOSIS — D649 Anemia, unspecified: Secondary | ICD-10-CM | POA: Diagnosis not present

## 2015-11-10 DIAGNOSIS — D696 Thrombocytopenia, unspecified: Secondary | ICD-10-CM | POA: Diagnosis not present

## 2015-11-10 DIAGNOSIS — I2510 Atherosclerotic heart disease of native coronary artery without angina pectoris: Secondary | ICD-10-CM | POA: Diagnosis not present

## 2015-11-10 DIAGNOSIS — Z955 Presence of coronary angioplasty implant and graft: Secondary | ICD-10-CM | POA: Diagnosis not present

## 2015-11-10 DIAGNOSIS — E119 Type 2 diabetes mellitus without complications: Secondary | ICD-10-CM | POA: Diagnosis not present

## 2015-11-10 DIAGNOSIS — Z8719 Personal history of other diseases of the digestive system: Secondary | ICD-10-CM | POA: Diagnosis not present

## 2015-11-10 DIAGNOSIS — Z79899 Other long term (current) drug therapy: Secondary | ICD-10-CM | POA: Diagnosis not present

## 2015-11-10 DIAGNOSIS — C92Z1 Other myeloid leukemia, in remission: Secondary | ICD-10-CM | POA: Diagnosis not present

## 2015-11-10 DIAGNOSIS — G4700 Insomnia, unspecified: Secondary | ICD-10-CM | POA: Diagnosis not present

## 2015-11-10 DIAGNOSIS — C9201 Acute myeloblastic leukemia, in remission: Secondary | ICD-10-CM | POA: Diagnosis not present

## 2015-11-10 DIAGNOSIS — E876 Hypokalemia: Secondary | ICD-10-CM | POA: Diagnosis not present

## 2015-11-10 DIAGNOSIS — I10 Essential (primary) hypertension: Secondary | ICD-10-CM | POA: Diagnosis not present

## 2015-11-10 DIAGNOSIS — E039 Hypothyroidism, unspecified: Secondary | ICD-10-CM | POA: Diagnosis not present

## 2015-11-12 DIAGNOSIS — R479 Unspecified speech disturbances: Secondary | ICD-10-CM | POA: Diagnosis not present

## 2015-11-12 DIAGNOSIS — R259 Unspecified abnormal involuntary movements: Secondary | ICD-10-CM | POA: Diagnosis not present

## 2015-11-13 DIAGNOSIS — I2510 Atherosclerotic heart disease of native coronary artery without angina pectoris: Secondary | ICD-10-CM | POA: Diagnosis not present

## 2015-11-13 DIAGNOSIS — I10 Essential (primary) hypertension: Secondary | ICD-10-CM | POA: Diagnosis not present

## 2015-11-13 DIAGNOSIS — G248 Other dystonia: Secondary | ICD-10-CM | POA: Diagnosis not present

## 2015-11-13 DIAGNOSIS — G253 Myoclonus: Secondary | ICD-10-CM | POA: Diagnosis not present

## 2015-11-13 DIAGNOSIS — Z888 Allergy status to other drugs, medicaments and biological substances status: Secondary | ICD-10-CM | POA: Diagnosis not present

## 2015-11-13 DIAGNOSIS — Z882 Allergy status to sulfonamides status: Secondary | ICD-10-CM | POA: Diagnosis not present

## 2015-11-13 DIAGNOSIS — C9200 Acute myeloblastic leukemia, not having achieved remission: Secondary | ICD-10-CM | POA: Diagnosis not present

## 2015-11-13 DIAGNOSIS — Z881 Allergy status to other antibiotic agents status: Secondary | ICD-10-CM | POA: Diagnosis not present

## 2015-11-13 DIAGNOSIS — Z794 Long term (current) use of insulin: Secondary | ICD-10-CM | POA: Diagnosis not present

## 2015-11-13 DIAGNOSIS — Z79899 Other long term (current) drug therapy: Secondary | ICD-10-CM | POA: Diagnosis not present

## 2015-11-13 DIAGNOSIS — E119 Type 2 diabetes mellitus without complications: Secondary | ICD-10-CM | POA: Diagnosis not present

## 2015-11-13 DIAGNOSIS — Z886 Allergy status to analgesic agent status: Secondary | ICD-10-CM | POA: Diagnosis not present

## 2015-11-13 DIAGNOSIS — J383 Other diseases of vocal cords: Secondary | ICD-10-CM | POA: Diagnosis not present

## 2015-11-24 DIAGNOSIS — R471 Dysarthria and anarthria: Secondary | ICD-10-CM | POA: Diagnosis not present

## 2015-11-24 DIAGNOSIS — C9201 Acute myeloblastic leukemia, in remission: Secondary | ICD-10-CM | POA: Diagnosis not present

## 2015-11-27 DIAGNOSIS — Z79899 Other long term (current) drug therapy: Secondary | ICD-10-CM | POA: Diagnosis not present

## 2015-11-27 DIAGNOSIS — C9201 Acute myeloblastic leukemia, in remission: Secondary | ICD-10-CM | POA: Diagnosis not present

## 2015-11-27 DIAGNOSIS — G253 Myoclonus: Secondary | ICD-10-CM | POA: Diagnosis not present

## 2015-11-27 DIAGNOSIS — Z885 Allergy status to narcotic agent status: Secondary | ICD-10-CM | POA: Diagnosis not present

## 2015-11-27 DIAGNOSIS — Z7952 Long term (current) use of systemic steroids: Secondary | ICD-10-CM | POA: Diagnosis not present

## 2015-11-27 DIAGNOSIS — E119 Type 2 diabetes mellitus without complications: Secondary | ICD-10-CM | POA: Diagnosis not present

## 2015-11-27 DIAGNOSIS — Z881 Allergy status to other antibiotic agents status: Secondary | ICD-10-CM | POA: Diagnosis not present

## 2015-11-27 DIAGNOSIS — G248 Other dystonia: Secondary | ICD-10-CM | POA: Diagnosis not present

## 2015-11-27 DIAGNOSIS — C701 Malignant neoplasm of spinal meninges: Secondary | ICD-10-CM | POA: Diagnosis not present

## 2015-11-27 DIAGNOSIS — I2510 Atherosclerotic heart disease of native coronary artery without angina pectoris: Secondary | ICD-10-CM | POA: Diagnosis not present

## 2015-11-27 DIAGNOSIS — I10 Essential (primary) hypertension: Secondary | ICD-10-CM | POA: Diagnosis not present

## 2015-11-27 DIAGNOSIS — E039 Hypothyroidism, unspecified: Secondary | ICD-10-CM | POA: Diagnosis not present

## 2015-11-27 DIAGNOSIS — Z888 Allergy status to other drugs, medicaments and biological substances status: Secondary | ICD-10-CM | POA: Diagnosis not present

## 2015-11-27 DIAGNOSIS — Z794 Long term (current) use of insulin: Secondary | ICD-10-CM | POA: Diagnosis not present

## 2015-11-27 DIAGNOSIS — C92Z1 Other myeloid leukemia, in remission: Secondary | ICD-10-CM | POA: Diagnosis not present

## 2015-11-30 DIAGNOSIS — R471 Dysarthria and anarthria: Secondary | ICD-10-CM | POA: Diagnosis not present

## 2015-11-30 DIAGNOSIS — C9201 Acute myeloblastic leukemia, in remission: Secondary | ICD-10-CM | POA: Diagnosis not present

## 2015-12-03 DIAGNOSIS — C9201 Acute myeloblastic leukemia, in remission: Secondary | ICD-10-CM | POA: Diagnosis not present

## 2015-12-03 DIAGNOSIS — R471 Dysarthria and anarthria: Secondary | ICD-10-CM | POA: Diagnosis not present

## 2015-12-08 DIAGNOSIS — C9201 Acute myeloblastic leukemia, in remission: Secondary | ICD-10-CM | POA: Diagnosis not present

## 2015-12-08 DIAGNOSIS — R471 Dysarthria and anarthria: Secondary | ICD-10-CM | POA: Diagnosis not present

## 2015-12-15 DIAGNOSIS — R251 Tremor, unspecified: Secondary | ICD-10-CM | POA: Diagnosis not present

## 2015-12-15 DIAGNOSIS — R471 Dysarthria and anarthria: Secondary | ICD-10-CM | POA: Diagnosis not present

## 2015-12-15 DIAGNOSIS — G253 Myoclonus: Secondary | ICD-10-CM | POA: Diagnosis not present

## 2015-12-15 DIAGNOSIS — C9201 Acute myeloblastic leukemia, in remission: Secondary | ICD-10-CM | POA: Diagnosis not present

## 2015-12-15 DIAGNOSIS — R259 Unspecified abnormal involuntary movements: Secondary | ICD-10-CM | POA: Diagnosis not present

## 2015-12-16 DIAGNOSIS — R251 Tremor, unspecified: Secondary | ICD-10-CM | POA: Diagnosis not present

## 2015-12-16 DIAGNOSIS — R259 Unspecified abnormal involuntary movements: Secondary | ICD-10-CM | POA: Diagnosis not present

## 2015-12-16 DIAGNOSIS — G253 Myoclonus: Secondary | ICD-10-CM | POA: Diagnosis not present

## 2015-12-24 DIAGNOSIS — C9201 Acute myeloblastic leukemia, in remission: Secondary | ICD-10-CM | POA: Diagnosis not present

## 2015-12-24 DIAGNOSIS — R471 Dysarthria and anarthria: Secondary | ICD-10-CM | POA: Diagnosis not present

## 2015-12-25 DIAGNOSIS — I2510 Atherosclerotic heart disease of native coronary artery without angina pectoris: Secondary | ICD-10-CM | POA: Diagnosis not present

## 2015-12-25 DIAGNOSIS — K5900 Constipation, unspecified: Secondary | ICD-10-CM | POA: Diagnosis not present

## 2015-12-25 DIAGNOSIS — Z7952 Long term (current) use of systemic steroids: Secondary | ICD-10-CM | POA: Diagnosis not present

## 2015-12-25 DIAGNOSIS — M353 Polymyalgia rheumatica: Secondary | ICD-10-CM | POA: Diagnosis not present

## 2015-12-25 DIAGNOSIS — Z794 Long term (current) use of insulin: Secondary | ICD-10-CM | POA: Diagnosis not present

## 2015-12-25 DIAGNOSIS — E876 Hypokalemia: Secondary | ICD-10-CM | POA: Diagnosis not present

## 2015-12-25 DIAGNOSIS — C92Z0 Other myeloid leukemia not having achieved remission: Secondary | ICD-10-CM | POA: Diagnosis not present

## 2015-12-25 DIAGNOSIS — D649 Anemia, unspecified: Secondary | ICD-10-CM | POA: Diagnosis not present

## 2015-12-25 DIAGNOSIS — J45909 Unspecified asthma, uncomplicated: Secondary | ICD-10-CM | POA: Diagnosis not present

## 2015-12-25 DIAGNOSIS — Z9221 Personal history of antineoplastic chemotherapy: Secondary | ICD-10-CM | POA: Diagnosis not present

## 2015-12-25 DIAGNOSIS — I10 Essential (primary) hypertension: Secondary | ICD-10-CM | POA: Diagnosis not present

## 2015-12-25 DIAGNOSIS — Z79899 Other long term (current) drug therapy: Secondary | ICD-10-CM | POA: Diagnosis not present

## 2015-12-25 DIAGNOSIS — C9201 Acute myeloblastic leukemia, in remission: Secondary | ICD-10-CM | POA: Diagnosis not present

## 2015-12-25 DIAGNOSIS — E039 Hypothyroidism, unspecified: Secondary | ICD-10-CM | POA: Diagnosis not present

## 2015-12-25 DIAGNOSIS — G4700 Insomnia, unspecified: Secondary | ICD-10-CM | POA: Diagnosis not present

## 2015-12-25 DIAGNOSIS — D696 Thrombocytopenia, unspecified: Secondary | ICD-10-CM | POA: Diagnosis not present

## 2016-01-05 DIAGNOSIS — C9201 Acute myeloblastic leukemia, in remission: Secondary | ICD-10-CM | POA: Diagnosis not present

## 2016-01-05 DIAGNOSIS — R471 Dysarthria and anarthria: Secondary | ICD-10-CM | POA: Diagnosis not present

## 2016-01-06 DIAGNOSIS — E039 Hypothyroidism, unspecified: Secondary | ICD-10-CM | POA: Diagnosis not present

## 2016-01-06 DIAGNOSIS — G20 Parkinson's disease: Secondary | ICD-10-CM | POA: Diagnosis not present

## 2016-01-06 DIAGNOSIS — I10 Essential (primary) hypertension: Secondary | ICD-10-CM | POA: Diagnosis not present

## 2016-01-06 DIAGNOSIS — C92Z0 Other myeloid leukemia not having achieved remission: Secondary | ICD-10-CM | POA: Diagnosis not present

## 2016-01-06 DIAGNOSIS — J383 Other diseases of vocal cords: Secondary | ICD-10-CM | POA: Diagnosis not present

## 2016-01-06 DIAGNOSIS — J45909 Unspecified asthma, uncomplicated: Secondary | ICD-10-CM | POA: Diagnosis not present

## 2016-01-06 DIAGNOSIS — E119 Type 2 diabetes mellitus without complications: Secondary | ICD-10-CM | POA: Diagnosis not present

## 2016-01-06 DIAGNOSIS — Z885 Allergy status to narcotic agent status: Secondary | ICD-10-CM | POA: Diagnosis not present

## 2016-01-06 DIAGNOSIS — Z794 Long term (current) use of insulin: Secondary | ICD-10-CM | POA: Diagnosis not present

## 2016-01-06 DIAGNOSIS — Z886 Allergy status to analgesic agent status: Secondary | ICD-10-CM | POA: Diagnosis not present

## 2016-01-06 DIAGNOSIS — Z881 Allergy status to other antibiotic agents status: Secondary | ICD-10-CM | POA: Diagnosis not present

## 2016-01-06 DIAGNOSIS — R490 Dysphonia: Secondary | ICD-10-CM | POA: Diagnosis not present

## 2016-01-06 DIAGNOSIS — R259 Unspecified abnormal involuntary movements: Secondary | ICD-10-CM | POA: Diagnosis not present

## 2016-01-06 DIAGNOSIS — I2510 Atherosclerotic heart disease of native coronary artery without angina pectoris: Secondary | ICD-10-CM | POA: Diagnosis not present

## 2016-01-06 DIAGNOSIS — Z888 Allergy status to other drugs, medicaments and biological substances status: Secondary | ICD-10-CM | POA: Diagnosis not present

## 2016-01-06 DIAGNOSIS — Z79899 Other long term (current) drug therapy: Secondary | ICD-10-CM | POA: Diagnosis not present

## 2016-01-11 DIAGNOSIS — R471 Dysarthria and anarthria: Secondary | ICD-10-CM | POA: Diagnosis not present

## 2016-01-11 DIAGNOSIS — C9201 Acute myeloblastic leukemia, in remission: Secondary | ICD-10-CM | POA: Diagnosis not present

## 2016-01-16 DIAGNOSIS — Z23 Encounter for immunization: Secondary | ICD-10-CM | POA: Diagnosis not present

## 2016-01-22 DIAGNOSIS — I10 Essential (primary) hypertension: Secondary | ICD-10-CM | POA: Diagnosis not present

## 2016-01-22 DIAGNOSIS — I878 Other specified disorders of veins: Secondary | ICD-10-CM | POA: Diagnosis not present

## 2016-01-22 DIAGNOSIS — C9200 Acute myeloblastic leukemia, not having achieved remission: Secondary | ICD-10-CM | POA: Diagnosis not present

## 2016-01-22 DIAGNOSIS — E039 Hypothyroidism, unspecified: Secondary | ICD-10-CM | POA: Diagnosis not present

## 2016-01-22 DIAGNOSIS — I2510 Atherosclerotic heart disease of native coronary artery without angina pectoris: Secondary | ICD-10-CM | POA: Diagnosis not present

## 2016-01-22 DIAGNOSIS — Z9889 Other specified postprocedural states: Secondary | ICD-10-CM | POA: Diagnosis not present

## 2016-01-22 DIAGNOSIS — L97911 Non-pressure chronic ulcer of unspecified part of right lower leg limited to breakdown of skin: Secondary | ICD-10-CM | POA: Diagnosis not present

## 2016-01-22 DIAGNOSIS — I890 Lymphedema, not elsewhere classified: Secondary | ICD-10-CM | POA: Diagnosis not present

## 2016-01-22 DIAGNOSIS — E119 Type 2 diabetes mellitus without complications: Secondary | ICD-10-CM | POA: Diagnosis not present

## 2016-01-27 DIAGNOSIS — I10 Essential (primary) hypertension: Secondary | ICD-10-CM | POA: Diagnosis not present

## 2016-01-27 DIAGNOSIS — E119 Type 2 diabetes mellitus without complications: Secondary | ICD-10-CM | POA: Diagnosis not present

## 2016-01-27 DIAGNOSIS — E039 Hypothyroidism, unspecified: Secondary | ICD-10-CM | POA: Diagnosis not present

## 2016-01-27 DIAGNOSIS — I878 Other specified disorders of veins: Secondary | ICD-10-CM | POA: Diagnosis not present

## 2016-01-27 DIAGNOSIS — I2510 Atherosclerotic heart disease of native coronary artery without angina pectoris: Secondary | ICD-10-CM | POA: Diagnosis not present

## 2016-01-27 DIAGNOSIS — L97911 Non-pressure chronic ulcer of unspecified part of right lower leg limited to breakdown of skin: Secondary | ICD-10-CM | POA: Diagnosis not present

## 2016-01-27 DIAGNOSIS — I890 Lymphedema, not elsewhere classified: Secondary | ICD-10-CM | POA: Diagnosis not present

## 2016-02-03 DIAGNOSIS — C9201 Acute myeloblastic leukemia, in remission: Secondary | ICD-10-CM | POA: Diagnosis not present

## 2016-02-03 DIAGNOSIS — R471 Dysarthria and anarthria: Secondary | ICD-10-CM | POA: Diagnosis not present

## 2016-02-04 DIAGNOSIS — Z9889 Other specified postprocedural states: Secondary | ICD-10-CM | POA: Diagnosis not present

## 2016-02-04 DIAGNOSIS — E039 Hypothyroidism, unspecified: Secondary | ICD-10-CM | POA: Diagnosis not present

## 2016-02-04 DIAGNOSIS — L97911 Non-pressure chronic ulcer of unspecified part of right lower leg limited to breakdown of skin: Secondary | ICD-10-CM | POA: Diagnosis not present

## 2016-02-04 DIAGNOSIS — I2510 Atherosclerotic heart disease of native coronary artery without angina pectoris: Secondary | ICD-10-CM | POA: Diagnosis not present

## 2016-02-04 DIAGNOSIS — I878 Other specified disorders of veins: Secondary | ICD-10-CM | POA: Diagnosis not present

## 2016-02-04 DIAGNOSIS — E119 Type 2 diabetes mellitus without complications: Secondary | ICD-10-CM | POA: Diagnosis not present

## 2016-02-04 DIAGNOSIS — C9200 Acute myeloblastic leukemia, not having achieved remission: Secondary | ICD-10-CM | POA: Diagnosis not present

## 2016-02-04 DIAGNOSIS — I10 Essential (primary) hypertension: Secondary | ICD-10-CM | POA: Diagnosis not present

## 2016-02-04 DIAGNOSIS — I890 Lymphedema, not elsewhere classified: Secondary | ICD-10-CM | POA: Diagnosis not present

## 2016-02-05 DIAGNOSIS — Z881 Allergy status to other antibiotic agents status: Secondary | ICD-10-CM | POA: Diagnosis not present

## 2016-02-05 DIAGNOSIS — Z885 Allergy status to narcotic agent status: Secondary | ICD-10-CM | POA: Diagnosis not present

## 2016-02-05 DIAGNOSIS — D649 Anemia, unspecified: Secondary | ICD-10-CM | POA: Diagnosis not present

## 2016-02-05 DIAGNOSIS — I10 Essential (primary) hypertension: Secondary | ICD-10-CM | POA: Diagnosis not present

## 2016-02-05 DIAGNOSIS — Z794 Long term (current) use of insulin: Secondary | ICD-10-CM | POA: Diagnosis not present

## 2016-02-05 DIAGNOSIS — J45909 Unspecified asthma, uncomplicated: Secondary | ICD-10-CM | POA: Diagnosis not present

## 2016-02-05 DIAGNOSIS — D693 Immune thrombocytopenic purpura: Secondary | ICD-10-CM | POA: Diagnosis not present

## 2016-02-05 DIAGNOSIS — G20 Parkinson's disease: Secondary | ICD-10-CM | POA: Diagnosis not present

## 2016-02-05 DIAGNOSIS — R251 Tremor, unspecified: Secondary | ICD-10-CM | POA: Diagnosis not present

## 2016-02-05 DIAGNOSIS — C9201 Acute myeloblastic leukemia, in remission: Secondary | ICD-10-CM | POA: Diagnosis not present

## 2016-02-05 DIAGNOSIS — I517 Cardiomegaly: Secondary | ICD-10-CM | POA: Diagnosis not present

## 2016-02-05 DIAGNOSIS — R600 Localized edema: Secondary | ICD-10-CM | POA: Diagnosis not present

## 2016-02-05 DIAGNOSIS — E039 Hypothyroidism, unspecified: Secondary | ICD-10-CM | POA: Diagnosis not present

## 2016-02-05 DIAGNOSIS — D696 Thrombocytopenia, unspecified: Secondary | ICD-10-CM | POA: Diagnosis not present

## 2016-02-05 DIAGNOSIS — E119 Type 2 diabetes mellitus without complications: Secondary | ICD-10-CM | POA: Diagnosis not present

## 2016-02-05 DIAGNOSIS — Z79899 Other long term (current) drug therapy: Secondary | ICD-10-CM | POA: Diagnosis not present

## 2016-02-05 DIAGNOSIS — Z888 Allergy status to other drugs, medicaments and biological substances status: Secondary | ICD-10-CM | POA: Diagnosis not present

## 2016-02-05 DIAGNOSIS — R4701 Aphasia: Secondary | ICD-10-CM | POA: Diagnosis not present

## 2016-02-05 DIAGNOSIS — E876 Hypokalemia: Secondary | ICD-10-CM | POA: Diagnosis not present

## 2016-02-05 DIAGNOSIS — I2510 Atherosclerotic heart disease of native coronary artery without angina pectoris: Secondary | ICD-10-CM | POA: Diagnosis not present

## 2016-02-11 DIAGNOSIS — E119 Type 2 diabetes mellitus without complications: Secondary | ICD-10-CM | POA: Diagnosis not present

## 2016-02-11 DIAGNOSIS — T451X5A Adverse effect of antineoplastic and immunosuppressive drugs, initial encounter: Secondary | ICD-10-CM | POA: Diagnosis not present

## 2016-02-11 DIAGNOSIS — E039 Hypothyroidism, unspecified: Secondary | ICD-10-CM | POA: Diagnosis not present

## 2016-02-11 DIAGNOSIS — L97911 Non-pressure chronic ulcer of unspecified part of right lower leg limited to breakdown of skin: Secondary | ICD-10-CM | POA: Diagnosis not present

## 2016-02-11 DIAGNOSIS — C9200 Acute myeloblastic leukemia, not having achieved remission: Secondary | ICD-10-CM | POA: Diagnosis not present

## 2016-02-11 DIAGNOSIS — I10 Essential (primary) hypertension: Secondary | ICD-10-CM | POA: Diagnosis not present

## 2016-02-11 DIAGNOSIS — I2510 Atherosclerotic heart disease of native coronary artery without angina pectoris: Secondary | ICD-10-CM | POA: Diagnosis not present

## 2016-02-11 DIAGNOSIS — D61810 Antineoplastic chemotherapy induced pancytopenia: Secondary | ICD-10-CM | POA: Diagnosis not present

## 2016-02-16 DIAGNOSIS — M549 Dorsalgia, unspecified: Secondary | ICD-10-CM | POA: Diagnosis not present

## 2016-02-16 DIAGNOSIS — S32010A Wedge compression fracture of first lumbar vertebra, initial encounter for closed fracture: Secondary | ICD-10-CM | POA: Diagnosis not present

## 2016-02-16 DIAGNOSIS — S32020A Wedge compression fracture of second lumbar vertebra, initial encounter for closed fracture: Secondary | ICD-10-CM | POA: Diagnosis not present

## 2016-02-16 DIAGNOSIS — S32030A Wedge compression fracture of third lumbar vertebra, initial encounter for closed fracture: Secondary | ICD-10-CM | POA: Diagnosis not present

## 2016-02-18 DIAGNOSIS — I878 Other specified disorders of veins: Secondary | ICD-10-CM | POA: Diagnosis not present

## 2016-02-18 DIAGNOSIS — I890 Lymphedema, not elsewhere classified: Secondary | ICD-10-CM | POA: Diagnosis not present

## 2016-02-18 DIAGNOSIS — L97911 Non-pressure chronic ulcer of unspecified part of right lower leg limited to breakdown of skin: Secondary | ICD-10-CM | POA: Diagnosis not present

## 2016-02-23 DIAGNOSIS — Z0389 Encounter for observation for other suspected diseases and conditions ruled out: Secondary | ICD-10-CM | POA: Diagnosis not present

## 2016-02-23 DIAGNOSIS — D693 Immune thrombocytopenic purpura: Secondary | ICD-10-CM | POA: Diagnosis not present

## 2016-02-23 DIAGNOSIS — S3690XA Unspecified injury of unspecified intra-abdominal organ, initial encounter: Secondary | ICD-10-CM | POA: Diagnosis not present

## 2016-02-23 DIAGNOSIS — I2510 Atherosclerotic heart disease of native coronary artery without angina pectoris: Secondary | ICD-10-CM | POA: Diagnosis not present

## 2016-02-23 DIAGNOSIS — D696 Thrombocytopenia, unspecified: Secondary | ICD-10-CM | POA: Diagnosis not present

## 2016-02-23 DIAGNOSIS — Z9889 Other specified postprocedural states: Secondary | ICD-10-CM | POA: Diagnosis not present

## 2016-02-23 DIAGNOSIS — R918 Other nonspecific abnormal finding of lung field: Secondary | ICD-10-CM | POA: Diagnosis not present

## 2016-02-23 DIAGNOSIS — Z9109 Other allergy status, other than to drugs and biological substances: Secondary | ICD-10-CM | POA: Diagnosis not present

## 2016-02-23 DIAGNOSIS — C9201 Acute myeloblastic leukemia, in remission: Secondary | ICD-10-CM | POA: Diagnosis not present

## 2016-02-23 DIAGNOSIS — Z794 Long term (current) use of insulin: Secondary | ICD-10-CM | POA: Diagnosis not present

## 2016-02-23 DIAGNOSIS — I10 Essential (primary) hypertension: Secondary | ICD-10-CM | POA: Diagnosis not present

## 2016-02-23 DIAGNOSIS — K219 Gastro-esophageal reflux disease without esophagitis: Secondary | ICD-10-CM | POA: Diagnosis not present

## 2016-02-23 DIAGNOSIS — Z885 Allergy status to narcotic agent status: Secondary | ICD-10-CM | POA: Diagnosis not present

## 2016-02-23 DIAGNOSIS — J45909 Unspecified asthma, uncomplicated: Secondary | ICD-10-CM | POA: Diagnosis not present

## 2016-02-23 DIAGNOSIS — G20 Parkinson's disease: Secondary | ICD-10-CM | POA: Diagnosis not present

## 2016-02-23 DIAGNOSIS — M353 Polymyalgia rheumatica: Secondary | ICD-10-CM | POA: Diagnosis not present

## 2016-02-23 DIAGNOSIS — Z888 Allergy status to other drugs, medicaments and biological substances status: Secondary | ICD-10-CM | POA: Diagnosis not present

## 2016-02-23 DIAGNOSIS — E119 Type 2 diabetes mellitus without complications: Secondary | ICD-10-CM | POA: Diagnosis not present

## 2016-02-23 DIAGNOSIS — S2220XA Unspecified fracture of sternum, initial encounter for closed fracture: Secondary | ICD-10-CM | POA: Diagnosis not present

## 2016-02-23 DIAGNOSIS — Z7952 Long term (current) use of systemic steroids: Secondary | ICD-10-CM | POA: Diagnosis not present

## 2016-02-23 DIAGNOSIS — S22088D Other fracture of T11-T12 vertebra, subsequent encounter for fracture with routine healing: Secondary | ICD-10-CM | POA: Diagnosis not present

## 2016-02-23 DIAGNOSIS — R10819 Abdominal tenderness, unspecified site: Secondary | ICD-10-CM | POA: Diagnosis not present

## 2016-02-23 DIAGNOSIS — E039 Hypothyroidism, unspecified: Secondary | ICD-10-CM | POA: Diagnosis not present

## 2016-02-23 DIAGNOSIS — Z041 Encounter for examination and observation following transport accident: Secondary | ICD-10-CM | POA: Diagnosis not present

## 2016-02-23 DIAGNOSIS — Z881 Allergy status to other antibiotic agents status: Secondary | ICD-10-CM | POA: Diagnosis not present

## 2016-02-24 DIAGNOSIS — R0602 Shortness of breath: Secondary | ICD-10-CM | POA: Diagnosis not present

## 2016-02-24 DIAGNOSIS — S2220XA Unspecified fracture of sternum, initial encounter for closed fracture: Secondary | ICD-10-CM | POA: Diagnosis not present

## 2016-02-25 DIAGNOSIS — I890 Lymphedema, not elsewhere classified: Secondary | ICD-10-CM | POA: Diagnosis not present

## 2016-02-25 DIAGNOSIS — L97911 Non-pressure chronic ulcer of unspecified part of right lower leg limited to breakdown of skin: Secondary | ICD-10-CM | POA: Diagnosis not present

## 2016-02-25 DIAGNOSIS — I872 Venous insufficiency (chronic) (peripheral): Secondary | ICD-10-CM | POA: Diagnosis not present

## 2016-03-03 DIAGNOSIS — E119 Type 2 diabetes mellitus without complications: Secondary | ICD-10-CM | POA: Diagnosis not present

## 2016-03-03 DIAGNOSIS — E039 Hypothyroidism, unspecified: Secondary | ICD-10-CM | POA: Diagnosis not present

## 2016-03-03 DIAGNOSIS — L97911 Non-pressure chronic ulcer of unspecified part of right lower leg limited to breakdown of skin: Secondary | ICD-10-CM | POA: Diagnosis not present

## 2016-03-03 DIAGNOSIS — I2510 Atherosclerotic heart disease of native coronary artery without angina pectoris: Secondary | ICD-10-CM | POA: Diagnosis not present

## 2016-03-03 DIAGNOSIS — I10 Essential (primary) hypertension: Secondary | ICD-10-CM | POA: Diagnosis not present

## 2016-03-03 DIAGNOSIS — C9200 Acute myeloblastic leukemia, not having achieved remission: Secondary | ICD-10-CM | POA: Diagnosis not present

## 2016-03-03 DIAGNOSIS — Z4789 Encounter for other orthopedic aftercare: Secondary | ICD-10-CM | POA: Diagnosis not present

## 2016-03-14 DIAGNOSIS — E119 Type 2 diabetes mellitus without complications: Secondary | ICD-10-CM | POA: Diagnosis not present

## 2016-03-14 DIAGNOSIS — I2510 Atherosclerotic heart disease of native coronary artery without angina pectoris: Secondary | ICD-10-CM | POA: Diagnosis not present

## 2016-03-14 DIAGNOSIS — I890 Lymphedema, not elsewhere classified: Secondary | ICD-10-CM | POA: Diagnosis not present

## 2016-03-14 DIAGNOSIS — Z9221 Personal history of antineoplastic chemotherapy: Secondary | ICD-10-CM | POA: Diagnosis not present

## 2016-03-14 DIAGNOSIS — L97911 Non-pressure chronic ulcer of unspecified part of right lower leg limited to breakdown of skin: Secondary | ICD-10-CM | POA: Diagnosis not present

## 2016-03-14 DIAGNOSIS — C9200 Acute myeloblastic leukemia, not having achieved remission: Secondary | ICD-10-CM | POA: Diagnosis not present

## 2016-03-14 DIAGNOSIS — I10 Essential (primary) hypertension: Secondary | ICD-10-CM | POA: Diagnosis not present

## 2016-03-14 DIAGNOSIS — I872 Venous insufficiency (chronic) (peripheral): Secondary | ICD-10-CM | POA: Diagnosis not present

## 2016-03-14 DIAGNOSIS — E039 Hypothyroidism, unspecified: Secondary | ICD-10-CM | POA: Diagnosis not present

## 2016-03-15 DIAGNOSIS — S2220XD Unspecified fracture of sternum, subsequent encounter for fracture with routine healing: Secondary | ICD-10-CM | POA: Diagnosis not present

## 2016-03-18 DIAGNOSIS — Z794 Long term (current) use of insulin: Secondary | ICD-10-CM | POA: Diagnosis not present

## 2016-03-18 DIAGNOSIS — G4700 Insomnia, unspecified: Secondary | ICD-10-CM | POA: Diagnosis not present

## 2016-03-18 DIAGNOSIS — Z886 Allergy status to analgesic agent status: Secondary | ICD-10-CM | POA: Diagnosis not present

## 2016-03-18 DIAGNOSIS — M353 Polymyalgia rheumatica: Secondary | ICD-10-CM | POA: Diagnosis not present

## 2016-03-18 DIAGNOSIS — Z881 Allergy status to other antibiotic agents status: Secondary | ICD-10-CM | POA: Diagnosis not present

## 2016-03-18 DIAGNOSIS — G20 Parkinson's disease: Secondary | ICD-10-CM | POA: Diagnosis not present

## 2016-03-18 DIAGNOSIS — S81801A Unspecified open wound, right lower leg, initial encounter: Secondary | ICD-10-CM | POA: Diagnosis not present

## 2016-03-18 DIAGNOSIS — E039 Hypothyroidism, unspecified: Secondary | ICD-10-CM | POA: Diagnosis not present

## 2016-03-18 DIAGNOSIS — Z79899 Other long term (current) drug therapy: Secondary | ICD-10-CM | POA: Diagnosis not present

## 2016-03-18 DIAGNOSIS — D696 Thrombocytopenia, unspecified: Secondary | ICD-10-CM | POA: Diagnosis not present

## 2016-03-18 DIAGNOSIS — Z955 Presence of coronary angioplasty implant and graft: Secondary | ICD-10-CM | POA: Diagnosis not present

## 2016-03-18 DIAGNOSIS — E119 Type 2 diabetes mellitus without complications: Secondary | ICD-10-CM | POA: Diagnosis not present

## 2016-03-18 DIAGNOSIS — Z885 Allergy status to narcotic agent status: Secondary | ICD-10-CM | POA: Diagnosis not present

## 2016-03-18 DIAGNOSIS — G253 Myoclonus: Secondary | ICD-10-CM | POA: Diagnosis not present

## 2016-03-18 DIAGNOSIS — J45909 Unspecified asthma, uncomplicated: Secondary | ICD-10-CM | POA: Diagnosis not present

## 2016-03-18 DIAGNOSIS — C9200 Acute myeloblastic leukemia, not having achieved remission: Secondary | ICD-10-CM | POA: Diagnosis not present

## 2016-03-18 DIAGNOSIS — I119 Hypertensive heart disease without heart failure: Secondary | ICD-10-CM | POA: Diagnosis not present

## 2016-03-18 DIAGNOSIS — I2510 Atherosclerotic heart disease of native coronary artery without angina pectoris: Secondary | ICD-10-CM | POA: Diagnosis not present

## 2016-03-18 DIAGNOSIS — Z888 Allergy status to other drugs, medicaments and biological substances status: Secondary | ICD-10-CM | POA: Diagnosis not present

## 2016-03-18 DIAGNOSIS — Z7952 Long term (current) use of systemic steroids: Secondary | ICD-10-CM | POA: Diagnosis not present

## 2016-03-18 DIAGNOSIS — E876 Hypokalemia: Secondary | ICD-10-CM | POA: Diagnosis not present

## 2016-03-18 DIAGNOSIS — R490 Dysphonia: Secondary | ICD-10-CM | POA: Diagnosis not present

## 2016-03-18 DIAGNOSIS — R400 Somnolence: Secondary | ICD-10-CM | POA: Diagnosis not present

## 2016-03-18 DIAGNOSIS — C9201 Acute myeloblastic leukemia, in remission: Secondary | ICD-10-CM | POA: Diagnosis not present

## 2016-03-21 DIAGNOSIS — E11622 Type 2 diabetes mellitus with other skin ulcer: Secondary | ICD-10-CM | POA: Diagnosis not present

## 2016-03-21 DIAGNOSIS — E039 Hypothyroidism, unspecified: Secondary | ICD-10-CM | POA: Diagnosis not present

## 2016-03-21 DIAGNOSIS — I10 Essential (primary) hypertension: Secondary | ICD-10-CM | POA: Diagnosis not present

## 2016-03-21 DIAGNOSIS — L97911 Non-pressure chronic ulcer of unspecified part of right lower leg limited to breakdown of skin: Secondary | ICD-10-CM | POA: Diagnosis not present

## 2016-03-21 DIAGNOSIS — I872 Venous insufficiency (chronic) (peripheral): Secondary | ICD-10-CM | POA: Diagnosis not present

## 2016-03-21 DIAGNOSIS — C9200 Acute myeloblastic leukemia, not having achieved remission: Secondary | ICD-10-CM | POA: Diagnosis not present

## 2016-03-21 DIAGNOSIS — I2510 Atherosclerotic heart disease of native coronary artery without angina pectoris: Secondary | ICD-10-CM | POA: Diagnosis not present

## 2016-03-22 DIAGNOSIS — H02102 Unspecified ectropion of right lower eyelid: Secondary | ICD-10-CM | POA: Diagnosis not present

## 2016-03-22 DIAGNOSIS — H04123 Dry eye syndrome of bilateral lacrimal glands: Secondary | ICD-10-CM | POA: Diagnosis not present

## 2016-03-22 DIAGNOSIS — H02105 Unspecified ectropion of left lower eyelid: Secondary | ICD-10-CM | POA: Diagnosis not present

## 2016-03-25 ENCOUNTER — Telehealth: Payer: Self-pay | Admitting: *Deleted

## 2016-03-25 NOTE — Telephone Encounter (Signed)
Received fax from [facility] with lab orders. Noted in [place] pt planned to have labs done today at [facility]. Left message on voicemail at home number requesting pt call office to schedule labs if needed.

## 2016-03-28 DIAGNOSIS — C9201 Acute myeloblastic leukemia, in remission: Secondary | ICD-10-CM | POA: Diagnosis not present

## 2016-03-28 NOTE — Telephone Encounter (Signed)
Message from pt's wife reporting he is having labs done at [facility]

## 2016-03-30 DIAGNOSIS — H02105 Unspecified ectropion of left lower eyelid: Secondary | ICD-10-CM | POA: Diagnosis not present

## 2016-03-30 DIAGNOSIS — H02102 Unspecified ectropion of right lower eyelid: Secondary | ICD-10-CM | POA: Diagnosis not present

## 2016-03-30 DIAGNOSIS — H04123 Dry eye syndrome of bilateral lacrimal glands: Secondary | ICD-10-CM | POA: Diagnosis not present

## 2016-03-30 DIAGNOSIS — H0012 Chalazion right lower eyelid: Secondary | ICD-10-CM | POA: Diagnosis not present

## 2016-04-01 DIAGNOSIS — G20 Parkinson's disease: Secondary | ICD-10-CM | POA: Diagnosis not present

## 2016-04-01 DIAGNOSIS — C9200 Acute myeloblastic leukemia, not having achieved remission: Secondary | ICD-10-CM | POA: Diagnosis not present

## 2016-04-01 DIAGNOSIS — R400 Somnolence: Secondary | ICD-10-CM | POA: Diagnosis not present

## 2016-04-01 DIAGNOSIS — G253 Myoclonus: Secondary | ICD-10-CM | POA: Diagnosis not present

## 2016-04-01 DIAGNOSIS — R251 Tremor, unspecified: Secondary | ICD-10-CM | POA: Diagnosis not present

## 2016-04-01 DIAGNOSIS — E7220 Disorder of urea cycle metabolism, unspecified: Secondary | ICD-10-CM | POA: Diagnosis not present

## 2016-04-01 DIAGNOSIS — R7989 Other specified abnormal findings of blood chemistry: Secondary | ICD-10-CM | POA: Diagnosis not present

## 2016-04-01 DIAGNOSIS — G248 Other dystonia: Secondary | ICD-10-CM | POA: Diagnosis not present

## 2016-04-08 DIAGNOSIS — E7220 Disorder of urea cycle metabolism, unspecified: Secondary | ICD-10-CM | POA: Diagnosis not present

## 2016-04-08 DIAGNOSIS — N281 Cyst of kidney, acquired: Secondary | ICD-10-CM | POA: Diagnosis not present

## 2016-04-08 DIAGNOSIS — R7989 Other specified abnormal findings of blood chemistry: Secondary | ICD-10-CM | POA: Diagnosis not present

## 2016-04-08 DIAGNOSIS — R162 Hepatomegaly with splenomegaly, not elsewhere classified: Secondary | ICD-10-CM | POA: Diagnosis not present

## 2016-04-14 DIAGNOSIS — I2510 Atherosclerotic heart disease of native coronary artery without angina pectoris: Secondary | ICD-10-CM | POA: Diagnosis not present

## 2016-04-14 DIAGNOSIS — K219 Gastro-esophageal reflux disease without esophagitis: Secondary | ICD-10-CM | POA: Diagnosis not present

## 2016-04-14 DIAGNOSIS — G20 Parkinson's disease: Secondary | ICD-10-CM | POA: Diagnosis not present

## 2016-04-14 DIAGNOSIS — E119 Type 2 diabetes mellitus without complications: Secondary | ICD-10-CM | POA: Diagnosis not present

## 2016-04-14 DIAGNOSIS — E039 Hypothyroidism, unspecified: Secondary | ICD-10-CM | POA: Diagnosis not present

## 2016-04-14 DIAGNOSIS — Z794 Long term (current) use of insulin: Secondary | ICD-10-CM | POA: Diagnosis not present

## 2016-04-15 DIAGNOSIS — R932 Abnormal findings on diagnostic imaging of liver and biliary tract: Secondary | ICD-10-CM | POA: Diagnosis not present

## 2016-04-19 DIAGNOSIS — R799 Abnormal finding of blood chemistry, unspecified: Secondary | ICD-10-CM | POA: Diagnosis not present

## 2016-04-19 DIAGNOSIS — R932 Abnormal findings on diagnostic imaging of liver and biliary tract: Secondary | ICD-10-CM | POA: Diagnosis not present

## 2016-04-28 DIAGNOSIS — R2689 Other abnormalities of gait and mobility: Secondary | ICD-10-CM | POA: Diagnosis not present

## 2016-04-28 DIAGNOSIS — R4782 Fluency disorder in conditions classified elsewhere: Secondary | ICD-10-CM | POA: Diagnosis not present

## 2016-04-28 DIAGNOSIS — Z888 Allergy status to other drugs, medicaments and biological substances status: Secondary | ICD-10-CM | POA: Diagnosis not present

## 2016-04-28 DIAGNOSIS — Z881 Allergy status to other antibiotic agents status: Secondary | ICD-10-CM | POA: Diagnosis not present

## 2016-04-28 DIAGNOSIS — E119 Type 2 diabetes mellitus without complications: Secondary | ICD-10-CM | POA: Diagnosis not present

## 2016-04-28 DIAGNOSIS — R292 Abnormal reflex: Secondary | ICD-10-CM | POA: Diagnosis not present

## 2016-04-28 DIAGNOSIS — I10 Essential (primary) hypertension: Secondary | ICD-10-CM | POA: Diagnosis not present

## 2016-04-28 DIAGNOSIS — G20 Parkinson's disease: Secondary | ICD-10-CM | POA: Diagnosis not present

## 2016-04-28 DIAGNOSIS — I2510 Atherosclerotic heart disease of native coronary artery without angina pectoris: Secondary | ICD-10-CM | POA: Diagnosis not present

## 2016-04-28 DIAGNOSIS — Z886 Allergy status to analgesic agent status: Secondary | ICD-10-CM | POA: Diagnosis not present

## 2016-04-28 DIAGNOSIS — R4789 Other speech disturbances: Secondary | ICD-10-CM | POA: Diagnosis not present

## 2016-04-28 DIAGNOSIS — Z794 Long term (current) use of insulin: Secondary | ICD-10-CM | POA: Diagnosis not present

## 2016-04-28 DIAGNOSIS — R278 Other lack of coordination: Secondary | ICD-10-CM | POA: Diagnosis not present

## 2016-04-28 DIAGNOSIS — E039 Hypothyroidism, unspecified: Secondary | ICD-10-CM | POA: Diagnosis not present

## 2016-04-28 DIAGNOSIS — Z885 Allergy status to narcotic agent status: Secondary | ICD-10-CM | POA: Diagnosis not present

## 2016-04-28 DIAGNOSIS — Z79899 Other long term (current) drug therapy: Secondary | ICD-10-CM | POA: Diagnosis not present

## 2016-04-29 DIAGNOSIS — Z881 Allergy status to other antibiotic agents status: Secondary | ICD-10-CM | POA: Diagnosis not present

## 2016-04-29 DIAGNOSIS — Z955 Presence of coronary angioplasty implant and graft: Secondary | ICD-10-CM | POA: Diagnosis not present

## 2016-04-29 DIAGNOSIS — Z794 Long term (current) use of insulin: Secondary | ICD-10-CM | POA: Diagnosis not present

## 2016-04-29 DIAGNOSIS — I2510 Atherosclerotic heart disease of native coronary artery without angina pectoris: Secondary | ICD-10-CM | POA: Diagnosis not present

## 2016-04-29 DIAGNOSIS — E119 Type 2 diabetes mellitus without complications: Secondary | ICD-10-CM | POA: Diagnosis not present

## 2016-04-29 DIAGNOSIS — I119 Hypertensive heart disease without heart failure: Secondary | ICD-10-CM | POA: Diagnosis not present

## 2016-04-29 DIAGNOSIS — Z885 Allergy status to narcotic agent status: Secondary | ICD-10-CM | POA: Diagnosis not present

## 2016-04-29 DIAGNOSIS — Z79899 Other long term (current) drug therapy: Secondary | ICD-10-CM | POA: Diagnosis not present

## 2016-04-29 DIAGNOSIS — G4700 Insomnia, unspecified: Secondary | ICD-10-CM | POA: Diagnosis not present

## 2016-04-29 DIAGNOSIS — D696 Thrombocytopenia, unspecified: Secondary | ICD-10-CM | POA: Diagnosis not present

## 2016-04-29 DIAGNOSIS — E876 Hypokalemia: Secondary | ICD-10-CM | POA: Diagnosis not present

## 2016-04-29 DIAGNOSIS — J45909 Unspecified asthma, uncomplicated: Secondary | ICD-10-CM | POA: Diagnosis not present

## 2016-04-29 DIAGNOSIS — Z888 Allergy status to other drugs, medicaments and biological substances status: Secondary | ICD-10-CM | POA: Diagnosis not present

## 2016-04-29 DIAGNOSIS — H578 Other specified disorders of eye and adnexa: Secondary | ICD-10-CM | POA: Diagnosis not present

## 2016-04-29 DIAGNOSIS — R251 Tremor, unspecified: Secondary | ICD-10-CM | POA: Diagnosis not present

## 2016-04-29 DIAGNOSIS — C9201 Acute myeloblastic leukemia, in remission: Secondary | ICD-10-CM | POA: Diagnosis not present

## 2016-04-29 DIAGNOSIS — E039 Hypothyroidism, unspecified: Secondary | ICD-10-CM | POA: Diagnosis not present

## 2016-05-06 DIAGNOSIS — G20 Parkinson's disease: Secondary | ICD-10-CM | POA: Diagnosis not present

## 2016-05-12 DIAGNOSIS — I872 Venous insufficiency (chronic) (peripheral): Secondary | ICD-10-CM | POA: Diagnosis not present

## 2016-05-12 DIAGNOSIS — E11622 Type 2 diabetes mellitus with other skin ulcer: Secondary | ICD-10-CM | POA: Diagnosis not present

## 2016-05-12 DIAGNOSIS — E11621 Type 2 diabetes mellitus with foot ulcer: Secondary | ICD-10-CM | POA: Diagnosis not present

## 2016-05-12 DIAGNOSIS — C9200 Acute myeloblastic leukemia, not having achieved remission: Secondary | ICD-10-CM | POA: Diagnosis not present

## 2016-05-12 DIAGNOSIS — L97321 Non-pressure chronic ulcer of left ankle limited to breakdown of skin: Secondary | ICD-10-CM | POA: Diagnosis not present

## 2016-05-12 DIAGNOSIS — E039 Hypothyroidism, unspecified: Secondary | ICD-10-CM | POA: Diagnosis not present

## 2016-05-12 DIAGNOSIS — L97921 Non-pressure chronic ulcer of unspecified part of left lower leg limited to breakdown of skin: Secondary | ICD-10-CM | POA: Diagnosis not present

## 2016-05-12 DIAGNOSIS — Z9889 Other specified postprocedural states: Secondary | ICD-10-CM | POA: Diagnosis not present

## 2016-05-12 DIAGNOSIS — I10 Essential (primary) hypertension: Secondary | ICD-10-CM | POA: Diagnosis not present

## 2016-05-12 DIAGNOSIS — I2510 Atherosclerotic heart disease of native coronary artery without angina pectoris: Secondary | ICD-10-CM | POA: Diagnosis not present

## 2016-05-19 DIAGNOSIS — R292 Abnormal reflex: Secondary | ICD-10-CM | POA: Diagnosis not present

## 2016-05-19 DIAGNOSIS — R4701 Aphasia: Secondary | ICD-10-CM | POA: Diagnosis not present

## 2016-05-19 DIAGNOSIS — G5622 Lesion of ulnar nerve, left upper limb: Secondary | ICD-10-CM | POA: Diagnosis not present

## 2016-05-25 DIAGNOSIS — R471 Dysarthria and anarthria: Secondary | ICD-10-CM | POA: Diagnosis not present

## 2016-06-01 ENCOUNTER — Ambulatory Visit: Payer: Medicare Other | Admitting: Physical Therapy

## 2016-06-07 ENCOUNTER — Ambulatory Visit: Payer: Medicare Other | Attending: Neurology | Admitting: Physical Therapy

## 2016-06-07 DIAGNOSIS — R293 Abnormal posture: Secondary | ICD-10-CM | POA: Insufficient documentation

## 2016-06-07 DIAGNOSIS — R2689 Other abnormalities of gait and mobility: Secondary | ICD-10-CM

## 2016-06-07 DIAGNOSIS — M6281 Muscle weakness (generalized): Secondary | ICD-10-CM | POA: Diagnosis not present

## 2016-06-07 DIAGNOSIS — R2681 Unsteadiness on feet: Secondary | ICD-10-CM | POA: Diagnosis not present

## 2016-06-08 NOTE — Therapy (Signed)
[facility] [address] [place], [place], [address] Phone: [phone]   Fax:  [phone]  Physical Therapy Evaluation  Patient Details  Name: Nathan Kemp MRN: [id number] Date of Birth: [birth year] Referring Provider: Sonia Baller  Encounter Date: 06/07/2016      PT End of Session - 06/08/16 1106    Visit Number 1   Number of Visits 17   Date for PT Re-Evaluation 08/06/16   Authorization Type Medicare Primary; BCBS secondary-GCODE every 10th visit   PT Start Time 1150   PT Stop Time 1250   PT Time Calculation (min) 60 min   Equipment Utilized During Treatment Gait belt   Activity Tolerance Patient tolerated treatment well   Behavior During Therapy [facility] for tasks assessed/performed      Past Medical History:  Diagnosis Date  . Acute GI bleeding 04/16/2011  . Anemia   . Anorexia   . Asthma    when  have congstion  of chest  . Blood dyscrasia 06/2009   thrombocytopenia  . Blood transfusion    platelets 12/12  . 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 Coronary artery disease    stent 06  . Cough   . DDD (degenerative disc disease), cervical   . Diabetes mellitus    borderline-controlled by diet  . ED (erectile dysfunction)   . Essential hypertension   . Fatigue   . GERD (gastroesophageal reflux disease)   . Heart murmur   . Hyperlipidemia   . Hypertension   . Hypothyroidism   . IBS (irritable bowel syndrome)   . Leg swelling    due to lymphedema  . Lymphedema: RLE 02/19/2015  . Obesity   . PMR (polymyalgia rheumatica) ([place]) 02/19/2015  . Pneumonia   . Polymyalgia ([place])   . Thrombocytopenia ([place])   . Thyroid disease   . Trouble swallowing   . Vitamin D deficiency     Past Surgical History:  Procedure Laterality Date  . APPENDECTOMY  1967  . BONE MARROW BIOPSY     2011-at [facility], 01/2010-at [facility], 06/12-[facility]  . CARDIAC CATHETERIZATION  01/31/2005   EF 60-65%  .  cataract Bilateral   . CORONARY ANGIOPLASTY WITH STENT PLACEMENT  2006   STENTING OF HIS CORONARY ARTERY  . EDG Dilitation  [id number]  . 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 LYMPH NODE BIOPSY  03/15/2011   Procedure: LYMPH NODE BIOPSY;  Surgeon: Earnstine Regal, MD;  Location: [facility];  Service: General;  Laterality: Right;  Right Anterior Cervical Lymph Node Excisional Biopsy   . lymph node removal  2011   right groin  . NASAL SINUS SURGERY  2005  . TONSILLECTOMY AND ADENOIDECTOMY    . US ECHOCARDIOGRAPHY  02/28/2005   EF 55-60%    There were no vitals filed for this visit.       Subjective Assessment - 06/07/16 1159    Subjective Wife present to assist with history intake.  She reports difficulty with speech, tremors, quick progression of loss of mobility per wife reports.  Speech difficulty began before Nov. 2016, then tremors began Dec. 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  Wife reports tremors start in RLE and then become whole body tremors, making it difficult for controlling body and for walking.  Pt reports near-fall with RLE tremor coming into garage from home this  morning on the way to PT.  Saw Dr. Hall Busing and she is unsure if it is Parkinson's disease.  He has had 3 falls since December 2016; he uses RW at home and has wheelchair for long distance mobility.  Wife reports not knowing what the diagnosis is is very hard and frustrating; she reports his condition "is like a living hell."   Patient is accompained by: Family member  wife   Pertinent History Leukemia (AML) dx Nov. 2016 (in remission); LUE intention tremor > 5 years; polymyalgia, DM, CAD, dysarthria   Limitations Walking   Patient Stated Goals Pt's goal for therapy is to improve balance and to walk again.   Currently in Pain? No/denies            [facility] PT Assessment - 06/07/16 1216      Assessment   Medical Diagnosis Parkinson's disease-?   Referring  Provider Sonia Baller   Onset Date/Surgical Date 04/28/16  Neurologist consult     Precautions   Precautions Fall  No driving   Precaution Comments Tremor in RLE that becomes whole body tremor, limiting movement and gait, per pt/wife report     Balance Screen   Has the patient fallen in the past 6 months Yes   How many times? 2   Has the patient had a decrease in activity level because of a fear of falling?  Yes   Is the patient reluctant to leave their home because of a fear of falling?  No     Home Ecologist residence   Living Arrangements Spouse/significant other   Available Help at Discharge Family   Type of [place] to enter   Entrance Stairs-Number of Steps 1   [place] Two level;Bed/bath upstairs   [place] - 2 wheels;Wheelchair - manual   Additional Comments Currently trying to sell home in order to move to one level home     Prior Function   Level of Independence Independent with household mobility without device  Independent prior to Nov 2016; used RW 8 weeks   Vocation Retired   Leisure Limited in out of home activities-has not been to church in 2 years; goes out to eat with wife     Observation/Other Assessments   Observations Visual scanning/tracking-pt able to fully track up/down, L and R, no deficits noted.  Observed that pt demo droopy eyelids bilaterally and has preference for closing eyes during session.   Focus on Therapeutic Outcomes (FOTO)  NA     Posture/Postural Control   Posture/Postural Control Postural limitations   Postural Limitations Forward head;Rounded Shoulders;Posterior pelvic tilt     Tone   Assessment Location Left Upper Extremity  No increase in tone noted bilat lower extremities     ROM / Strength   AROM / PROM / Strength Strength     Strength   Overall Strength Deficits   Overall Strength Comments Wife reports generalized  weakness, especially with fatigue, like she is "picking up dead weight"   Strength Assessment Site Hip;Knee;Ankle;Shoulder;Elbow   Right/Left Shoulder Right;Left   Right Shoulder Flexion 4/5   Left Shoulder Flexion 4/5   Right/Left Elbow Right;Left   Right Elbow Flexion 4/5   Right Elbow Extension 4/5   Left Elbow Flexion 4/5   Left Elbow Extension 4/5   Right/Left Hip Right;Left   Right Hip Flexion 4/5   Left Hip Flexion 4/5  Right/Left Knee Right;Left   Right Knee Flexion 4/5   Right Knee Extension 4/5   Left Knee Flexion 3+/5   Left Knee Extension 3+/5   Right/Left Ankle Right;Left   Right Ankle Dorsiflexion 4/5   Left Ankle Dorsiflexion 4/5     Transfers   Transfers Sit to Stand;Stand to Sit   Sit to Stand 5: Supervision;4: Min guard;With upper extremity assist;From chair/3-in-1   Stand to Sit 5: Supervision;4: Min guard;With upper extremity assist;To chair/3-in-1     Ambulation/Gait   Ambulation/Gait Yes   Ambulation/Gait Assistance 4: Min guard   Ambulation Distance (Feet) 45 Feet   Assistive device Rolling walker   Gait Pattern Step-through pattern;Decreased step length - right;Decreased step length - left;Poor foot clearance - left;Poor foot clearance - right   Ambulation Surface Level;Indoor   Gait velocity 21.87 sec = 1.5 ft/sec     Standardized Balance Assessment   Standardized Balance Assessment Timed Up and Go Test     Timed Up and Go Test   Normal TUG (seconds) 41.07   TUG Comments Scores >13.5 sec indicate increased fall risk; >30 seconds indicates difficulty with ADLs in home.  Slight hesitation on RLE noted with turning to sit.     LUE Tone   LUE Tone Mild                           PT Education - 06/08/16 1104    Education provided Yes   Education Details Discussed options for PT-including HHPT vs. Outpatient PT; discussed OP PT POC; also discussed pt's Sinemet-tremors, on-off times of medication, trying to avoid taking Sinemet  with high protein meals   Person(s) Educated Patient;Spouse   Methods Explanation   Comprehension Verbalized understanding          PT Short Term Goals - 06/08/16 1620      PT SHORT TERM GOAL #1   Title Pt will perform HEP with family supervision for improved posture, strength, gait and balance.  TARGET 07/07/16   Time 4   Period Weeks   Status New     PT SHORT TERM GOAL #2   Title Berg Balance score to be assessed, with goal to be written as appropriate.   Time 4   Period Weeks   Status New     PT SHORT TERM GOAL #3   Title Pt will improve TUG score to less than or equal to 30 seconds for decreased fall risk.   Time 4   Period Weeks   Status New     PT SHORT TERM GOAL #4   Title Pt will perform at least 8 of 10 reps of sit<>stand transfers with minimal UE support, modified independently for improved safety and efficiency with transfers.   Time 4   Period Weeks   Status New     PT SHORT TERM GOAL #5   Title Pt will ambulate at least 200 ft, using RW, with min assistance, for improved safety and efficiency with gait.   Time 4   Period Weeks   Status New     Additional Short Term Goals   Additional Short Term Goals Yes     PT SHORT TERM GOAL #6   Title Pt will negotiate one step no handrail, with min assistance, for safe negotiation of step into and out of home.   Time 4   Period Weeks   Status New  PT Long Term Goals - 06/08/16 1627      PT LONG TERM GOAL #1   Title Pt and wife will verbalize understanding of fall prevention in the home environment.  TARGT 08/06/16   Time 8   Period Weeks   Status New     PT LONG TERM GOAL #2   Title Pt will improve TUG score to less than or equal to 20 seconds for decreased fall risk.   Time 8   Period Weeks   Status New     PT LONG TERM GOAL #3   Title Pt will improve gait velocity to at least 2 ft/sec for decreased fall risk and improved gait efficiency and safety.   Time 8   Period Weeks   Status New      PT LONG TERM GOAL #4   Title Pt will improve Berg Balance score by at least 10 points (from eval) for decreased fall risk.   Time 8   Period Weeks   Status New     PT LONG TERM GOAL #5   Title Pt will ambulate at least 500 ft using RW, with supervision, for improved gait efficiency and safety.   Time 8   Period Weeks   Status New               Plan - 06/08/16 1609    Clinical Impression Statement Pt is a 76 year old male who presents to OP PT with diagnosis of possible Parkinson's disease, with patient awaiting further testing and additional neurologist visit.  Pt has PMH including AML (in remission), dysarthria, thrombocytopenia, DM, CAD, polymyalgia.  Pt has noted progression of decreased mobility, RLE tremors, weakness, and 2 falls.  Pt presents with slowed gait, decreased balance, decreased independence with gait and transfers, decreased timing and coordination of gait (with RLE hesitation noted with turn), decreased lower extremity strength.  Pt also noted to have bilateral droopiness of eyes and keeps eyes closed much of subjective portion of PT eval.  Pt at fall risk per TUG and gait velocity scores.  Pt's mobility and participation in ADLs, household and community activities is limited.  Pt will benefit from skilled PT to address the above stated deficits for improved functional mobility and decreased fall risk.   Rehab Potential Good   PT Frequency 2x / week   PT Duration 8 weeks  plus eval   PT Treatment/Interventions ADLs/Self Care Home Management;Functional mobility training;Gait training;Therapeutic activities;Therapeutic exercise;Balance training;Neuromuscular re-education;Patient/family education   PT Next Visit Plan Perform Berg Balance test and write goal as appropriate; Initiate HEP for lower extremity strengthening, posture, transfers; gait training with RW   Consulted and Agree with Plan of Care Patient;Family member/caregiver   Family Member Consulted wife       Patient will benefit from skilled therapeutic intervention in order to improve the following deficits and impairments:  Abnormal gait, Decreased activity tolerance, Decreased balance, Decreased mobility, Decreased strength, Difficulty walking, Impaired tone, Postural dysfunction  Visit Diagnosis: Other abnormalities of gait and mobility  Unsteadiness on feet  Muscle weakness (generalized)  Abnormal posture      G-Codes - 2016-06-20 1646    Functional Assessment Tool Used (Outpatient Only) TUG 41.07 sec with RW, gait velocity 1.5 ft/sec with RW, 2 falls in past 6 months   Functional Limitation Mobility: Walking and moving around   Mobility: Walking and Moving Around Current Status ([id number]) At least 60 percent but less than 80 percent impaired, limited or restricted  Mobility: Walking and Moving Around Goal Status [phone]) At least 20 percent but less than 40 percent impaired, limited or restricted       Problem List Patient Active Problem List   Diagnosis Date Noted  . AML (acute myelogenous leukemia) ([place]) 02/20/2015  . Leukocytosis 02/19/2015  . Hepatosplenomegaly 02/19/2015  . Lymphedema: RLE 02/19/2015  . PMR (polymyalgia rheumatica) (HCC) 02/19/2015  . Cerebral thrombosis with cerebral infarction ([place]) 10/29/2014  . GERD (gastroesophageal reflux disease) 10/28/2014  . Diabetes mellitus without complication ([place]) 10/16/1599  . History of GI bleed 10/28/2014  . Vertigo 10/28/2014  . Diarrhea 10/28/2014  . Essential hypertension   . Stroke with cerebral ischemia ([place])   . Acute GI bleeding 04/16/2011  . Skin cancer 04/15/2011  . Thrombocytopenia ([place]) 03/28/2011  . Lymphadenopathy, neck 03/08/2011  . CAD (coronary artery disease) 01/13/2011  . Hypothyroidism 03/29/2010  . SPLENOMEGALY 03/29/2010  . HEPATOMEGALY, HX OF 03/29/2010  . FEVER, HX OF 03/29/2010  . HLD (hyperlipidemia) 09/07/2007  . HYPERTENSION 09/07/2007  . COUGH 09/07/2007    Marifer Hurd  W. 06/08/2016, 4:52 PM  Frazier Butt., PT  [facility] [address] [place], [place], [address] Phone: [phone]   Fax:  [phone]  Name: Nathan Kemp MRN: [id number] Date of Birth: 1940/12/26

## 2016-06-10 DIAGNOSIS — R251 Tremor, unspecified: Secondary | ICD-10-CM | POA: Diagnosis not present

## 2016-06-10 DIAGNOSIS — Z8639 Personal history of other endocrine, nutritional and metabolic disease: Secondary | ICD-10-CM | POA: Diagnosis not present

## 2016-06-10 DIAGNOSIS — I10 Essential (primary) hypertension: Secondary | ICD-10-CM | POA: Diagnosis not present

## 2016-06-10 DIAGNOSIS — S81801A Unspecified open wound, right lower leg, initial encounter: Secondary | ICD-10-CM | POA: Diagnosis not present

## 2016-06-10 DIAGNOSIS — E119 Type 2 diabetes mellitus without complications: Secondary | ICD-10-CM | POA: Diagnosis not present

## 2016-06-10 DIAGNOSIS — J45909 Unspecified asthma, uncomplicated: Secondary | ICD-10-CM | POA: Diagnosis not present

## 2016-06-10 DIAGNOSIS — Z885 Allergy status to narcotic agent status: Secondary | ICD-10-CM | POA: Diagnosis not present

## 2016-06-10 DIAGNOSIS — E039 Hypothyroidism, unspecified: Secondary | ICD-10-CM | POA: Diagnosis not present

## 2016-06-10 DIAGNOSIS — Z955 Presence of coronary angioplasty implant and graft: Secondary | ICD-10-CM | POA: Diagnosis not present

## 2016-06-10 DIAGNOSIS — C9200 Acute myeloblastic leukemia, not having achieved remission: Secondary | ICD-10-CM | POA: Diagnosis not present

## 2016-06-10 DIAGNOSIS — I517 Cardiomegaly: Secondary | ICD-10-CM | POA: Diagnosis not present

## 2016-06-10 DIAGNOSIS — Z794 Long term (current) use of insulin: Secondary | ICD-10-CM | POA: Diagnosis not present

## 2016-06-10 DIAGNOSIS — D696 Thrombocytopenia, unspecified: Secondary | ICD-10-CM | POA: Diagnosis not present

## 2016-06-10 DIAGNOSIS — H578 Other specified disorders of eye and adnexa: Secondary | ICD-10-CM | POA: Diagnosis not present

## 2016-06-10 DIAGNOSIS — R4701 Aphasia: Secondary | ICD-10-CM | POA: Diagnosis not present

## 2016-06-10 DIAGNOSIS — Z886 Allergy status to analgesic agent status: Secondary | ICD-10-CM | POA: Diagnosis not present

## 2016-06-10 DIAGNOSIS — Z881 Allergy status to other antibiotic agents status: Secondary | ICD-10-CM | POA: Diagnosis not present

## 2016-06-10 DIAGNOSIS — I2510 Atherosclerotic heart disease of native coronary artery without angina pectoris: Secondary | ICD-10-CM | POA: Diagnosis not present

## 2016-06-10 DIAGNOSIS — H0289 Other specified disorders of eyelid: Secondary | ICD-10-CM | POA: Diagnosis not present

## 2016-06-10 DIAGNOSIS — G4700 Insomnia, unspecified: Secondary | ICD-10-CM | POA: Diagnosis not present

## 2016-06-10 DIAGNOSIS — C9201 Acute myeloblastic leukemia, in remission: Secondary | ICD-10-CM | POA: Diagnosis not present

## 2016-06-10 DIAGNOSIS — E876 Hypokalemia: Secondary | ICD-10-CM | POA: Diagnosis not present

## 2016-06-10 DIAGNOSIS — Z8739 Personal history of other diseases of the musculoskeletal system and connective tissue: Secondary | ICD-10-CM | POA: Diagnosis not present

## 2016-06-10 DIAGNOSIS — K5900 Constipation, unspecified: Secondary | ICD-10-CM | POA: Diagnosis not present

## 2016-06-10 DIAGNOSIS — Z79899 Other long term (current) drug therapy: Secondary | ICD-10-CM | POA: Diagnosis not present

## 2016-06-10 DIAGNOSIS — Z8719 Personal history of other diseases of the digestive system: Secondary | ICD-10-CM | POA: Diagnosis not present

## 2016-06-15 ENCOUNTER — Encounter: Payer: Self-pay | Admitting: Physical Therapy

## 2016-06-15 ENCOUNTER — Ambulatory Visit: Payer: Medicare Other | Admitting: Physical Therapy

## 2016-06-15 DIAGNOSIS — R2681 Unsteadiness on feet: Secondary | ICD-10-CM

## 2016-06-15 DIAGNOSIS — R293 Abnormal posture: Secondary | ICD-10-CM

## 2016-06-15 DIAGNOSIS — R2689 Other abnormalities of gait and mobility: Secondary | ICD-10-CM | POA: Diagnosis not present

## 2016-06-15 DIAGNOSIS — M6281 Muscle weakness (generalized): Secondary | ICD-10-CM | POA: Diagnosis not present

## 2016-06-15 NOTE — Therapy (Signed)
[facility] [address] [place], [place], [address] Phone: [phone]   Fax:  [phone]  Physical Therapy Treatment  Patient Details  Name: Nathan Kemp MRN: [id number] Date of Birth: 1940-08-21 Referring Provider: Sonia Baller  Encounter Date: 06/15/2016      PT End of Session - 06/15/16 1504    Visit Number 2   Number of Visits 17   Date for PT Re-Evaluation 08/06/16   Authorization Type Medicare Primary; BCBS secondary-GCODE every 10th visit   PT Start Time 0808  start delayed due to PT left for sick child and once aware, Nathan Kemp volunteered to treat pt   PT Stop Time 0845   PT Time Calculation (min) 37 min   Equipment Utilized During Treatment Gait belt   Activity Tolerance Patient tolerated treatment well   Behavior During Therapy [facility] for tasks assessed/performed      Past Medical History:  Diagnosis Date  . Acute GI bleeding 04/16/2011  . Anemia   . Anorexia   . Asthma    when  have congstion  of chest  . Blood dyscrasia 06/2009   thrombocytopenia  . Blood transfusion    platelets 12/12  . 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 Coronary artery disease    stent 06  . Cough   . DDD (degenerative disc disease), cervical   . Diabetes mellitus    borderline-controlled by diet  . ED (erectile dysfunction)   . Essential hypertension   . Fatigue   . GERD (gastroesophageal reflux disease)   . Heart murmur   . Hyperlipidemia   . Hypertension   . Hypothyroidism   . IBS (irritable bowel syndrome)   . Leg swelling    due to lymphedema  . Lymphedema: RLE 02/19/2015  . Obesity   . PMR (polymyalgia rheumatica) ([place]) 02/19/2015  . Pneumonia   . Polymyalgia ([place])   . Thrombocytopenia ([place])   . Thyroid disease   . Trouble swallowing   . Vitamin D deficiency     Past Surgical History:  Procedure Laterality Date  . APPENDECTOMY  1967  . BONE MARROW BIOPSY     2011-at [facility],  01/2010-at [facility], 06/12-[facility]  . CARDIAC CATHETERIZATION  01/31/2005   EF 60-65%  . cataract Bilateral   . CORONARY ANGIOPLASTY WITH STENT PLACEMENT  2006   STENTING OF HIS CORONARY ARTERY  . EDG Dilitation  [id number]  . 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 LYMPH NODE BIOPSY  03/15/2011   Procedure: LYMPH NODE BIOPSY;  Surgeon: Earnstine Regal, MD;  Location: [facility]S;  Service: General;  Laterality: Right;  Right Anterior Cervical Lymph Node Excisional Biopsy   . lymph node removal  2011   right groin  . NASAL SINUS SURGERY  2005  . TONSILLECTOMY AND ADENOIDECTOMY    . US ECHOCARDIOGRAPHY  02/28/2005   EF 55-60%    There were no vitals filed for this visit.      Subjective Assessment - 06/15/16 1446    Subjective Reports worst stiffness in the morning and not sure what he'll be able to do today (appt at 8:00).    Patient is accompained by: Family member  wife   Currently in Pain? No/denies  [facility] Adult PT Treatment/Exercise - 06/15/16 0835      Transfers   Transfers Sit to Stand;Stand to Sit   Sit to Stand 4: Min guard   Sit to Stand Details (indicate cue type and reason) can stand from mat table without use of UE's; consistently uses UEs to control descent for safety; demonstrates good safety awareness    Stand to Sit 5: Supervision   Number of Reps 10 reps  throughout session     Ambulation/Gait   Ambulation/Gait Assistance 4: Min guard   Ambulation Distance (Feet) 25 Feet  x3   Assistive device Rolling walker   Gait Pattern Step-through pattern;Decreased step length - right;Decreased step length - left;Poor foot clearance - left;Poor foot clearance - right  occasional tremor with RLE in stance   Ambulation Surface Level;Indoor     Posture/Postural Control   Posture/Postural Control Postural limitations   Postural Limitations Forward head;Rounded  Shoulders;Posterior pelvic tilt   Posture Comments with "sit tall" pt pulls shoulders posteriorly beyond neutral with decr awareness of shoulders behind his pelvis; able to correct to midline posture (including chin tuck) with facilitation and cues x5 reps     Standardized Balance Assessment   Standardized Balance Assessment Berg Balance Test     Berg Balance Test   Sit to Stand Able to stand without using hands and stabilize independently   Standing Unsupported Able to stand safely 2 minutes   Sitting with Back Unsupported but Feet Supported on Floor or Stool Able to sit safely and securely 2 minutes   Stand to Sit Sits safely with minimal use of hands   Transfers Able to transfer with verbal cueing and /or supervision   Standing Unsupported with Eyes Closed Able to stand 10 seconds safely   Standing Ubsupported with Feet Together Needs help to attain position but able to stand for 30 seconds with feet together   From Standing, Reach Forward with Outstretched Arm Reaches forward but needs supervision   From Standing Position, Pick up Object from Floor Able to pick up shoe, needs supervision   From Standing Position, Turn to Look Behind Over each Shoulder Needs supervision when turning   Turn 360 Degrees Needs close supervision or verbal cueing   Standing Unsupported, Alternately Place Feet on Step/Stool Needs assistance to keep from falling or unable to try   Standing Unsupported, One Foot in Front Able to take small step independently and hold 30 seconds   Standing on One Leg Tries to lift leg/unable to hold 3 seconds but remains standing independently   Total Score 32           PWR ([facility]) - 06/15/16 1500    PWR! exercises Moves in sitting   PWR! Up x 10   PWR! Rock x20 upper body only   PWR! Twist x20             PT Education - 06/15/16 1502    Education provided Yes   Education Details Posture; PWR! sitting (up, twist, shift) encouraged to do early in morning routine  (but take meds first so they'll be "coming on board")   Person(s) Educated Patient;Spouse   Methods Explanation;Demonstration;Verbal cues;Handout;Tactile cues   Comprehension Verbalized understanding;Returned demonstration;Verbal cues required;Tactile cues required;Need further instruction          PT Short Term Goals - 06/15/16 1529      PT SHORT TERM GOAL #1   Title Pt will perform HEP with family supervision for improved posture, strength, gait and  balance.  TARGET 07/07/16   Time 4   Period Weeks   Status New     PT SHORT TERM GOAL #2   Title Berg Balance score to be assessed, with goal to be written as appropriate. 2/28 score 32/56; Goal to increase to 37/56 to demonstrate improved balance/safety.    Time 4   Period Weeks   Status On-going     PT SHORT TERM GOAL #3   Title Pt will improve TUG score to less than or equal to 30 seconds for decreased fall risk.   Time 4   Period Weeks   Status New     PT SHORT TERM GOAL #4   Title Pt will perform at least 8 of 10 reps of sit<>stand transfers with minimal UE support, modified independently for improved safety and efficiency with transfers.   Time 4   Period Weeks   Status New     PT SHORT TERM GOAL #5   Title Pt will ambulate at least 200 ft, using RW, with min assistance, for improved safety and efficiency with gait.   Time 4   Period Weeks   Status New     PT SHORT TERM GOAL #6   Title Pt will negotiate one step no handrail, with min assistance, for safe negotiation of step into and out of home.   Time 4   Period Weeks   Status New           PT Long Term Goals - 06/08/16 1627      PT LONG TERM GOAL #1   Title Pt and wife will verbalize understanding of fall prevention in the home environment.  TARGT 08/06/16   Time 8   Period Weeks   Status New     PT LONG TERM GOAL #2   Title Pt will improve TUG score to less than or equal to 20 seconds for decreased fall risk.   Time 8   Period Weeks   Status New      PT LONG TERM GOAL #3   Title Pt will improve gait velocity to at least 2 ft/sec for decreased fall risk and improved gait efficiency and safety.   Time 8   Period Weeks   Status New     PT LONG TERM GOAL #4   Title Pt will improve Berg Balance score by at least 10 points (from eval) for decreased fall risk.   Time 8   Period Weeks   Status New     PT LONG TERM GOAL #5   Title Pt will ambulate at least 500 ft using RW, with supervision, for improved gait efficiency and safety.   Time 8   Period Weeks   Status New               Plan - 06/15/16 1506    Clinical Impression Statement Nathan Kemp demonstrated excellent safety awareness during session and a willingness to try any task asked of him. (Especially related to [facility] Assessment). Pt scored 32/56 on Berg with loss of points frequently due to need for assist to attain position. Initiated patient's HEP with 3 of 4 seated PWR! exercises. Noted to fatigue quickly with ambulation and maintained shorter distances during session. Anticipate will benefit from further PT to work towards goals.   Rehab Potential Good   PT Frequency 2x / week   PT Duration 8 weeks  plus eval   PT Treatment/Interventions ADLs/Self Care Home Management;Functional mobility training;Gait training;Therapeutic activities;Therapeutic  exercise;Balance training;Neuromuscular re-education;Patient/family education   PT Next Visit Plan  HEP for lower extremity strengthening, posture, transfers; gait training with RW   PT Home Exercise Plan sitting PWR (not step, modified rock)   Consulted and Agree with Plan of Care Patient;Family member/caregiver   Family Member Consulted wife      Patient will benefit from skilled therapeutic intervention in order to improve the following deficits and impairments:  Abnormal gait, Decreased activity tolerance, Decreased balance, Decreased mobility, Decreased strength, Difficulty walking, Impaired tone, Postural  dysfunction  Visit Diagnosis: Other abnormalities of gait and mobility  Unsteadiness on feet  Muscle weakness (generalized)  Abnormal posture     Problem List Patient Active Problem List   Diagnosis Date Noted  . AML (acute myelogenous leukemia) ([place]) 02/20/2015  . Leukocytosis 02/19/2015  . Hepatosplenomegaly 02/19/2015  . Lymphedema: RLE 02/19/2015  . PMR (polymyalgia rheumatica) (HCC) 02/19/2015  . Cerebral thrombosis with cerebral infarction ([place]) 10/29/2014  . GERD (gastroesophageal reflux disease) 10/28/2014  . Diabetes mellitus without complication ([place]) 91/91/6606  . History of GI bleed 10/28/2014  . Vertigo 10/28/2014  . Diarrhea 10/28/2014  . Essential hypertension   . Stroke with cerebral ischemia ([place])   . Acute GI bleeding 04/16/2011  . Skin cancer 04/15/2011  . Thrombocytopenia ([place]) 03/28/2011  . Lymphadenopathy, neck 03/08/2011  . CAD (coronary artery disease) 01/13/2011  . Hypothyroidism 03/29/2010  . SPLENOMEGALY 03/29/2010  . HEPATOMEGALY, HX OF 03/29/2010  . FEVER, HX OF 03/29/2010  . HLD (hyperlipidemia) 09/07/2007  . HYPERTENSION 09/07/2007  . COUGH 09/07/2007    Rexanne Mano, PT 06/15/2016, 3:34 PM  [facility] [address] [place], [place], [address] Phone: [phone]   Fax:  [phone]  Name: Nathan Kemp MRN: [id number] Date of Birth: Jul 26, 1940

## 2016-06-17 ENCOUNTER — Ambulatory Visit: Payer: Medicare Other | Attending: Neurology | Admitting: Physical Therapy

## 2016-06-17 DIAGNOSIS — M6281 Muscle weakness (generalized): Secondary | ICD-10-CM | POA: Insufficient documentation

## 2016-06-17 DIAGNOSIS — R2689 Other abnormalities of gait and mobility: Secondary | ICD-10-CM | POA: Insufficient documentation

## 2016-06-17 DIAGNOSIS — R2681 Unsteadiness on feet: Secondary | ICD-10-CM

## 2016-06-17 DIAGNOSIS — R293 Abnormal posture: Secondary | ICD-10-CM | POA: Diagnosis not present

## 2016-06-17 NOTE — Therapy (Signed)
[facility] [address] [place], [place], [address] Phone: [phone]   Fax:  [phone]  Physical Therapy Treatment  Patient Details  Name: Nathan Kemp MRN: [id number] Date of Birth: [birth year] Referring Provider: Sonia Baller  Encounter Date: 06/17/2016      PT End of Session - 06/17/16 1058    Visit Number 3   Number of Visits 17   Date for PT Re-Evaluation 08/06/16   Authorization Type Medicare Primary; BCBS secondary-GCODE every 10th visit   PT Start Time 0804   PT Stop Time 0847   PT Time Calculation (min) 43 min   Equipment Utilized During Treatment Gait belt   Activity Tolerance Patient tolerated treatment well   Behavior During Therapy [facility] for tasks assessed/performed      Past Medical History:  Diagnosis Date  . Acute GI bleeding 04/16/2011  . Anemia   . Anorexia   . Asthma    when  have congstion  of chest  . Blood dyscrasia 06/2009   thrombocytopenia  . Blood transfusion    platelets 12/12  . 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 Coronary artery disease    stent 06  . Cough   . DDD (degenerative disc disease), cervical   . Diabetes mellitus    borderline-controlled by diet  . ED (erectile dysfunction)   . Essential hypertension   . Fatigue   . GERD (gastroesophageal reflux disease)   . Heart murmur   . Hyperlipidemia   . Hypertension   . Hypothyroidism   . IBS (irritable bowel syndrome)   . Leg swelling    due to lymphedema  . Lymphedema: RLE 02/19/2015  . Obesity   . PMR (polymyalgia rheumatica) ([place]) 02/19/2015  . Pneumonia   . Polymyalgia ([place])   . Thrombocytopenia ([place])   . Thyroid disease   . Trouble swallowing   . Vitamin D deficiency     Past Surgical History:  Procedure Laterality Date  . APPENDECTOMY  1967  . BONE MARROW BIOPSY     2011-at [facility], 01/2010-at [facility], [facility]/12-[facility]  . CARDIAC CATHETERIZATION  01/31/2005   EF 60-65%  .  cataract Bilateral   . CORONARY ANGIOPLASTY WITH STENT PLACEMENT  2006   STENTING OF HIS CORONARY ARTERY  . EDG Dilitation  [id number]  . 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 LYMPH NODE BIOPSY  03/15/2011   Procedure: LYMPH NODE BIOPSY;  Surgeon: Earnstine Regal, MD;  Location: [facility];  Service: General;  Laterality: Right;  Right Anterior Cervical Lymph Node Excisional Biopsy   . lymph node removal  2011   right groin  . NASAL SINUS SURGERY  2005  . TONSILLECTOMY AND ADENOIDECTOMY    . US ECHOCARDIOGRAPHY  02/28/2005   EF 55-60%    There were no vitals filed for this visit.      Subjective Assessment - 06/17/16 0807    Subjective Little sore from exercises last visit.   Patient is accompained by: Family member  wife   Pertinent History Leukemia (AML) dx Nov. 2016 (in remission); LUE intention tremor > 5 years; polymyalgia, DM, CAD, dysarthria   Patient Stated Goals Pt's goal for therapy is to improve balance and to walk again.   Currently in Pain? No/denies  [facility] Adult PT Treatment/Exercise - 06/17/16 0001      Transfers   Transfers Sit to Stand;Stand to Sit   Sit to Stand 4: Min guard;From elevated surface;Without upper extremity assist   Stand to Sit 5: Supervision;With upper extremity assist;To elevated surface   Number of Reps 10 reps  from elevated mat height (to simulate twin bed at home)   Transfer Cueing Cues provided for transfer technique, including forward scoot, forward lean, upright posture upon standing   Comments Stand pivot transfer wheelchair>mat with min guard assistance     Ambulation/Gait   Ambulation/Gait Yes   Ambulation/Gait Assistance 4: Min assist   Ambulation/Gait Assistance Details Initiated gait x 3 ft, with pt picking up RW each step, with increased RLE tremors.   Ambulation Distance (Feet) 192 Feet   Assistive device Rolling walker   Gait Pattern  Step-through pattern;Decreased step length - right;Decreased step length - left;Decreased weight shift to right  Improved step length and foot clearance with cues   Ambulation Surface Level;Indoor   Pre-Gait Activities Standing at walker, with lateral weightshifting x 10 reps.  When pt shifts weight fully to RLE, pt noted to have decreased tremor on RLE.  Provided tennis balls to patient and wife, as they report they have skis on his RW at home.  Pt typically picks it up (like a SW) because he is worried about scarring the floors.  Educated pt on proper use of RW for improved ease of gait.   Gait Comments Pt noted to have occasional RLE tremors in stance (with decreased weightshift to RLE) and with negotiating curves in gym area.  PT provides tactil cues at hips for improved lateral weightshifting and verbal cues for improved step length/foot clearance.            PWR [facility]) - 06/17/16 8882    PWR! exercises Moves in sitting   PWR! Up x 20   PWR! Rock x 20   PWR! Twist x 20   PWR! Step x 20  added to HEP   Comments Review of PWR! Moves in sitting:  Verbal, visual, manual cues for technique, intesnity of movement     Discussed relationship of each PWR! Move to functional mobility tasks throughout pt's daily activities.        PT Education - 06/17/16 1057    Education provided Yes   Education Details Addition to HEP:  PWR! Step in sitting; sit<>stand transfers, repeated from bed height   Person(s) Educated Patient;Spouse   Methods Demonstration;Explanation;Handout   Comprehension Verbalized understanding;Returned demonstration;Verbal cues required;Need further instruction          PT Short Term Goals - 06/15/16 1529      PT SHORT TERM GOAL #1   Title Pt will perform HEP with family supervision for improved posture, strength, gait and balance.  TARGET 07/07/16   Time 4   Period Weeks   Status New     PT SHORT TERM GOAL #2   Title Berg Balance score to be assessed, with goal  to be written as appropriate. 2/28 score 32/56; Goal to increase to 37/56 to demonstrate improved balance/safety.    Time 4   Period Weeks   Status On-going     PT SHORT TERM GOAL #3   Title Pt will improve TUG score to less than or equal to 30 seconds for decreased fall risk.   Time 4   Period Weeks   Status New     PT SHORT TERM GOAL #  4   Title Pt will perform at least 8 of 10 reps of sit<>stand transfers with minimal UE support, modified independently for improved safety and efficiency with transfers.   Time 4   Period Weeks   Status New     PT SHORT TERM GOAL #5   Title Pt will ambulate at least 200 ft, using RW, with min assistance, for improved safety and efficiency with gait.   Time 4   Period Weeks   Status New     PT SHORT TERM GOAL #6   Title Pt will negotiate one step no handrail, with min assistance, for safe negotiation of step into and out of home.   Time 4   Period Weeks   Status New           PT Long Term Goals - 06/08/16 1627      PT LONG TERM GOAL #1   Title Pt and wife will verbalize understanding of fall prevention in the home environment.  TARGT 08/06/16   Time 8   Period Weeks   Status New     PT LONG TERM GOAL #2   Title Pt will improve TUG score to less than or equal to 20 seconds for decreased fall risk.   Time 8   Period Weeks   Status New     PT LONG TERM GOAL #3   Title Pt will improve gait velocity to at least 2 ft/sec for decreased fall risk and improved gait efficiency and safety.   Time 8   Period Weeks   Status New     PT LONG TERM GOAL #4   Title Pt will improve Berg Balance score by at least 10 points (from eval) for decreased fall risk.   Time 8   Period Weeks   Status New     PT LONG TERM GOAL #5   Title Pt will ambulate at least 500 ft using RW, with supervision, for improved gait efficiency and safety.   Time 8   Period Weeks   Status New               Plan - 06/17/16 1058    Clinical Impression  Statement Focused skilled PT session this visit on review of and addition to PWR! Moves sitting exercises to address posture, weightshifting, trunk rotation, and transition stepping (as in getting in/out of car), transfer training and gait training.  Pt appears to have RLE tremors, worse when pt does not have full weight shifted onto RLE.  Decreased tremors noted with gait and standing with improved weigthshifting on RLE, with pt able to ambulate longer distance gait today.  Pt will continue to benefit from further skileld PT to work towards goals.   Rehab Potential Good   PT Frequency 2x / week   PT Duration 8 weeks  plus eval   PT Treatment/Interventions ADLs/Self Care Home Management;Functional mobility training;Gait training;Therapeutic activities;Therapeutic exercise;Balance training;Neuromuscular re-education;Patient/family education   PT Next Visit Plan Review sit<>stand, PWR! Moves in sitting; work on standing rocking/weightshifting, lower extremity functional strengthening and gait training   PT [place]! Moves in sitting, repeated sit<>stand transfers   Consulted and Agree with Plan of Care Patient;Family member/caregiver   Family Member Consulted wife      Patient will benefit from skilled therapeutic intervention in order to improve the following deficits and impairments:  Abnormal gait, Decreased activity tolerance, Decreased balance, Decreased mobility, Decreased strength, Difficulty walking, Impaired tone, Postural dysfunction  Visit Diagnosis:  Abnormal posture  Unsteadiness on feet  Other abnormalities of gait and mobility     Problem List Patient Active Problem List   Diagnosis Date Noted  . AML (acute myelogenous leukemia) ([place]) 02/20/2015  . Leukocytosis 02/19/2015  . Hepatosplenomegaly 02/19/2015  . Lymphedema: RLE 02/19/2015  . PMR (polymyalgia rheumatica) ([facility]) 02/19/2015  . Cerebral thrombosis with cerebral infarction ([place]) 10/29/2014  . GERD  (gastroesophageal reflux disease) 10/28/2014  . Diabetes mellitus without complication ([place]) 15/40/0867  . History of GI bleed 10/28/2014  . Vertigo 10/28/2014  . Diarrhea 10/28/2014  . Essential hypertension   . Stroke with cerebral ischemia ([place])   . Acute GI bleeding 04/16/2011  . Skin cancer 04/15/2011  . Thrombocytopenia ([place]) 03/28/2011  . Lymphadenopathy, neck 03/08/2011  . CAD (coronary artery disease) 01/13/2011  . Hypothyroidism 03/29/2010  . SPLENOMEGALY 03/29/2010  . HEPATOMEGALY, HX OF 03/29/2010  . FEVER, HX OF 03/29/2010  . HLD (hyperlipidemia) 09/07/2007  . HYPERTENSION 09/07/2007  . COUGH 09/07/2007    Floretta Petro W. 06/17/2016, 11:03 AM Frazier Butt., PT [facility] [address] [place], [place], [address] Phone: [phone]   Fax:  [phone]  Name: ILIAN WESSELL MRN: [id number] Date of Birth: Dec 12, 1940

## 2016-06-17 NOTE — Patient Instructions (Signed)
Sit to Stand Transfers:  1. Scoot out to the edge of the chair 2. Place your feet flat on the floor, shoulder width apart.  Make sure your feet are tucked just under your knees. 3. Lean forward (nose over toes) with momentum, and stand up tall with your best posture.  If you need to use your arms, use them as a quick boost up to stand. 4. If you are in a low or soft chair, you can lean back and then forward up to stand, in order to get more momentum. 5. Once you are standing, make sure you are looking ahead and standing tall.  To sit down:  1. Back up until you feel the chair behind your legs. 2. Bend at you hips, reaching  Back for you chair, if needed, then slowly squat to sit down on your chair.  REPEAT THIS SIT<>STAND ACTIVITY FROM YOUR TWIN BED OR FROM A STURDY CHAIR, 10 REPS, 1-2 TIMES PER DAY.

## 2016-06-22 ENCOUNTER — Ambulatory Visit: Payer: Medicare Other | Admitting: Physical Therapy

## 2016-06-22 DIAGNOSIS — R293 Abnormal posture: Secondary | ICD-10-CM

## 2016-06-22 DIAGNOSIS — R2681 Unsteadiness on feet: Secondary | ICD-10-CM | POA: Diagnosis not present

## 2016-06-22 DIAGNOSIS — R2689 Other abnormalities of gait and mobility: Secondary | ICD-10-CM

## 2016-06-22 DIAGNOSIS — M6281 Muscle weakness (generalized): Secondary | ICD-10-CM | POA: Diagnosis not present

## 2016-06-22 NOTE — Therapy (Signed)
[facility] [address] [place], [place], [address] Phone: [phone]   Fax:  [phone]  Physical Therapy Treatment  Patient Details  Name: Nathan Kemp MRN: [id number] Date of Birth: [birth year] Referring Provider: Sonia Baller  Encounter Date: 06/22/2016      PT End of Session - 06/22/16 2043    Visit Number 4   Number of Visits 17   Date for PT Re-Evaluation 08/06/16   Authorization Type Medicare Primary; BCBS secondary-GCODE every 10th visit   PT Start Time 0809  Pt arrives late   PT Stop Time 0845   PT Time Calculation (min) 36 min   Equipment Utilized During Treatment Gait belt   Activity Tolerance Patient tolerated treatment well   Behavior During Therapy [facility] for tasks assessed/performed      Past Medical History:  Diagnosis Date  . Acute GI bleeding 04/16/2011  . Anemia   . Anorexia   . Asthma    when  have congstion  of chest  . Blood dyscrasia 06/2009   thrombocytopenia  . Blood transfusion    platelets 12/12  . 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 Coronary artery disease    stent 06  . Cough   . DDD (degenerative disc disease), cervical   . Diabetes mellitus    borderline-controlled by diet  . ED (erectile dysfunction)   . Essential hypertension   . Fatigue   . GERD (gastroesophageal reflux disease)   . Heart murmur   . Hyperlipidemia   . Hypertension   . Hypothyroidism   . IBS (irritable bowel syndrome)   . Leg swelling    due to lymphedema  . Lymphedema: RLE 02/19/2015  . Obesity   . PMR (polymyalgia rheumatica) ([place]) 02/19/2015  . Pneumonia   . Polymyalgia ([place])   . Thrombocytopenia ([place])   . Thyroid disease   . Trouble swallowing   . Vitamin D deficiency     Past Surgical History:  Procedure Laterality Date  . APPENDECTOMY  1967  . BONE MARROW BIOPSY     2011-at [facility], 01/2010-at [facility], [facility]  . CARDIAC CATHETERIZATION  01/31/2005    EF 60-65%  . cataract Bilateral   . CORONARY ANGIOPLASTY WITH STENT PLACEMENT  2006   STENTING OF HIS CORONARY ARTERY  . EDG Dilitation  [id number]  . 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 LYMPH NODE BIOPSY  03/15/2011   Procedure: LYMPH NODE BIOPSY;  Surgeon: Earnstine Regal, MD;  Location: [facility]S;  Service: General;  Laterality: Right;  Right Anterior Cervical Lymph Node Excisional Biopsy   . lymph node removal  2011   right groin  . NASAL SINUS SURGERY  2005  . TONSILLECTOMY AND ADENOIDECTOMY    . US ECHOCARDIOGRAPHY  02/28/2005   EF 55-60%    There were no vitals filed for this visit.      Subjective Assessment - 06/22/16 0812    Subjective No changes from last visit, just a little sore.   Patient is accompained by: Family member  wife   Pertinent History Leukemia (AML) dx Nov. 2016 (in remission); LUE intention tremor > 5 years; polymyalgia, DM, CAD, dysarthria   Patient Stated Goals Pt's goal for therapy is to improve balance and to walk again.   Currently in Pain? No/denies  [facility] Adult PT Treatment/Exercise - 06/22/16 0829      Transfers   Transfers Sit to Stand;Stand to Sit   Sit to Stand 4: Min guard;5: Supervision;From bed;From chair/3-in-1   Stand to Sit 5: Supervision;4: Min guard;With upper extremity assist;To bed;To chair/3-in-1  UE support at knees   Number of Reps 10 reps;Other sets (comment)  10 reps from mat; 5 reps from chair   Transfer Cueing Initial cues for sit<>stand technique   Comments Stand pivot transfer wheelchair>mat with min guard assistance     Ambulation/Gait   Ambulation/Gait Yes   Ambulation/Gait Assistance 4: Min assist   Ambulation Distance (Feet) 120 Feet   Assistive device Rolling walker   Gait Pattern Step-through pattern;Decreased step length - right;Decreased step length - left;Decreased weight shift to right  Improved step length and  foot clearance with cues    Ambulation Surface Level;Indoor   Pre-Gait Activities Standing at [place], lateral weightshifting x 10 reps, then attempted stagger stance forward/back weightshifting, with tactile and verbal cues.  Upon completion, pt c/o pain in L wrist (wife reports due to arthritis-pt noted to have increased forward lean, excessive UE weigthbearing through walker with standing activity)   Gait Comments Attempted to use hand position splint on RW for L hand for more neutral L hand/wrist positioning, but pt declines to use because of increased discomfort..     High Level Balance   High Level Balance Comments Standing at walker:  lateral weightshifting x 10 reps, then stagger stance forward/back weigthshifting x 10 reps with cues for weigth shift and forward/back excursion through hips           PWR [facility]) - 06/22/16 0814    PWR! exercises Moves in sitting   PWR! Up x 20   PWR! Rock x 20   PWR! Twist x 20   PWR! Step x 20   Comments Review of PWR! MOves-pt return demo understanding of exercises and reports doing exercises daily at home               PT Short Term Goals - 06/15/16 1529      PT SHORT TERM GOAL #1   Title Pt will perform HEP with family supervision for improved posture, strength, gait and balance.  TARGET 07/07/16   Time 4   Period Weeks   Status New     PT SHORT TERM GOAL #2   Title Berg Balance score to be assessed, with goal to be written as appropriate. 2/28 score 32/56; Goal to increase to 37/56 to demonstrate improved balance/safety.    Time 4   Period Weeks   Status On-going     PT SHORT TERM GOAL #3   Title Pt will improve TUG score to less than or equal to 30 seconds for decreased fall risk.   Time 4   Period Weeks   Status New     PT SHORT TERM GOAL #4   Title Pt will perform at least 8 of 10 reps of sit<>stand transfers with minimal UE support, modified independently for improved safety and efficiency with transfers.   Time 4    Period Weeks   Status New     PT SHORT TERM GOAL #5   Title Pt will ambulate at least 200 ft, using RW, with min assistance, for improved safety and efficiency with gait.   Time 4   Period Weeks   Status New     PT SHORT TERM GOAL #6   Title Pt will  negotiate one step no handrail, with min assistance, for safe negotiation of step into and out of home.   Time 4   Period Weeks   Status New           PT Long Term Goals - 06/08/16 1627      PT LONG TERM GOAL #1   Title Pt and wife will verbalize understanding of fall prevention in the home environment.  TARGT 08/06/16   Time 8   Period Weeks   Status New     PT LONG TERM GOAL #2   Title Pt will improve TUG score to less than or equal to 20 seconds for decreased fall risk.   Time 8   Period Weeks   Status New     PT LONG TERM GOAL #3   Title Pt will improve gait velocity to at least 2 ft/sec for decreased fall risk and improved gait efficiency and safety.   Time 8   Period Weeks   Status New     PT LONG TERM GOAL #4   Title Pt will improve Berg Balance score by at least 10 points (from eval) for decreased fall risk.   Time 8   Period Weeks   Status New     PT LONG TERM GOAL #5   Title Pt will ambulate at least 500 ft using RW, with supervision, for improved gait efficiency and safety.   Time 8   Period Weeks   Status New               Plan - 06/22/16 2044    Clinical Impression Statement Pt appears to have improved postural awareness and improved ease of transfers.  Pt continues to have RLE tremors in standing, which are quieted with lateral weightshifting onto RLE; tremor seems to worsen with fatigue with gait.  Will continue to benefit from skilled PT to address strength, balance and gait.   Rehab Potential Good   PT Frequency 2x / week   PT Duration 8 weeks  plus eval   PT Treatment/Interventions ADLs/Self Care Home Management;Functional mobility training;Gait training;Therapeutic activities;Therapeutic  exercise;Balance training;Neuromuscular re-education;Patient/family education   PT Next Visit Plan Try gait training at beginning and end of session; standing weightshifting, balance, functional strengthening in parallel bars versus counter; add standing exercise to HEP as appropriate   PT Home Exercise Plan PWR! Moves in sitting, repeated sit<>stand transfers   Consulted and Agree with Plan of Care Patient;Family member/caregiver   Family Member Consulted wife      Patient will benefit from skilled therapeutic intervention in order to improve the following deficits and impairments:  Abnormal gait, Decreased activity tolerance, Decreased balance, Decreased mobility, Decreased strength, Difficulty walking, Impaired tone, Postural dysfunction  Visit Diagnosis: Abnormal posture  Unsteadiness on feet  Other abnormalities of gait and mobility     Problem List Patient Active Problem List   Diagnosis Date Noted  . AML (acute myelogenous leukemia) ([place]) 02/20/2015  . Leukocytosis 02/19/2015  . Hepatosplenomegaly 02/19/2015  . Lymphedema: RLE 02/19/2015  . PMR (polymyalgia rheumatica) (HCC) 02/19/2015  . Cerebral thrombosis with cerebral infarction ([place]) 10/29/2014  . GERD (gastroesophageal reflux disease) 10/28/2014  . Diabetes mellitus without complication ([place]) 95/62/1308  . History of GI bleed 10/28/2014  . Vertigo 10/28/2014  . Diarrhea 10/28/2014  . Essential hypertension   . Stroke with cerebral ischemia ([place])   . Acute GI bleeding 04/16/2011  . Skin cancer 04/15/2011  . Thrombocytopenia ([place]) 03/28/2011  . Lymphadenopathy, neck  03/08/2011  . CAD (coronary artery disease) 01/13/2011  . Hypothyroidism 03/29/2010  . SPLENOMEGALY 03/29/2010  . HEPATOMEGALY, HX OF 03/29/2010  . FEVER, HX OF 03/29/2010  . HLD (hyperlipidemia) 09/07/2007  . HYPERTENSION 09/07/2007  . COUGH 09/07/2007    Carlee Tesfaye W. 06/22/2016, 8:53 PM  Frazier Butt., PT  [facility] [address] [place], [place], [address] Phone: [phone]   Fax:  [phone]  Name: Nathan Kemp MRN: [id number] Date of Birth: [birth year]

## 2016-06-23 ENCOUNTER — Ambulatory Visit: Payer: Medicare Other | Admitting: Physical Therapy

## 2016-06-23 ENCOUNTER — Encounter: Payer: Self-pay | Admitting: Physical Therapy

## 2016-06-23 DIAGNOSIS — R2681 Unsteadiness on feet: Secondary | ICD-10-CM

## 2016-06-23 DIAGNOSIS — M6281 Muscle weakness (generalized): Secondary | ICD-10-CM

## 2016-06-23 DIAGNOSIS — R2689 Other abnormalities of gait and mobility: Secondary | ICD-10-CM

## 2016-06-23 DIAGNOSIS — R293 Abnormal posture: Secondary | ICD-10-CM | POA: Diagnosis not present

## 2016-06-23 NOTE — Therapy (Signed)
[facility] [address] [place], [place], [address] Phone: [phone]   Fax:  [phone]  Physical Therapy Treatment  Patient Details  Name: Nathan Kemp MRN: [id number] Date of Birth: 1941-03-09 Referring Provider: Sonia Baller  Encounter Date: 06/23/2016      PT End of Session - 06/23/16 1710    Visit Number 5   Number of Visits 17   Date for PT Re-Evaluation 08/06/16   Authorization Type Medicare Primary; BCBS secondary-GCODE every 10th visit   PT Start Time 0800   PT Stop Time 0847   PT Time Calculation (min) 47 min   Equipment Utilized During Treatment Gait belt   Activity Tolerance Patient tolerated treatment well   Behavior During Therapy [facility] for tasks assessed/performed      Past Medical History:  Diagnosis Date  . Acute GI bleeding 04/16/2011  . Anemia   . Anorexia   . Asthma    when  have congstion  of chest  . Blood dyscrasia 06/2009   thrombocytopenia  . Blood transfusion    platelets 12/12  . 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 Coronary artery disease    stent 06  . Cough   . DDD (degenerative disc disease), cervical   . Diabetes mellitus    borderline-controlled by diet  . ED (erectile dysfunction)   . Essential hypertension   . Fatigue   . GERD (gastroesophageal reflux disease)   . Heart murmur   . Hyperlipidemia   . Hypertension   . Hypothyroidism   . IBS (irritable bowel syndrome)   . Leg swelling    due to lymphedema  . Lymphedema: RLE 02/19/2015  . Obesity   . PMR (polymyalgia rheumatica) ([place]) 02/19/2015  . Pneumonia   . Polymyalgia ([place])   . Thrombocytopenia ([place])   . Thyroid disease   . Trouble swallowing   . Vitamin D deficiency     Past Surgical History:  Procedure Laterality Date  . APPENDECTOMY  1967  . BONE MARROW BIOPSY     2011-at [facility], 01/2010-at [facility], [facility]  . CARDIAC CATHETERIZATION  01/31/2005   EF 60-65%  .  cataract Bilateral   . CORONARY ANGIOPLASTY WITH STENT PLACEMENT  2006   STENTING OF HIS CORONARY ARTERY  . EDG Dilitation  [id number]  . 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 LYMPH NODE BIOPSY  03/15/2011   Procedure: LYMPH NODE BIOPSY;  Surgeon: Earnstine Regal, MD;  Location: [facility];  Service: General;  Laterality: Right;  Right Anterior Cervical Lymph Node Excisional Biopsy   . lymph node removal  2011   right groin  . NASAL SINUS SURGERY  2005  . TONSILLECTOMY AND ADENOIDECTOMY    . US ECHOCARDIOGRAPHY  02/28/2005   EF 55-60%    There were no vitals filed for this visit.      Subjective Assessment - 06/23/16 1703    Subjective No changes. Doing all his home exercises. A little sore.   Patient is accompained by: Family member   Pertinent History Leukemia (AML) dx Nov. 2016 (in remission); LUE intention tremor > 5 years; polymyalgia, DM, CAD, dysarthria   Limitations Walking   Patient Stated Goals Pt's goal for therapy is to improve balance and to walk again.   Currently in Pain? No/denies  [facility] Adult PT Treatment/Exercise - 06/23/16 0813      Transfers   Transfers Sit to Stand;Stand to Sit   Sit to Stand 4: Min guard;5: Supervision;From chair/3-in-1;With upper extremity assist  hands on thighs vs armrests   Stand to Sit 5: Supervision;4: Min guard;With upper extremity assist;To chair/3-in-1   Number of Reps Other reps (comment)  x5 throughout session   Comments Wife began to assist pt with sit to stand and cued her to let pt do it (which he did with guarding assist only)     Ambulation/Gait   Ambulation/Gait Assistance 4: Min guard   Ambulation Distance (Feet) 120 Feet  at beginning; 140 ft at end of session   Assistive device Rolling walker   Gait Pattern Step-through pattern;Decreased step length - right;Decreased step length - left;Decreased weight shift to right   Ambulation  Surface Level;Indoor   Pre-Gait Activities in // bars ant-post weight-shift with target for hip to come forward; x 10 each leg; lateral wt-shifts with 2 second hold once over each leg   Gait Comments walking in // bars x 4 lengths with focus on heel strike and ant wt-shift     Posture/Postural Control   Posture/Postural Control Postural limitations   Postural Limitations Forward head;Rounded Shoulders;Posterior pelvic tilt                PT Education - 06/23/16 1709    Education provided Yes   Education Details Addition to HEP   Person(s) Educated Patient   Methods Explanation;Demonstration;Tactile cues;Verbal cues;Handout   Comprehension Verbalized understanding;Returned demonstration;Need further instruction          PT Short Term Goals - 06/15/16 1529      PT SHORT TERM GOAL #1   Title Pt will perform HEP with family supervision for improved posture, strength, gait and balance.  TARGET 07/07/16   Time 4   Period Weeks   Status New     PT SHORT TERM GOAL #2   Title Berg Balance score to be assessed, with goal to be written as appropriate. 2/28 score 32/56; Goal to increase to 37/56 to demonstrate improved balance/safety.    Time 4   Period Weeks   Status On-going     PT SHORT TERM GOAL #3   Title Pt will improve TUG score to less than or equal to 30 seconds for decreased fall risk.   Time 4   Period Weeks   Status New     PT SHORT TERM GOAL #4   Title Pt will perform at least 8 of 10 reps of sit<>stand transfers with minimal UE support, modified independently for improved safety and efficiency with transfers.   Time 4   Period Weeks   Status New     PT SHORT TERM GOAL #5   Title Pt will ambulate at least 200 ft, using RW, with min assistance, for improved safety and efficiency with gait.   Time 4   Period Weeks   Status New     PT SHORT TERM GOAL #6   Title Pt will negotiate one step no handrail, with min assistance, for safe negotiation of step into and  out of home.   Time 4   Period Weeks   Status New           PT Long Term Goals - 06/08/16 1627      PT LONG TERM GOAL #1   Title Pt and wife will verbalize understanding of fall prevention in the home  environment.  TARGT 08/06/16   Time 8   Period Weeks   Status New     PT LONG TERM GOAL #2   Title Pt will improve TUG score to less than or equal to 20 seconds for decreased fall risk.   Time 8   Period Weeks   Status New     PT LONG TERM GOAL #3   Title Pt will improve gait velocity to at least 2 ft/sec for decreased fall risk and improved gait efficiency and safety.   Time 8   Period Weeks   Status New     PT LONG TERM GOAL #4   Title Pt will improve Berg Balance score by at least 10 points (from eval) for decreased fall risk.   Time 8   Period Weeks   Status New     PT LONG TERM GOAL #5   Title Pt will ambulate at least 500 ft using RW, with supervision, for improved gait efficiency and safety.   Time 8   Period Weeks   Status New               Plan - 06/23/16 1711    Clinical Impression Statement Session focused on pre-gait and gait activities to improve technique and safety with gait. Patient's gait has improved significantly in upright posture and distance.    Rehab Potential Good   PT Frequency 2x / week   PT Duration 8 weeks  plus eval   PT Treatment/Interventions ADLs/Self Care Home Management;Functional mobility training;Gait training;Therapeutic activities;Therapeutic exercise;Balance training;Neuromuscular re-education;Patient/family education   PT Next Visit Plan check HEP standing weightshifting ex's, functional strengthening in parallel bars versus counter (?3way hip); add standing exercise to HEP as appropriate   PT Home Exercise Plan PWR! Moves in sitting, repeated sit<>stand transfers, ant-post and lateral wt-shifts   Consulted and Agree with Plan of Care Patient;Family member/caregiver   Family Member Consulted wife      Patient will  benefit from skilled therapeutic intervention in order to improve the following deficits and impairments:  Abnormal gait, Decreased activity tolerance, Decreased balance, Decreased mobility, Decreased strength, Difficulty walking, Impaired tone, Postural dysfunction  Visit Diagnosis: Unsteadiness on feet  Other abnormalities of gait and mobility  Muscle weakness (generalized)     Problem List Patient Active Problem List   Diagnosis Date Noted  . AML (acute myelogenous leukemia) ([place]) 02/20/2015  . Leukocytosis 02/19/2015  . Hepatosplenomegaly 02/19/2015  . Lymphedema: RLE 02/19/2015  . PMR (polymyalgia rheumatica) ([facility]) 02/19/2015  . Cerebral thrombosis with cerebral infarction ([place]) 10/29/2014  . GERD (gastroesophageal reflux disease) 10/28/2014  . Diabetes mellitus without complication ([place]) 30/86/5784  . History of GI bleed 10/28/2014  . Vertigo 10/28/2014  . Diarrhea 10/28/2014  . Essential hypertension   . Stroke with cerebral ischemia ([place])   . Acute GI bleeding 04/16/2011  . Skin cancer 04/15/2011  . Thrombocytopenia ([place]) 03/28/2011  . Lymphadenopathy, neck 03/08/2011  . CAD (coronary artery disease) 01/13/2011  . Hypothyroidism 03/29/2010  . SPLENOMEGALY 03/29/2010  . HEPATOMEGALY, HX OF 03/29/2010  . FEVER, HX OF 03/29/2010  . HLD (hyperlipidemia) 09/07/2007  . HYPERTENSION 09/07/2007  . COUGH 09/07/2007    Rexanne Mano, PT 06/23/2016, 5:17 PM  [facility] [address] [place], [place], [address] Phone: [phone]   Fax:  [phone]  Name: Nathan Kemp MRN: [id number] Date of Birth: Jul 22, 1940

## 2016-06-23 NOTE — Patient Instructions (Signed)
FUNCTIONAL MOBILITY: Weight Shift    Stance: shoulder-width on floor, hands on your walker. Stand with upright posture shift weight from side to side, standing tall over your leg and hip touches inside of walker. Hold __2_ seconds each side. __20_ reps per set, __1_ sets per day, _5__ days per week  Copyright  VHI. All rights reserved.           Forward Weight Shift    Stand with upright posture. Left leg in front of right. Shift weight forward onto left leg, straighten hip and knee "stand tall" through your hip/pelvis. Shift weight back onto right leg and lift your right toes.  __10_ reps each leg, _1__ sets per day, __5_ days per week. Hold onto a support (chair or countertop). Repeat with other leg.  Copyright  VHI. All rights reserved.

## 2016-06-24 ENCOUNTER — Ambulatory Visit: Payer: Medicare Other | Admitting: Physical Therapy

## 2016-06-24 DIAGNOSIS — E039 Hypothyroidism, unspecified: Secondary | ICD-10-CM | POA: Diagnosis not present

## 2016-06-24 DIAGNOSIS — Z9889 Other specified postprocedural states: Secondary | ICD-10-CM | POA: Diagnosis not present

## 2016-06-24 DIAGNOSIS — I2510 Atherosclerotic heart disease of native coronary artery without angina pectoris: Secondary | ICD-10-CM | POA: Diagnosis not present

## 2016-06-24 DIAGNOSIS — L97921 Non-pressure chronic ulcer of unspecified part of left lower leg limited to breakdown of skin: Secondary | ICD-10-CM | POA: Diagnosis not present

## 2016-06-24 DIAGNOSIS — I10 Essential (primary) hypertension: Secondary | ICD-10-CM | POA: Diagnosis not present

## 2016-06-24 DIAGNOSIS — E11622 Type 2 diabetes mellitus with other skin ulcer: Secondary | ICD-10-CM | POA: Diagnosis not present

## 2016-06-24 DIAGNOSIS — I872 Venous insufficiency (chronic) (peripheral): Secondary | ICD-10-CM | POA: Diagnosis not present

## 2016-06-24 DIAGNOSIS — L97321 Non-pressure chronic ulcer of left ankle limited to breakdown of skin: Secondary | ICD-10-CM | POA: Diagnosis not present

## 2016-06-28 ENCOUNTER — Ambulatory Visit: Payer: Medicare Other | Admitting: Physical Therapy

## 2016-06-28 DIAGNOSIS — R2681 Unsteadiness on feet: Secondary | ICD-10-CM

## 2016-06-28 DIAGNOSIS — R2689 Other abnormalities of gait and mobility: Secondary | ICD-10-CM | POA: Diagnosis not present

## 2016-06-28 DIAGNOSIS — M6281 Muscle weakness (generalized): Secondary | ICD-10-CM | POA: Diagnosis not present

## 2016-06-28 DIAGNOSIS — R293 Abnormal posture: Secondary | ICD-10-CM | POA: Diagnosis not present

## 2016-06-28 NOTE — Therapy (Signed)
[facility] [address] [place], [place], [address] Phone: [phone]   Fax:  [phone]  Physical Therapy Treatment  Patient Details  Name: Nathan Kemp MRN: [id number] Date of Birth: 02-09-1941 Referring Provider: Sonia Baller  Encounter Date: 06/28/2016      PT End of Session - 06/28/16 1244    Visit Number 6   Number of Visits 17   Date for PT Re-Evaluation 08/06/16   Authorization Type Medicare Primary; BCBS secondary-GCODE every 10th visit   PT Start Time 1025   PT Stop Time 1103   PT Time Calculation (min) 38 min   Equipment Utilized During Treatment Gait belt   Activity Tolerance Patient tolerated treatment well   Behavior During Therapy WFL for tasks assessed/performed      Past Medical History:  Diagnosis Date  . Acute GI bleeding 04/16/2011  . Anemia   . Anorexia   . Asthma    when  have congstion  of chest  . Blood dyscrasia 06/2009   thrombocytopenia  . Blood transfusion    platelets 12/12  . 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 Coronary artery disease    stent 06  . Cough   . DDD (degenerative disc disease), cervical   . Diabetes mellitus    borderline-controlled by diet  . ED (erectile dysfunction)   . Essential hypertension   . Fatigue   . GERD (gastroesophageal reflux disease)   . Heart murmur   . Hyperlipidemia   . Hypertension   . Hypothyroidism   . IBS (irritable bowel syndrome)   . Leg swelling    due to lymphedema  . Lymphedema: RLE 02/19/2015  . Obesity   . PMR (polymyalgia rheumatica) ([place]) 02/19/2015  . Pneumonia   . Polymyalgia ([place])   . Thrombocytopenia ([place])   . Thyroid disease   . Trouble swallowing   . Vitamin D deficiency     Past Surgical History:  Procedure Laterality Date  . APPENDECTOMY  1967  . BONE MARROW BIOPSY     2011-at [facility], 01/2010-at [place], 06/[facility]  . CARDIAC CATHETERIZATION  01/31/2005   EF 60-65%  .  cataract Bilateral   . CORONARY ANGIOPLASTY WITH STENT PLACEMENT  2006   STENTING OF HIS CORONARY ARTERY  . EDG Dilitation  [id number]  . 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 LYMPH NODE BIOPSY  03/15/2011   Procedure: LYMPH NODE BIOPSY;  Surgeon: Earnstine Regal, MD;  Location: [facility];  Service: General;  Laterality: Right;  Right Anterior Cervical Lymph Node Excisional Biopsy   . lymph node removal  2011   right groin  . NASAL SINUS SURGERY  2005  . TONSILLECTOMY AND ADENOIDECTOMY    . US ECHOCARDIOGRAPHY  02/28/2005   EF 55-60%    There were no vitals filed for this visit.      Subjective Assessment - 06/28/16 1026    Subjective Went to wound care center yesterday due to swelling in LLE from sore.    Per wife-no restrictions for PT, just elevate when sitting.   Patient is accompained by: Family member   Pertinent History Leukemia (AML) dx Nov. 2016 (in remission); LUE intention tremor > 5 years; polymyalgia, DM, CAD, dysarthria   Limitations Walking   Patient Stated Goals Pt's goal for therapy is to improve balance and to walk again.  Currently in Pain? No/denies                         [facility] Adult PT Treatment/Exercise - 06/28/16 1043      Transfers   Transfers Sit to Stand;Stand to Sit   Sit to Stand 4: Min guard;5: Supervision;Without upper extremity assist;From bed  hands on thighs   Stand to Sit 5: Supervision;4: Min guard;Without upper extremity assist;To bed   Number of Reps 10 reps  2 episodes of needing cues for incr. forward lean.  Cues for upright posture upon standing     Ambulation/Gait   Ambulation/Gait Yes   Ambulation/Gait Assistance 4: Min guard   Ambulation/Gait Assistance Details PT provides cues for upright posture, relaxed shoulders, decreased forward flexed posture at RW   Ambulation Distance (Feet) 150 Feet  15 ft x 2   Assistive device Rolling walker   Gait Pattern Step-through  pattern;Decreased step length - right;Decreased step length - left;Decreased weight shift to right   Ambulation Surface Level;Indoor   Pre-Gait Activities Discussed walking activities at home, and pt/wife reports he is walking at home short distances with walker with distance supervision.  Recommended continued walking several times per day in home with RW, with wife's supervision, making sure to have planned path with chairs available for sitting if RLE tremor creates instability/need to sit.   Gait Comments Forward/back walking in parallel bars, 3 reps, with cues for increased heelstrike/step length.  In addition to longer distance gait, short distance walking with turns, with pt demo safe turning ability using walker and step-to pattern to complete turn in narrow space.     High Level Balance   High Level Balance Activities Side stepping  In parallel bars 2 reps with UE support   High Level Balance Comments In parallel bars:  review of lateral weightshifting, then anterior/posterior weight shifting in stagger stance position, x 10 reps.  Pt return demo understanding.  Standing with forward step over obstacle x 10 reps for improved foot clearance, heelstrike and step length.                  PT Short Term Goals - 06/15/16 1529      PT SHORT TERM GOAL #1   Title Pt will perform HEP with family supervision for improved posture, strength, gait and balance.  TARGET 07/07/16   Time 4   Period Weeks   Status New     PT SHORT TERM GOAL #2   Title Berg Balance score to be assessed, with goal to be written as appropriate. 2/28 score 32/56; Goal to increase to 37/56 to demonstrate improved balance/safety.    Time 4   Period Weeks   Status On-going     PT SHORT TERM GOAL #3   Title Pt will improve TUG score to less than or equal to 30 seconds for decreased fall risk.   Time 4   Period Weeks   Status New     PT SHORT TERM GOAL #4   Title Pt will perform at least 8 of 10 reps of  sit<>stand transfers with minimal UE support, modified independently for improved safety and efficiency with transfers.   Time 4   Period Weeks   Status New     PT SHORT TERM GOAL #5   Title Pt will ambulate at least 200 ft, using RW, with min assistance, for improved safety and efficiency with gait.   Time 4   Period  Weeks   Status New     PT SHORT TERM GOAL #6   Title Pt will negotiate one step no handrail, with min assistance, for safe negotiation of step into and out of home.   Time 4   Period Weeks   Status New           PT Long Term Goals - 06/08/16 1627      PT LONG TERM GOAL #1   Title Pt and wife will verbalize understanding of fall prevention in the home environment.  TARGT 08/06/16   Time 8   Period Weeks   Status New     PT LONG TERM GOAL #2   Title Pt will improve TUG score to less than or equal to 20 seconds for decreased fall risk.   Time 8   Period Weeks   Status New     PT LONG TERM GOAL #3   Title Pt will improve gait velocity to at least 2 ft/sec for decreased fall risk and improved gait efficiency and safety.   Time 8   Period Weeks   Status New     PT LONG TERM GOAL #4   Title Pt will improve Berg Balance score by at least 10 points (from eval) for decreased fall risk.   Time 8   Period Weeks   Status New     PT LONG TERM GOAL #5   Title Pt will ambulate at least 500 ft using RW, with supervision, for improved gait efficiency and safety.   Time 8   Period Weeks   Status New               Plan - 06/28/16 1245    Clinical Impression Statement Pt continues to demo improved upright posture with gait, but continues heavy reliance on walker.  With fatigue at end of session, pt demo increased tremors in RLE with standing and gait.  Pt will continue to benefit from skilled PT to address balance, strength and gait.   Rehab Potential Good   PT Frequency 2x / week   PT Duration 8 weeks  plus eval   PT Treatment/Interventions ADLs/Self Care  Home Management;Functional mobility training;Gait training;Therapeutic activities;Therapeutic exercise;Balance training;Neuromuscular re-education;Patient/family education   PT Next Visit Plan Work on functional strengthening in parallel bars versus counter-try 3 way hip/add to HEP as appropriate; at counter-sidestepping/forward/back walk and march-may add to [place]! Moves in sitting, repeated sit<>stand transfers, ant-post and lateral wt-shifts   Consulted and Agree with Plan of Care Patient;Family member/caregiver   Family Member Consulted wife      Patient will benefit from skilled therapeutic intervention in order to improve the following deficits and impairments:  Abnormal gait, Decreased activity tolerance, Decreased balance, Decreased mobility, Decreased strength, Difficulty walking, Impaired tone, Postural dysfunction  Visit Diagnosis: Unsteadiness on feet  Other abnormalities of gait and mobility     Problem List Patient Active Problem List   Diagnosis Date Noted  . AML (acute myelogenous leukemia) ([place]) 02/20/2015  . Leukocytosis 02/19/2015  . Hepatosplenomegaly 02/19/2015  . Lymphedema: RLE 02/19/2015  . PMR (polymyalgia rheumatica) (HCC) 02/19/2015  . Cerebral thrombosis with cerebral infarction ([place]) 10/29/2014  . GERD (gastroesophageal reflux disease) 10/28/2014  . Diabetes mellitus without complication ([place]) 09/60/4540  . History of GI bleed 10/28/2014  . Vertigo 10/28/2014  . Diarrhea 10/28/2014  . Essential hypertension   . Stroke with cerebral ischemia ([place])   . Acute GI bleeding 04/16/2011  .  Skin cancer 04/15/2011  . Thrombocytopenia ([place]) 03/28/2011  . Lymphadenopathy, neck 03/08/2011  . CAD (coronary artery disease) 01/13/2011  . Hypothyroidism 03/29/2010  . SPLENOMEGALY 03/29/2010  . HEPATOMEGALY, HX OF 03/29/2010  . FEVER, HX OF 03/29/2010  . HLD (hyperlipidemia) 09/07/2007  . HYPERTENSION 09/07/2007  . COUGH 09/07/2007     MARRIOTT,AMY W. 06/28/2016, 12:49 PM  Frazier Butt., PT  [facility] [address] [place], [place], [address] Phone: [phone]   Fax:  [phone]  Name: Nathan Kemp MRN: [id number] Date of Birth: 1940-11-26

## 2016-06-29 DIAGNOSIS — R413 Other amnesia: Secondary | ICD-10-CM | POA: Diagnosis not present

## 2016-06-30 ENCOUNTER — Ambulatory Visit: Payer: Medicare Other | Admitting: Physical Therapy

## 2016-06-30 DIAGNOSIS — M6281 Muscle weakness (generalized): Secondary | ICD-10-CM

## 2016-06-30 DIAGNOSIS — R2689 Other abnormalities of gait and mobility: Secondary | ICD-10-CM

## 2016-06-30 DIAGNOSIS — R2681 Unsteadiness on feet: Secondary | ICD-10-CM

## 2016-06-30 DIAGNOSIS — R293 Abnormal posture: Secondary | ICD-10-CM | POA: Diagnosis not present

## 2016-06-30 NOTE — Therapy (Signed)
[name] [address] [place], [place], [address] Phone: [phone]   Fax:  [phone]  Physical Therapy Treatment  Patient Details  Name: Nathan Kemp MRN: [id number] Date of Birth: 1940-09-26 Referring Provider: Sonia Baller  Encounter Date: 06/30/2016      PT End of Session - 06/30/16 1054    Visit Number 7   Number of Visits 17   Date for PT Re-Evaluation 08/06/16   Authorization Type Medicare Primary; BCBS secondary-GCODE every 10th visit   PT Start Time 0928   PT Stop Time 1018   PT Time Calculation (min) 50 min   Equipment Utilized During Treatment Gait belt   Activity Tolerance Patient tolerated treatment well   Behavior During Therapy [facility] for tasks assessed/performed      Past Medical History:  Diagnosis Date  . Acute GI bleeding 04/16/2011  . Anemia   . Anorexia   . Asthma    when  have congstion  of chest  . Blood dyscrasia 06/2009   thrombocytopenia  . Blood transfusion    platelets 12/12  . 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 Coronary artery disease    stent 06  . Cough   . DDD (degenerative disc disease), cervical   . Diabetes mellitus    borderline-controlled by diet  . ED (erectile dysfunction)   . Essential hypertension   . Fatigue   . GERD (gastroesophageal reflux disease)   . Heart murmur   . Hyperlipidemia   . Hypertension   . Hypothyroidism   . IBS (irritable bowel syndrome)   . Leg swelling    due to lymphedema  . Lymphedema: RLE 02/19/2015  . Obesity   . PMR (polymyalgia rheumatica) ([place]) 02/19/2015  . Pneumonia   . Polymyalgia ([place])   . Thrombocytopenia ([place])   . Thyroid disease   . Trouble swallowing   . Vitamin D deficiency     Past Surgical History:  Procedure Laterality Date  . APPENDECTOMY  1967  . BONE MARROW BIOPSY     2011-at [facility], 01/2010-at [place] [facility], 06/12-[facility]  . CARDIAC CATHETERIZATION  01/31/2005   EF 60-65%  .  cataract Bilateral   . CORONARY ANGIOPLASTY WITH STENT PLACEMENT  2006   STENTING OF HIS CORONARY ARTERY  . EDG Dilitation  [id number]  . 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 LYMPH NODE BIOPSY  03/15/2011   Procedure: LYMPH NODE BIOPSY;  Surgeon: Nathan Regal, MD;  Location: [facility];  Service: General;  Laterality: Right;  Right Anterior Cervical Lymph Node Excisional Biopsy   . lymph node removal  2011   right groin  . NASAL SINUS SURGERY  2005  . TONSILLECTOMY AND ADENOIDECTOMY    . US ECHOCARDIOGRAPHY  02/28/2005   EF 55-60%    There were no vitals filed for this visit.      Subjective Assessment - 06/30/16 0930    Subjective Went to Neuropsychologist yesterday in [facility] (Dr. Norton Kemp); have wound center appt tomorrow.   Patient is accompained by: Family member   Pertinent History Leukemia (AML) dx Nov. 2016 (in remission); LUE intention tremor > 5 years; polymyalgia, DM, CAD, dysarthria   Limitations Walking   Patient Stated Goals Pt's goal for therapy is to improve balance and to walk again.   Currently in Pain? No/denies  [facility] Adult PT Treatment/Exercise - 06/30/16 0939      Transfers   Transfers Sit to Stand;Stand to Sit   Sit to Stand 4: Min guard;5: Supervision;Without upper extremity assist;From bed   Stand to Sit 5: Supervision;4: Min guard;Without upper extremity assist;To bed   Number of Reps --  Multiple reps throughout session   Comments Upon initial standing, pt independently performs lateral weigthshift in preparation for gait.  Cues provided for widened BOS.     Ambulation/Gait   Ambulation/Gait Yes   Ambulation/Gait Assistance 4: Min guard   Ambulation Distance (Feet) 150 Feet  then 300 ft   Assistive device Rolling walker   Gait Pattern Step-through pattern;Decreased step length - right;Decreased step length - left;Decreased weight shift to right    Ambulation Surface Level;Indoor   Pre-Gait Activities Forward/back walking in parallel bars, 3 reps x 10 ft, with cues for upright posture, good foot clearance on RLE.   Gait Comments Pt experiences several episodes of RLE tremor with negotiating curves, turns with gait.  Pt able to stop briefly and lessen tremor with cues for lateral weightshifting.       High Level Balance   High Level Balance Activities Side stepping  In parallel bars, 2 reps with UE support   High Level Balance Comments In parallel bars:  step over obstacle x 10 reps with UE support. Standing with widened BOS, EO head nods x 6 reps, head turns x 6 reps, then EC x 10 sec no UE support with min guard.  Standing widened BOS with alternating UE lifts, then bilateral UE lifts x 10 reps each, min guard assistance, to work on lessening UE support in standing.     Exercises   Exercises Knee/Hip     Knee/Hip Exercises: Standing   Hip Flexion AROM;Stengthening;Right;Left;Both;10 reps;Knee bent;Knee straight   Hip Abduction AROM;Stengthening;Right;Left;1 set;10 reps   Hip Extension AROM;Stengthening;Right;Left;1 set;10 reps     Alternating step taps to 2" block, x 10 reps each leg.  All exercises above performed with UE support at parallel bars.             PT Short Term Goals - 06/15/16 1529      PT SHORT TERM GOAL #1   Title Pt will perform HEP with family supervision for improved posture, strength, gait and balance.  TARGET 07/07/16   Time 4   Period Weeks   Status New     PT SHORT TERM GOAL #2   Title Berg Balance score to be assessed, with goal to be written as appropriate. 2/28 score 32/56; Goal to increase to 37/56 to demonstrate improved balance/safety.    Time 4   Period Weeks   Status On-going     PT SHORT TERM GOAL #3   Title Pt will improve TUG score to less than or equal to 30 seconds for decreased fall risk.   Time 4   Period Weeks   Status New     PT SHORT TERM GOAL #4   Title Pt will perform  at least 8 of 10 reps of sit<>stand transfers with minimal UE support, modified independently for improved safety and efficiency with transfers.   Time 4   Period Weeks   Status New     PT SHORT TERM GOAL #5   Title Pt will ambulate at least 200 ft, using RW, with min assistance, for improved safety and efficiency with gait.   Time 4   Period Weeks   Status New  PT SHORT TERM GOAL #6   Title Pt will negotiate one step no handrail, with min assistance, for safe negotiation of step into and out of home.   Time 4   Period Weeks   Status New           PT Long Term Goals - 06/08/16 1627      PT LONG TERM GOAL #1   Title Pt and wife will verbalize understanding of fall prevention in the home environment.  TARGT 08/06/16   Time 8   Period Weeks   Status New     PT LONG TERM GOAL #2   Title Pt will improve TUG score to less than or equal to 20 seconds for decreased fall risk.   Time 8   Period Weeks   Status New     PT LONG TERM GOAL #3   Title Pt will improve gait velocity to at least 2 ft/sec for decreased fall risk and improved gait efficiency and safety.   Time 8   Period Weeks   Status New     PT LONG TERM GOAL #4   Title Pt will improve Berg Balance score by at least 10 points (from eval) for decreased fall risk.   Time 8   Period Weeks   Status New     PT LONG TERM GOAL #5   Title Pt will ambulate at least 500 ft using RW, with supervision, for improved gait efficiency and safety.   Time 8   Period Weeks   Status New               Plan - 06/30/16 1055    Clinical Impression Statement Pt able to continue to demo improved gait distance in therapy sessions this week, but does need occasional cueing for lateral weightshifting in standing to lessen intensity of RLE tremor.  Pt able to perform standing activities in parallel bars with several brief seated rest breaks.  Pt will continue to benefit from skilled PT to address balance, strength and gait towards  goals.   Rehab Potential Good   PT Frequency 2x / week   PT Duration 8 weeks  plus eval   PT Treatment/Interventions ADLs/Self Care Home Management;Functional mobility training;Gait training;Therapeutic activities;Therapeutic exercise;Balance training;Neuromuscular re-education;Patient/family education   PT Next Visit Plan Check STGs; work on standing strengthening and balance activities at sink/counter to try to add to [place]! Moves in sitting, repeated sit<>stand transfers, ant-post and lateral wt-shifts   Consulted and Agree with Plan of Care Patient;Family member/caregiver   Family Member Consulted wife      Patient will benefit from skilled therapeutic intervention in order to improve the following deficits and impairments:  Abnormal gait, Decreased activity tolerance, Decreased balance, Decreased mobility, Decreased strength, Difficulty walking, Impaired tone, Postural dysfunction  Visit Diagnosis: Unsteadiness on feet  Muscle weakness (generalized)  Other abnormalities of gait and mobility     Problem List Patient Active Problem List   Diagnosis Date Noted  . AML (acute myelogenous leukemia) ([place]) 02/20/2015  . Leukocytosis 02/19/2015  . Hepatosplenomegaly 02/19/2015  . Lymphedema: RLE 02/19/2015  . PMR (polymyalgia rheumatica) ([facility]) 02/19/2015  . Cerebral thrombosis with cerebral infarction ([place]) 10/29/2014  . GERD (gastroesophageal reflux disease) 10/28/2014  . Diabetes mellitus without complication ([place]) 16/01/9603  . History of GI bleed 10/28/2014  . Vertigo 10/28/2014  . Diarrhea 10/28/2014  . Essential hypertension   . Stroke with cerebral ischemia ([place])   .  Acute GI bleeding 04/16/2011  . Skin cancer 04/15/2011  . Thrombocytopenia ([place]) 03/28/2011  . Lymphadenopathy, neck 03/08/2011  . CAD (coronary artery disease) 01/13/2011  . Hypothyroidism 03/29/2010  . SPLENOMEGALY 03/29/2010  . HEPATOMEGALY, HX OF 03/29/2010  . FEVER, HX OF  03/29/2010  . HLD (hyperlipidemia) 09/07/2007  . HYPERTENSION 09/07/2007  . COUGH 09/07/2007    Zeke Aker W. 06/30/2016, 10:59 AM [facility] [address] [place], [place], [address] Phone: [phone]   Fax:  [phone]  Name: JORGELUIS GURGANUS MRN: [id number] Date of Birth: [birth year]

## 2016-07-01 DIAGNOSIS — I878 Other specified disorders of veins: Secondary | ICD-10-CM | POA: Diagnosis not present

## 2016-07-01 DIAGNOSIS — E039 Hypothyroidism, unspecified: Secondary | ICD-10-CM | POA: Diagnosis not present

## 2016-07-01 DIAGNOSIS — I890 Lymphedema, not elsewhere classified: Secondary | ICD-10-CM | POA: Diagnosis not present

## 2016-07-01 DIAGNOSIS — I10 Essential (primary) hypertension: Secondary | ICD-10-CM | POA: Diagnosis not present

## 2016-07-01 DIAGNOSIS — E11621 Type 2 diabetes mellitus with foot ulcer: Secondary | ICD-10-CM | POA: Diagnosis not present

## 2016-07-01 DIAGNOSIS — Z9181 History of falling: Secondary | ICD-10-CM | POA: Diagnosis not present

## 2016-07-01 DIAGNOSIS — I2510 Atherosclerotic heart disease of native coronary artery without angina pectoris: Secondary | ICD-10-CM | POA: Diagnosis not present

## 2016-07-01 DIAGNOSIS — E11622 Type 2 diabetes mellitus with other skin ulcer: Secondary | ICD-10-CM | POA: Diagnosis not present

## 2016-07-01 DIAGNOSIS — Z9889 Other specified postprocedural states: Secondary | ICD-10-CM | POA: Diagnosis not present

## 2016-07-01 DIAGNOSIS — L97321 Non-pressure chronic ulcer of left ankle limited to breakdown of skin: Secondary | ICD-10-CM | POA: Diagnosis not present

## 2016-07-01 DIAGNOSIS — I872 Venous insufficiency (chronic) (peripheral): Secondary | ICD-10-CM | POA: Diagnosis not present

## 2016-07-05 ENCOUNTER — Ambulatory Visit: Payer: Medicare Other | Admitting: Physical Therapy

## 2016-07-05 DIAGNOSIS — R2689 Other abnormalities of gait and mobility: Secondary | ICD-10-CM

## 2016-07-05 DIAGNOSIS — R293 Abnormal posture: Secondary | ICD-10-CM | POA: Diagnosis not present

## 2016-07-05 DIAGNOSIS — R2681 Unsteadiness on feet: Secondary | ICD-10-CM

## 2016-07-05 DIAGNOSIS — R932 Abnormal findings on diagnostic imaging of liver and biliary tract: Secondary | ICD-10-CM | POA: Diagnosis not present

## 2016-07-05 DIAGNOSIS — M6281 Muscle weakness (generalized): Secondary | ICD-10-CM | POA: Diagnosis not present

## 2016-07-05 NOTE — Therapy (Signed)
[facility] [address] [place], [place], [address] Phone: [phone]   Fax:  [phone]  Physical Therapy Treatment  Patient Details  Name: Nathan Kemp MRN: [id number] Date of Birth: 1940/08/29 Referring Provider: Sonia Baller  Encounter Date: 07/05/2016      PT End of Session - 07/05/16 2159    Visit Number 8   Number of Visits 17   Date for PT Re-Evaluation 08/06/16   Authorization Type Medicare Primary; BCBS secondary-GCODE every 10th visit   PT Start Time 0930   PT Stop Time 1015   PT Time Calculation (min) 45 min   Equipment Utilized During Treatment Gait belt   Activity Tolerance Patient tolerated treatment well   Behavior During Therapy [facility] for tasks assessed/performed      Past Medical History:  Diagnosis Date  . Acute GI bleeding 04/16/2011  . Anemia   . Anorexia   . Asthma    when  have congstion  of chest  . Blood dyscrasia 06/2009   thrombocytopenia  . Blood transfusion    platelets 12/12  . 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 Coronary artery disease    stent 06  . Cough   . DDD (degenerative disc disease), cervical   . Diabetes mellitus    borderline-controlled by diet  . ED (erectile dysfunction)   . Essential hypertension   . Fatigue   . GERD (gastroesophageal reflux disease)   . Heart murmur   . Hyperlipidemia   . Hypertension   . Hypothyroidism   . IBS (irritable bowel syndrome)   . Leg swelling    due to lymphedema  . Lymphedema: RLE 02/19/2015  . Obesity   . PMR (polymyalgia rheumatica) ([place]) 02/19/2015  . Pneumonia   . Polymyalgia ([place])   . Thrombocytopenia ([place])   . Thyroid disease   . Trouble swallowing   . Vitamin D deficiency     Past Surgical History:  Procedure Laterality Date  . APPENDECTOMY  1967  . BONE MARROW BIOPSY     2011-at [facility], 01/2010-at [facility], [facility]  . CARDIAC CATHETERIZATION  01/31/2005   EF 60-65%  .  cataract Bilateral   . CORONARY ANGIOPLASTY WITH STENT PLACEMENT  2006   STENTING OF HIS CORONARY ARTERY  . EDG Dilitation  [id number]  . 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 LYMPH NODE BIOPSY  03/15/2011   Procedure: LYMPH NODE BIOPSY;  Surgeon: Earnstine Regal, MD;  Location: [facility];  Service: General;  Laterality: Right;  Right Anterior Cervical Lymph Node Excisional Biopsy   . lymph node removal  2011   right groin  . NASAL SINUS SURGERY  2005  . TONSILLECTOMY AND ADENOIDECTOMY    . US ECHOCARDIOGRAPHY  02/28/2005   EF 55-60%    There were no vitals filed for this visit.      Subjective Assessment - 07/05/16 0934    Subjective Feel tired today.  Wife reports she thinks ammonia levels are elevated and that makes him tired, and they are going for lab work after therapy today.   Patient is accompained by: Family member   Pertinent History Leukemia (AML) dx Nov. 2016 (in remission); LUE intention tremor > 5 years; polymyalgia, DM, CAD, dysarthria   Limitations Walking   Patient Stated Goals Pt's goal for therapy is to improve balance and to walk again.  Currently in Pain? No/denies                         [facility] Adult PT Treatment/Exercise - 07/05/16 0945      Transfers   Transfers Sit to Stand;Stand to Sit   Sit to Stand 5: Supervision   Stand to Sit 5: Supervision   Number of Reps 10 reps  from 18" chair, minimal UE support at knees     Ambulation/Gait   Ambulation/Gait Yes   Ambulation/Gait Assistance 4: Min guard   Ambulation Distance (Feet) 100 Feet  then 200 ft, then 60 ft   Assistive device Rolling walker   Gait Pattern Step-through pattern;Decreased step length - right;Decreased step length - left;Decreased weight shift to right   Ambulation Surface Level;Indoor   Curb 4: Min assist   Curb Details (indicate cue type and reason) Cues for walker placememt and foot placement   Gait Comments Pt needs  several rest breaks with standing weightshifts to lessen RLE tremors.   Practiced narrow space turns, with cues for "walker turn, step to walker", with cues for improved foot clearance.     Standardized Balance Assessment   Standardized Balance Assessment Timed Up and Go Test     Timed Up and Go Test   TUG Normal TUG   Normal TUG (seconds) 52.28  2nd trial 43.72 sec     High Level Balance   High Level Balance Activities Side stepping   High Level Balance Comments AT counter:  alternating hip kicks to side x 10, alternating hip kicks back x 10, then semi-tandem stance with UE support 10 sec     Sidestepping along counter, 3 reps 10 ft along counter.  Lateral weightshifting x 10 reps.  Cues for upright posture with standing at counter.             PT Short Term Goals - 07/05/16 1002      PT SHORT TERM GOAL #1   Title Pt will perform HEP with family supervision for improved posture, strength, gait and balance.  TARGET 07/07/16   Time 4   Period Weeks   Status New     PT SHORT TERM GOAL #2   Title Berg Balance score to be assessed, with goal to be written as appropriate. 2/28 score 32/56; Goal to increase to 37/56 to demonstrate improved balance/safety.    Time 4   Period Weeks   Status On-going     PT SHORT TERM GOAL #3   Title Pt will improve TUG score to less than or equal to 30 seconds for decreased fall risk.   Baseline 43.72 sec on 07/05/16   Time 4   Period Weeks   Status Not Met     PT SHORT TERM GOAL #4   Title Pt will perform at least 8 of 10 reps of sit<>stand transfers with minimal UE support, modified independently for improved safety and efficiency with transfers.   Time 4   Period Weeks   Status Achieved     PT SHORT TERM GOAL #5   Title Pt will ambulate at least 200 ft, using RW, with min assistance, for improved safety and efficiency with gait.   Time 4   Period Weeks   Status Achieved     PT SHORT TERM GOAL #6   Title Pt will negotiate one step  no handrail, with min assistance, for safe negotiation of step into and out of home.   Time  4   Period Weeks   Status Achieved           PT Long Term Goals - 06/08/16 1627      PT LONG TERM GOAL #1   Title Pt and wife will verbalize understanding of fall prevention in the home environment.  TARGT 08/06/16   Time 8   Period Weeks   Status New     PT LONG TERM GOAL #2   Title Pt will improve TUG score to less than or equal to 20 seconds for decreased fall risk.   Time 8   Period Weeks   Status New     PT LONG TERM GOAL #3   Title Pt will improve gait velocity to at least 2 ft/sec for decreased fall risk and improved gait efficiency and safety.   Time 8   Period Weeks   Status New     PT LONG TERM GOAL #4   Title Pt will improve Berg Balance score by at least 10 points (from eval) for decreased fall risk.   Time 8   Period Weeks   Status New     PT LONG TERM GOAL #5   Title Pt will ambulate at least 500 ft using RW, with supervision, for improved gait efficiency and safety.   Time 8   Period Weeks   Status New               Plan - 07/05/16 2200    Clinical Impression Statement Pt has met STG 4, 5, 6.  STG 3 not met.  STG 1 and 2 to be checked next visit.  Pt has made progress with gait distance and transfers, but TUG score not improved this visit.  Pt feeling fatigued this visit and may be contributing factor.  Pt performs standing exercises at counter well without difficulty, and will continue to benefit from skilled physical therapy to address balanc,e strength and gait.   Rehab Potential Good   PT Frequency 2x / week   PT Duration 8 weeks  plus eval   PT Treatment/Interventions ADLs/Self Care Home Management;Functional mobility training;Gait training;Therapeutic activities;Therapeutic exercise;Balance training;Neuromuscular re-education;Patient/family education   PT Next Visit Plan Check remaining STGs; work on standing strengthening and balance activities at  sink/counter to try to add to [place]! Moves in sitting, repeated sit<>stand transfers, ant-post and lateral wt-shifts   Consulted and Agree with Plan of Care Patient;Family member/caregiver   Family Member Consulted wife      Patient will benefit from skilled therapeutic intervention in order to improve the following deficits and impairments:  Abnormal gait, Decreased activity tolerance, Decreased balance, Decreased mobility, Decreased strength, Difficulty walking, Impaired tone, Postural dysfunction  Visit Diagnosis: Unsteadiness on feet  Other abnormalities of gait and mobility     Problem List Patient Active Problem List   Diagnosis Date Noted  . AML (acute myelogenous leukemia) ([place]) 02/20/2015  . Leukocytosis 02/19/2015  . Hepatosplenomegaly 02/19/2015  . Lymphedema: RLE 02/19/2015  . PMR (polymyalgia rheumatica) (HCC) 02/19/2015  . Cerebral thrombosis with cerebral infarction ([place]) 10/29/2014  . GERD (gastroesophageal reflux disease) 10/28/2014  . Diabetes mellitus without complication ([place]) 62/95/2841  . History of GI bleed 10/28/2014  . Vertigo 10/28/2014  . Diarrhea 10/28/2014  . Essential hypertension   . Stroke with cerebral ischemia ([place])   . Acute GI bleeding 04/16/2011  . Skin cancer 04/15/2011  . Thrombocytopenia ([place]) 03/28/2011  . Lymphadenopathy, neck 03/08/2011  . CAD (  coronary artery disease) 01/13/2011  . Hypothyroidism 03/29/2010  . SPLENOMEGALY 03/29/2010  . HEPATOMEGALY, HX OF 03/29/2010  . FEVER, HX OF 03/29/2010  . HLD (hyperlipidemia) 09/07/2007  . HYPERTENSION 09/07/2007  . COUGH 09/07/2007    Orlin Kann W. 07/05/2016, 10:03 PM Frazier Butt., PT [facility] [address] [place], [place], [address] Phone: [phone]   Fax:  [phone]  Name: MALAKHAI BEITLER MRN: [id number] Date of Birth: 1941-01-18

## 2016-07-06 DIAGNOSIS — M25532 Pain in left wrist: Secondary | ICD-10-CM | POA: Diagnosis not present

## 2016-07-06 DIAGNOSIS — M19032 Primary osteoarthritis, left wrist: Secondary | ICD-10-CM | POA: Diagnosis not present

## 2016-07-07 ENCOUNTER — Ambulatory Visit: Payer: Medicare Other | Admitting: Physical Therapy

## 2016-07-07 DIAGNOSIS — R293 Abnormal posture: Secondary | ICD-10-CM | POA: Diagnosis not present

## 2016-07-07 DIAGNOSIS — R2681 Unsteadiness on feet: Secondary | ICD-10-CM | POA: Diagnosis not present

## 2016-07-07 DIAGNOSIS — R2689 Other abnormalities of gait and mobility: Secondary | ICD-10-CM | POA: Diagnosis not present

## 2016-07-07 DIAGNOSIS — M6281 Muscle weakness (generalized): Secondary | ICD-10-CM | POA: Diagnosis not present

## 2016-07-07 NOTE — Patient Instructions (Addendum)
STAND AT THE COUNTER-AT THE SINK FOR SUPPORT  Standing Hip Abduction    While standing, raise your leg out to the side. Keep your knee straight and maintain your toes pointed forward the entire time.  Repeat 10 times for 1-2 sets per day.  You can alternate your legs with this exercise. Use your arms for support if needed for balance and safety.  Standing Hip Extension    While standing, balance on one leg and move your other leg in a backward direction. Do not swing the leg. Perform smooth and controlled movements.   Keep your trunk stable and without arching during the movement.  Repeat 10 times for 1-2 sets.  Alternate your legs.  Use your arms for support if needed for balance and safety.  "I love a Parade" Lift    Standing beside the counter, march in place as high as you can.  You may need a chair or the walker or hand held assistance with your other hand for support. Repeat __10__ times. Do _1-2___ sessions per day.  http://gt2.exer.us/344   Copyright  VHI. All rights reserved.

## 2016-07-07 NOTE — Therapy (Signed)
[facility] [address] [place], [place], [address] Phone: [phone]   Fax:  [phone]  Physical Therapy Treatment  Patient Details  Name: Nathan Kemp MRN: [id number] Date of Birth: [birth year] Referring Provider: Sonia Baller  Encounter Date: 07/07/2016      PT End of Session - 07/07/16 1230    Visit Number 9   Number of Visits 17   Date for PT Re-Evaluation 08/06/16   Authorization Type Medicare Primary; BCBS secondary-GCODE every 10th visit   PT Start Time 0850   PT Stop Time 0933   PT Time Calculation (min) 43 min   Equipment Utilized During Treatment Gait belt   Activity Tolerance Patient tolerated treatment well   Behavior During Therapy [facility] for tasks assessed/performed      Past Medical History:  Diagnosis Date  . Acute GI bleeding 04/16/2011  . Anemia   . Anorexia   . Asthma    when  have congstion  of chest  . Blood dyscrasia 06/2009   thrombocytopenia  . Blood transfusion    platelets 12/12  . 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 Coronary artery disease    stent 06  . Cough   . DDD (degenerative disc disease), cervical   . Diabetes mellitus    borderline-controlled by diet  . ED (erectile dysfunction)   . Essential hypertension   . Fatigue   . GERD (gastroesophageal reflux disease)   . Heart murmur   . Hyperlipidemia   . Hypertension   . Hypothyroidism   . IBS (irritable bowel syndrome)   . Leg swelling    due to lymphedema  . Lymphedema: RLE 02/19/2015  . Obesity   . PMR (polymyalgia rheumatica) ([place]) 02/19/2015  . Pneumonia   . Polymyalgia ([place])   . Thrombocytopenia ([place])   . Thyroid disease   . Trouble swallowing   . Vitamin D deficiency     Past Surgical History:  Procedure Laterality Date  . APPENDECTOMY  1967  . BONE MARROW BIOPSY     2011-at [facility], 01/2010-at [facility], [facility]  . CARDIAC CATHETERIZATION  01/31/2005   EF 60-65%  .  cataract Bilateral   . CORONARY ANGIOPLASTY WITH STENT PLACEMENT  2006   STENTING OF HIS CORONARY ARTERY  . EDG Dilitation  [id number]  . 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 LYMPH NODE BIOPSY  03/15/2011   Procedure: LYMPH NODE BIOPSY;  Surgeon: Earnstine Regal, MD;  Location: [facility];  Service: General;  Laterality: Right;  Right Anterior Cervical Lymph Node Excisional Biopsy   . lymph node removal  2011   right groin  . NASAL SINUS SURGERY  2005  . TONSILLECTOMY AND ADENOIDECTOMY    . US ECHOCARDIOGRAPHY  02/28/2005   EF 55-60%    There were no vitals filed for this visit.      Subjective Assessment - 07/07/16 0852    Subjective Feeling more tremors and shakiness today and yesterday.  Voice almost understandable this week.  Will likely see Dr. Hall Busing in May.   Patient is accompained by: Family member   Pertinent History Leukemia (AML) dx Nov. 2016 (in remission); LUE intention tremor > 5 years; polymyalgia, DM, CAD, dysarthria   Limitations Walking   Patient Stated Goals Pt's goal for therapy is to improve balance and to walk again.   Currently in Pain?  No/denies                         [facility] Adult PT Treatment/Exercise - 07/07/16 0901      Berg Balance Test   Sit to Stand Able to stand without using hands and stabilize independently   Standing Unsupported Able to stand safely 2 minutes   Sitting with Back Unsupported but Feet Supported on Floor or Stool Able to sit safely and securely 2 minutes   Stand to Sit Sits safely with minimal use of hands   Transfers Able to transfer safely, definite need of hands   Standing Unsupported with Eyes Closed Able to stand 10 seconds safely   Standing Ubsupported with Feet Together Able to place feet together independently and stand for 1 minute with supervision   From Standing, Reach Forward with Outstretched Arm Reaches forward but needs supervision   From Standing  Position, Pick up Object from [place] to pick up shoe, needs supervision   From Standing Position, Turn to Look Behind Over each Shoulder Looks behind from both sides and weight shifts well   Turn 360 Degrees Needs close supervision or verbal cueing   Standing Unsupported, Alternately Place Feet on Step/Stool Needs assistance to keep from falling or unable to try   Standing Unsupported, One Foot in Front Able to take small step independently and hold 30 seconds   Standing on One Leg Tries to lift leg/unable to hold 3 seconds but remains standing independently   Total Score 38     High Level Balance   High Level Balance Comments AT counter:  alternating hip kicks to side x 10, alternating hip kicks back x 10, then semi-tandem stance with UE support 10 sec     Self-Care   Self-Care Other Self-Care Comments   Other Self-Care Comments  Wife verbalizes frustration about not being able to get into see neurologist until May 10, especially since she has questions and is having trouble getting medications refilled.  Discussed option for possibility of speech therapy to assist with compensations/communication.         Neuro Re-education:  Continued:  -Review of seated PWR! Moves as part of HEP-PWR! UP x 5 reps, then PWR! Rock 5 reps each side, PWR! Twist x 5 reps each side, then PWR! Step 5 reps each  side.   -Reviewed standing lateral weightshifting and stagger stance forward/back weightshifting x 5 reps each.  -Pt able to perform HEP with supervision, min verbal cues; reports performing consistently with wife's help at home.         PT Education - 07/07/16 1230    Education provided Yes   Education Details Addition to HEP-standing hip kicks and marching   Person(s) Educated Patient;Spouse   Methods Explanation;Demonstration;Handout;Verbal cues   Comprehension Returned demonstration;Verbalized understanding;Verbal cues required          PT Short Term Goals - 07/07/16 1232      PT  SHORT TERM GOAL #1   Title Pt will perform HEP with family supervision for improved posture, strength, gait and balance.  TARGET 07/07/16   Time 4   Period Weeks   Status Achieved     PT SHORT TERM GOAL #2   Title Berg Balance score to be assessed, with goal to be written as appropriate. 2/28 score 32/56; Goal to increase to 37/56 to demonstrate improved balance/safety.    Baseline 38/56 on 07/07/16   Time 4   Period Weeks  Status Achieved     PT SHORT TERM GOAL #3   Title Pt will improve TUG score to less than or equal to 30 seconds for decreased fall risk.   Baseline 43.72 sec on 07/05/16   Time 4   Period Weeks   Status Not Met     PT SHORT TERM GOAL #4   Title Pt will perform at least 8 of 10 reps of sit<>stand transfers with minimal UE support, modified independently for improved safety and efficiency with transfers.   Time 4   Period Weeks   Status Achieved     PT SHORT TERM GOAL #5   Title Pt will ambulate at least 200 ft, using RW, with min assistance, for improved safety and efficiency with gait.   Time 4   Period Weeks   Status Achieved     PT SHORT TERM GOAL #6   Title Pt will negotiate one step no handrail, with min assistance, for safe negotiation of step into and out of home.   Time 4   Period Weeks   Status Achieved           PT Long Term Goals - 06/08/16 1627      PT LONG TERM GOAL #1   Title Pt and wife will verbalize understanding of fall prevention in the home environment.  TARGT 08/06/16   Time 8   Period Weeks   Status New     PT LONG TERM GOAL #2   Title Pt will improve TUG score to less than or equal to 20 seconds for decreased fall risk.   Time 8   Period Weeks   Status New     PT LONG TERM GOAL #3   Title Pt will improve gait velocity to at least 2 ft/sec for decreased fall risk and improved gait efficiency and safety.   Time 8   Period Weeks   Status New     PT LONG TERM GOAL #4   Title Pt will improve Berg Balance score by at  least 10 points (from eval) for decreased fall risk.   Time 8   Period Weeks   Status New     PT LONG TERM GOAL #5   Title Pt will ambulate at least 500 ft using RW, with supervision, for improved gait efficiency and safety.   Time 8   Period Weeks   Status New               Plan - 07/07/16 1233    Clinical Impression Statement Pt has met STG 1 and 2.  Pt demo increased tremors in lower extremities this week and overall appears more fatigued.  Pt will continue to benefit from skilled physical therapy to address balance, strength and gait.   Rehab Potential Good   PT Frequency 2x / week   PT Duration 8 weeks  plus eval   PT Treatment/Interventions ADLs/Self Care Home Management;Functional mobility training;Gait training;Therapeutic activities;Therapeutic exercise;Balance training;Neuromuscular re-education;Patient/family education   PT Next Visit Plan Review standing HEP added 07/07/16 visit; continue gait training and strengthening/balance; [place] visit   PT Home Exercise Plan PWR! Moves in sitting, repeated sit<>stand transfers, ant-post and lateral wt-shifts   Consulted and Agree with Plan of Care Patient;Family member/caregiver   Family Member Consulted wife      Patient will benefit from skilled therapeutic intervention in order to improve the following deficits and impairments:  Abnormal gait, Decreased activity tolerance, Decreased balance, Decreased mobility, Decreased strength, Difficulty  walking, Impaired tone, Postural dysfunction  Visit Diagnosis: Muscle weakness (generalized)  Abnormal posture  Unsteadiness on feet     Problem List Patient Active Problem List   Diagnosis Date Noted  . AML (acute myelogenous leukemia) ([place]) 02/20/2015  . Leukocytosis 02/19/2015  . Hepatosplenomegaly 02/19/2015  . Lymphedema: RLE 02/19/2015  . PMR (polymyalgia rheumatica) (H[facility]) 02/19/2015  . Cerebral thrombosis with cerebral infarction ([place]) 10/29/2014  . GERD  (gastroesophageal reflux disease) 10/28/2014  . Diabetes mellitus without complication ([place]) 63/87/5643  . History of GI bleed 10/28/2014  . Vertigo 10/28/2014  . Diarrhea 10/28/2014  . Essential hypertension   . Stroke with cerebral ischemia ([place])   . Acute GI bleeding 04/16/2011  . Skin cancer 04/15/2011  . Thrombocytopenia ([place]) 03/28/2011  . Lymphadenopathy, neck 03/08/2011  . CAD (coronary artery disease) 01/13/2011  . Hypothyroidism 03/29/2010  . SPLENOMEGALY 03/29/2010  . HEPATOMEGALY, HX OF 03/29/2010  . FEVER, HX OF 03/29/2010  . HLD (hyperlipidemia) 09/07/2007  . HYPERTENSION 09/07/2007  . COUGH 09/07/2007    Destin Kittler W. 07/07/2016, 12:38 PM  Frazier Butt., PT  [facility] [address] [place], [place], [address] Phone: [phone]   Fax:  [phone]  Name: KEON PENDER MRN: [id number] Date of Birth: [birth year]

## 2016-07-12 ENCOUNTER — Ambulatory Visit: Payer: Medicare Other | Admitting: Physical Therapy

## 2016-07-12 ENCOUNTER — Encounter: Payer: Self-pay | Admitting: Physical Therapy

## 2016-07-12 DIAGNOSIS — R2681 Unsteadiness on feet: Secondary | ICD-10-CM | POA: Diagnosis not present

## 2016-07-12 DIAGNOSIS — R2689 Other abnormalities of gait and mobility: Secondary | ICD-10-CM

## 2016-07-12 DIAGNOSIS — R293 Abnormal posture: Secondary | ICD-10-CM | POA: Diagnosis not present

## 2016-07-12 DIAGNOSIS — M6281 Muscle weakness (generalized): Secondary | ICD-10-CM

## 2016-07-12 NOTE — Therapy (Signed)
[facility] [address] [place], [place], [address] Phone: [phone]   Fax:  [phone]  Physical Therapy Treatment  Patient Details  Name: Nathan Kemp MRN: [id number] Date of Birth: 11-25-1940 Referring Provider: Wynema Birch  Encounter Date: 07/12/2016      PT End of Session - 07/12/16 0922    Visit Number 10   Number of Visits 17   Date for PT Re-Evaluation 08/06/16   Authorization Type Medicare Primary; BCBS secondary-GCODE every 10th visit   PT Start Time 0801   PT Stop Time 0845   PT Time Calculation (min) 44 min   Equipment Utilized During Treatment Gait belt   Activity Tolerance Patient tolerated treatment well   Behavior During Therapy [facility] for tasks assessed/performed      Past Medical History:  Diagnosis Date  . Acute GI bleeding 04/16/2011  . Anemia   . Anorexia   . Asthma    when  have congstion  of chest  . Blood dyscrasia 06/2009   thrombocytopenia  . Blood transfusion    platelets 12/12  . 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 Coronary artery disease    stent 06  . Cough   . DDD (degenerative disc disease), cervical   . Diabetes mellitus    borderline-controlled by diet  . ED (erectile dysfunction)   . Essential hypertension   . Fatigue   . GERD (gastroesophageal reflux disease)   . Heart murmur   . Hyperlipidemia   . Hypertension   . Hypothyroidism   . IBS (irritable bowel syndrome)   . Leg swelling    due to lymphedema  . Lymphedema: RLE 02/19/2015  . Obesity   . PMR (polymyalgia rheumatica) (HCC) 02/19/2015  . Pneumonia   . Polymyalgia (HCC)   . Thrombocytopenia (HCC)   . Thyroid disease   . Trouble swallowing   . Vitamin D deficiency     Past Surgical History:  Procedure Laterality Date  . APPENDECTOMY  1967  . BONE MARROW BIOPSY     2011-at [facility], 01/2010-at [place], [facility]  . CARDIAC CATHETERIZATION  01/31/2005   EF 60-65%  .  cataract Bilateral   . CORONARY ANGIOPLASTY WITH STENT PLACEMENT  2006   STENTING OF HIS CORONARY ARTERY  . EDG Dilitation  [id number]  . 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 LYMPH NODE BIOPSY  03/15/2011   Procedure: LYMPH NODE BIOPSY;  Surgeon: [name], MD;  Location: [facility];  Service: General;  Laterality: Right;  Right Anterior Cervical Lymph Node Excisional Biopsy   . lymph node removal  2011   right groin  . NASAL SINUS SURGERY  2005  . TONSILLECTOMY AND ADENOIDECTOMY    . US ECHOCARDIOGRAPHY  02/28/2005   EF 55-60%    There were no vitals filed for this visit.      Subjective Assessment - 07/12/16 0802    Subjective Still having more tremors than previously. Denies falls. Wife reports he had very sore muscles after doing leg exercises last visit and did not do them at home.   Patient is accompained by: Family member   Pertinent History Leukemia (AML) dx Nov. 2016 (in remission); LUE intention tremor > 5 years; polymyalgia, DM, CAD, dysarthria   Limitations Walking   Patient Stated Goals Pt's goal for therapy is to improve balance and to walk  again.   Currently in Pain? No/denies                         [facility] Adult PT Treatment/Exercise - 07/12/16 0912      Transfers   Transfers Sit to Stand;Stand to Sit   Sit to Stand 5: Supervision   Sit to Stand Details (indicate cue type and reason) from w/c without UE or light UE support on his knees with excellent control   Stand to Sit 6: Modified independent (Device/Increase time)   Number of Reps Other reps (comment)  4     Ambulation/Gait   Ambulation/Gait Assistance 4: Min guard   Ambulation/Gait Assistance Details vc for proximity to RW, heelstrike   Ambulation Distance (Feet) 232 Feet   Assistive device Rolling walker   Gait Pattern Step-through pattern;Decreased step length - right;Decreased step length - left;Decreased weight shift to right    Ambulation Surface Level;Indoor     Exercises   Exercises Ankle     Knee/Hip Exercises: Standing   Heel Raises Both;1 set;10 reps   Hip Flexion AROM;Both;1 set;20 reps;Stengthening;Knee straight   Hip Abduction AROM;Stengthening;Both;1 set;20 reps;Knee straight   Hip Extension AROM;Stengthening;Right;Left;1 set;20 reps   SLS with Vectors in //bars, touching heel to foam "bubbles" stand on solid surface x 10; stand on blue airex x 20     Ankle Exercises: Standing   Toe Raise 10 reps  // bars             Balance Exercises - 07/12/16 0919      Balance Exercises: Standing   Standing Eyes Opened Narrow base of support (BOS);Foam/compliant surface;3 reps;20 secs;30 secs  // bars, light UE support   Standing Eyes Closed Narrow base of support (BOS);Foam/compliant surface;3 reps;20 secs;30 secs  // bars, light UE support   Tandem Stance Eyes open;Eyes closed;Intermittent upper extremity support;2 reps;30 secs  each leg leading   Balance Beam blue beam horizontal, wide stance , bil UE support //bars x 30 sec x 3           PT Education - 07/12/16 0921    Education provided Yes   Education Details for sore muscles can also use ice (pt tried hot shower previously); including alternating   Person(s) Educated Patient;Spouse   Methods Explanation   Comprehension Verbalized understanding          PT Short Term Goals - 07/07/16 1232      PT SHORT TERM GOAL #1   Title Pt will perform HEP with family supervision for improved posture, strength, gait and balance.  TARGET 07/07/16   Time 4   Period Weeks   Status Achieved     PT SHORT TERM GOAL #2   Title Berg Balance score to be assessed, with goal to be written as appropriate. 2/28 score 32/56; Goal to increase to 37/56 to demonstrate improved balance/safety.    Baseline 38/56 on 07/07/16   Time 4   Period Weeks   Status Achieved     PT SHORT TERM GOAL #3   Title Pt will improve TUG score to less than or equal to 30  seconds for decreased fall risk.   Baseline 43.72 sec on 07/05/16   Time 4   Period Weeks   Status Not Met     PT SHORT TERM GOAL #4   Title Pt will perform at least 8 of 10 reps of sit<>stand transfers with minimal UE support, modified independently for improved safety  and efficiency with transfers.   Time 4   Period Weeks   Status Achieved     PT SHORT TERM GOAL #5   Title Pt will ambulate at least 200 ft, using RW, with min assistance, for improved safety and efficiency with gait.   Time 4   Period Weeks   Status Achieved     PT SHORT TERM GOAL #6   Title Pt will negotiate one step no handrail, with min assistance, for safe negotiation of step into and out of home.   Time 4   Period Weeks   Status Achieved           PT Long Term Goals - 06/08/16 1627      PT LONG TERM GOAL #1   Title Pt and wife will verbalize understanding of fall prevention in the home environment.  TARGT 08/06/16   Time 8   Period Weeks   Status New     PT LONG TERM GOAL #2   Title Pt will improve TUG score to less than or equal to 20 seconds for decreased fall risk.   Time 8   Period Weeks   Status New     PT LONG TERM GOAL #3   Title Pt will improve gait velocity to at least 2 ft/sec for decreased fall risk and improved gait efficiency and safety.   Time 8   Period Weeks   Status New     PT LONG TERM GOAL #4   Title Pt will improve Berg Balance score by at least 10 points (from eval) for decreased fall risk.   Time 8   Period Weeks   Status New     PT LONG TERM GOAL #5   Title Pt will ambulate at least 500 ft using RW, with supervision, for improved gait efficiency and safety.   Time 8   Period Weeks   Status New               Plan - 11-Aug-2016 2482    Clinical Impression Statement Patient has met 5 of 6 short term goals and continues to make progress with strength, balance and safety with RW (all of which were focus of today's session). Patient is very motivated and has a  supportive wife to assist with HEP. Although true diagnosis still unclear, pt has made progress up to this point and feel patient can continue to benefit from PT to work towards [place].   Rehab Potential Good   PT Frequency 2x / week   PT Duration 8 weeks  plus eval   PT Treatment/Interventions ADLs/Self Care Home Management;Functional mobility training;Gait training;Therapeutic activities;Therapeutic exercise;Balance training;Neuromuscular re-education;Patient/family education   PT Next Visit Plan continue gait training and strengthening/balance;    PT Home Exercise Plan PWR! Moves in sitting, repeated sit<>stand transfers, ant-post and lateral wt-shifts   Consulted and Agree with Plan of Care Patient;Family member/caregiver   Family Member Consulted wife      Patient will benefit from skilled therapeutic intervention in order to improve the following deficits and impairments:  Abnormal gait, Decreased activity tolerance, Decreased balance, Decreased mobility, Decreased strength, Difficulty walking, Impaired tone, Postural dysfunction  Visit Diagnosis: Muscle weakness (generalized)  Unsteadiness on feet  Other abnormalities of gait and mobility       G-Codes - 08/11/2016 1545    Functional Assessment Tool Used (Outpatient Only) 07/07/16:   TUG 43.72 sec with RW, Berg Balance 38/56   Functional Limitation Mobility: Walking and moving around  Mobility: Walking and Moving Around Current Status [phone]) At least 60 percent but less than 80 percent impaired, limited or restricted   Mobility: Walking and Moving Around Goal Status [phone]) At least 20 percent but less than 40 percent impaired, limited or restricted      Problem List Patient Active Problem List   Diagnosis Date Noted  . AML (acute myelogenous leukemia) ([place]) 02/20/2015  . Leukocytosis 02/19/2015  . Hepatosplenomegaly 02/19/2015  . Lymphedema: RLE 02/19/2015  . PMR (polymyalgia rheumatica) (HCC) 02/19/2015  . Cerebral  thrombosis with cerebral infarction ([place]) 10/29/2014  . GERD (gastroesophageal reflux disease) 10/28/2014  . Diabetes mellitus without complication ([place]) 42/01/3127  . History of GI bleed 10/28/2014  . Vertigo 10/28/2014  . Diarrhea 10/28/2014  . Essential hypertension   . Stroke with cerebral ischemia ([place])   . Acute GI bleeding 04/16/2011  . Skin cancer 04/15/2011  . Thrombocytopenia ([place]) 03/28/2011  . Lymphadenopathy, neck 03/08/2011  . CAD (coronary artery disease) 01/13/2011  . Hypothyroidism 03/29/2010  . SPLENOMEGALY 03/29/2010  . HEPATOMEGALY, HX OF 03/29/2010  . FEVER, HX OF 03/29/2010  . HLD (hyperlipidemia) 09/07/2007  . HYPERTENSION 09/07/2007  . COUGH 09/07/2007   Physical Therapy Progress Note  Dates of Reporting Period: 06/07/16 to 07/12/16  Objective Reports of Subjective Statement: Pt and wife report he is getting stronger.  Objective Measurements: See G code  Goal Update: NA--continue towards LTGs  Plan: Continue PT POC  Reason Skilled Services are Required: Patient remains high fall risk (TUG >13.5; Berg <45)     Rexanne Mano, PT 07/12/2016, 3:48 PM  [facility] [address] [place], [place], [address] Phone: [phone]   Fax:  [phone]  Name: Nathan Kemp MRN: [id number] Date of Birth: 1940/12/16

## 2016-07-14 ENCOUNTER — Ambulatory Visit: Payer: Medicare Other | Admitting: Physical Therapy

## 2016-07-14 DIAGNOSIS — R2681 Unsteadiness on feet: Secondary | ICD-10-CM

## 2016-07-14 DIAGNOSIS — R2689 Other abnormalities of gait and mobility: Secondary | ICD-10-CM

## 2016-07-14 DIAGNOSIS — M6281 Muscle weakness (generalized): Secondary | ICD-10-CM | POA: Diagnosis not present

## 2016-07-14 DIAGNOSIS — R293 Abnormal posture: Secondary | ICD-10-CM | POA: Diagnosis not present

## 2016-07-14 NOTE — Therapy (Signed)
[facility] [address] [place], [place], [address] Phone: [phone]   Fax:  [phone]  Physical Therapy Treatment  Patient Details  Name: Nathan Kemp MRN: [id number] Date of Birth: 1940-08-02 Referring Provider: Sonia Baller  Encounter Date: 07/14/2016      PT End of Session - 07/14/16 1310    Visit Number 11   Number of Visits 17   Date for PT Re-Evaluation 08/06/16   Authorization Type Medicare Primary; BCBS secondary-GCODE every 10th visit   PT Start Time 1104   PT Stop Time 1149   PT Time Calculation (min) 45 min   Equipment Utilized During Treatment Gait belt   Activity Tolerance Patient tolerated treatment well   Behavior During Therapy [facility] for tasks assessed/performed      Past Medical History:  Diagnosis Date  . Acute GI bleeding 04/16/2011  . Anemia   . Anorexia   . Asthma    when  have congstion  of chest  . Blood dyscrasia 06/2009   thrombocytopenia  . Blood transfusion    platelets 12/12  . 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 Coronary artery disease    stent 06  . Cough   . DDD (degenerative disc disease), cervical   . Diabetes mellitus    borderline-controlled by diet  . ED (erectile dysfunction)   . Essential hypertension   . Fatigue   . GERD (gastroesophageal reflux disease)   . Heart murmur   . Hyperlipidemia   . Hypertension   . Hypothyroidism   . IBS (irritable bowel syndrome)   . Leg swelling    due to lymphedema  . Lymphedema: RLE 02/19/2015  . Obesity   . PMR (polymyalgia rheumatica) ([place]) 02/19/2015  . Pneumonia   . Polymyalgia ([place])   . Thrombocytopenia ([place])   . Thyroid disease   . Trouble swallowing   . Vitamin D deficiency     Past Surgical History:  Procedure Laterality Date  . APPENDECTOMY  1967  . BONE MARROW BIOPSY     2011-at [facility], 01/2010-at [place], [facility]  . CARDIAC CATHETERIZATION  01/31/2005   EF 60-65%  .  cataract Bilateral   . CORONARY ANGIOPLASTY WITH STENT PLACEMENT  2006   STENTING OF HIS CORONARY ARTERY  . EDG Dilitation  [id number]  . 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 LYMPH NODE BIOPSY  03/15/2011   Procedure: LYMPH NODE BIOPSY;  Surgeon: Earnstine [place], MD;  Location: [facility];  Service: General;  Laterality: Right;  Right Anterior Cervical Lymph Node Excisional Biopsy   . lymph node removal  2011   right groin  . NASAL SINUS SURGERY  2005  . TONSILLECTOMY AND ADENOIDECTOMY    . US ECHOCARDIOGRAPHY  02/28/2005   EF 55-60%    There were no vitals filed for this visit.      Subjective Assessment - 07/14/16 1106    Subjective Worked hard the other day.  No pain, no changes   Patient is accompained by: Family member   Pertinent History Leukemia (AML) dx Nov. 2016 (in remission); LUE intention tremor > 5 years; polymyalgia, DM, CAD, dysarthria   Limitations Walking   Patient Stated Goals Pt's goal for therapy is to improve balance and to walk again.   Currently in Pain? No/denies  [place] Adult PT Treatment/Exercise - 07/14/16 1107      Transfers   Transfers Sit to Stand;Stand to Sit   Sit to Stand 5: Supervision;With upper extremity assist;From bed  Attempts without UE support, but pt requires UE support   Stand to Sit 5: Supervision;With upper extremity assist;To bed   Number of Reps 10 reps;1 set  for functional strengthening   Comments Additional reps of sit<>stand with turning to sit several times in session, as brief rest breaks between activities     Ambulation/Gait   Ambulation/Gait Yes   Ambulation/Gait Assistance 4: Min guard   Ambulation/Gait Assistance Details verbal and tactile cues for relaxed shoulders   Ambulation Distance (Feet) 310 Feet  then 50 ft x 4 reps   Assistive device Rolling walker   Gait Pattern Step-through pattern;Decreased step length -  right;Decreased step length - left;Decreased weight shift to right   Ambulation Surface Level;Indoor   Gait Comments Discussed shoewear today-pt wearing rubber sole-loafter type shoes for the first time (versus bedroom slipper shoes), with noted improved foot clearance, decreased shuffling feet with gait.     Knee/Hip Exercises: Standing   Heel Raises Both;2 sets;Other (comment)  15 reps; toe raises x 15 reps with UE support   Hip Flexion AROM;Right;Left;1 set;Other (comment)  12 reps alternating legs   Hip Abduction AROM;Stengthening;Right;Left;1 set  12 reps alternating legs   Hip Extension AROM;Stengthening;Right;Left;1 set;Other (comment)  12 reps alternating legs   Forward Step Up Right;Left;10 reps;Hand Hold: 2;Step Height: 6"  Step up-up/down-down sequence   SLS with Vectors step taps to 6" step, 12" step x 15 reps each with bilateral UE support      Cues for standing activities for improved posture and lessened UE support.  Above standing activities performed alternating legs for improved balance.  Standing lateral weightshifts performed at walker prior to initiating gait, postural cues provided upon standing for relaxed shoulders and upright posture.         PT Education - 07/14/16 1309    Education provided Yes   Education Details Walking program for increased walking activities at home.   Person(s) Educated Patient;Spouse   Methods Explanation;Handout   Comprehension Verbalized understanding          PT Short Term Goals - 07/07/16 1232      PT SHORT TERM GOAL #1   Title Pt will perform HEP with family supervision for improved posture, strength, gait and balance.  TARGET 07/07/16   Time 4   Period Weeks   Status Achieved     PT SHORT TERM GOAL #2   Title Berg Balance score to be assessed, with goal to be written as appropriate. 2/28 score 32/56; Goal to increase to 37/56 to demonstrate improved balance/safety.    Baseline 38/56 on 07/07/16   Time 4   Period  Weeks   Status Achieved     PT SHORT TERM GOAL #3   Title Pt will improve TUG score to less than or equal to 30 seconds for decreased fall risk.   Baseline 43.72 sec on 07/05/16   Time 4   Period Weeks   Status Not Met     PT SHORT TERM GOAL #4   Title Pt will perform at least 8 of 10 reps of sit<>stand transfers with minimal UE support, modified independently for improved safety and efficiency with transfers.   Time 4   Period Weeks   Status Achieved     PT SHORT TERM GOAL #5  Title Pt will ambulate at least 200 ft, using RW, with min assistance, for improved safety and efficiency with gait.   Time 4   Period Weeks   Status Achieved     PT SHORT TERM GOAL #6   Title Pt will negotiate one step no handrail, with min assistance, for safe negotiation of step into and out of home.   Time 4   Period Weeks   Status Achieved           PT Long Term Goals - 06/08/16 1627      PT LONG TERM GOAL #1   Title Pt and wife will verbalize understanding of fall prevention in the home environment.  TARGT 08/06/16   Time 8   Period Weeks   Status New     PT LONG TERM GOAL #2   Title Pt will improve TUG score to less than or equal to 20 seconds for decreased fall risk.   Time 8   Period Weeks   Status New     PT LONG TERM GOAL #3   Title Pt will improve gait velocity to at least 2 ft/sec for decreased fall risk and improved gait efficiency and safety.   Time 8   Period Weeks   Status New     PT LONG TERM GOAL #4   Title Pt will improve Berg Balance score by at least 10 points (from eval) for decreased fall risk.   Time 8   Period Weeks   Status New     PT LONG TERM GOAL #5   Title Pt will ambulate at least 500 ft using RW, with supervision, for improved gait efficiency and safety.   Time 8   Period Weeks   Status New               Plan - 07/14/16 1310    Clinical Impression Statement Focused today on gait and standing balance activities.  Pt is motivated to  improve gait distance both here and at home.  Provided walking program for home.  pt continues to benefit from further skilled PT to address balance, gait and strengthening.   Rehab Potential Good   PT Frequency 2x / week   PT Duration 8 weeks  plus eval   PT Treatment/Interventions ADLs/Self Care Home Management;Functional mobility training;Gait training;Therapeutic activities;Therapeutic exercise;Balance training;Neuromuscular re-education;Patient/family education   PT Next Visit Plan continue gait training and strengthening/balance; follow up on walking program (how long is patient walking at home?)    PT [place]! Moves in sitting, repeated sit<>stand transfers, ant-post and lateral wt-shifts   Consulted and Agree with Plan of Care Patient;Family member/caregiver   Family Member Consulted wife      Patient will benefit from skilled therapeutic intervention in order to improve the following deficits and impairments:  Abnormal gait, Decreased activity tolerance, Decreased balance, Decreased mobility, Decreased strength, Difficulty walking, Impaired tone, Postural dysfunction  Visit Diagnosis: Other abnormalities of gait and mobility  Unsteadiness on feet  Muscle weakness (generalized)     Problem List Patient Active Problem List   Diagnosis Date Noted  . AML (acute myelogenous leukemia) ([place]) 02/20/2015  . Leukocytosis 02/19/2015  . Hepatosplenomegaly 02/19/2015  . Lymphedema: RLE 02/19/2015  . PMR (polymyalgia rheumatica) (HCC) 02/19/2015  . Cerebral thrombosis with cerebral infarction ([place]) 10/29/2014  . GERD (gastroesophageal reflux disease) 10/28/2014  . Diabetes mellitus without complication ([place]) 21/30/8657  . History of GI bleed 10/28/2014  . Vertigo 10/28/2014  . Diarrhea  10/28/2014  . Essential hypertension   . Stroke with cerebral ischemia ([place])   . Acute GI bleeding 04/16/2011  . Skin cancer 04/15/2011  . Thrombocytopenia ([place]) 03/28/2011  .  Lymphadenopathy, neck 03/08/2011  . CAD (coronary artery disease) 01/13/2011  . Hypothyroidism 03/29/2010  . SPLENOMEGALY 03/29/2010  . HEPATOMEGALY, HX OF 03/29/2010  . FEVER, HX OF 03/29/2010  . HLD (hyperlipidemia) 09/07/2007  . HYPERTENSION 09/07/2007  . COUGH 09/07/2007    Shaila Gilchrest W. 07/14/2016, 1:15 PM  Frazier Butt., PT  [facility] [address] [place], [place], [address] Phone: [phone]   Fax:  [phone]  Name: BURWELL BETHEL MRN: [id number] Date of Birth: 1940/06/05

## 2016-07-14 NOTE — Patient Instructions (Signed)
WALKING  Walking is a great form of exercise to increase your strength, endurance and overall fitness.  A walking program can help you start slowly and gradually build endurance as you go.  Everyone's ability is different, so each person's starting point will be different.  You do not have to follow them exactly.  The are just samples. You should simply find out what's right for you and stick to that program.   In the beginning, you'll start off walking 2-3 times a day for short distances.  As you get stronger, you'll be walking further at just 1-2 times per day.  A. You Can Walk For A Certain Length Of Time Each Day    Walk 2 minutes 1-2 times per day.  Increase 1-2 minutes every 3-4 days (1-2 times per day).  Work up to 8-10 minutes (1-2 times per day).   Example:   Day 1-2 2 minutes 1-2 times per day   Day 7-8 4-6 minutes 1-2 times per day   Day 13-14 8-10 minutes 1-2 times per day  B. You Can Walk For a Certain Distance Each Day     Distance can be substituted for time.    Example:   2-3 trips along hallway or loop of rooms in home

## 2016-07-19 ENCOUNTER — Ambulatory Visit: Payer: Medicare Other | Admitting: Physical Therapy

## 2016-07-19 DIAGNOSIS — I2510 Atherosclerotic heart disease of native coronary artery without angina pectoris: Secondary | ICD-10-CM | POA: Diagnosis not present

## 2016-07-19 DIAGNOSIS — D696 Thrombocytopenia, unspecified: Secondary | ICD-10-CM | POA: Diagnosis not present

## 2016-07-19 DIAGNOSIS — E119 Type 2 diabetes mellitus without complications: Secondary | ICD-10-CM | POA: Diagnosis not present

## 2016-07-19 DIAGNOSIS — J45909 Unspecified asthma, uncomplicated: Secondary | ICD-10-CM | POA: Diagnosis not present

## 2016-07-19 DIAGNOSIS — C9201 Acute myeloblastic leukemia, in remission: Secondary | ICD-10-CM | POA: Diagnosis not present

## 2016-07-19 DIAGNOSIS — Z79899 Other long term (current) drug therapy: Secondary | ICD-10-CM | POA: Diagnosis not present

## 2016-07-19 DIAGNOSIS — Z794 Long term (current) use of insulin: Secondary | ICD-10-CM | POA: Diagnosis not present

## 2016-07-19 DIAGNOSIS — Z955 Presence of coronary angioplasty implant and graft: Secondary | ICD-10-CM | POA: Diagnosis not present

## 2016-07-19 DIAGNOSIS — I119 Hypertensive heart disease without heart failure: Secondary | ICD-10-CM | POA: Diagnosis not present

## 2016-07-19 DIAGNOSIS — E876 Hypokalemia: Secondary | ICD-10-CM | POA: Diagnosis not present

## 2016-07-19 DIAGNOSIS — E039 Hypothyroidism, unspecified: Secondary | ICD-10-CM | POA: Diagnosis not present

## 2016-07-20 ENCOUNTER — Ambulatory Visit: Payer: Medicare Other | Attending: Neurology | Admitting: Physical Therapy

## 2016-07-20 DIAGNOSIS — M6281 Muscle weakness (generalized): Secondary | ICD-10-CM

## 2016-07-20 DIAGNOSIS — R2689 Other abnormalities of gait and mobility: Secondary | ICD-10-CM

## 2016-07-20 DIAGNOSIS — R2681 Unsteadiness on feet: Secondary | ICD-10-CM

## 2016-07-20 DIAGNOSIS — R293 Abnormal posture: Secondary | ICD-10-CM | POA: Insufficient documentation

## 2016-07-20 NOTE — Therapy (Signed)
[facility] [address] [place], [place], [address] Phone: [phone]   Fax:  [phone]  Physical Therapy Treatment  Patient Details  Name: Nathan Kemp MRN: [id number] Date of Birth: 1941-04-16 Referring Provider: Sonia Baller  Encounter Date: 07/20/2016      PT End of Session - 07/20/16 1805    Visit Number 12   Number of Visits 17   Date for PT Re-Evaluation 08/06/16   Authorization Type Medicare Primary; BCBS secondary-GCODE every 10th visit   PT Start Time 0807   PT Stop Time 0847   PT Time Calculation (min) 40 min   Equipment Utilized During Treatment Gait belt   Activity Tolerance Patient tolerated treatment well   Behavior During Therapy [facility] for tasks assessed/performed      Past Medical History:  Diagnosis Date  . Acute GI bleeding 04/16/2011  . Anemia   . Anorexia   . Asthma    when  have congstion  of chest  . Blood dyscrasia 06/2009   thrombocytopenia  . Blood transfusion    platelets 12/12  . 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 Coronary artery disease    stent 06  . Cough   . DDD (degenerative disc disease), cervical   . Diabetes mellitus    borderline-controlled by diet  . ED (erectile dysfunction)   . Essential hypertension   . Fatigue   . GERD (gastroesophageal reflux disease)   . Heart murmur   . Hyperlipidemia   . Hypertension   . Hypothyroidism   . IBS (irritable bowel syndrome)   . Leg swelling    due to lymphedema  . Lymphedema: RLE 02/19/2015  . Obesity   . PMR (polymyalgia rheumatica) ([place]) 02/19/2015  . Pneumonia   . Polymyalgia ([place])   . Thrombocytopenia ([place])   . Thyroid disease   . Trouble swallowing   . Vitamin D deficiency     Past Surgical History:  Procedure Laterality Date  . APPENDECTOMY  1967  . BONE MARROW BIOPSY     2011-at [facility], 01/2010-at [place], [facility]/12-[facility]  . CARDIAC CATHETERIZATION  01/31/2005   EF 60-65%  .  cataract Bilateral   . CORONARY ANGIOPLASTY WITH STENT PLACEMENT  2006   STENTING OF HIS CORONARY ARTERY  . EDG Dilitation  [id number]  . 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 LYMPH NODE BIOPSY  03/15/2011   Procedure: LYMPH NODE BIOPSY;  Surgeon: Earnstine Regal, MD;  Location: [facility];  Service: General;  Laterality: Right;  Right Anterior Cervical Lymph Node Excisional Biopsy   . lymph node removal  2011   right groin  . NASAL SINUS SURGERY  2005  . TONSILLECTOMY AND ADENOIDECTOMY    . US ECHOCARDIOGRAPHY  02/28/2005   EF 55-60%    There were no vitals filed for this visit.      Subjective Assessment - 07/20/16 0810    Subjective So sore the other day that I didn't walk much or do too much at home.  Wife reports going to oncologist yesterday with good report.   Patient is accompained by: Family member   Pertinent History Leukemia (AML) dx Nov. 2016 (in remission); LUE intention tremor > 5 years; polymyalgia, DM, CAD, dysarthria   Limitations Walking   Patient Stated Goals Pt's goal for therapy is to improve balance and to walk again.  Currently in Pain? No/denies                         [facility] Adult PT Treatment/Exercise - 07/20/16 0818      Ambulation/Gait   Ambulation/Gait Yes   Ambulation/Gait Assistance 4: Min guard   Ambulation/Gait Assistance Details verbal and tactile cues for relaxed shoulders   Ambulation Distance (Feet) 200 Feet  then 120 ft   Assistive device Rolling walker   Gait Pattern Step-through pattern;Decreased step length - right;Decreased step length - left;Decreased weight shift to right   Ambulation Surface Level;Indoor     Self-Care   Self-Care Other Self-Care Comments   Other Self-Care Comments  PT asks pt what his main functional concerns are in his home/with day to day activities:  getting in and out of shower, stationary standing and initiating gait.  Discussed safety for getting  in and out of shower, including safe, proper UE suppor for balance and need for non-skid mat to increase safety.  Addressed standing and gait initiation in balance activities at counter.  Discussed pt's fatigue, especially since soreness and fatigue after PT sessions are limiting him at home.  Discussed providing rest breaks during sessions and not overdoing activities/pt monitoring activity level during session to lessen post-therapy soreness and fatigue.             Balance Exercises - 07/20/16 0833      Balance Exercises: Standing   Standing Eyes Opened Wide (BOA);Solid surface;Narrow base of support (BOS);Head turns;5 reps  Head nods UE support at counter   Standing Eyes Closed Wide (BOA);Narrow base of support (BOS);Solid surface;3 reps;10 secs  At counter with UE support   Sidestepping Upper extremity support;2 reps  10 ft along length of counter   Step Over Hurdles / Cones Forward step and weigthshift over 2" block x 5 reps each leg with UE support   Other Standing Exercises Lateral weightshifting at counter, x 10 reps; marching in place at [facility] for support x 10 reps; side step over 2" block (to simulate getting in and out of shower) x 10 reps to R, x 10 reps to L, then sidestep to R over obstacle with each foot, then to L with each foot x 5 reps.  Good foot clearance noted.               PT Short Term Goals - 07/07/16 1232      PT SHORT TERM GOAL #1   Title Pt will perform HEP with family supervision for improved posture, strength, gait and balance.  TARGET 07/07/16   Time 4   Period Weeks   Status Achieved     PT SHORT TERM GOAL #2   Title Berg Balance score to be assessed, with goal to be written as appropriate. 2/28 score 32/56; Goal to increase to 37/56 to demonstrate improved balance/safety.    Baseline 38/56 on 07/07/16   Time 4   Period Weeks   Status Achieved     PT SHORT TERM GOAL #3   Title Pt will improve TUG score to less than or equal to 30  seconds for decreased fall risk.   Baseline 43.72 sec on 07/05/16   Time 4   Period Weeks   Status Not Met     PT SHORT TERM GOAL #4   Title Pt will perform at least 8 of 10 reps of sit<>stand transfers with minimal UE support, modified independently for improved safety and efficiency with  transfers.   Time 4   Period Weeks   Status Achieved     PT SHORT TERM GOAL #5   Title Pt will ambulate at least 200 ft, using RW, with min assistance, for improved safety and efficiency with gait.   Time 4   Period Weeks   Status Achieved     PT SHORT TERM GOAL #6   Title Pt will negotiate one step no handrail, with min assistance, for safe negotiation of step into and out of home.   Time 4   Period Weeks   Status Achieved           PT Long Term Goals - 06/08/16 1627      PT LONG TERM GOAL #1   Title Pt and wife will verbalize understanding of fall prevention in the home environment.  TARGT 08/06/16   Time 8   Period Weeks   Status New     PT LONG TERM GOAL #2   Title Pt will improve TUG score to less than or equal to 20 seconds for decreased fall risk.   Time 8   Period Weeks   Status New     PT LONG TERM GOAL #3   Title Pt will improve gait velocity to at least 2 ft/sec for decreased fall risk and improved gait efficiency and safety.   Time 8   Period Weeks   Status New     PT LONG TERM GOAL #4   Title Pt will improve Berg Balance score by at least 10 points (from eval) for decreased fall risk.   Time 8   Period Weeks   Status New     PT LONG TERM GOAL #5   Title Pt will ambulate at least 500 ft using RW, with supervision, for improved gait efficiency and safety.   Time 8   Period Weeks   Status New               Plan - 07/20/16 1806    Clinical Impression Statement Pt is motivated for and participates well in therapy activities; however, fatigue/soreness after therapy sessions is limiting exercise/walking at home per pt report.  Will need to continue to  monitor and adjust activities as able, to avoid excess fatigue.     Rehab Potential Good   PT Frequency 2x / week   PT Duration 8 weeks  plus eval   PT Treatment/Interventions ADLs/Self Care Home Management;Functional mobility training;Gait training;Therapeutic activities;Therapeutic exercise;Balance training;Neuromuscular re-education;Patient/family education   PT Next Visit Plan continue to work on gait training and balance activities; follow up on walking program (how long is patient walking at home?) Consider having patient rate perceived exertion of sessions   PT [place]! Moves in sitting, repeated sit<>stand transfers, ant-post and lateral wt-shifts   Consulted and Agree with Plan of Care Patient;Family member/caregiver   Family Member Consulted wife      Patient will benefit from skilled therapeutic intervention in order to improve the following deficits and impairments:  Abnormal gait, Decreased activity tolerance, Decreased balance, Decreased mobility, Decreased strength, Difficulty walking, Impaired tone, Postural dysfunction  Visit Diagnosis: Other abnormalities of gait and mobility  Unsteadiness on feet  Muscle weakness (generalized)     Problem List Patient Active Problem List   Diagnosis Date Noted  . AML (acute myelogenous leukemia) ([place]) 02/20/2015  . Leukocytosis 02/19/2015  . Hepatosplenomegaly 02/19/2015  . Lymphedema: RLE 02/19/2015  . PMR (polymyalgia rheumatica) (HCC) 02/19/2015  . Cerebral thrombosis  with cerebral infarction ([place]) 10/29/2014  . GERD (gastroesophageal reflux disease) 10/28/2014  . Diabetes mellitus without complication ([place]) 03/47/4259  . History of GI bleed 10/28/2014  . Vertigo 10/28/2014  . Diarrhea 10/28/2014  . Essential hypertension   . Stroke with cerebral ischemia ([place])   . Acute GI bleeding 04/16/2011  . Skin cancer 04/15/2011  . Thrombocytopenia ([place]) 03/28/2011  . Lymphadenopathy, neck 03/08/2011  . CAD  (coronary artery disease) 01/13/2011  . Hypothyroidism 03/29/2010  . SPLENOMEGALY 03/29/2010  . HEPATOMEGALY, HX OF 03/29/2010  . FEVER, HX OF 03/29/2010  . HLD (hyperlipidemia) 09/07/2007  . HYPERTENSION 09/07/2007  . COUGH 09/07/2007    Tiasia Weberg W. 07/20/2016, 6:11 PM Frazier Butt., PT [facility] [address] [place], [place], [address] Phone: [phone]   Fax:  [phone]  Name: Nathan Kemp MRN: [id number] Date of Birth: 23-Apr-1940

## 2016-07-21 ENCOUNTER — Ambulatory Visit: Payer: Medicare Other | Admitting: Physical Therapy

## 2016-07-22 ENCOUNTER — Encounter: Payer: Self-pay | Admitting: Physical Therapy

## 2016-07-22 ENCOUNTER — Ambulatory Visit: Payer: Medicare Other | Admitting: Physical Therapy

## 2016-07-22 DIAGNOSIS — R2689 Other abnormalities of gait and mobility: Secondary | ICD-10-CM | POA: Diagnosis not present

## 2016-07-22 DIAGNOSIS — R293 Abnormal posture: Secondary | ICD-10-CM | POA: Diagnosis not present

## 2016-07-22 DIAGNOSIS — R2681 Unsteadiness on feet: Secondary | ICD-10-CM | POA: Diagnosis not present

## 2016-07-22 DIAGNOSIS — M6281 Muscle weakness (generalized): Secondary | ICD-10-CM | POA: Diagnosis not present

## 2016-07-22 NOTE — Therapy (Signed)
[facility] [address] [place], [place], [address] Phone: [phone]   Fax:  [phone]  Physical Therapy Treatment  Patient Details  Name: Nathan Kemp MRN: [id number] Date of Birth: 1940-09-07 Referring Provider: Sonia Baller  Encounter Date: 07/22/2016      PT End of Session - 07/22/16 1446    Visit Number 13   Number of Visits 17   Date for PT Re-Evaluation 08/06/16   Authorization Type Medicare Primary; BCBS secondary-GCODE every 10th visit   PT Start Time 0802   PT Stop Time 0847   PT Time Calculation (min) 45 min   Equipment Utilized During Treatment Gait belt   Activity Tolerance Patient tolerated treatment well   Behavior During Therapy [facility] for tasks assessed/performed      Past Medical History:  Diagnosis Date  . Acute GI bleeding 04/16/2011  . Anemia   . Anorexia   . Asthma    when  have congstion  of chest  . Blood dyscrasia 06/2009   thrombocytopenia  . Blood transfusion    platelets 12/12  . 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 Coronary artery disease    stent 06  . Cough   . DDD (degenerative disc disease), cervical   . Diabetes mellitus    borderline-controlled by diet  . ED (erectile dysfunction)   . Essential hypertension   . Fatigue   . GERD (gastroesophageal reflux disease)   . Heart murmur   . Hyperlipidemia   . Hypertension   . Hypothyroidism   . IBS (irritable bowel syndrome)   . Leg swelling    due to lymphedema  . Lymphedema: RLE 02/19/2015  . Obesity   . PMR (polymyalgia rheumatica) ([place]) 02/19/2015  . Pneumonia   . Polymyalgia ([place])   . Thrombocytopenia ([place])   . Thyroid disease   . Trouble swallowing   . Vitamin D deficiency     Past Surgical History:  Procedure Laterality Date  . APPENDECTOMY  1967  . BONE MARROW BIOPSY     2011-at [facility], 01/2010-at [facility], [facility]  . CARDIAC CATHETERIZATION  01/31/2005   EF 60-65%  .  cataract Bilateral   . CORONARY ANGIOPLASTY WITH STENT PLACEMENT  2006   STENTING OF HIS CORONARY ARTERY  . EDG Dilitation  [id number]  . 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 LYMPH NODE BIOPSY  03/15/2011   Procedure: LYMPH NODE BIOPSY;  Surgeon: Earnstine Regal, MD;  Location: [facility];  Service: General;  Laterality: Right;  Right Anterior Cervical Lymph Node Excisional Biopsy   . lymph node removal  2011   right groin  . NASAL SINUS SURGERY  2005  . TONSILLECTOMY AND ADENOIDECTOMY    . US ECHOCARDIOGRAPHY  02/28/2005   EF 55-60%    There were no vitals filed for this visit.      Subjective Assessment - 07/22/16 0800    Subjective Felt better after last session. Walking 10 minutes at a time.    Patient is accompained by: Family member   Pertinent History Leukemia (AML) dx Nov. 2016 (in remission); LUE intention tremor > 5 years; polymyalgia, DM, CAD, dysarthria   Limitations Walking   Patient Stated Goals Pt's goal for therapy is to improve balance and to walk again.   Currently in Pain? No/denies  [facility] Adult PT Treatment/Exercise - 07/22/16 0001      Transfers   Transfers Sit to Stand;Stand to Sit   Sit to Stand 4: Min guard;4: Min assist   Sit to Stand Details (indicate cue type and reason) assist from chair without armrests   Stand to Sit 5: Supervision   Number of Reps --  4     Ambulation/Gait   Ambulation/Gait Assistance 4: Min guard   Ambulation/Gait Assistance Details legs with incr tremors this date; incr difficulty intiating stepping and required cues to use wt-shifting to facilitate stepping   Ambulation Distance (Feet) 160 Feet   Assistive device Rolling walker   Gait Pattern Step-through pattern;Decreased step length - right;Decreased step length - left;Decreased weight shift to right   Ambulation Surface Level;Indoor     Exercises   Exercises Other Exercises   Other  Exercises  RPE after walk one lap and wt shift at countertop = 3 moderate; after standing PWR at counter x 5 reps of up and rock =4 somewhat heavy; after seated PWR up, rock, twist =5 heavy.      Knee/Hip Exercises: Standing   Other Standing Knee Exercises at counter wt shifting ant/post x 5 each leg; laterally x 5 each leg           PWR [facility]) - 07/22/16 1440    PWR! Up x5 holding counter   PWR! Rock x 5 each side at counter   Comments attempted PWR standing at counter with pt quickly fatguing   PWR! Up x 10   PWR! Rock x5 each side   PWR! Twist x 5 each side   Comments vc for technique             PT Education - 07/22/16 1445    Education provided Yes   Education Details Reviewed walking program and progression; reviewed briefly fall prevention and provided written information   Person(s) Educated Patient;Spouse   Methods Explanation;Handout   Comprehension Verbalized understanding          PT Short Term Goals - 07/07/16 1232      PT SHORT TERM GOAL #1   Title Pt will perform HEP with family supervision for improved posture, strength, gait and balance.  TARGET 07/07/16   Time 4   Period Weeks   Status Achieved     PT SHORT TERM GOAL #2   Title Berg Balance score to be assessed, with goal to be written as appropriate. 2/28 score 32/56; Goal to increase to 37/56 to demonstrate improved balance/safety.    Baseline 38/56 on 07/07/16   Time 4   Period Weeks   Status Achieved     PT SHORT TERM GOAL #3   Title Pt will improve TUG score to less than or equal to 30 seconds for decreased fall risk.   Baseline 43.72 sec on 07/05/16   Time 4   Period Weeks   Status Not Met     PT SHORT TERM GOAL #4   Title Pt will perform at least 8 of 10 reps of sit<>stand transfers with minimal UE support, modified independently for improved safety and efficiency with transfers.   Time 4   Period Weeks   Status Achieved     PT SHORT TERM GOAL #5   Title Pt will ambulate at least  200 ft, using RW, with min assistance, for improved safety and efficiency with gait.   Time 4   Period Weeks   Status Achieved  PT SHORT TERM GOAL #6   Title Pt will negotiate one step no handrail, with min assistance, for safe negotiation of step into and out of home.   Time 4   Period Weeks   Status Achieved           PT Long Term Goals - 06/08/16 1627      PT LONG TERM GOAL #1   Title Pt and wife will verbalize understanding of fall prevention in the home environment.  TARGT 08/06/16   Time 8   Period Weeks   Status New     PT LONG TERM GOAL #2   Title Pt will improve TUG score to less than or equal to 20 seconds for decreased fall risk.   Time 8   Period Weeks   Status New     PT LONG TERM GOAL #3   Title Pt will improve gait velocity to at least 2 ft/sec for decreased fall risk and improved gait efficiency and safety.   Time 8   Period Weeks   Status New     PT LONG TERM GOAL #4   Title Pt will improve Berg Balance score by at least 10 points (from eval) for decreased fall risk.   Time 8   Period Weeks   Status New     PT LONG TERM GOAL #5   Title Pt will ambulate at least 500 ft using RW, with supervision, for improved gait efficiency and safety.   Time 8   Period Weeks   Status New               Plan - 07/22/16 1447    Clinical Impression Statement Session focused on gait training, safe use of RW, balance and strength training. Patient demonstrating incr tremors and provided incr rest breaks for safety and pt comfort. Utilized RPE scale to better monitor patient's fatigue level during sessions.    Rehab Potential Good   PT Frequency 2x / week   PT Duration 8 weeks  plus eval   PT Treatment/Interventions ADLs/Self Care Home Management;Functional mobility training;Gait training;Therapeutic activities;Therapeutic exercise;Balance training;Neuromuscular re-education;Patient/family education   PT Next Visit Plan continue to work on gait training and  balance activities; having patient rate perceived exertion of sessions (I just pulled it up on internet to show him)   PT [place]! Moves in sitting, repeated sit<>stand transfers, ant-post and lateral wt-shifts   Consulted and Agree with Plan of Care Patient;Family member/caregiver   Family Member Consulted wife      Patient will benefit from skilled therapeutic intervention in order to improve the following deficits and impairments:  Abnormal gait, Decreased activity tolerance, Decreased balance, Decreased mobility, Decreased strength, Difficulty walking, Impaired tone, Postural dysfunction  Visit Diagnosis: Other abnormalities of gait and mobility  Unsteadiness on feet  Muscle weakness (generalized)  Abnormal posture     Problem List Patient Active Problem List   Diagnosis Date Noted  . AML (acute myelogenous leukemia) ([place]) 02/20/2015  . Leukocytosis 02/19/2015  . Hepatosplenomegaly 02/19/2015  . Lymphedema: RLE 02/19/2015  . PMR (polymyalgia rheumatica) (HCC) 02/19/2015  . Cerebral thrombosis with cerebral infarction ([place]) 10/29/2014  . GERD (gastroesophageal reflux disease) 10/28/2014  . Diabetes mellitus without complication ([place]) 70/92/9574  . History of GI bleed 10/28/2014  . Vertigo 10/28/2014  . Diarrhea 10/28/2014  . Essential hypertension   . Stroke with cerebral ischemia ([place])   . Acute GI bleeding 04/16/2011  . Skin cancer 04/15/2011  . Thrombocytopenia (  [place]) 03/28/2011  . Lymphadenopathy, neck 03/08/2011  . CAD (coronary artery disease) 01/13/2011  . Hypothyroidism 03/29/2010  . SPLENOMEGALY 03/29/2010  . HEPATOMEGALY, HX OF 03/29/2010  . FEVER, HX OF 03/29/2010  . HLD (hyperlipidemia) 09/07/2007  . HYPERTENSION 09/07/2007  . COUGH 09/07/2007    Rexanne Mano, PT 07/22/2016, 2:52 PM  [facility] [address] [place], [place], [address] Phone: [phone]   Fax:   [phone]  Name: Nathan Kemp MRN: [id number] Date of Birth: November 25, 1940

## 2016-07-22 NOTE — Patient Instructions (Addendum)
Fall Prevention in the Home Falls can cause injuries and can affect people from all age groups. There are many simple things that you can do to make your home safe and to help prevent falls. What can I do on the outside of my home?  Regularly repair the edges of walkways and driveways and fix any cracks.  Remove high doorway thresholds.  Trim any shrubbery on the main path into your home.  Use bright outdoor lighting.  Clear walkways of debris and clutter, including tools and rocks.  Regularly check that handrails are securely fastened and in good repair. Both sides of any steps should have handrails.  Install guardrails along the edges of any raised decks or porches.  Have leaves, snow, and ice cleared regularly.  Use sand or salt on walkways during winter months.  In the garage, clean up any spills right away, including grease or oil spills. What can I do in the bathroom?  Use night lights.  Install grab bars by the toilet and in the tub and shower. Do not use towel bars as grab bars.  Use non-skid mats or decals on the floor of the tub or shower.  If you need to sit down while you are in the shower, use a plastic, non-slip stool.  Keep the floor dry. Immediately clean up any water that spills on the floor.  Remove soap buildup in the tub or shower on a regular basis.  Attach bath mats securely with double-sided non-slip rug tape.  Remove throw rugs and other tripping hazards from the floor. What can I do in the bedroom?  Use night lights.  Make sure that a bedside light is easy to reach.  Do not use oversized bedding that drapes onto the floor.  Have a firm chair that has side arms to use for getting dressed.  Remove throw rugs and other tripping hazards from the floor. What can I do in the kitchen?  Clean up any spills right away.  Avoid walking on wet floors.  Place frequently used items in easy-to-reach places.  If you need to reach for something above  you, use a sturdy step stool that has a grab bar.  Keep electrical cables out of the way.  Do not use floor polish or wax that makes floors slippery. If you have to use wax, make sure that it is non-skid floor wax.  Remove throw rugs and other tripping hazards from the floor. What can I do in the stairways?  Do not leave any items on the stairs.  Make sure that there are handrails on both sides of the stairs. Fix handrails that are broken or loose. Make sure that handrails are as long as the stairways.  Check any carpeting to make sure that it is firmly attached to the stairs. Fix any carpet that is loose or worn.  Avoid having throw rugs at the top or bottom of stairways, or secure the rugs with carpet tape to prevent them from moving.  Make sure that you have a light switch at the top of the stairs and the bottom of the stairs. If you do not have them, have them installed. What are some other fall prevention tips?  Wear closed-toe shoes that fit well and support your feet. Wear shoes that have rubber soles or low heels.  When you use a stepladder, make sure that it is completely opened and that the sides are firmly locked. Have someone hold the ladder while you are using   it. Do not climb a closed stepladder.  Add color or contrast paint or tape to grab bars and handrails in your home. Place contrasting color strips on the first and last steps.  Use mobility aids as needed, such as canes, walkers, scooters, and crutches.  Turn on lights if it is dark. Replace any light bulbs that burn out.  Set up furniture so that there are clear paths. Keep the furniture in the same spot.  Fix any uneven floor surfaces.  Choose a carpet design that does not hide the edge of steps of a stairway.  Be aware of any and all pets.  Review your medicines with your healthcare provider. Some medicines can cause dizziness or changes in blood pressure, which increase your risk of falling. Talk with  your health care provider about other ways that you can decrease your risk of falls. This may include working with a physical therapist or trainer to improve your strength, balance, and endurance. This information is not intended to replace advice given to you by your health care provider. Make sure you discuss any questions you have with your health care provider. Document Released: 03/25/2002 Document Revised: 09/01/2015 Document Reviewed: 05/09/2014 Elsevier Interactive Patient Education  2017 [employer]  

## 2016-07-25 ENCOUNTER — Encounter: Payer: Self-pay | Admitting: Neurology

## 2016-07-26 ENCOUNTER — Ambulatory Visit: Payer: Medicare Other | Admitting: Physical Therapy

## 2016-07-26 DIAGNOSIS — R293 Abnormal posture: Secondary | ICD-10-CM | POA: Diagnosis not present

## 2016-07-26 DIAGNOSIS — R2689 Other abnormalities of gait and mobility: Secondary | ICD-10-CM

## 2016-07-26 DIAGNOSIS — R2681 Unsteadiness on feet: Secondary | ICD-10-CM

## 2016-07-26 DIAGNOSIS — M6281 Muscle weakness (generalized): Secondary | ICD-10-CM | POA: Diagnosis not present

## 2016-07-26 NOTE — Therapy (Signed)
[facility] [address] [place], [place], [address] Phone: [phone]   Fax:  [phone]  Physical Therapy Treatment  Patient Details  Name: Nathan Kemp MRN: [id number] Date of Birth: 1940/10/15 Referring Provider: Sonia Baller  Encounter Date: 07/26/2016      PT End of Session - 07/26/16 1313    Visit Number 14   Number of Visits 17   Date for PT Re-Evaluation 08/06/16   Authorization Type Medicare Primary; BCBS secondary-GCODE every 10th visit   PT Start Time 0933   PT Stop Time 1015   PT Time Calculation (min) 42 min   Equipment Utilized During Treatment Gait belt   Activity Tolerance Patient tolerated treatment well   Behavior During Therapy [facility] for tasks assessed/performed      Past Medical History:  Diagnosis Date  . Acute GI bleeding 04/16/2011  . Anemia   . Anorexia   . Asthma    when  have congstion  of chest  . Blood dyscrasia 06/2009   thrombocytopenia  . Blood transfusion    platelets 12/12  . 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 Coronary artery disease    stent 06  . Cough   . DDD (degenerative disc disease), cervical   . Diabetes mellitus    borderline-controlled by diet  . ED (erectile dysfunction)   . Essential hypertension   . Fatigue   . GERD (gastroesophageal reflux disease)   . Heart murmur   . Hyperlipidemia   . Hypertension   . Hypothyroidism   . IBS (irritable bowel syndrome)   . Leg swelling    due to lymphedema  . Lymphedema: RLE 02/19/2015  . Obesity   . PMR (polymyalgia rheumatica) ([place]) 02/19/2015  . Pneumonia   . Polymyalgia ([place])   . Thrombocytopenia ([place])   . Thyroid disease   . Trouble swallowing   . Vitamin D deficiency     Past Surgical History:  Procedure Laterality Date  . APPENDECTOMY  1967  . BONE MARROW BIOPSY     2011-at [facility], 01/2010-at [facility], [facility]/12-[facility]  . CARDIAC CATHETERIZATION  01/31/2005   EF 60-65%  .  cataract Bilateral   . CORONARY ANGIOPLASTY WITH STENT PLACEMENT  2006   STENTING OF HIS CORONARY ARTERY  . EDG Dilitation  [id number]  . 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 LYMPH NODE BIOPSY  03/15/2011   Procedure: LYMPH NODE BIOPSY;  Surgeon: Earnstine Regal, MD;  Location: [facility]S;  Service: General;  Laterality: Right;  Right Anterior Cervical Lymph Node Excisional Biopsy   . lymph node removal  2011   right groin  . NASAL SINUS SURGERY  2005  . TONSILLECTOMY AND ADENOIDECTOMY    . US ECHOCARDIOGRAPHY  02/28/2005   EF 55-60%    There were no vitals filed for this visit.      Subjective Assessment - 07/26/16 0935    Subjective Had a lot of shaking last visit; he thought he took his medications, but he had actually dropped the [place] and didn't take.  That's what made the shaking worse.   Patient is accompained by: Family member   Pertinent History Leukemia (AML) dx Nov. 2016 (in remission); LUE intention tremor > 5 years; polymyalgia, DM, CAD, dysarthria   Limitations Walking   Patient Stated Goals Pt's goal for therapy is to improve balance and  to walk again.   Currently in Pain? No/denies                         [facility] Adult PT Treatment/Exercise - 07/26/16 0936      Transfers   Transfers Sit to Stand;Stand to Sit   Sit to Stand 5: Supervision;With upper extremity assist;From bed;From chair/3-in-1   Stand to Sit 5: Supervision;With upper extremity assist;To bed;To chair/3-in-1     Ambulation/Gait   Ambulation/Gait Yes   Ambulation/Gait Assistance 4: Min guard   Ambulation/Gait Assistance Details Worked on environmental scanning tasks with gait using RW   Ambulation Distance (Feet) 400 Feet  then at least 250 ft negotiating furniture, turns, carpets   Assistive device Rolling walker   Gait Pattern Step-through pattern;Decreased step length - right;Decreased step length - left;Decreased weight shift to  right   Ambulation Surface Level;Indoor   Gait Comments Rates RPE as easy after 400 ft of gait; short distance gait 50 ft x 2, then 20 ft x 2 with RW; slight increase in tremors noted with turns to prepare to sit.             Balance Exercises - 07/26/16 1309      Balance Exercises: Standing   Standing Eyes Opened Wide (BOA);Narrow base of support (BOS);Solid surface;Head turns  Head nods, 5 reps each   Standing Eyes Closed Wide (BOA);Narrow base of support (BOS);Solid surface;1 rep;10 secs   SLS Eyes open;Upper extremity support 1  Alternating step taps x 10 reps, 2 sets at 6" step   Stepping Strategy Anterior;Lateral;UE support;10 reps  stepping over balance beam (2")   Step Ups Forward;6 inch;UE support 2  5 reps bilateral   Other Standing Exercises Lateral weightshifting at walker and at counter, prior to gait initiation.     Rated balance work as "somewhat hard" after standing balance activities.  Required 2 brief rest breaks during standing activities.        PT Short Term Goals - 07/07/16 1232      PT SHORT TERM GOAL #1   Title Pt will perform HEP with family supervision for improved posture, strength, gait and balance.  TARGET 07/07/16   Time 4   Period Weeks   Status Achieved     PT SHORT TERM GOAL #2   Title Berg Balance score to be assessed, with goal to be written as appropriate. 2/28 score 32/56; Goal to increase to 37/56 to demonstrate improved balance/safety.    Baseline 38/56 on 07/07/16   Time 4   Period Weeks   Status Achieved     PT SHORT TERM GOAL #3   Title Pt will improve TUG score to less than or equal to 30 seconds for decreased fall risk.   Baseline 43.72 sec on 07/05/16   Time 4   Period Weeks   Status Not Met     PT SHORT TERM GOAL #4   Title Pt will perform at least 8 of 10 reps of sit<>stand transfers with minimal UE support, modified independently for improved safety and efficiency with transfers.   Time 4   Period Weeks   Status  Achieved     PT SHORT TERM GOAL #5   Title Pt will ambulate at least 200 ft, using RW, with min assistance, for improved safety and efficiency with gait.   Time 4   Period Weeks   Status Achieved     PT SHORT TERM GOAL #6  Title Pt will negotiate one step no handrail, with min assistance, for safe negotiation of step into and out of home.   Time 4   Period Weeks   Status Achieved           PT Long Term Goals - 06/08/16 1627      PT LONG TERM GOAL #1   Title Pt and wife will verbalize understanding of fall prevention in the home environment.  TARGT 08/06/16   Time 8   Period Weeks   Status New     PT LONG TERM GOAL #2   Title Pt will improve TUG score to less than or equal to 20 seconds for decreased fall risk.   Time 8   Period Weeks   Status New     PT LONG TERM GOAL #3   Title Pt will improve gait velocity to at least 2 ft/sec for decreased fall risk and improved gait efficiency and safety.   Time 8   Period Weeks   Status New     PT LONG TERM GOAL #4   Title Pt will improve Berg Balance score by at least 10 points (from eval) for decreased fall risk.   Time 8   Period Weeks   Status New     PT LONG TERM GOAL #5   Title Pt will ambulate at least 500 ft using RW, with supervision, for improved gait efficiency and safety.   Time 8   Period Weeks   Status New               Plan - 07/26/16 1313    Clinical Impression Statement Focused session today on gait training, including gait with turns and environmental scanning, as well as balance.  Pt appears to have improved tolerance for activity today with lessened tremors.  Rates exertion as easy to somewhat hard during gait and standing balance activities.   Rehab Potential Good   PT Frequency 2x / week   PT Duration 8 weeks  plus eval   PT Treatment/Interventions ADLs/Self Care Home Management;Functional mobility training;Gait training;Therapeutic activities;Therapeutic exercise;Balance  training;Neuromuscular re-education;Patient/family education   PT Next Visit Plan continue to work on gait training and balance activities; having patient rate perceived exertion of sessions   PT Home Exercise Plan PWR! Moves in sitting, repeated sit<>stand transfers, ant-post and lateral wt-shifts   Consulted and Agree with Plan of Care Patient;Family member/caregiver   Family Member Consulted wife      Patient will benefit from skilled therapeutic intervention in order to improve the following deficits and impairments:  Abnormal gait, Decreased activity tolerance, Decreased balance, Decreased mobility, Decreased strength, Difficulty walking, Impaired tone, Postural dysfunction  Visit Diagnosis: Other abnormalities of gait and mobility  Unsteadiness on feet     Problem List Patient Active Problem List   Diagnosis Date Noted  . AML (acute myelogenous leukemia) ([place]) 02/20/2015  . Leukocytosis 02/19/2015  . Hepatosplenomegaly 02/19/2015  . Lymphedema: RLE 02/19/2015  . PMR (polymyalgia rheumatica) (HCC) 02/19/2015  . Cerebral thrombosis with cerebral infarction ([place]) 10/29/2014  . GERD (gastroesophageal reflux disease) 10/28/2014  . Diabetes mellitus without complication ([place]) 32/35/5732  . History of GI bleed 10/28/2014  . Vertigo 10/28/2014  . Diarrhea 10/28/2014  . Essential hypertension   . Stroke with cerebral ischemia ([place])   . Acute GI bleeding 04/16/2011  . Skin cancer 04/15/2011  . Thrombocytopenia ([place]) 03/28/2011  . Lymphadenopathy, neck 03/08/2011  . CAD (coronary artery disease) 01/13/2011  . Hypothyroidism 03/29/2010  .  SPLENOMEGALY 03/29/2010  . HEPATOMEGALY, HX OF 03/29/2010  . FEVER, HX OF 03/29/2010  . HLD (hyperlipidemia) 09/07/2007  . HYPERTENSION 09/07/2007  . COUGH 09/07/2007    MARRIOTT,AMY W. 07/26/2016, 2:39 PM  Frazier Butt., PT  [facility] [address] [place], [place],  [address] Phone: [phone]   Fax:  [phone]  Name: Nathan Kemp MRN: [id number] Date of Birth: 02-04-1941

## 2016-07-28 ENCOUNTER — Ambulatory Visit: Payer: Medicare Other | Admitting: Physical Therapy

## 2016-07-28 DIAGNOSIS — M6281 Muscle weakness (generalized): Secondary | ICD-10-CM | POA: Diagnosis not present

## 2016-07-28 DIAGNOSIS — R293 Abnormal posture: Secondary | ICD-10-CM | POA: Diagnosis not present

## 2016-07-28 DIAGNOSIS — R2689 Other abnormalities of gait and mobility: Secondary | ICD-10-CM

## 2016-07-28 DIAGNOSIS — R2681 Unsteadiness on feet: Secondary | ICD-10-CM | POA: Diagnosis not present

## 2016-07-28 NOTE — Therapy (Signed)
[facility] [address] [place], [place], [address] Phone: [phone]   Fax:  [phone]  Physical Therapy Treatment  Patient Details  Name: Nathan Kemp MRN: [id number] Date of Birth: [birth year] Referring Provider: Sonia Baller  Encounter Date: 07/28/2016      PT End of Session - 07/28/16 2122    Visit Number 15   Number of Visits 17   Date for PT Re-Evaluation 08/06/16   Authorization Type Medicare Primary; BCBS secondary-GCODE every 10th visit   PT Start Time 0850   PT Stop Time 0932   PT Time Calculation (min) 42 min   Equipment Utilized During Treatment Gait belt   Activity Tolerance Patient tolerated treatment well   Behavior During Therapy [facility] for tasks assessed/performed      Past Medical History:  Diagnosis Date  . Acute GI bleeding 04/16/2011  . Anemia   . Anorexia   . Asthma    when  have congstion  of chest  . Blood dyscrasia 06/2009   thrombocytopenia  . Blood transfusion    platelets 12/12  . 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 Coronary artery disease    stent 06  . Cough   . DDD (degenerative disc disease), cervical   . Diabetes mellitus    borderline-controlled by diet  . ED (erectile dysfunction)   . Essential hypertension   . Fatigue   . GERD (gastroesophageal reflux disease)   . Heart murmur   . Hyperlipidemia   . Hypertension   . Hypothyroidism   . IBS (irritable bowel syndrome)   . Leg swelling    due to lymphedema  . Lymphedema: RLE 02/19/2015  . Obesity   . PMR (polymyalgia rheumatica) ([place]) 02/19/2015  . Pneumonia   . Polymyalgia ([place])   . Thrombocytopenia ([place])   . Thyroid disease   . Trouble swallowing   . Vitamin D deficiency     Past Surgical History:  Procedure Laterality Date  . APPENDECTOMY  1967  . BONE MARROW BIOPSY     2011-at [facility], 01/2010-at [facility], [facility]/12-[facility]  . CARDIAC CATHETERIZATION  01/31/2005   EF 60-65%  .  cataract Bilateral   . CORONARY ANGIOPLASTY WITH STENT PLACEMENT  2006   STENTING OF HIS CORONARY ARTERY  . EDG Dilitation  [id number]  . 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 LYMPH NODE BIOPSY  03/15/2011   Procedure: LYMPH NODE BIOPSY;  Surgeon: Earnstine Regal, MD;  Location: [facility];  Service: General;  Laterality: Right;  Right Anterior Cervical Lymph Node Excisional Biopsy   . lymph node removal  2011   right groin  . NASAL SINUS SURGERY  2005  . TONSILLECTOMY AND ADENOIDECTOMY    . US ECHOCARDIOGRAPHY  02/28/2005   EF 55-60%    There were no vitals filed for this visit.      Subjective Assessment - 07/28/16 0853    Subjective Shaking and tremors are bad this morning-unsure why.  Had a busy day yesterday going to visit grandchildren.   Patient is accompained by: Family member   Pertinent History Leukemia (AML) dx Nov. 2016 (in remission); LUE intention tremor > 5 years; polymyalgia, DM, CAD, dysarthria   Limitations Walking   Patient Stated Goals Pt's goal for therapy is to improve balance and to walk again.   Currently in Pain? No/denies  [facility] Adult PT Treatment/Exercise - 07/28/16 0909      Transfers   Transfers Sit to Stand;Stand to Sit   Sit to Stand 5: Supervision;With upper extremity assist  From 22" mat surface   Stand to Sit 5: Supervision   Number of Reps 10 reps  see below for functional strengthening   Comments Minimal UE support for sit<>stand     Ambulation/Gait   Ambulation/Gait Yes   Ambulation/Gait Assistance 4: Min guard   Ambulation/Gait Assistance Details Gait with conversational tasks, gait with environmental scanning.  Generally, gait appears slower today.   Ambulation Distance (Feet) 200 Feet  200 ft   Assistive device Rolling walker   Gait Pattern Step-through pattern;Decreased step length - right;Decreased step length - left;Decreased weight shift to  right  Increased tremors noted in RLE   Ambulation Surface Level;Indoor   Pre-Gait Activities With turns, cues for marching in place for increased foot clearance for improved ease of turns.     Gait Comments Extra time for turns due to increased tremors in RLE.  Cues for stopping, resetting, weightshifting to clear foot during turns.     Knee/Hip Exercises: Seated   Long Arc Quad Strengthening;Right;Left;2 sets;10 reps;Weights   Long Arc Quad Weight 1 lbs.   Marching Limitations 3 sets of 10 reps  2 sets of 10 reps performed with 1# weights   Sit to Sand 10 reps  minimal UE support push to stand     Discussed options for ankle weights at home-PT recommends adjustable cuff weights, but need to monitor patient's response of addition of weights during session today prior to adding weights at home.  Self Care:  Initiated discussion of POC with plans to check LTGs next week.  Both patient and wife would like to continue with therapy if possible to continue to work on gait, strength, and balance.  Pt's wife reports they are to see Dr. Carles Collet early May for a second opinion.  Answered wife's questions about Parkinson's symptoms.  Talked briefly about possibility of community fitness-he has been to [facility] in the past and is open to possibility of returning.             PT Short Term Goals - 07/07/16 1232      PT SHORT TERM GOAL #1   Title Pt will perform HEP with family supervision for improved posture, strength, gait and balance.  TARGET 07/07/16   Time 4   Period Weeks   Status Achieved     PT SHORT TERM GOAL #2   Title Berg Balance score to be assessed, with goal to be written as appropriate. 2/28 score 32/56; Goal to increase to 37/56 to demonstrate improved balance/safety.    Baseline 38/56 on 07/07/16   Time 4   Period Weeks   Status Achieved     PT SHORT TERM GOAL #3   Title Pt will improve TUG score to less than or equal to 30 seconds for decreased fall risk.   Baseline  43.72 sec on 07/05/16   Time 4   Period Weeks   Status Not Met     PT SHORT TERM GOAL #4   Title Pt will perform at least 8 of 10 reps of sit<>stand transfers with minimal UE support, modified independently for improved safety and efficiency with transfers.   Time 4   Period Weeks   Status Achieved     PT SHORT TERM GOAL #5   Title Pt will ambulate at least 200 ft,  using RW, with min assistance, for improved safety and efficiency with gait.   Time 4   Period Weeks   Status Achieved     PT SHORT TERM GOAL #6   Title Pt will negotiate one step no handrail, with min assistance, for safe negotiation of step into and out of home.   Time 4   Period Weeks   Status Achieved           PT Long Term Goals - 06/08/16 1627      PT LONG TERM GOAL #1   Title Pt and wife will verbalize understanding of fall prevention in the home environment.  TARGT 08/06/16   Time 8   Period Weeks   Status New     PT LONG TERM GOAL #2   Title Pt will improve TUG score to less than or equal to 20 seconds for decreased fall risk.   Time 8   Period Weeks   Status New     PT LONG TERM GOAL #3   Title Pt will improve gait velocity to at least 2 ft/sec for decreased fall risk and improved gait efficiency and safety.   Time 8   Period Weeks   Status New     PT LONG TERM GOAL #4   Title Pt will improve Berg Balance score by at least 10 points (from eval) for decreased fall risk.   Time 8   Period Weeks   Status New     PT LONG TERM GOAL #5   Title Pt will ambulate at least 500 ft using RW, with supervision, for improved gait efficiency and safety.   Time 8   Period Weeks   Status New               Plan - 07/28/16 2123    Clinical Impression Statement Focused session today on gait training as well as lower extremity strengthening activities with weights.  Pt's gait slowed somewhat this visit-likely due to dual tasking (environmental scanning/conversation) as well as increased RLE tremor  noted this visit.  Incorporated strategies to overcome these epsiodes, with pt demonstrating understanding.   Rehab Potential Good   PT Frequency 2x / week   PT Duration 8 weeks  plus eval   PT Treatment/Interventions ADLs/Self Care Home Management;Functional mobility training;Gait training;Therapeutic activities;Therapeutic exercise;Balance training;Neuromuscular re-education;Patient/family education   PT Next Visit Plan Check LTGs; likely plan to renew PT   PT Home Exercise Plan PWR! Moves in sitting, repeated sit<>stand transfers, ant-post and lateral wt-shifts   Consulted and Agree with Plan of Care Patient;Family member/caregiver   Family Member Consulted wife      Patient will benefit from skilled therapeutic intervention in order to improve the following deficits and impairments:  Abnormal gait, Decreased activity tolerance, Decreased balance, Decreased mobility, Decreased strength, Difficulty walking, Impaired tone, Postural dysfunction  Visit Diagnosis: Other abnormalities of gait and mobility  Muscle weakness (generalized)     Problem List Patient Active Problem List   Diagnosis Date Noted  . AML (acute myelogenous leukemia) ([place]) 02/20/2015  . Leukocytosis 02/19/2015  . Hepatosplenomegaly 02/19/2015  . Lymphedema: RLE 02/19/2015  . PMR (polymyalgia rheumatica) (HCC) 02/19/2015  . Cerebral thrombosis with cerebral infarction ([place]) 10/29/2014  . GERD (gastroesophageal reflux disease) 10/28/2014  . Diabetes mellitus without complication ([place]) 16/94/5038  . History of GI bleed 10/28/2014  . Vertigo 10/28/2014  . Diarrhea 10/28/2014  . Essential hypertension   . Stroke with cerebral ischemia ([place])   . Acute  GI bleeding 04/16/2011  . Skin cancer 04/15/2011  . Thrombocytopenia ([place]) 03/28/2011  . Lymphadenopathy, neck 03/08/2011  . CAD (coronary artery disease) 01/13/2011  . Hypothyroidism 03/29/2010  . SPLENOMEGALY 03/29/2010  . HEPATOMEGALY, HX OF 03/29/2010  .  FEVER, HX OF 03/29/2010  . HLD (hyperlipidemia) 09/07/2007  . HYPERTENSION 09/07/2007  . COUGH 09/07/2007    MARRIOTT,AMY W. 07/28/2016, 9:30 PM  Frazier Butt., PT  [facility] [address] [place], [place], [address] Phone: [phone]   Fax:  [phone]  Name: Nathan Kemp MRN: [id number] Date of Birth: 12-12-1940

## 2016-08-01 ENCOUNTER — Ambulatory Visit: Payer: Medicare Other | Admitting: Physical Therapy

## 2016-08-02 ENCOUNTER — Ambulatory Visit: Payer: Medicare Other | Admitting: Physical Therapy

## 2016-08-03 ENCOUNTER — Ambulatory Visit: Payer: Medicare Other | Admitting: Physical Therapy

## 2016-08-04 ENCOUNTER — Ambulatory Visit: Payer: Medicare Other | Admitting: Physical Therapy

## 2016-08-04 DIAGNOSIS — R293 Abnormal posture: Secondary | ICD-10-CM | POA: Diagnosis not present

## 2016-08-04 DIAGNOSIS — M6281 Muscle weakness (generalized): Secondary | ICD-10-CM | POA: Diagnosis not present

## 2016-08-04 DIAGNOSIS — R2689 Other abnormalities of gait and mobility: Secondary | ICD-10-CM

## 2016-08-04 DIAGNOSIS — R2681 Unsteadiness on feet: Secondary | ICD-10-CM | POA: Diagnosis not present

## 2016-08-04 NOTE — Therapy (Signed)
[facility] [address] [place], [place], [address] Phone: [phone]   Fax:  [phone]  Physical Therapy Treatment  Patient Details  Name: Nathan Kemp MRN: [id number] Date of Birth: 05/07/1940 Referring Provider: Sonia Baller  Encounter Date: 08/04/2016      PT End of Session - 08/04/16 1010    Visit Number 16   Number of Visits 17   Date for PT Re-Evaluation 08/06/16   Authorization Type Medicare Primary; BCBS secondary-GCODE every 10th visit   PT Start Time 0810  Pt arrives late   PT Stop Time 0848   PT Time Calculation (min) 38 min   Equipment Utilized During Treatment Gait belt   Activity Tolerance Patient tolerated treatment well   Behavior During Therapy [facility] for tasks assessed/performed      Past Medical History:  Diagnosis Date  . Acute GI bleeding 04/16/2011  . Anemia   . Anorexia   . Asthma    when  have congstion  of chest  . Blood dyscrasia 06/2009   thrombocytopenia  . Blood transfusion    platelets 12/12  . 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 Coronary artery disease    stent 06  . Cough   . DDD (degenerative disc disease), cervical   . Diabetes mellitus    borderline-controlled by diet  . ED (erectile dysfunction)   . Essential hypertension   . Fatigue   . GERD (gastroesophageal reflux disease)   . Heart murmur   . Hyperlipidemia   . Hypertension   . Hypothyroidism   . IBS (irritable bowel syndrome)   . Leg swelling    due to lymphedema  . Lymphedema: RLE 02/19/2015  . Obesity   . PMR (polymyalgia rheumatica) ([place]) 02/19/2015  . Pneumonia   . Polymyalgia ([place])   . Thrombocytopenia ([place])   . Thyroid disease   . Trouble swallowing   . Vitamin D deficiency     Past Surgical History:  Procedure Laterality Date  . APPENDECTOMY  1967  . BONE MARROW BIOPSY     2011-at [facility], 01/2010-at [facility], 06/12-[facility]  . CARDIAC CATHETERIZATION   01/31/2005   EF 60-65%  . cataract Bilateral   . CORONARY ANGIOPLASTY WITH STENT PLACEMENT  2006   STENTING OF HIS CORONARY ARTERY  . EDG Dilitation  [id number]  . 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 LYMPH NODE BIOPSY  03/15/2011   Procedure: LYMPH NODE BIOPSY;  Surgeon: Earnstine Regal, MD;  Location: [facility];  Service: General;  Laterality: Right;  Right Anterior Cervical Lymph Node Excisional Biopsy   . lymph node removal  2011   right groin  . NASAL SINUS SURGERY  2005  . TONSILLECTOMY AND ADENOIDECTOMY    . US ECHOCARDIOGRAPHY  02/28/2005   EF 55-60%    There were no vitals filed for this visit.      Subjective Assessment - 08/04/16 0811    Subjective Been exercising and walking this week.  No falls.   Patient is accompained by: --   Pertinent History Leukemia (AML) dx Nov. 2016 (in remission); LUE intention tremor > 5 years; polymyalgia, DM, CAD, dysarthria   Limitations Walking   Patient Stated Goals Pt's goal for therapy is to improve balance and to walk again.   Currently in Pain? No/denies  [facility] Adult PT Treatment/Exercise - 08/04/16 0826      Transfers   Transfers Sit to Stand;Stand to Sit   Sit to Stand 5: Supervision;Without upper extremity assist;From bed;From chair/3-in-1;6: Modified independent (Device/Increase time)   Stand to Sit 5: Supervision;6: Modified independent (Device/Increase time);With upper extremity assist;Without upper extremity assist;To bed;To chair/3-in-1   Comments Minimal use of UE support with sit<>stand from [employer] transfer.  Pt able to transfer without UE support with increased time.     Ambulation/Gait   Ambulation/Gait Yes   Ambulation/Gait Assistance 5: Supervision   Ambulation Distance (Feet) 520 Feet  then 85 ft to restroom at end of session   Assistive device Rolling walker   Gait Pattern Step-through pattern;Decreased step length -  right;Decreased step length - left;Decreased weight shift to right   Ambulation Surface Level;Indoor   Gait velocity 25.13 sec = 1.31 ft/sec  at end of session; increased tremors noted with gait.     Berg Balance Test   Sit to Stand Able to stand without using hands and stabilize independently   Standing Unsupported Able to stand safely 2 minutes   Sitting with Back Unsupported but Feet Supported on Floor or Stool Able to sit safely and securely 2 minutes   Stand to Sit Sits safely with minimal use of hands   Transfers Able to transfer safely, minor use of hands   Standing Unsupported with Eyes Closed Able to stand 10 seconds safely   Standing Ubsupported with Feet Together Able to place feet together independently and stand 1 minute safely   From Standing, Reach Forward with Outstretched Arm Can reach forward >12 cm safely (5")   From Standing Position, Pick up Object from Floor Able to pick up shoe, needs supervision   From Standing Position, Turn to Look Behind Over each Shoulder Looks behind from both sides and weight shifts well   Turn 360 Degrees Able to turn 360 degrees safely but slowly   Standing Unsupported, Alternately Place Feet on Step/Stool Needs assistance to keep from falling or unable to try   Standing Unsupported, One Foot in Front Able to plae foot ahead of the other independently and hold 30 seconds   Standing on One Leg Tries to lift leg/unable to hold 3 seconds but remains standing independently   Total Score 44     Timed Up and Go Test   TUG Normal TUG   Normal TUG (seconds) 57.66  46.33 sec                PT Education - 08/04/16 1009    Education provided Yes   Education Details Results of Berg, TUG, gait velocity scores   Person(s) Educated Patient   Methods Explanation   Comprehension Verbalized understanding          PT Short Term Goals - 07/07/16 1232      PT SHORT TERM GOAL #1   Title Pt will perform HEP with family supervision for  improved posture, strength, gait and balance.  TARGET 07/07/16   Time 4   Period Weeks   Status Achieved     PT SHORT TERM GOAL #2   Title Berg Balance score to be assessed, with goal to be written as appropriate. 2/28 score 32/56; Goal to increase to 37/56 to demonstrate improved balance/safety.    Baseline 38/56 on 07/07/16   Time 4   Period Weeks   Status Achieved     PT SHORT TERM GOAL #3   Title Pt will  improve TUG score to less than or equal to 30 seconds for decreased fall risk.   Baseline 43.72 sec on 07/05/16   Time 4   Period Weeks   Status Not Met     PT SHORT TERM GOAL #4   Title Pt will perform at least 8 of 10 reps of sit<>stand transfers with minimal UE support, modified independently for improved safety and efficiency with transfers.   Time 4   Period Weeks   Status Achieved     PT SHORT TERM GOAL #5   Title Pt will ambulate at least 200 ft, using RW, with min assistance, for improved safety and efficiency with gait.   Time 4   Period Weeks   Status Achieved     PT SHORT TERM GOAL #6   Title Pt will negotiate one step no handrail, with min assistance, for safe negotiation of step into and out of home.   Time 4   Period Weeks   Status Achieved           PT Long Term Goals - 08/04/16 8657      PT LONG TERM GOAL #1   Title Pt and wife will verbalize understanding of fall prevention in the home environment.  TARGT 08/06/16   Time 8   Period Weeks   Status Achieved     PT LONG TERM GOAL #2   Title Pt will improve TUG score to less than or equal to 20 seconds for decreased fall risk.   Baseline TUG 46.33 sec at best 08/04/16   Time 8   Period Weeks   Status Not Met     PT LONG TERM GOAL #3   Title Pt will improve gait velocity to at least 2 ft/sec for decreased fall risk and improved gait efficiency and safety.   Time 8   Period Weeks   Status On-going     PT LONG TERM GOAL #4   Title Pt will improve Berg Balance score by at least 10 points (from  eval) for decreased fall risk.   Baseline eval Berg 32/56; Berg on 08/04/16 44/56   Time 8   Period Weeks   Status Achieved     PT LONG TERM GOAL #5   Title Pt will ambulate at least 500 ft using RW, with supervision, for improved gait efficiency and safety.   Time 8   Period Weeks   Status Achieved               Plan - 08/04/16 1012    Clinical Impression Statement Pt has met LTG 1, 4, 5.  LTG 2 not met, with TUG slowed due to pt safely slowing to negotiate turns (as RLE tremors increase with turning).  Gait velocity to be checked again tomorrow, as gait velocity taken at end of session today with increased tremors/slowed gait pace noted.   Rehab Potential Good   PT Frequency 2x / week   PT Duration 8 weeks  plus eval   PT Treatment/Interventions ADLs/Self Care Home Management;Functional mobility training;Gait training;Therapeutic activities;Therapeutic exercise;Balance training;Neuromuscular re-education;Patient/family education   PT Next Visit Plan Check gait velocity; complete renewal for PT; work on lower extremity strengthening with weights   PT Home Exercise Plan PWR! Moves in sitting, repeated sit<>stand transfers, ant-post and lateral wt-shifts   Consulted and Agree with Plan of Care Patient;Family member/caregiver   Family Member Consulted wife      Patient will benefit from skilled therapeutic intervention in order to improve  the following deficits and impairments:  Abnormal gait, Decreased activity tolerance, Decreased balance, Decreased mobility, Decreased strength, Difficulty walking, Impaired tone, Postural dysfunction  Visit Diagnosis: Other abnormalities of gait and mobility  Unsteadiness on feet     Problem List Patient Active Problem List   Diagnosis Date Noted  . AML (acute myelogenous leukemia) ([place]) 02/20/2015  . Leukocytosis 02/19/2015  . Hepatosplenomegaly 02/19/2015  . Lymphedema: RLE 02/19/2015  . PMR (polymyalgia rheumatica) (HCC)  02/19/2015  . Cerebral thrombosis with cerebral infarction ([place]) 10/29/2014  . GERD (gastroesophageal reflux disease) 10/28/2014  . Diabetes mellitus without complication ([place]) 30/12/2328  . History of GI bleed 10/28/2014  . Vertigo 10/28/2014  . Diarrhea 10/28/2014  . Essential hypertension   . Stroke with cerebral ischemia ([place])   . Acute GI bleeding 04/16/2011  . Skin cancer 04/15/2011  . Thrombocytopenia ([place]) 03/28/2011  . Lymphadenopathy, neck 03/08/2011  . CAD (coronary artery disease) 01/13/2011  . Hypothyroidism 03/29/2010  . SPLENOMEGALY 03/29/2010  . HEPATOMEGALY, HX OF 03/29/2010  . FEVER, HX OF 03/29/2010  . HLD (hyperlipidemia) 09/07/2007  . HYPERTENSION 09/07/2007  . COUGH 09/07/2007    Cystal Shannahan W. 08/04/2016, 10:15 AM  Frazier Butt., PT  [facility] [address] [place], [place], [address] Phone: [phone]   Fax:  [phone]  Name: Nathan Kemp MRN: [id number] Date of Birth: [birth year]

## 2016-08-05 ENCOUNTER — Ambulatory Visit: Payer: Medicare Other | Admitting: Physical Therapy

## 2016-08-05 DIAGNOSIS — R2681 Unsteadiness on feet: Secondary | ICD-10-CM | POA: Diagnosis not present

## 2016-08-05 DIAGNOSIS — R293 Abnormal posture: Secondary | ICD-10-CM

## 2016-08-05 DIAGNOSIS — M6281 Muscle weakness (generalized): Secondary | ICD-10-CM

## 2016-08-05 DIAGNOSIS — R2689 Other abnormalities of gait and mobility: Secondary | ICD-10-CM | POA: Diagnosis not present

## 2016-08-05 NOTE — Therapy (Signed)
[facility] [address] [place], [place], [address] Phone: [phone]   Fax:  [phone]  Physical Therapy Treatment  Patient Details  Name: Nathan Kemp MRN: [id number] Date of Birth: Mar 25, 1941 Referring Provider: Sonia Baller  Encounter Date: 08/05/2016      PT End of Session - 08/05/16 1250    Visit Number 17   Number of Visits 25   Date for PT Re-Evaluation 68/34/19  Per recert 10/08/27   Authorization Type Medicare Primary; BCBS secondary-GCODE every 10th visit   PT Start Time 0806   PT Stop Time 0850   PT Time Calculation (min) 44 min   Equipment Utilized During Treatment Gait belt   Activity Tolerance Patient tolerated treatment well   Behavior During Therapy [facility] for tasks assessed/performed      Past Medical History:  Diagnosis Date  . Acute GI bleeding 04/16/2011  . Anemia   . Anorexia   . Asthma    when  have congstion  of chest  . Blood dyscrasia 06/2009   thrombocytopenia  . Blood transfusion    platelets 12/12  . 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 Coronary artery disease    stent 06  . Cough   . DDD (degenerative disc disease), cervical   . Diabetes mellitus    borderline-controlled by diet  . ED (erectile dysfunction)   . Essential hypertension   . Fatigue   . GERD (gastroesophageal reflux disease)   . Heart murmur   . Hyperlipidemia   . Hypertension   . Hypothyroidism   . IBS (irritable bowel syndrome)   . Leg swelling    due to lymphedema  . Lymphedema: RLE 02/19/2015  . Obesity   . PMR (polymyalgia rheumatica) ([place]) 02/19/2015  . Pneumonia   . Polymyalgia ([place])   . Thrombocytopenia ([place])   . Thyroid disease   . Trouble swallowing   . Vitamin D deficiency     Past Surgical History:  Procedure Laterality Date  . APPENDECTOMY  1967  . BONE MARROW BIOPSY     2011-at [facility], 01/2010-at [facility], 06/12-[facility]  . CARDIAC CATHETERIZATION   01/31/2005   EF 60-65%  . cataract Bilateral   . CORONARY ANGIOPLASTY WITH STENT PLACEMENT  2006   STENTING OF HIS CORONARY ARTERY  . EDG Dilitation  [id number]  . 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 LYMPH NODE BIOPSY  03/15/2011   Procedure: LYMPH NODE BIOPSY;  Surgeon: Earnstine Regal, MD;  Location: [facility];  Service: General;  Laterality: Right;  Right Anterior Cervical Lymph Node Excisional Biopsy   . lymph node removal  2011   right groin  . NASAL SINUS SURGERY  2005  . TONSILLECTOMY AND ADENOIDECTOMY    . US ECHOCARDIOGRAPHY  02/28/2005   EF 55-60%    There were no vitals filed for this visit.      Subjective Assessment - 08/05/16 0810    Subjective No new issues this visit.  Wife reports he walks better here in therapy than at home.  Pt reports it is because of wood surfaces with multiple (oriental) rugs.   Pertinent History Leukemia (AML) dx Nov. 2016 (in remission); LUE intention tremor > 5 years; polymyalgia, DM, CAD, dysarthria   Limitations Walking   Patient Stated Goals Pt's goal for therapy is to improve balance and to walk again.  Currently in Pain? No/denies                         [facility] Adult PT Treatment/Exercise - 08/05/16 0811      Transfers   Transfers Sit to Stand;Stand to Sit   Sit to Stand 5: Supervision;Without upper extremity assist;From bed;From chair/3-in-1;6: Modified independent (Device/Increase time)   Stand to Sit 5: Supervision;6: Modified independent (Device/Increase time);With upper extremity assist;Without upper extremity assist;To bed;To chair/3-in-1   Number of Reps Other reps (comment)  Multiple reps through session to transition to gait/balance      Ambulation/Gait   Ambulation/Gait Yes   Ambulation/Gait Assistance 5: Supervision   Ambulation/Gait Assistance Details Practiced negotiating thresholds of carpet>tile today, to simulate change of surfaces at home.  Cues  provided for keeping RW close during turns and during lifting walker to change surfaces (versus pushing walker too far in front)   Ambulation Distance (Feet) 80 Feet  250 ft, then 350 ft   Assistive device Rolling walker   Gait Pattern Step-through pattern;Decreased step length - right;Decreased step length - left;Decreased weight shift to right   Ambulation Surface Level;Indoor   Gait velocity 20.72 sec = 1.58 ft/sec   Pre-Gait Activities Began session by walking from lobby into gym area with RW, supervision   Gait Comments With second bout of gait, pt requests to sit after 350 ft due to fatigue/RLE increased tremors/festination.      During gait, practiced methods of turning, including wide-U turn and tighter turns, to "square up" to fully face carpeted surfaces for improved negotiation of walker.       Balance Exercises - 08/05/16 0834      Balance Exercises: Standing   Tandem Stance Eyes open;Upper extremity support 1;3 reps;30 secs   SLS Eyes open;Solid surface;Upper extremity support 1;2 reps;10 secs   Standing, One Foot on a Step Eyes open;6 inch  Marching, alternating legs 2 set x 10   Sidestepping Upper extremity support;2 reps  10 ft  each direction    Standing activities performed at elevated mat surface simulating counter.  Cues provided for upright posture throughout.           PT Short Term Goals - 07/07/16 1232      PT SHORT TERM GOAL #1   Title Pt will perform HEP with family supervision for improved posture, strength, gait and balance.  TARGET 07/07/16   Time 4   Period Weeks   Status Achieved     PT SHORT TERM GOAL #2   Title Berg Balance score to be assessed, with goal to be written as appropriate. 2/28 score 32/56; Goal to increase to 37/56 to demonstrate improved balance/safety.    Baseline 38/56 on 07/07/16   Time 4   Period Weeks   Status Achieved     PT SHORT TERM GOAL #3   Title Pt will improve TUG score to less than or equal to 30 seconds for  decreased fall risk.   Baseline 43.72 sec on 07/05/16   Time 4   Period Weeks   Status Not Met     PT SHORT TERM GOAL #4   Title Pt will perform at least 8 of 10 reps of sit<>stand transfers with minimal UE support, modified independently for improved safety and efficiency with transfers.   Time 4   Period Weeks   Status Achieved     PT SHORT TERM GOAL #5   Title Pt will ambulate at least  200 ft, using RW, with min assistance, for improved safety and efficiency with gait.   Time 4   Period Weeks   Status Achieved     PT SHORT TERM GOAL #6   Title Pt will negotiate one step no handrail, with min assistance, for safe negotiation of step into and out of home.   Time 4   Period Weeks   Status Achieved           PT Long Term Goals - 08/05/16 1251      PT LONG TERM GOAL #1   Title Pt and wife will verbalize understanding of fall prevention in the home environment.  TARGT 08/06/16   Time 8   Period Weeks   Status Achieved     PT LONG TERM GOAL #2   Title Pt will improve TUG score to less than or equal to 20 seconds for decreased fall risk.     Baseline TUG 46.33 sec at best 08/04/16   Time 8   Period Weeks   Status Not Met     PT LONG TERM GOAL #3   Title Pt will improve gait velocity to at least 2 ft/sec for decreased fall risk and improved gait efficiency and safety.   Baseline 1.58 ft/sec on 08/05/16   Time 8   Period Weeks   Status Not Met     PT LONG TERM GOAL #4   Title Pt will improve Berg Balance score by at least 10 points (from eval) for decreased fall risk.   Baseline eval Berg 32/56; Berg on 08/04/16 44/56   Time 8   Period Weeks   Status Achieved     PT LONG TERM GOAL #5   Title Pt will ambulate at least 500 ft using RW, with supervision, for improved gait efficiency and safety.   Time 8   Period Weeks   Status Achieved     Additional Long Term Goals   Additional Long Term Goals Yes     PT LONG TERM GOAL #6   Title Pt will improve TUG score to less  than or equal to 35 seconds for decreased fall risk. TARGET 09/04/16   Time 4  per recert 3/66/29   Period Weeks   Status New     PT LONG TERM GOAL #7   Title Pt will improve gait velocity to at least 1.8 ft/sec for decreased fall risk.  TARGET 09/04/16   Time 4  Per recert 4/76/54   Period Weeks   Status New     PT LONG TERM GOAL #8   Title Pt will improve Berg Balance score to at least 48/56 for decreased fall risk.  TARGET 09/04/16   Time 4  Per recert 6/50/35   Period Weeks   Status New     PT LONG TERM GOAL  #9   TITLE Pt/wife will verbalize community fitness options upon D/C from PT.  TARGET 09/04/16   Time 4  per recert 4/65/68   Period Weeks   Status New               Plan - 08/05/16 1251    Clinical Impression Statement LTG 3 not met, with gait velocity at beginning of session 1.58 ft/sec.  Pt has demonstrated improved balance on Berg Balance test and improved gait velocity from eval.  However, pt continues to be at fall risk.  Pt's fatigue even within session seems to contribute to increased RLE tremors, increased festination of RLE with  gait and turns.  New LTGs added (LTG 6-9) to further address balance, strength and gait for improved functional mobility and decreased fall risk.   Rehab Potential Good   PT Frequency 2x / week   PT Duration 4 weeks  per recert 12/04/27   PT Treatment/Interventions ADLs/Self Care Home Management;Functional mobility training;Gait training;Therapeutic activities;Therapeutic exercise;Balance training;Neuromuscular re-education;Patient/family education   PT Next Visit Plan Work on lower extremity strengthening with weights (to add to HEP); standing balance activities.   PT Home Exercise Plan PWR! Moves in sitting, repeated sit<>stand transfers, ant-post and lateral wt-shifts   Consulted and Agree with Plan of Care Patient;Family member/caregiver   Family Member Consulted wife      Patient will benefit from skilled therapeutic  intervention in order to improve the following deficits and impairments:  Abnormal gait, Decreased activity tolerance, Decreased balance, Decreased mobility, Decreased strength, Difficulty walking, Impaired tone, Postural dysfunction  Visit Diagnosis: Other abnormalities of gait and mobility  Unsteadiness on feet  Muscle weakness (generalized)  Abnormal posture     Problem List Patient Active Problem List   Diagnosis Date Noted  . AML (acute myelogenous leukemia) ([place]) 02/20/2015  . Leukocytosis 02/19/2015  . Hepatosplenomegaly 02/19/2015  . Lymphedema: RLE 02/19/2015  . PMR (polymyalgia rheumatica) (HCC) 02/19/2015  . Cerebral thrombosis with cerebral infarction ([place]) 10/29/2014  . GERD (gastroesophageal reflux disease) 10/28/2014  . Diabetes mellitus without complication ([place]) 93/71/6967  . History of GI bleed 10/28/2014  . Vertigo 10/28/2014  . Diarrhea 10/28/2014  . Essential hypertension   . Stroke with cerebral ischemia ([place])   . Acute GI bleeding 04/16/2011  . Skin cancer 04/15/2011  . Thrombocytopenia ([place]) 03/28/2011  . Lymphadenopathy, neck 03/08/2011  . CAD (coronary artery disease) 01/13/2011  . Hypothyroidism 03/29/2010  . SPLENOMEGALY 03/29/2010  . HEPATOMEGALY, HX OF 03/29/2010  . FEVER, HX OF 03/29/2010  . HLD (hyperlipidemia) 09/07/2007  . HYPERTENSION 09/07/2007  . COUGH 09/07/2007    Jenissa Tyrell W. 08/05/2016, 1:00 PM Frazier Butt., PT [facility] [address] [place], [place], [address] Phone: [phone]   Fax:  [phone]  Name: Nathan Kemp MRN: [id number] Date of Birth: 12-06-1940

## 2016-08-09 ENCOUNTER — Ambulatory Visit: Payer: Medicare Other | Admitting: Physical Therapy

## 2016-08-11 ENCOUNTER — Ambulatory Visit: Payer: Medicare Other | Admitting: Physical Therapy

## 2016-08-15 ENCOUNTER — Ambulatory Visit: Payer: Medicare Other | Admitting: Physical Therapy

## 2016-08-16 ENCOUNTER — Ambulatory Visit: Payer: Medicare Other | Attending: Neurology | Admitting: Physical Therapy

## 2016-08-16 DIAGNOSIS — R2681 Unsteadiness on feet: Secondary | ICD-10-CM | POA: Insufficient documentation

## 2016-08-16 DIAGNOSIS — M6281 Muscle weakness (generalized): Secondary | ICD-10-CM | POA: Insufficient documentation

## 2016-08-16 DIAGNOSIS — R2689 Other abnormalities of gait and mobility: Secondary | ICD-10-CM

## 2016-08-16 DIAGNOSIS — R293 Abnormal posture: Secondary | ICD-10-CM | POA: Insufficient documentation

## 2016-08-16 NOTE — Progress Notes (Signed)
Nathan Kemp was seen today in the movement disorders clinic for neurologic consultation at the request of ARONSON,RICHARD A, MD.  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  The records that were made available to me were reviewed.  They are quite extensive.  The patient developed new onset dysarthria and speech disturbance in July, 2016 and was admitted for stroke workup, which turned out to be negative.  He was worked up for myasthenia gravis and that workup was apparently negative.  Wife states that they thought that it was a drug reaction from a drug given by a dentist. She states today, however, that the speech issues were NOT acute in onset and were slow to come on and have been progressing ever since.   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 Follow up cytology showed resolution of leukemic meningitis but symptoms continued to worsen. Multiple MRI brain and MR C/T-spine were negative for structural/mass lesions.  Speech issues were subsequently felt due to spasmodic dysphonia with intermittent myoclonus and it was responsive to Ativan.  Wife states that when he gets chemo he has tremor and jerking of the R arm/leg but the klonopin helps.  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  DaTScan apparently was attempted, but refused by insurance.  In November, 2017 the patient began to have fairly acute worsening of right leg tremor, worsening gait, worsening speech, falls and lab work revealed an elevated ammonia for which he was treated with lactulose.  He was apparently seen by multiple gastroenterologists at places such as [facility] and [facility] and no known etiology was noted.  He saw Dr. Hall Busing at [facility] for movement disorders in January, 2018.  EMG was  completed to rule out motor neuron disease.  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  Her examination was somewhat limited as the patient was being examined on levodopa and clonazepam.  Pts wife states that L arm and R leg are fairly "useless."  He has had essential tremor in the L hand for over 10 years, but the tremor in the L arm now is so bad that it has made that arm "useless."  It is not a different tremor than he had 10 years ago but it is more severe.  The tremor in the R leg can be there when he sits or stands but mostly if he stands.  If there is no weight on the leg (laying down), his leg never tremors.  Mostly the leg will tremor after he stands and then he will be unable to stand because of it.  He used to have episodes during chemo that were bad but none of those for 6 months.     Specific Symptoms:  Tremor: Yes.   Family hx of similar:  No. Voice: more dysphasia Sleep: sleeps well  Vivid Dreams:  No.  Acting out dreams:  No. Wet Pillows: No. Postural symptoms:  Yes.    Falls?  Yes.   (uses walker now and has gait belt) Bradykinesia symptoms: slow movements (doing neurorehab now and it helps) Loss of smell:  Yes.  Loss of taste:  No. Urinary Incontinence:  No. Difficulty Swallowing:  No. Handwriting, micrographia: No. Trouble with ADL's:  No.  Trouble buttoning clothing: No. Depression:  No. Memory changes:  Yes.   Hallucinations:  No.  visual distortions: No. N/V:  No. Lightheaded:  No.  Syncope: No. Diplopia:  No. Dyskinesia:  No.  Neuroimaging has  previously been performed.  It is not available for my review today.   patient did have an MRI of the brain on 04/06/2016 at [facility].  I do not have the films.  I do have the report.  It was reported only to show small vessel disease.     PREVIOUS MEDICATIONS: Sinemet  ALLERGIES:   Allergies  Allergen Reactions  . Cefdinir Hives  . Adhesive [Tape] Other (See Comments)    blisters  . Lodine [Etodolac]     Vertigo  . Lortab [Hydrocodone-Acetaminophen] Other (See Comments)    Makes patient very loopy. Update   Able to take now  . Niaspan [Niacin] Hives  . Clindamycin Nausea Only and Other (See Comments)    Stomach cramps  . Codeine Rash    Had rash previously but not recently-taken in cough medicine and no problems but patient can tolerate    CURRENT MEDICATIONS:  Outpatient Encounter Prescriptions as of 08/18/2016  Medication Sig  . carbidopa-levodopa (SINEMET IR) 25-100 MG tablet Take 1 tablet by mouth 4 (four) times daily.  . cholestyramine (QUESTRAN) 4 G packet Take 4 g by mouth daily.   . clonazePAM (KLONOPIN) 1 MG tablet Take 1 mg by mouth 4 (four) times daily.  Marland Kitchen dicyclomine (BENTYL) 20 MG tablet Take 20 mg by mouth 2 (two) times daily.   . diphenhydrAMINE (BENADRYL) 25 MG tablet Take 25 mg by mouth at bedtime as needed for sleep.   . insulin regular (NOVOLIN R,HUMULIN R) 100 units/mL injection Inject 6 Units into the skin daily as needed for high blood sugar. For glucose greater than 175  . levothyroxine (SYNTHROID, LEVOTHROID) 100 MCG tablet Take 100 mcg by mouth daily.  Marland Kitchen olmesartan-hydrochlorothiazide (BENICAR HCT) 40-25 MG per tablet Take 0.5 tablets by mouth every morning.   [name] Glycol-Propyl Glycol (SYSTANE) 0.4-0.3 % GEL Apply 1 Dose to eye at bedtime.  . predniSONE (DELTASONE) 10 MG tablet Take 10 mg by mouth daily with breakfast.   . traMADol (ULTRAM) 50 MG tablet Take 1 tablet (50 mg total) by mouth every 6 (six) hours as needed. (Patient taking differently: Take 50 mg by mouth 4 (four) times daily. )  . zolpidem (AMBIEN) 10 MG tablet Take 10 mg by mouth at bedtime.   . [DISCONTINUED] Polyethyl Glycol-Propyl Glycol (SYSTANE ULTRA OP) Place 1-2 drops into both eyes 4 (four) times daily as  needed (dry eyes).   . Insulin Glargine (LANTUS SOLOSTAR) 100 UNIT/ML Solostar Pen Inject 6 Units into the skin at bedtime as needed.  . nitroGLYCERIN (NITROSTAT) 0.4 MG SL tablet PLACE 1 TABLET UNDER THE TONGUE EVERY 5 MINUTES AS NEEDED FOR CHEST PAIN (Patient not taking: Reported on 08/18/2016)  . [DISCONTINUED] allopurinol (ZYLOPRIM) 300 MG tablet Take 300 mg by mouth daily.  . [DISCONTINUED] lidocaine-prilocaine (EMLA) cream   . [DISCONTINUED] LORazepam (ATIVAN) 1 MG tablet Take 1 mg by mouth.  . [DISCONTINUED] ondansetron (ZOFRAN) 4 MG tablet Take 4 mg by mouth as needed.  . [DISCONTINUED] potassium chloride (K-DUR,KLOR-CON) 10 MEQ tablet Take 20 mEq by mouth daily.   . [DISCONTINUED] senna-docusate (SENOKOT-S) 8.6-50 MG tablet Take by mouth.  No facility-administered encounter medications on file as of 08/18/2016.     PAST MEDICAL HISTORY:   Past Medical History:  Diagnosis Date  . Acute GI bleeding 04/16/2011  . Anemia   . Anorexia   . Asthma    when  have congstion  of chest  . Blood dyscrasia 06/2009   thrombocytopenia  . Blood transfusion    platelets 12/12  . 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 Coronary artery disease    stent 06  . Cough   . DDD (degenerative disc disease), cervical   . Diabetes mellitus    borderline-controlled by diet  . ED (erectile dysfunction)   . Essential hypertension   . Fatigue   . GERD (gastroesophageal reflux disease)   . Heart murmur   . Hyperlipidemia   . Hypertension   . Hypothyroidism   . IBS (irritable bowel syndrome)   . Leg swelling    due to lymphedema  . Lymphedema: RLE 02/19/2015  . Obesity   . PMR (polymyalgia rheumatica) ([place]) 02/19/2015  . Pneumonia   . Polymyalgia ([place])   . Thrombocytopenia ([place])   . Thyroid disease   . Trouble swallowing   . Vitamin D deficiency     PAST SURGICAL HISTORY:   Past Surgical History:  Procedure Laterality Date  . APPENDECTOMY  1967  . BONE MARROW BIOPSY     2011-at [facility],  01/2010-at [facility], 06/12-[facility]  . CARDIAC CATHETERIZATION  01/31/2005   EF 60-65%  . cataract Bilateral   . CORONARY ANGIOPLASTY WITH STENT PLACEMENT  2006   STENTING OF HIS CORONARY ARTERY  . EDG Dilitation  [id number]  . 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 LYMPH NODE BIOPSY  03/15/2011   Procedure: LYMPH NODE BIOPSY;  Surgeon: Earnstine Regal, MD;  Location: [facility]S;  Service: General;  Laterality: Right;  Right Anterior Cervical Lymph Node Excisional Biopsy   . lymph node removal  2011   right groin  . NASAL SINUS SURGERY  2005  . TONSILLECTOMY AND ADENOIDECTOMY    . US ECHOCARDIOGRAPHY  02/28/2005   EF 55-60%    SOCIAL HISTORY:   Social History   Social History  . Marital status: Married    Spouse name: N/A  . Number of children: N/A  . Years of education: N/A   Occupational History  . Not on file.   Social History Main Topics  . Smoking status: Never Smoker  . Smokeless tobacco: Never Used  . Alcohol use No  . Drug use: No  . Sexual activity: Not Currently   Other Topics Concern  . Not on file   Social History Narrative  . No narrative on file    FAMILY HISTORY:   Family Status  Relation Status  . Father Deceased at age 54  . Mother Deceased at age 48  . Sister Alive  . Brother Alive  . Daughter Alive    ROS:  A complete 10 system review of systems was obtained and was unremarkable apart from what is mentioned above.  PHYSICAL EXAMINATION:    VITALS:   Vitals:   08/18/16 0828  BP: (!) 98/58  Pulse: 76  SpO2: (!) 88%    GEN:  The patient appears stated age and is in NAD. HEENT:  Normocephalic, atraumatic.  The mucous membranes are moist. The superficial temporal arteries are without ropiness or tenderness.  CV:  RRR Lungs:  CTAB Neck/HEME:  There are no carotid bruits bilaterally.  Neurological examination:  Orientation: The patient is alert and oriented x3. Fund of knowledge is  appropriate.  Recent and remote memory are intact.  Attention and concentration are normal.    Able to name objects and repeat phrases..   Cranial nerves: There is good facial symmetry. Pupils are equal round and reactive to light bilaterally. Fundoscopic exam reveals clear margins bilaterally. Extraocular muscles are intact. The visual fields are full to confrontational testing. The speech is slow and purposeful, mildy dyphasic. Soft palate rises symmetrically and there is no tongue deviation. Hearing is intact to conversational tone. Sensation: Sensation is intact to light and pinprick throughout (facial, trunk, extremities). Vibration is intact at the bilateral big toe. There is no extinction with double simultaneous stimulation. There is no sensory dermatomal level identified.  No astereognosis.   No agraphesthesia  Motor: Strength is 5/5 in the bilateral upper and lower extremities.   Shoulder shrug is equal and symmetric.  There is no pronator drift.  No fasciculations. Deep tendon reflexes: Deep tendon reflexes are 2+/4 at the bilateral biceps, triceps, brachioradialis, patella and achilles. Plantar responses are downgoing bilaterally.  Movement examination: Tone: There is normal tone in the bilateral upper extremities.  The tone in the lower extremities is normal.  Abnormal movements: none Coordination:  There is no decremation with RAM's, with any form of RAMS, including alternating supination and pronation of the forearm, hand opening and closing, finger taps, heel taps and toe taps. Gait and Station: The patient has a gait belt and requires some help to get out of the wheelchair.  He was given a walker and stand in place for about a minute before he moves.  Once he walks, he actually walks very well with the walker.  I then took a walker away from him and just gently help with gait belt and he actually continued to walk very well.  He had mild difficulty with turns.  He did not  shuffle.  ASSESSMENT/PLAN:  1.  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  He really doesn't look that parkinsonian to me but speech has been profoundly and progressively affected, per patient and wife.  He apparently had neuropsych testing.  I don't have those results.  Was told he had dementia.  Wife to get me results of that.    -I did tell them that I certainly could be followed by the fact that he was on levodopa and clonazepam at the time that I saw him today.  They asked me if I thought that he needed the levodopa.  If he has PPA then he likely does not.  However, it is certainly possible that his tremulous spells are better because he is taking it.  Ultimately, we decided to hold the levodopa and see how he is doing off of it.  He will know within a day how he is going to do.  -We did talk about DaT scan.  Records indicated that insurance denied this in the past.  I talked to the DaT rep who indicated that [facility] should approve this scan.  We will try to work through this process of approval again and see what happens.  2.  Tremor  -Actually did not see any on my examination today.  He and his wife report a 10+ year history of essential tremor, primarily of the left  hand and it sounds like this condition has worsened with the course of time.  I think that is the primary etiology of what is going on with the left hand.  With a described in the legs sounded somewhat like orthostatic tremor and I wonder if that is why clonazepam helped, but I asked them to videotape an episode of the has one, which would be very helpful.  Regardless, the clonazepam helps and I would recommend that he continues this.  [facility] records indicated that he had some myoclonus as well, which clonazepam should be helpful for her as well.  3.  AML with leptomeningeal dissemination  -Pt is seeing [facility]c [facility]  4.  Much greater than 50% of this visit was spent in  counseling and coordinating care.  Total face to face time:  60 min.  This did not include the 45 min of non face to face time in record review.  Cc:  Geoffery Lyons, MD

## 2016-08-16 NOTE — Therapy (Signed)
[facility] [address] [place], [place], [address] Phone: [phone]   Fax:  [phone]  Physical Therapy Treatment  Patient Details  Name: Nathan Kemp MRN: [id number] Date of Birth: 1940/12/23 Referring Provider: Sonia Baller  Encounter Date: 08/16/2016      PT End of Session - 08/16/16 1421    Visit Number 18   Number of Visits 25   Date for PT Re-Evaluation 83/66/29  Per recert 4/76/54   Authorization Type Medicare Primary; BCBS secondary-GCODE every 10th visit   PT Start Time 0934   PT Stop Time 1017   PT Time Calculation (min) 43 min   Equipment Utilized During Treatment Gait belt   Activity Tolerance Patient tolerated treatment well   Behavior During Therapy [facility] for tasks assessed/performed      Past Medical History:  Diagnosis Date  . Acute GI bleeding 04/16/2011  . Anemia   . Anorexia   . Asthma    when  have congstion  of chest  . Blood dyscrasia 06/2009   thrombocytopenia  . Blood transfusion    platelets 12/12  . 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 Coronary artery disease    stent 06  . Cough   . DDD (degenerative disc disease), cervical   . Diabetes mellitus    borderline-controlled by diet  . ED (erectile dysfunction)   . Essential hypertension   . Fatigue   . GERD (gastroesophageal reflux disease)   . Heart murmur   . Hyperlipidemia   . Hypertension   . Hypothyroidism   . IBS (irritable bowel syndrome)   . Leg swelling    due to lymphedema  . Lymphedema: RLE 02/19/2015  . Obesity   . PMR (polymyalgia rheumatica) ([place]) 02/19/2015  . Pneumonia   . Polymyalgia ([place])   . Thrombocytopenia ([place])   . Thyroid disease   . Trouble swallowing   . Vitamin D deficiency     Past Surgical History:  Procedure Laterality Date  . APPENDECTOMY  1967  . BONE MARROW BIOPSY     2011-at [facility], 01/2010-at [facility], [facility]/12-[facility]  . CARDIAC CATHETERIZATION   01/31/2005   EF 60-65%  . cataract Bilateral   . CORONARY ANGIOPLASTY WITH STENT PLACEMENT  2006   STENTING OF HIS CORONARY ARTERY  . EDG Dilitation  [id number]  . 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 LYMPH NODE BIOPSY  03/15/2011   Procedure: LYMPH NODE BIOPSY;  Surgeon: Earnstine Regal, MD;  Location: [facility];  Service: General;  Laterality: Right;  Right Anterior Cervical Lymph Node Excisional Biopsy   . lymph node removal  2011   right groin  . NASAL SINUS SURGERY  2005  . TONSILLECTOMY AND ADENOIDECTOMY    . US ECHOCARDIOGRAPHY  02/28/2005   EF 55-60%    There were no vitals filed for this visit.      Subjective Assessment - 08/16/16 0939    Subjective Wife reports just not a good last 2 weeks; RLE tremors seem to be worse and speech seems to be worse.  No falls.  Pt reports not yet getting weights for home.  Pt and wife were at the beach last week.   Pertinent History Leukemia (AML) dx Nov. 2016 (in remission); LUE intention tremor > 5 years; polymyalgia, DM, CAD, dysarthria   Limitations Walking   Patient Stated Goals  Pt's goal for therapy is to improve balance and to walk again.   Currently in Pain? Yes   Pain Score 5    Pain Location --  Head to toe   Pain Orientation Right  all over   Pain Descriptors / Indicators --  Polymyalgia pain   Pain Onset More than a month ago   Pain Frequency Constant   Aggravating Factors  Nothing   Pain Relieving Factors Take prednisone for it-helps some                         [facility] Adult PT Treatment/Exercise - 08/16/16 0943      Ambulation/Gait   Ambulation/Gait Yes   Ambulation/Gait Assistance 5: Supervision;4: Min guard   Ambulation Distance (Feet) 80 Feet  then 150 ft   Assistive device Rolling walker   Gait Pattern Step-through pattern;Decreased step length - right;Decreased step length - left;Decreased weight shift to right  Increased festination/tremor RLE  with gait and turns   Ambulation Surface Level;Indoor   Pre-Gait Activities Begain session by walking from lobby   Gait Comments Pt needs extra time to initiate gait with lateral weightshifting and marching, then cues for increased R step length, increased R stance time to try to decreased RLE tremor.  Pt has slowed walking in general this visit, decreased step length and increased RLE tremor during gait.     Knee/Hip Exercises: Stretches   Active Hamstring Stretch Right;Left;3 reps;30 seconds  seated foot propped on 4" block   Gastroc Stretch Right;Left;3 reps;Other (comment)  15 seconds seated, using belt   Gastroc Stretch Limitations Therapist assist to get appropriate position for stretch     Knee/Hip Exercises: Seated   Long Arc Quad Strengthening;Right;Left;2 sets;10 reps;Weights   Long Arc Quad Weight 2 lbs.   Heel Slides Strengthening;Right;Left;2 sets;10 reps  Green theraband   Ball Squeeze 2 sets of 10 reps   Marching Limitations 2 sets of 10 reps, 2# weights     Ankle Exercises: Seated   Toe Raise 10 reps  green theraband   Other Seated Ankle Exercises seated ankle eversion x 10 reps with green theraband     Pt needs brief rest breaks during exercises.  Discussed with wife how to obtain adjustable weight ankle cuff weights for home use.             PT Short Term Goals - 07/07/16 1232      PT SHORT TERM GOAL #1   Title Pt will perform HEP with family supervision for improved posture, strength, gait and balance.  TARGET 07/07/16   Time 4   Period Weeks   Status Achieved     PT SHORT TERM GOAL #2   Title Berg Balance score to be assessed, with goal to be written as appropriate. 2/28 score 32/56; Goal to increase to 37/56 to demonstrate improved balance/safety.    Baseline 38/56 on 07/07/16   Time 4   Period Weeks   Status Achieved     PT SHORT TERM GOAL #3   Title Pt will improve TUG score to less than or equal to 30 seconds for decreased fall risk.    Baseline 43.72 sec on 07/05/16   Time 4   Period Weeks   Status Not Met     PT SHORT TERM GOAL #4   Title Pt will perform at least 8 of 10 reps of sit<>stand transfers with minimal UE support, modified independently for improved safety and efficiency  with transfers.   Time 4   Period Weeks   Status Achieved     PT SHORT TERM GOAL #5   Title Pt will ambulate at least 200 ft, using RW, with min assistance, for improved safety and efficiency with gait.   Time 4   Period Weeks   Status Achieved     PT SHORT TERM GOAL #6   Title Pt will negotiate one step no handrail, with min assistance, for safe negotiation of step into and out of home.   Time 4   Period Weeks   Status Achieved           PT Long Term Goals - 08/05/16 1251      PT LONG TERM GOAL #1   Title Pt and wife will verbalize understanding of fall prevention in the home environment.  TARGT 08/06/16   Time 8   Period Weeks   Status Achieved     PT LONG TERM GOAL #2   Title Pt will improve TUG score to less than or equal to 20 seconds for decreased fall risk.     Baseline TUG 46.33 sec at best 08/04/16   Time 8   Period Weeks   Status Not Met     PT LONG TERM GOAL #3   Title Pt will improve gait velocity to at least 2 ft/sec for decreased fall risk and improved gait efficiency and safety.   Baseline 1.58 ft/sec on 08/05/16   Time 8   Period Weeks   Status Not Met     PT LONG TERM GOAL #4   Title Pt will improve Berg Balance score by at least 10 points (from eval) for decreased fall risk.   Baseline eval Berg 32/56; Berg on 08/04/16 44/56   Time 8   Period Weeks   Status Achieved     PT LONG TERM GOAL #5   Title Pt will ambulate at least 500 ft using RW, with supervision, for improved gait efficiency and safety.   Time 8   Period Weeks   Status Achieved     Additional Long Term Goals   Additional Long Term Goals Yes     PT LONG TERM GOAL #6   Title Pt will improve TUG score to less than or equal to 35  seconds for decreased fall risk. TARGET 09/04/16   Time 4  per recert 1/54/00   Period Weeks   Status New     PT LONG TERM GOAL #7   Title Pt will improve gait velocity to at least 1.8 ft/sec for decreased fall risk.  TARGET 09/04/16   Time 4  Per recert 8/67/61   Period Weeks   Status New     PT LONG TERM GOAL #8   Title Pt will improve Berg Balance score to at least 48/56 for decreased fall risk.  TARGET 09/04/16   Time 4  Per recert 9/50/93   Period Weeks   Status New     PT LONG TERM GOAL  #9   TITLE Pt/wife will verbalize community fitness options upon D/C from PT.  TARGET 09/04/16   Time 4  per recert 2/67/12   Period Weeks   Status New               Plan - 08/16/16 1421    Clinical Impression Statement Focused session today on lower extremity strengthening with resistance and with weights.  Pt visibly fatigued near end of session and gait noted to be  slowed with increased incidence of RLE tremors during gait today.  Pt will continue to benefit from skilled PT to address strength, balance, gait training for improved functional mobility.   Rehab Potential Good   PT Frequency 2x / week   PT Duration 4 weeks  per recert 5/88/50   PT Treatment/Interventions ADLs/Self Care Home Management;Functional mobility training;Gait training;Therapeutic activities;Therapeutic exercise;Balance training;Neuromuscular re-education;Patient/family education   PT Next Visit Plan Add to HEP:  lower extremity strengthening with weights,theraband; standing balance activities as able   PT Home Exercise Plan PWR! Moves in sitting, repeated sit<>stand transfers, ant-post and lateral wt-shifts   Consulted and Agree with Plan of Care Patient;Family member/caregiver   Family Member Consulted wife      Patient will benefit from skilled therapeutic intervention in order to improve the following deficits and impairments:  Abnormal gait, Decreased activity tolerance, Decreased balance, Decreased  mobility, Decreased strength, Difficulty walking, Impaired tone, Postural dysfunction  Visit Diagnosis: Muscle weakness (generalized)  Unsteadiness on feet  Other abnormalities of gait and mobility     Problem List Patient Active Problem List   Diagnosis Date Noted  . AML (acute myelogenous leukemia) ([place]) 02/20/2015  . Leukocytosis 02/19/2015  . Hepatosplenomegaly 02/19/2015  . Lymphedema: RLE 02/19/2015  . PMR (polymyalgia rheumatica) (HCC) 02/19/2015  . Cerebral thrombosis with cerebral infarction ([place]) 10/29/2014  . GERD (gastroesophageal reflux disease) 10/28/2014  . Diabetes mellitus without complication ([place]) 27/74/1287  . History of GI bleed 10/28/2014  . Vertigo 10/28/2014  . Diarrhea 10/28/2014  . Essential hypertension   . Stroke with cerebral ischemia ([place])   . Acute GI bleeding 04/16/2011  . Skin cancer 04/15/2011  . Thrombocytopenia ([place]) 03/28/2011  . Lymphadenopathy, neck 03/08/2011  . CAD (coronary artery disease) 01/13/2011  . Hypothyroidism 03/29/2010  . SPLENOMEGALY 03/29/2010  . HEPATOMEGALY, HX OF 03/29/2010  . FEVER, HX OF 03/29/2010  . HLD (hyperlipidemia) 09/07/2007  . HYPERTENSION 09/07/2007  . COUGH 09/07/2007    Tykia Mellone W. 08/16/2016, 2:25 PM  Frazier Butt., PT [facility] [address] [place], [place], [address] Phone: [phone]   Fax:  [phone]  Name: Nathan Kemp MRN: [id number] Date of Birth: 1940/08/19

## 2016-08-18 ENCOUNTER — Ambulatory Visit: Payer: Medicare Other | Admitting: Physical Therapy

## 2016-08-18 ENCOUNTER — Ambulatory Visit (INDEPENDENT_AMBULATORY_CARE_PROVIDER_SITE_OTHER): Payer: Medicare Other | Admitting: Neurology

## 2016-08-18 ENCOUNTER — Encounter: Payer: Self-pay | Admitting: Neurology

## 2016-08-18 VITALS — BP 98/58 | HR 76

## 2016-08-18 DIAGNOSIS — F0280 Dementia in other diseases classified elsewhere without behavioral disturbance: Secondary | ICD-10-CM

## 2016-08-18 DIAGNOSIS — R251 Tremor, unspecified: Secondary | ICD-10-CM

## 2016-08-18 DIAGNOSIS — G3101 Pick's disease: Secondary | ICD-10-CM

## 2016-08-18 NOTE — Patient Instructions (Signed)
1. Hold Levodopa   2. Will call you to schedule Dat Scan near the end of May/beginning of June  3. Bring a copy of neurocognitive testing

## 2016-08-19 ENCOUNTER — Ambulatory Visit: Payer: Medicare Other | Admitting: Physical Therapy

## 2016-08-19 DIAGNOSIS — M6281 Muscle weakness (generalized): Secondary | ICD-10-CM

## 2016-08-19 DIAGNOSIS — R2681 Unsteadiness on feet: Secondary | ICD-10-CM | POA: Diagnosis not present

## 2016-08-19 DIAGNOSIS — R293 Abnormal posture: Secondary | ICD-10-CM | POA: Diagnosis not present

## 2016-08-19 DIAGNOSIS — R2689 Other abnormalities of gait and mobility: Secondary | ICD-10-CM | POA: Diagnosis not present

## 2016-08-19 NOTE — Patient Instructions (Addendum)
Knee Raise    With 2# weight around ankles, Lift knee and then lower it. Repeat with other knee, as in marching.  Do this slowly and controlled. Repeat _2 sets of 10___ times. Do __1-2__ sessions per day.  http://gt2.exer.us/445   Copyright  VHI. All rights reserved.  KNEE: Extension, Long Arc Quad (Weight)    Place weight around leg. Raise leg until knee is straight. Hold _3__ seconds. Use __2_ lb weight. _2 sets of 10__ reps per set, __1_ sets per day  Copyright  VHI. All rights reserved.  Hamstring Curl: Resisted (Sitting)    Facing anchor with tubing on right ankle, leg straight out, bend knee against the resistance of the band.  (The band can be anchored at heavy piece of furniture or your wife can hold the band and you pull against the resistance) Repeat _10___ times per set. Do __2__ sets per session. Do __1__ sessions per day.   http://orth.exer.us/668   Copyright  VHI. All rights reserved.  Adduction: Hip - Knees Together (Sitting)    Sit with towel roll or ball between knees. Push knees together. Hold for __3_ seconds. Rest for __3_ seconds. Repeat _2 sets of 10__ times. Do ___ times a day.  Copyright  VHI. All rights reserved.  HIP: Hamstrings - Short Sitting    Rest leg on raised surface or you can rest it on the floor. Keep knee straight. Lift chest, sitting tall.  You can lean forward to feel a gentle stretch.  Hold _30__ seconds. _3__ reps each leg per set, _1-2__ sets per day.   Copyright  VHI. All rights reserved.  ANKLE: Dorsiflexion (Band)    Sit at edge of surface. Place band around top of foot. Keeping heel on floor, raise toes of banded foot. Hold __3_ seconds. Use _green_______ band. _2 sets of 10__ reps per set  Copyright  VHI. All rights reserved.

## 2016-08-19 NOTE — Therapy (Signed)
[facility] [address] [place], [place], [address] Phone: [phone]   Fax:  [phone]  Physical Therapy Treatment  Patient Details  Name: Nathan Kemp MRN: [id number] Date of Birth: [birth year] Referring Provider: Sonia Baller  Encounter Date: 08/19/2016      PT End of Session - 08/19/16 1248    Visit Number 19   Number of Visits 25   Date for PT Re-Evaluation 15/17/61  Per recert 09/23/35   Authorization Type Medicare Primary; BCBS secondary-GCODE every 10th visit   PT Start Time 0928   PT Stop Time 1015   PT Time Calculation (min) 47 min   Equipment Utilized During Treatment Gait belt   Activity Tolerance Patient tolerated treatment well   Behavior During Therapy [facility] for tasks assessed/performed      Past Medical History:  Diagnosis Date  . Acute GI bleeding 04/16/2011  . Anemia   . Anorexia   . Asthma    when  have congstion  of chest  . Blood dyscrasia 06/2009   thrombocytopenia  . Blood transfusion    platelets 12/12  . 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 Coronary artery disease    stent 06  . Cough   . DDD (degenerative disc disease), cervical   . Diabetes mellitus    borderline-controlled by diet  . ED (erectile dysfunction)   . Essential hypertension   . Fatigue   . GERD (gastroesophageal reflux disease)   . Heart murmur   . Hyperlipidemia   . Hypertension   . Hypothyroidism   . IBS (irritable bowel syndrome)   . Leg swelling    due to lymphedema  . Lymphedema: RLE 02/19/2015  . Obesity   . PMR (polymyalgia rheumatica) ([place]) 02/19/2015  . Pneumonia   . Polymyalgia ([place])   . Thrombocytopenia ([place])   . Thyroid disease   . Trouble swallowing   . Vitamin D deficiency     Past Surgical History:  Procedure Laterality Date  . APPENDECTOMY  1967  . BONE MARROW BIOPSY     2011-at [facility], 01/2010-at [facility], [facility]  . CARDIAC CATHETERIZATION   01/31/2005   EF 60-65%  . cataract Bilateral   . CORONARY ANGIOPLASTY WITH STENT PLACEMENT  2006   STENTING OF HIS CORONARY ARTERY  . EDG Dilitation  [id number]  . 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 LYMPH NODE BIOPSY  03/15/2011   Procedure: LYMPH NODE BIOPSY;  Surgeon: Earnstine Regal, MD;  Location: [facility];  Service: General;  Laterality: Right;  Right Anterior Cervical Lymph Node Excisional Biopsy   . lymph node removal  2011   right groin  . NASAL SINUS SURGERY  2005  . TONSILLECTOMY AND ADENOIDECTOMY    . US ECHOCARDIOGRAPHY  02/28/2005   EF 55-60%    There were no vitals filed for this visit.      Subjective Assessment - 08/19/16 0930    Subjective Went to see Dr. Carles Collet yesterday.  She is not thinking specifically Parkinson's disease.  We are planning to have the [place].  Potentially primary progressive aphasia.   Pertinent History Leukemia (AML) dx Nov. 2016 (in remission); LUE intention tremor > 5 years; polymyalgia, DM, CAD, dysarthria   Limitations Walking   Patient Stated Goals Pt's goal for therapy is to improve balance and to walk again.   Currently in  Pain? No/denies   Pain Onset More than a month ago                         [facility] Adult PT Treatment/Exercise - 08/19/16 0933      Transfers   Transfers Sit to Stand;Stand to Sit   Sit to Stand 4: Min guard   Stand to Sit 4: Min guard   Comments Squat pivot transfer wheelchair<>chair, x 2 reps with min guard, extra time due to lower extremity tremors     Ambulation/Gait   Ambulation/Gait Yes   Ambulation/Gait Assistance 4: Min guard   Ambulation Distance (Feet) 80 Feet  then 120 ft   Assistive device Rolling walker   Gait Pattern Step-through pattern;Decreased step length - right;Decreased step length - left;Decreased weight shift to right  Slowed gait pattern, increased RLE festination with turns   Ambulation Surface Level;Indoor      Knee/Hip Exercises: Stretches   Active Hamstring Stretch Right;Left;3 reps;30 seconds  seated foot propped on floor     Knee/Hip Exercises: Aerobic   Stepper SEated Stepper, Level 1.3 4 extremities x 5 minutes, with RPM 40-60     Knee/Hip Exercises: Seated   Long Arc Quad Strengthening;Right;Left;2 sets;10 reps;Weights   Long Arc Quad Weight 2 lbs.   Heel Slides Strengthening;Right;Left;2 sets;10 reps  green theraband   Ball Squeeze 2 sets of 10 reps   Marching Limitations 2 sets of 10 reps, 2# weights                PT Education - 08/19/16 1247    Education provided Yes   Education Details Additions for seated lower extremity strengthening to HEP   Person(s) Educated Patient;Spouse   Methods Explanation;Demonstration;Handout   Comprehension Verbalized understanding;Returned demonstration          PT Short Term Goals - 07/07/16 1232      PT SHORT TERM GOAL #1   Title Pt will perform HEP with family supervision for improved posture, strength, gait and balance.  TARGET 07/07/16   Time 4   Period Weeks   Status Achieved     PT SHORT TERM GOAL #2   Title Berg Balance score to be assessed, with goal to be written as appropriate. 2/28 score 32/56; Goal to increase to 37/56 to demonstrate improved balance/safety.    Baseline 38/56 on 07/07/16   Time 4   Period Weeks   Status Achieved     PT SHORT TERM GOAL #3   Title Pt will improve TUG score to less than or equal to 30 seconds for decreased fall risk.   Baseline 43.72 sec on 07/05/16   Time 4   Period Weeks   Status Not Met     PT SHORT TERM GOAL #4   Title Pt will perform at least 8 of 10 reps of sit<>stand transfers with minimal UE support, modified independently for improved safety and efficiency with transfers.   Time 4   Period Weeks   Status Achieved     PT SHORT TERM GOAL #5   Title Pt will ambulate at least 200 ft, using RW, with min assistance, for improved safety and efficiency with gait.   Time 4    Period Weeks   Status Achieved     PT SHORT TERM GOAL #6   Title Pt will negotiate one step no handrail, with min assistance, for safe negotiation of step into and out of home.   Time 4  Period Weeks   Status Achieved           PT Long Term Goals - 08/05/16 1251      PT LONG TERM GOAL #1   Title Pt and wife will verbalize understanding of fall prevention in the home environment.  TARGT 08/06/16   Time 8   Period Weeks   Status Achieved     PT LONG TERM GOAL #2   Title Pt will improve TUG score to less than or equal to 20 seconds for decreased fall risk.     Baseline TUG 46.33 sec at best 08/04/16   Time 8   Period Weeks   Status Not Met     PT LONG TERM GOAL #3   Title Pt will improve gait velocity to at least 2 ft/sec for decreased fall risk and improved gait efficiency and safety.   Baseline 1.58 ft/sec on 08/05/16   Time 8   Period Weeks   Status Not Met     PT LONG TERM GOAL #4   Title Pt will improve Berg Balance score by at least 10 points (from eval) for decreased fall risk.   Baseline eval Berg 32/56; Berg on 08/04/16 44/56   Time 8   Period Weeks   Status Achieved     PT LONG TERM GOAL #5   Title Pt will ambulate at least 500 ft using RW, with supervision, for improved gait efficiency and safety.   Time 8   Period Weeks   Status Achieved     Additional Long Term Goals   Additional Long Term Goals Yes     PT LONG TERM GOAL #6   Title Pt will improve TUG score to less than or equal to 35 seconds for decreased fall risk. TARGET 09/04/16   Time 4  per recert 9/76/73   Period Weeks   Status New     PT LONG TERM GOAL #7   Title Pt will improve gait velocity to at least 1.8 ft/sec for decreased fall risk.  TARGET 09/04/16   Time 4  Per recert 08/05/35   Period Weeks   Status New     PT LONG TERM GOAL #8   Title Pt will improve Berg Balance score to at least 48/56 for decreased fall risk.  TARGET 09/04/16   Time 4  Per recert 12/19/38   Period Weeks    Status New     PT LONG TERM GOAL  #9   TITLE Pt/wife will verbalize community fitness options upon D/C from PT.  TARGET 09/04/16   Time 4  per recert 9/73/53   Period Weeks   Status New               Plan - 08/19/16 1249    Clinical Impression Statement Additions made to HEP for lower extremity strengthening, also focused on use of seated stepper during session to begin to discuss/prepare patient for community fitness-type setting upon discharge from PT.  Pt able to tolerate exercises well, but gait at end of session notably slower with fatigue.   Rehab Potential Good   PT Frequency 2x / week   PT Duration 4 weeks  per recert 2/99/24   PT Treatment/Interventions ADLs/Self Care Home Management;Functional mobility training;Gait training;Therapeutic activities;Therapeutic exercise;Balance training;Neuromuscular re-education;Patient/family education   PT Next Visit Plan Review seated HEP as needed.  standing blaance activities as able; begin to discuss POC beyond next 2 weeks-?community fitness; GCODE next visit   PT Home Exercise Plan  PWR! Moves in sitting, repeated sit<>stand transfers, ant-post and lateral wt-shifts   Consulted and Agree with Plan of Care Patient;Family member/caregiver   Family Member Consulted wife      Patient will benefit from skilled therapeutic intervention in order to improve the following deficits and impairments:  Abnormal gait, Decreased activity tolerance, Decreased balance, Decreased mobility, Decreased strength, Difficulty walking, Impaired tone, Postural dysfunction  Visit Diagnosis: Muscle weakness (generalized)  Other abnormalities of gait and mobility     Problem List Patient Active Problem List   Diagnosis Date Noted  . AML (acute myelogenous leukemia) ([place]) 02/20/2015  . Leukocytosis 02/19/2015  . Hepatosplenomegaly 02/19/2015  . Lymphedema: RLE 02/19/2015  . PMR (polymyalgia rheumatica) ([facility]) 02/19/2015  . Cerebral thrombosis with  cerebral infarction ([place]) 10/29/2014  . GERD (gastroesophageal reflux disease) 10/28/2014  . Diabetes mellitus without complication ([place]) 00/86/7619  . History of GI bleed 10/28/2014  . Vertigo 10/28/2014  . Diarrhea 10/28/2014  . Essential hypertension   . Stroke with cerebral ischemia ([place])   . Acute GI bleeding 04/16/2011  . Skin cancer 04/15/2011  . Thrombocytopenia ([place]) 03/28/2011  . Lymphadenopathy, neck 03/08/2011  . CAD (coronary artery disease) 01/13/2011  . Hypothyroidism 03/29/2010  . SPLENOMEGALY 03/29/2010  . HEPATOMEGALY, HX OF 03/29/2010  . FEVER, HX OF 03/29/2010  . HLD (hyperlipidemia) 09/07/2007  . HYPERTENSION 09/07/2007  . COUGH 09/07/2007    Cephas Revard W. 08/19/2016, 12:52 PM  Frazier Butt., PT  [facility] [address] [place], [place], [address] Phone: [phone]   Fax:  [phone]  Name: EULA MAZZOLA MRN: [id number] Date of Birth: October 16, 1940

## 2016-08-22 ENCOUNTER — Telehealth: Payer: Self-pay | Admitting: Neurology

## 2016-08-22 ENCOUNTER — Ambulatory Visit: Payer: Medicare Other | Admitting: Physical Therapy

## 2016-08-22 NOTE — Telephone Encounter (Signed)
Spoke with Nhel P. At [facility] to check on coverage for DAT Scan. He verified x3 that if a patient has medicare supplement coverage with BCBS that they have to follow medicare guidelines and patient's Dat Scan would be covered.

## 2016-08-22 NOTE — Telephone Encounter (Signed)
Great.  Put him on list to do this in few weeks.

## 2016-08-23 ENCOUNTER — Ambulatory Visit: Payer: Medicare Other | Admitting: Physical Therapy

## 2016-08-23 DIAGNOSIS — M6281 Muscle weakness (generalized): Secondary | ICD-10-CM | POA: Diagnosis not present

## 2016-08-23 DIAGNOSIS — R2689 Other abnormalities of gait and mobility: Secondary | ICD-10-CM

## 2016-08-23 DIAGNOSIS — R2681 Unsteadiness on feet: Secondary | ICD-10-CM | POA: Diagnosis not present

## 2016-08-23 DIAGNOSIS — R293 Abnormal posture: Secondary | ICD-10-CM | POA: Diagnosis not present

## 2016-08-23 NOTE — Therapy (Signed)
[facility] [address] [place], [place], [address] Phone: [phone]   Fax:  [phone]  Physical Therapy Treatment  Patient Details  Name: Nathan Kemp MRN: [id number] Date of Birth: 02-17-1941 Referring Provider: Sonia Baller  Encounter Date: 08/23/2016      PT End of Session - 08/23/16 1025    Visit Number 20   Number of Visits 25   Date for PT Re-Evaluation 85/27/78  Per recert 2/42/35   Authorization Type Medicare Primary; BCBS secondary-GCODE every 10th visit   PT Start Time 0930   PT Stop Time 1016   PT Time Calculation (min) 46 min   Equipment Utilized During Treatment Gait belt   Activity Tolerance Patient tolerated treatment well   Behavior During Therapy [facility] for tasks assessed/performed      Past Medical History:  Diagnosis Date  . Acute GI bleeding 04/16/2011  . Anemia   . Anorexia   . Asthma    when  have congstion  of chest  . Blood dyscrasia 06/2009   thrombocytopenia  . Blood transfusion    platelets 12/12  . 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 Coronary artery disease    stent 06  . Cough   . DDD (degenerative disc disease), cervical   . Diabetes mellitus    borderline-controlled by diet  . ED (erectile dysfunction)   . Essential hypertension   . Fatigue   . GERD (gastroesophageal reflux disease)   . Heart murmur   . Hyperlipidemia   . Hypertension   . Hypothyroidism   . IBS (irritable bowel syndrome)   . Leg swelling    due to lymphedema  . Lymphedema: RLE 02/19/2015  . Obesity   . PMR (polymyalgia rheumatica) ([place]) 02/19/2015  . Pneumonia   . Polymyalgia ([place])   . Thrombocytopenia ([place])   . Thyroid disease   . Trouble swallowing   . Vitamin D deficiency     Past Surgical History:  Procedure Laterality Date  . APPENDECTOMY  1967  . BONE MARROW BIOPSY     2011-at [facility], 01/2010-at [facility], [facility]  . CARDIAC CATHETERIZATION   01/31/2005   EF 60-65%  . cataract Bilateral   . CORONARY ANGIOPLASTY WITH STENT PLACEMENT  2006   STENTING OF HIS CORONARY ARTERY  . EDG Dilitation  [id number]  . 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 LYMPH NODE BIOPSY  03/15/2011   Procedure: LYMPH NODE BIOPSY;  Surgeon: Earnstine Regal, MD;  Location: [facility];  Service: General;  Laterality: Right;  Right Anterior Cervical Lymph Node Excisional Biopsy   . lymph node removal  2011   right groin  . NASAL SINUS SURGERY  2005  . TONSILLECTOMY AND ADENOIDECTOMY    . US ECHOCARDIOGRAPHY  02/28/2005   EF 55-60%    There were no vitals filed for this visit.      Subjective Assessment - 08/23/16 0925    Subjective Nothing new over the weekend.  Haven't got the weights yet, but I've been doing the exercises without the weights.   Patient is accompained by: Family member   Pertinent History Leukemia (AML) dx Nov. 2016 (in remission); LUE intention tremor > 5 years; polymyalgia, DM, CAD, dysarthria   Limitations Walking   Patient Stated Goals Pt's goal for therapy is to improve balance and to walk again.   Currently  in Pain? No/denies   Pain Onset More than a month ago                         [facility] Adult PT Treatment/Exercise - 08/23/16 0932      Ambulation/Gait   Ambulation/Gait Yes   Ambulation/Gait Assistance 4: Min guard   Ambulation/Gait Assistance Details Requires extra time, widened BOS for turning   Ambulation Distance (Feet) 230 Feet  then 70   Assistive device Rolling walker   Gait Pattern Step-through pattern;Decreased step length - right;Decreased step length - left;Decreased weight shift to right   Ambulation Surface Level;Indoor   Gait velocity 16.47 sec = 1.99 ft/sec     Timed Up and Go Test   TUG Normal TUG   Normal TUG (seconds) 37.94     High Level Balance   High Level Balance Activities Side stepping;Backward walking  Forward/back walking,  each 3 reps length of counter   High Level Balance Comments At counter, alternating kicks x 10 to side, back, forward with 1-2 UE support; marching in place x 10 reps with 1 UE support.       Self-Care   Self-Care Other Self-Care Comments   Other Self-Care Comments  Discussed POC, with plans for discharge next weekd; discussed options for community fitness, including options for walking program, as well as return to community fitness on a regular basis, including [employer], [employer]             Balance Exercises - 08/23/16 1022      Balance Exercises: Standing   Tandem Stance Eyes open;1 rep;30 secs   SLS Eyes open;Solid surface;Upper extremity support 1;2 reps;10 secs           PT Education - 08/23/16 1023    Education provided Yes   Education Details Provided 2nd issue of balance handout-originally from 3/22; discussed community fitness options, walking program (trying [employer], [employer], [facility], or [employer] walking track for consistent walking); discussed possibility of discharge next week.   Person(s) Educated Patient;Spouse   Methods Explanation;Demonstration;Handout   Comprehension Verbalized understanding;Returned demonstration          PT Short Term Goals - 07/07/16 1232      PT SHORT TERM GOAL #1   Title Pt will perform HEP with family supervision for improved posture, strength, gait and balance.  TARGET 07/07/16   Time 4   Period Weeks   Status Achieved     PT SHORT TERM GOAL #2   Title Berg Balance score to be assessed, with goal to be written as appropriate. 2/28 score 32/56; Goal to increase to 37/56 to demonstrate improved balance/safety.    Baseline 38/56 on 07/07/16   Time 4   Period Weeks   Status Achieved     PT SHORT TERM GOAL #3   Title Pt will improve TUG score to less than or equal to 30 seconds for decreased fall risk.   Baseline 43.72 sec on 07/05/16   Time 4   Period Weeks   Status Not Met     PT SHORT TERM GOAL  #4   Title Pt will perform at least 8 of 10 reps of sit<>stand transfers with minimal UE support, modified independently for improved safety and efficiency with transfers.   Time 4   Period Weeks   Status Achieved     PT SHORT TERM GOAL #5   Title Pt will ambulate at least 200 ft,  using RW, with min assistance, for improved safety and efficiency with gait.   Time 4   Period Weeks   Status Achieved     PT SHORT TERM GOAL #6   Title Pt will negotiate one step no handrail, with min assistance, for safe negotiation of step into and out of home.   Time 4   Period Weeks   Status Achieved           PT Long Term Goals - 08/23/16 1033      PT LONG TERM GOAL #1   Title Pt and wife will verbalize understanding of fall prevention in the home environment.  TARGT 08/06/16   Time 8   Period Weeks   Status Achieved     PT LONG TERM GOAL #2   Title Pt will improve TUG score to less than or equal to 20 seconds for decreased fall risk.     Baseline TUG 46.33 sec at best 08/04/16   Time 8   Period Weeks   Status Not Met     PT LONG TERM GOAL #3   Title Pt will improve gait velocity to at least 2 ft/sec for decreased fall risk and improved gait efficiency and safety.   Baseline 1.58 ft/sec on 08/05/16   Time 8   Period Weeks   Status Not Met     PT LONG TERM GOAL #4   Title Pt will improve Berg Balance score by at least 10 points (from eval) for decreased fall risk.   Baseline eval Berg 32/56; Berg on 08/04/16 44/56   Time 8   Period Weeks   Status Achieved     PT LONG TERM GOAL #5   Title Pt will ambulate at least 500 ft using RW, with supervision, for improved gait efficiency and safety.   Time 8   Period Weeks   Status Achieved     PT LONG TERM GOAL #6   Title Pt will improve TUG score to less than or equal to 35 seconds for decreased fall risk. TARGET 09/04/16   Time 4  per recert 3/41/93   Period Weeks   Status On-going     PT LONG TERM GOAL #7   Title Pt will improve  gait velocity to at least 1.8 ft/sec for decreased fall risk.  TARGET 09/04/16   Time 4  Per recert 7/90/24   Period Weeks   Status Achieved     PT LONG TERM GOAL #8   Title Pt will improve Berg Balance score to at least 48/56 for decreased fall risk.  TARGET 09/04/16   Time 4  Per recert 0/97/35   Period Weeks   Status On-going     PT LONG TERM GOAL  #9   TITLE Pt/wife will verbalize community fitness options upon D/C from PT.  TARGET 09/04/16   Time 4  per recert 07/15/90   Period Weeks   Status On-going               Plan - 08/23/16 1025    Clinical Impression Statement Pt feels comfortable with lower extremity strengthening HEP at home, but has not gotten weights.  Re-added standing balance exercises to HEP after performing in session today.  Had discussion regarding POC and likely discharge next week..  Pt is making some progress on gait velocity and TUG scores, but wife reports continued difficulty with consistent walking at home.  Pt will continue to benefit from skilled PT to address strength, balance, gait  with planned transition to community fitness after next week.   Rehab Potential Good   PT Frequency 2x / week   PT Duration 4 weeks  per recert 08/05/16   PT Treatment/Interventions ADLs/Self Care Home Management;Functional mobility training;Gait training;Therapeutic activities;Therapeutic exercise;Balance training;Neuromuscular re-education;Patient/family education   PT Next Visit Plan Check on status of wife's checking into various options for community fitness and walking.  Review standing balance HEP   PT Home Exercise Plan PWR! Moves in sitting, repeated sit<>stand transfers, ant-post and lateral wt-shifts   Consulted and Agree with Plan of Care Patient;Family member/caregiver   Family Member Consulted wife      Patient will benefit from skilled therapeutic intervention in order to improve the following deficits and impairments:  Abnormal gait, Decreased activity  tolerance, Decreased balance, Decreased mobility, Decreased strength, Difficulty walking, Impaired tone, Postural dysfunction  Visit Diagnosis: Other abnormalities of gait and mobility  Unsteadiness on feet       G-Codes - 2016/09/20 1031    Functional Assessment Tool Used (Outpatient Only) TUG 37.94 sec, gait velocity 1.99 ft/sec; no reported falls   Functional Limitation Mobility: Walking and moving around   Mobility: Walking and Moving Around Current Status [phone]) At least 40 percent but less than 60 percent impaired, limited or restricted   Mobility: Walking and Moving Around Goal Status [phone]) At least 20 percent but less than 40 percent impaired, limited or restricted      Problem List Patient Active Problem List   Diagnosis Date Noted  . AML (acute myelogenous leukemia) (HCC) 02/20/2015  . Leukocytosis 02/19/2015  . Hepatosplenomegaly 02/19/2015  . Lymphedema: RLE 02/19/2015  . PMR (polymyalgia rheumatica) (HCC) 02/19/2015  . Cerebral thrombosis with cerebral infarction (HCC) 10/29/2014  . GERD (gastroesophageal reflux disease) 10/28/2014  . Diabetes mellitus without complication (HCC) 10/28/2014  . History of GI bleed 10/28/2014  . Vertigo 10/28/2014  . Diarrhea 10/28/2014  . Essential hypertension   . Stroke with cerebral ischemia (HCC)   . Acute GI bleeding 04/16/2011  . Skin cancer 04/15/2011  . Thrombocytopenia (HCC) 03/28/2011  . Lymphadenopathy, neck 03/08/2011  . CAD (coronary artery disease) 01/13/2011  . Hypothyroidism 03/29/2010  . SPLENOMEGALY 03/29/2010  . HEPATOMEGALY, HX OF 03/29/2010  . FEVER, HX OF 03/29/2010  . HLD (hyperlipidemia) 09/07/2007  . HYPERTENSION 09/07/2007  . COUGH 09/07/2007    MARRIOTT,AMY W. September 20, 2016, 10:34 AM Gean Maidens., PT [facility] [address] [place], [place], [address] Phone: [phone]   Fax:  [phone]  Name: Nathan Kemp MRN:  [id number] Date of Birth: 1941-01-31   Physical Therapy Progress Note  Dates of Reporting Period: 06/07/16 (eval), 07/12/16 (last G-code) to 09/20/2016  Objective Reports of Subjective Statement: No reports of falls at home  Objective Measurements: TUG score 37.94 (improved from 57 sec last g-code); gait velocity 1.99 ft/sec (improved from 1.5 ft/sec eval)  Goal Update: Pt has met 3 of 5 original LTGs.  New LTGs were added on 08/05/16 recert, with pt meeting LTG for gait velocity today.  Pt is progressing towards other LTGs.  Plan: Continue PT 3 remaining visits, with plans to review/update HEP as needed, focus on consistency of walking program at home/community center and plans for continued community fitness, with likely discharge next week.  Reason Skilled Services are Required: Pt is progressing slowly with functional mobility measures, but he has not had any falls.  He remains at fall risk per TUG and Berg scores.   Lonia Blood, PT  08/23/16 10:38 AM Phone: [phone] Fax: [phone]

## 2016-08-24 ENCOUNTER — Ambulatory Visit: Payer: Medicare Other | Admitting: Physical Therapy

## 2016-08-25 ENCOUNTER — Ambulatory Visit: Payer: Medicare Other | Admitting: Physical Therapy

## 2016-08-25 DIAGNOSIS — R2681 Unsteadiness on feet: Secondary | ICD-10-CM | POA: Diagnosis not present

## 2016-08-25 DIAGNOSIS — M6281 Muscle weakness (generalized): Secondary | ICD-10-CM

## 2016-08-25 DIAGNOSIS — R293 Abnormal posture: Secondary | ICD-10-CM | POA: Diagnosis not present

## 2016-08-25 DIAGNOSIS — R2689 Other abnormalities of gait and mobility: Secondary | ICD-10-CM

## 2016-08-25 NOTE — Therapy (Signed)
[facility] [address] [place], [place], [address] Phone: [phone]   Fax:  [phone]  Physical Therapy Treatment  Patient Details  Name: Nathan Kemp MRN: [id number] Date of Birth: [birth year] Referring Provider: Sonia Baller  Encounter Date: 08/25/2016      PT End of Session - 08/25/16 0926    Visit Number 21   Number of Visits 25   Date for PT Re-Evaluation 38/75/64  Per recert 3/32/95   Authorization Type Medicare Primary; BCBS secondary-GCODE every 10th visit   PT Start Time 0848   PT Stop Time 0931   PT Time Calculation (min) 43 min   Equipment Utilized During Treatment Gait belt   Activity Tolerance Patient tolerated treatment well   Behavior During Therapy [facility] for tasks assessed/performed      Past Medical History:  Diagnosis Date  . Acute GI bleeding 04/16/2011  . Anemia   . Anorexia   . Asthma    when  have congstion  of chest  . Blood dyscrasia 06/2009   thrombocytopenia  . Blood transfusion    platelets 12/12  . 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 Coronary artery disease    stent 06  . Cough   . DDD (degenerative disc disease), cervical   . Diabetes mellitus    borderline-controlled by diet  . ED (erectile dysfunction)   . Essential hypertension   . Fatigue   . GERD (gastroesophageal reflux disease)   . Heart murmur   . Hyperlipidemia   . Hypertension   . Hypothyroidism   . IBS (irritable bowel syndrome)   . Leg swelling    due to lymphedema  . Lymphedema: RLE 02/19/2015  . Obesity   . PMR (polymyalgia rheumatica) ([place]) 02/19/2015  . Pneumonia   . Polymyalgia ([place])   . Thrombocytopenia ([place])   . Thyroid disease   . Trouble swallowing   . Vitamin D deficiency     Past Surgical History:  Procedure Laterality Date  . APPENDECTOMY  1967  . BONE MARROW BIOPSY     2011-at [facility], 01/2010-at [facility], [facility]  . CARDIAC CATHETERIZATION   01/31/2005   EF 60-65%  . cataract Bilateral   . CORONARY ANGIOPLASTY WITH STENT PLACEMENT  2006   STENTING OF HIS CORONARY ARTERY  . EDG Dilitation  [id number]  . 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 LYMPH NODE BIOPSY  03/15/2011   Procedure: LYMPH NODE BIOPSY;  Surgeon: Earnstine Regal, MD;  Location: [facility];  Service: General;  Laterality: Right;  Right Anterior Cervical Lymph Node Excisional Biopsy   . lymph node removal  2011   right groin  . NASAL SINUS SURGERY  2005  . TONSILLECTOMY AND ADENOIDECTOMY    . US ECHOCARDIOGRAPHY  02/28/2005   EF 55-60%    There were no vitals filed for this visit.      Subjective Assessment - 08/25/16 0925    Subjective Wife reports going to [facility] yesterday, speaking to [place], and patient plans to return to 1:1 sessions with Alfonse Spruce at [facility] after d/c from PT next week.   Patient is accompained by: Family member   Pertinent History Leukemia (AML) dx Nov. 2016 (in remission); LUE intention tremor > 5 years; polymyalgia, DM, CAD, dysarthria   Limitations Walking   Patient Stated Goals Pt's goal for therapy is to improve balance  and to walk again.   Currently in Pain? No/denies   Pain Onset More than a month ago                         [facility] Adult PT Treatment/Exercise - 08/25/16 0900      Ambulation/Gait   Ambulation/Gait Yes   Ambulation/Gait Assistance 4: Min guard;5: Supervision   Ambulation Distance (Feet) 400 Feet  then 80 ft x 2   Assistive device Rolling walker   Gait Pattern Step-through pattern;Decreased step length - right;Decreased step length - left;Decreased weight shift to right   Ambulation Surface Level;Indoor   Gait Comments With turns to sit, pt slows pace and demo imporved foot clearance and turning ability this visit.     Knee/Hip Exercises: Aerobic   Stepper SEated Stepper, Level 1.3>1.5, 4 extremities x 8 minutes, with RPM 50-70              Balance Exercises - 08/25/16 0901      Balance Exercises: Standing   Retro Gait Upper extremity support;5 reps  Forward/back walking Length of counter   Sidestepping Foam/compliant support;3 reps  length of counter   Marching Limitations marching in place x 10 reps, marching forward /walking back 2 lengths of counter with UE support   Heel Raises Limitations x 10 reps   Toe Raise Limitations x 10 reps   Other Standing Exercises Side kicks x 10, forward kicks x 10, back kicks x 10 alternating legs  Min Guard assistance for all standing balance activities    Needs one sitting rest break during standing exercises.         PT Short Term Goals - 07/07/16 1232      PT SHORT TERM GOAL #1   Title Pt will perform HEP with family supervision for improved posture, strength, gait and balance.  TARGET 07/07/16   Time 4   Period Weeks   Status Achieved     PT SHORT TERM GOAL #2   Title Berg Balance score to be assessed, with goal to be written as appropriate. 2/28 score 32/56; Goal to increase to 37/56 to demonstrate improved balance/safety.    Baseline 38/56 on 07/07/16   Time 4   Period Weeks   Status Achieved     PT SHORT TERM GOAL #3   Title Pt will improve TUG score to less than or equal to 30 seconds for decreased fall risk.   Baseline 43.72 sec on 07/05/16   Time 4   Period Weeks   Status Not Met     PT SHORT TERM GOAL #4   Title Pt will perform at least 8 of 10 reps of sit<>stand transfers with minimal UE support, modified independently for improved safety and efficiency with transfers.   Time 4   Period Weeks   Status Achieved     PT SHORT TERM GOAL #5   Title Pt will ambulate at least 200 ft, using RW, with min assistance, for improved safety and efficiency with gait.   Time 4   Period Weeks   Status Achieved     PT SHORT TERM GOAL #6   Title Pt will negotiate one step no handrail, with min assistance, for safe negotiation of step into and out of home.   Time 4    Period Weeks   Status Achieved           PT Long Term Goals - 08/23/16 3295  PT LONG TERM GOAL #1   Title Pt and wife will verbalize understanding of fall prevention in the home environment.  TARGT 08/06/16   Time 8   Period Weeks   Status Achieved     PT LONG TERM GOAL #2   Title Pt will improve TUG score to less than or equal to 20 seconds for decreased fall risk.     Baseline TUG 46.33 sec at best 08/04/16   Time 8   Period Weeks   Status Not Met     PT LONG TERM GOAL #3   Title Pt will improve gait velocity to at least 2 ft/sec for decreased fall risk and improved gait efficiency and safety.   Baseline 1.58 ft/sec on 08/05/16   Time 8   Period Weeks   Status Not Met     PT LONG TERM GOAL #4   Title Pt will improve Berg Balance score by at least 10 points (from eval) for decreased fall risk.   Baseline eval Berg 32/56; Berg on 08/04/16 44/56   Time 8   Period Weeks   Status Achieved     PT LONG TERM GOAL #5   Title Pt will ambulate at least 500 ft using RW, with supervision, for improved gait efficiency and safety.   Time 8   Period Weeks   Status Achieved     PT LONG TERM GOAL #6   Title Pt will improve TUG score to less than or equal to 35 seconds for decreased fall risk. TARGET 09/04/16   Time 4  per recert 6/56/81   Period Weeks   Status On-going     PT LONG TERM GOAL #7   Title Pt will improve gait velocity to at least 1.8 ft/sec for decreased fall risk.  TARGET 09/04/16   Time 4  Per recert 2/75/17   Period Weeks   Status Achieved     PT LONG TERM GOAL #8   Title Pt will improve Berg Balance score to at least 48/56 for decreased fall risk.  TARGET 09/04/16   Time 4  Per recert 0/01/74   Period Weeks   Status On-going     PT LONG TERM GOAL  #9   TITLE Pt/wife will verbalize community fitness options upon D/C from PT.  TARGET 09/04/16   Time 4  per recert 9/44/96   Period Weeks   Status On-going               Plan - 08/25/16 1034     Clinical Impression Statement Pt consistently reports performing exercises at home, as well as adding in standing balance activities yesterday.  He reports walking for longer times yesterday.  Wife has initially talked with [employer] about transition to community fitness.  Pt appears on track towards LTGs, with likely plans for discharge next week.   Rehab Potential Good   PT Frequency 2x / week   PT Duration 4 weeks  per recert 7/59/16   PT Treatment/Interventions ADLs/Self Care Home Management;Functional mobility training;Gait training;Therapeutic activities;Therapeutic exercise;Balance training;Neuromuscular re-education;Patient/family education   PT Next Visit Plan Begin checking LTGs; make any additions/modifications to HEP; plan  for discharge next week.   PT Home Exercise Plan PWR! Moves in sitting, repeated sit<>stand transfers, ant-post and lateral wt-shifts   Consulted and Agree with Plan of Care Patient;Family member/caregiver   Family Member Consulted wife      Patient will benefit from skilled therapeutic intervention in order to improve the following deficits and impairments:  Abnormal gait, Decreased activity tolerance, Decreased balance, Decreased mobility, Decreased strength, Difficulty walking, Impaired tone, Postural dysfunction  Visit Diagnosis: Unsteadiness on feet  Muscle weakness (generalized)  Other abnormalities of gait and mobility     Problem List Patient Active Problem List   Diagnosis Date Noted  . AML (acute myelogenous leukemia) ([place]) 02/20/2015  . Leukocytosis 02/19/2015  . Hepatosplenomegaly 02/19/2015  . Lymphedema: RLE 02/19/2015  . PMR (polymyalgia rheumatica) (HCC) 02/19/2015  . Cerebral thrombosis with cerebral infarction ([place]) 10/29/2014  . GERD (gastroesophageal reflux disease) 10/28/2014  . Diabetes mellitus without complication ([place]) 81/15/7262  . History of GI bleed 10/28/2014  . Vertigo 10/28/2014  . Diarrhea 10/28/2014  . Essential  hypertension   . Stroke with cerebral ischemia ([place])   . Acute GI bleeding 04/16/2011  . Skin cancer 04/15/2011  . Thrombocytopenia ([place]) 03/28/2011  . Lymphadenopathy, neck 03/08/2011  . CAD (coronary artery disease) 01/13/2011  . Hypothyroidism 03/29/2010  . SPLENOMEGALY 03/29/2010  . HEPATOMEGALY, HX OF 03/29/2010  . FEVER, HX OF 03/29/2010  . HLD (hyperlipidemia) 09/07/2007  . HYPERTENSION 09/07/2007  . COUGH 09/07/2007    Myosha Cuadras W. 08/25/2016, 10:37 AM  Frazier Butt., PT  [facility] [address] [place], [place], [address] Phone: [phone]   Fax:  [phone]  Name: DANNEL RAFTER MRN: [id number] Date of Birth: May 08, 1940

## 2016-08-26 ENCOUNTER — Telehealth: Payer: Self-pay | Admitting: Neurology

## 2016-08-26 NOTE — Telephone Encounter (Signed)
Patient had neuropsych testing from [facility] on 06/29/2016.  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  He felt that semantic dementia and logopenic variant seems less likely.  He felt that CBGD and FTD with motor neuron disease was unlikely.  It stated that the dat and/or PET scan was recommended.  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  He also stated that dopaminergic medications and anticholinesterase medications are not helpful.  Clonazepam was recommended for tremor.  Mild depression was also noted.

## 2016-08-29 ENCOUNTER — Ambulatory Visit: Payer: Medicare Other | Admitting: Physical Therapy

## 2016-08-29 DIAGNOSIS — R2689 Other abnormalities of gait and mobility: Secondary | ICD-10-CM

## 2016-08-29 DIAGNOSIS — R2681 Unsteadiness on feet: Secondary | ICD-10-CM

## 2016-08-29 DIAGNOSIS — M6281 Muscle weakness (generalized): Secondary | ICD-10-CM

## 2016-08-29 DIAGNOSIS — R293 Abnormal posture: Secondary | ICD-10-CM | POA: Diagnosis not present

## 2016-08-29 NOTE — Therapy (Signed)
[facility] [address] [place], [place], [address] Phone: [phone]   Fax:  [phone]  Physical Therapy Treatment  Patient Details  Name: Nathan Kemp MRN: [id number] Date of Birth: 15-Feb-1941 Referring Provider: Sonia Baller  Encounter Date: 08/29/2016      PT End of Session - 08/29/16 0846    Visit Number 22   Number of Visits 25   Date for PT Re-Evaluation 33/35/45  Per recert 10/10/61   Authorization Type Medicare Primary; BCBS secondary-GCODE every 10th visit   PT Start Time 0804   PT Stop Time 0845   PT Time Calculation (min) 41 min   Equipment Utilized During Treatment Gait belt   Activity Tolerance Patient tolerated treatment well;Patient limited by fatigue   Behavior During Therapy [facility] for tasks assessed/performed      Past Medical History:  Diagnosis Date  . Acute GI bleeding 04/16/2011  . Anemia   . Anorexia   . Asthma    when  have congstion  of chest  . Blood dyscrasia 06/2009   thrombocytopenia  . Blood transfusion    platelets 12/12  . 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 Coronary artery disease    stent 06  . Cough   . DDD (degenerative disc disease), cervical   . Diabetes mellitus    borderline-controlled by diet  . ED (erectile dysfunction)   . Essential hypertension   . Fatigue   . GERD (gastroesophageal reflux disease)   . Heart murmur   . Hyperlipidemia   . Hypertension   . Hypothyroidism   . IBS (irritable bowel syndrome)   . Leg swelling    due to lymphedema  . Lymphedema: RLE 02/19/2015  . Obesity   . PMR (polymyalgia rheumatica) ([place]) 02/19/2015  . Pneumonia   . Polymyalgia ([place])   . Thrombocytopenia ([place])   . Thyroid disease   . Trouble swallowing   . Vitamin D deficiency     Past Surgical History:  Procedure Laterality Date  . APPENDECTOMY  1967  . BONE MARROW BIOPSY     2011-at [facility], 01/2010-at [facility], [facility]  .  CARDIAC CATHETERIZATION  01/31/2005   EF 60-65%  . cataract Bilateral   . CORONARY ANGIOPLASTY WITH STENT PLACEMENT  2006   STENTING OF HIS CORONARY ARTERY  . EDG Dilitation  [id number]  . 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 LYMPH NODE BIOPSY  03/15/2011   Procedure: LYMPH NODE BIOPSY;  Surgeon: Earnstine Regal, MD;  Location: [facility]S;  Service: General;  Laterality: Right;  Right Anterior Cervical Lymph Node Excisional Biopsy   . lymph node removal  2011   right groin  . NASAL SINUS SURGERY  2005  . TONSILLECTOMY AND ADENOIDECTOMY    . US ECHOCARDIOGRAPHY  02/28/2005   EF 55-60%    There were no vitals filed for this visit.      Subjective Assessment - 08/29/16 0806    Subjective Feel a little weak this morning.  Have been dizzy a bit over the weekend.  Wife reports they have appointment later this week with physician.   Patient is accompained by: Family member   Pertinent History Leukemia (AML) dx Nov. 2016 (in remission); LUE intention tremor > 5 years; polymyalgia, DM, CAD, dysarthria   Limitations Walking   Patient Stated Goals Pt's goal for therapy is to  improve balance and to walk again.   Currently in Pain? No/denies   Pain Onset More than a month ago                         [facility] Adult PT Treatment/Exercise - 08/29/16 0814      Transfers   Transfers Sit to Stand;Stand to Sit   Sit to Stand 4: Min guard   Stand to Sit 4: Min guard   Number of Reps Other reps (comment);Other sets (comment)  5 reps, from 18, then from 22" (standing on foam)     Ambulation/Gait   Ambulation/Gait Yes   Ambulation/Gait Assistance 4: Min guard;5: Supervision   Ambulation/Gait Assistance Details Pt requires increased cues today for improved foot clearance and heel strike   Ambulation Distance (Feet) 200 Feet  then 115, then 100 ft, 40 ft x 2   Assistive device Rolling walker   Gait Pattern Step-through pattern;Decreased  step length - right;Decreased step length - left;Decreased weight shift to right;Poor foot clearance - left;Poor foot clearance - right   Ambulation Surface Level;Indoor   Gait velocity 25.03 sec = 1.31 ft/sec     Timed Up and Go Test   TUG Normal TUG   Normal TUG (seconds) 42.72     Knee/Hip Exercises: Aerobic   Other Aerobic Seated foot pedaler, x 5 minutes, moderate resistance  Backwards x 1 min, 30 sec; moderate resistance     Discussed recumbent bike in addition to seated stepper as option for lower extremity strengthening.  After seated foot pedaler:  HR 86 bpm, O2 sats:  90-94%   Neuro Re-education: In addition to transfers on solid and compliant surface as above:     Balance Exercises - 08/29/16 1106      Balance Exercises: Standing   Other Standing Exercises Standing on foam with UE support at walker:  head turns x 10 reps, marching in place x 10 reps, then alternating UE lifts to decrease UE support at walker, x 10 reps             PT Short Term Goals - 07/07/16 1232      PT SHORT TERM GOAL #1   Title Pt will perform HEP with family supervision for improved posture, strength, gait and balance.  TARGET 07/07/16   Time 4   Period Weeks   Status Achieved     PT SHORT TERM GOAL #2   Title Berg Balance score to be assessed, with goal to be written as appropriate. 2/28 score 32/56; Goal to increase to 37/56 to demonstrate improved balance/safety.    Baseline 38/56 on 07/07/16   Time 4   Period Weeks   Status Achieved     PT SHORT TERM GOAL #3   Title Pt will improve TUG score to less than or equal to 30 seconds for decreased fall risk.   Baseline 43.72 sec on 07/05/16   Time 4   Period Weeks   Status Not Met     PT SHORT TERM GOAL #4   Title Pt will perform at least 8 of 10 reps of sit<>stand transfers with minimal UE support, modified independently for improved safety and efficiency with transfers.   Time 4   Period Weeks   Status Achieved     PT SHORT  TERM GOAL #5   Title Pt will ambulate at least 200 ft, using RW, with min assistance, for improved safety and efficiency with gait.   Time  4   Period Weeks   Status Achieved     PT SHORT TERM GOAL #6   Title Pt will negotiate one step no handrail, with min assistance, for safe negotiation of step into and out of home.   Time 4   Period Weeks   Status Achieved           PT Long Term Goals - 08/23/16 1033      PT LONG TERM GOAL #1   Title Pt and wife will verbalize understanding of fall prevention in the home environment.  TARGT 08/06/16   Time 8   Period Weeks   Status Achieved     PT LONG TERM GOAL #2   Title Pt will improve TUG score to less than or equal to 20 seconds for decreased fall risk.     Baseline TUG 46.33 sec at best 08/04/16   Time 8   Period Weeks   Status Not Met     PT LONG TERM GOAL #3   Title Pt will improve gait velocity to at least 2 ft/sec for decreased fall risk and improved gait efficiency and safety.   Baseline 1.58 ft/sec on 08/05/16   Time 8   Period Weeks   Status Not Met     PT LONG TERM GOAL #4   Title Pt will improve Berg Balance score by at least 10 points (from eval) for decreased fall risk.   Baseline eval Berg 32/56; Berg on 08/04/16 44/56   Time 8   Period Weeks   Status Achieved     PT LONG TERM GOAL #5   Title Pt will ambulate at least 500 ft using RW, with supervision, for improved gait efficiency and safety.   Time 8   Period Weeks   Status Achieved     PT LONG TERM GOAL #6   Title Pt will improve TUG score to less than or equal to 35 seconds for decreased fall risk. TARGET 09/04/16   Time 4  per recert 9/37/34   Period Weeks   Status On-going     PT LONG TERM GOAL #7   Title Pt will improve gait velocity to at least 1.8 ft/sec for decreased fall risk.  TARGET 09/04/16   Time 4  Per recert 2/87/68   Period Weeks   Status Achieved     PT LONG TERM GOAL #8   Title Pt will improve Berg Balance score to at least 48/56 for  decreased fall risk.  TARGET 09/04/16   Time 4  Per recert 05/02/70   Period Weeks   Status On-going     PT LONG TERM GOAL  #9   TITLE Pt/wife will verbalize community fitness options upon D/C from PT.  TARGET 09/04/16   Time 4  per recert 10/05/33   Period Weeks   Status On-going               Plan - 08/29/16 1107    Clinical Impression Statement Pt not feeling as well today, and appears to be moving slower than usual with gait velocity and TUG scores.  Pt's wife reports having set up ACT for week of 5/28 and would like to continue appts through next week (pt is scheduled and wife concerned about recent bout of fatigue-will see MD later this week).  Pt performs lower extremity strengthening and standing balance activities today.  Will further work on balance, strength and gait/discuss upcoming discharge in 1-3 visits.   Rehab Potential Good  PT Frequency 2x / week   PT Duration 4 weeks  per recert 6/65/99   PT Treatment/Interventions ADLs/Self Care Home Management;Functional mobility training;Gait training;Therapeutic activities;Therapeutic exercise;Balance training;Neuromuscular re-education;Patient/family education   PT Next Visit Plan foot pedaler versus seated stepper for lower extremity strengthening, standing balance-lessening UE support/compliant surface; discuss discharge this week or next   PT Home Exercise Plan PWR! Moves in sitting, repeated sit<>stand transfers, ant-post and lateral wt-shifts   Consulted and Agree with Plan of Care Patient;Family member/caregiver   Family Member Consulted wife      Patient will benefit from skilled therapeutic intervention in order to improve the following deficits and impairments:  Abnormal gait, Decreased activity tolerance, Decreased balance, Decreased mobility, Decreased strength, Difficulty walking, Impaired tone, Postural dysfunction  Visit Diagnosis: Muscle weakness (generalized)  Other abnormalities of gait and  mobility  Unsteadiness on feet     Problem List Patient Active Problem List   Diagnosis Date Noted  . AML (acute myelogenous leukemia) ([place]) 02/20/2015  . Leukocytosis 02/19/2015  . Hepatosplenomegaly 02/19/2015  . Lymphedema: RLE 02/19/2015  . PMR (polymyalgia rheumatica) (HCC) 02/19/2015  . Cerebral thrombosis with cerebral infarction ([place]) 10/29/2014  . GERD (gastroesophageal reflux disease) 10/28/2014  . Diabetes mellitus without complication ([place]) 35/70/1779  . History of GI bleed 10/28/2014  . Vertigo 10/28/2014  . Diarrhea 10/28/2014  . Essential hypertension   . Stroke with cerebral ischemia ([place])   . Acute GI bleeding 04/16/2011  . Skin cancer 04/15/2011  . Thrombocytopenia ([place]) 03/28/2011  . Lymphadenopathy, neck 03/08/2011  . CAD (coronary artery disease) 01/13/2011  . Hypothyroidism 03/29/2010  . SPLENOMEGALY 03/29/2010  . HEPATOMEGALY, HX OF 03/29/2010  . FEVER, HX OF 03/29/2010  . HLD (hyperlipidemia) 09/07/2007  . HYPERTENSION 09/07/2007  . COUGH 09/07/2007    Clarivel Callaway W. 08/29/2016, 11:11 AM  Frazier Butt., PT [facility] [address] [place], [place], [address] Phone: [phone]   Fax:  [phone]  Name: Nathan Kemp MRN: [id number] Date of Birth: 1940/12/16

## 2016-08-30 ENCOUNTER — Telehealth: Payer: Self-pay | Admitting: Neurology

## 2016-08-30 ENCOUNTER — Ambulatory Visit: Payer: Medicare Other | Admitting: Physical Therapy

## 2016-08-30 DIAGNOSIS — R251 Tremor, unspecified: Secondary | ICD-10-CM

## 2016-08-30 NOTE — Telephone Encounter (Signed)
Caller: Patient's wife  Urgent? No  Reason for the call: She was checking to see if notes was received from [place]? Thanks

## 2016-08-30 NOTE — Telephone Encounter (Signed)
DAT Scan order entered. Message sent to April Pait to schedule.

## 2016-08-30 NOTE — Telephone Encounter (Signed)
Patient's wife made aware we did receive those results.

## 2016-08-31 ENCOUNTER — Ambulatory Visit: Payer: Medicare Other | Admitting: Physical Therapy

## 2016-09-01 ENCOUNTER — Ambulatory Visit: Payer: Medicare Other | Admitting: Physical Therapy

## 2016-09-01 DIAGNOSIS — R293 Abnormal posture: Secondary | ICD-10-CM

## 2016-09-01 DIAGNOSIS — R2681 Unsteadiness on feet: Secondary | ICD-10-CM | POA: Diagnosis not present

## 2016-09-01 DIAGNOSIS — R2689 Other abnormalities of gait and mobility: Secondary | ICD-10-CM

## 2016-09-01 DIAGNOSIS — M6281 Muscle weakness (generalized): Secondary | ICD-10-CM | POA: Diagnosis not present

## 2016-09-01 NOTE — Therapy (Signed)
[facility] [address] [place], [place], [address] Phone: [phone]   Fax:  [phone]  Physical Therapy Treatment  Patient Details  Name: Nathan Kemp MRN: [id number] Date of Birth: 04/05/1941 Referring Provider: Sonia Baller  Encounter Date: 09/01/2016      PT End of Session - 09/01/16 1137    Visit Number 23   Number of Visits 25   Date for PT Re-Evaluation 56/38/93  Per recert 7/34/28   Authorization Type Medicare Primary; BCBS secondary-GCODE every 10th visit   PT Start Time 0804   PT Stop Time 0845   PT Time Calculation (min) 41 min   Equipment Utilized During Treatment Gait belt   Activity Tolerance Patient tolerated treatment well   Behavior During Therapy [facility] for tasks assessed/performed      Past Medical History:  Diagnosis Date  . Acute GI bleeding 04/16/2011  . Anemia   . Anorexia   . Asthma    when  have congstion  of chest  . Blood dyscrasia 06/2009   thrombocytopenia  . Blood transfusion    platelets 12/12  . 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 Coronary artery disease    stent 06  . Cough   . DDD (degenerative disc disease), cervical   . Diabetes mellitus    borderline-controlled by diet  . ED (erectile dysfunction)   . Essential hypertension   . Fatigue   . GERD (gastroesophageal reflux disease)   . Heart murmur   . Hyperlipidemia   . Hypertension   . Hypothyroidism   . IBS (irritable bowel syndrome)   . Leg swelling    due to lymphedema  . Lymphedema: RLE 02/19/2015  . Obesity   . PMR (polymyalgia rheumatica) ([place]) 02/19/2015  . Pneumonia   . Polymyalgia ([place])   . Thrombocytopenia ([place])   . Thyroid disease   . Trouble swallowing   . Vitamin D deficiency     Past Surgical History:  Procedure Laterality Date  . APPENDECTOMY  1967  . BONE MARROW BIOPSY     2011-at [facility], 01/2010-at [facility], [facility]  . CARDIAC CATHETERIZATION   01/31/2005   EF 60-65%  . cataract Bilateral   . CORONARY ANGIOPLASTY WITH STENT PLACEMENT  2006   STENTING OF HIS CORONARY ARTERY  . EDG Dilitation  [id number]  . 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 LYMPH NODE BIOPSY  03/15/2011   Procedure: LYMPH NODE BIOPSY;  Surgeon: Earnstine Regal, MD;  Location: [facility]S;  Service: General;  Laterality: Right;  Right Anterior Cervical Lymph Node Excisional Biopsy   . lymph node removal  2011   right groin  . NASAL SINUS SURGERY  2005  . TONSILLECTOMY AND ADENOIDECTOMY    . US ECHOCARDIOGRAPHY  02/28/2005   EF 55-60%    There were no vitals filed for this visit.      Subjective Assessment - 09/01/16 0806    Subjective Nothing new this week.  Feel better this morning.  Wife reports going tomorrow for [facility].   Patient is accompained by: Family member   Pertinent History Leukemia (AML) dx Nov. 2016 (in remission); LUE intention tremor > 5 years; polymyalgia, DM, CAD, dysarthria   Limitations Walking   Patient Stated Goals Pt's goal for therapy is to improve balance and to walk again.   Currently in Pain? No/denies  Pain Onset More than a month ago                         [facility] Adult PT Treatment/Exercise - 09/01/16 0001      Transfers   Transfers Sit to Stand;Stand to Sit   Sit to Stand 4: Min guard   Stand to Sit 5: Supervision;4: Min guard   Number of Reps --  At least 5 reps through session     Ambulation/Gait   Ambulation/Gait Yes   Ambulation/Gait Assistance 5: Supervision   Ambulation/Gait Assistance Details Cues for upright posture   Ambulation Distance (Feet) 400 Feet  then 200 ft, then 80 ft   Assistive device Rolling walker   Gait Pattern Step-through pattern;Decreased step length - right;Decreased step length - left;Decreased weight shift to right;Poor foot clearance - left;Poor foot clearance - right   Ambulation Surface Level;Indoor   Gait Comments At  end of session with last bout of gait, pt needs more cues for upright posture, and for increased step length due to increased festinating/tremor of RLE             Balance Exercises - 09/01/16 0850      Balance Exercises: Standing   SLS Eyes open;Solid surface;Upper extremity support 2  x 20 reps cue for upright posture/lessening UE support   Stepping Strategy Anterior;Lateral;10 reps;UE support  Alternating legs, then 2nd set with step over obstacles   Retro Gait Upper extremity support;3 reps  Forward/back, length of counter, 1 UE support   Sidestepping Upper extremity support;3 reps  length of counter   Marching Limitations marching in place x 20 reps, then marching forward/back 3 reps length of counter with 1 UE support     Pt requires several seated rest breaks between gait and balance activities.          PT Short Term Goals - 07/07/16 1232      PT SHORT TERM GOAL #1   Title Pt will perform HEP with family supervision for improved posture, strength, gait and balance.  TARGET 07/07/16   Time 4   Period Weeks   Status Achieved     PT SHORT TERM GOAL #2   Title Berg Balance score to be assessed, with goal to be written as appropriate. 2/28 score 32/56; Goal to increase to 37/56 to demonstrate improved balance/safety.    Baseline 38/56 on 07/07/16   Time 4   Period Weeks   Status Achieved     PT SHORT TERM GOAL #3   Title Pt will improve TUG score to less than or equal to 30 seconds for decreased fall risk.   Baseline 43.72 sec on 07/05/16   Time 4   Period Weeks   Status Not Met     PT SHORT TERM GOAL #4   Title Pt will perform at least 8 of 10 reps of sit<>stand transfers with minimal UE support, modified independently for improved safety and efficiency with transfers.   Time 4   Period Weeks   Status Achieved     PT SHORT TERM GOAL #5   Title Pt will ambulate at least 200 ft, using RW, with min assistance, for improved safety and efficiency with gait.    Time 4   Period Weeks   Status Achieved     PT SHORT TERM GOAL #6   Title Pt will negotiate one step no handrail, with min assistance, for safe negotiation of step into and out  of home.   Time 4   Period Weeks   Status Achieved           PT Long Term Goals - 08/23/16 1033      PT LONG TERM GOAL #1   Title Pt and wife will verbalize understanding of fall prevention in the home environment.  TARGT 08/06/16   Time 8   Period Weeks   Status Achieved     PT LONG TERM GOAL #2   Title Pt will improve TUG score to less than or equal to 20 seconds for decreased fall risk.     Baseline TUG 46.33 sec at best 08/04/16   Time 8   Period Weeks   Status Not Met     PT LONG TERM GOAL #3   Title Pt will improve gait velocity to at least 2 ft/sec for decreased fall risk and improved gait efficiency and safety.   Baseline 1.58 ft/sec on 08/05/16   Time 8   Period Weeks   Status Not Met     PT LONG TERM GOAL #4   Title Pt will improve Berg Balance score by at least 10 points (from eval) for decreased fall risk.   Baseline eval Berg 32/56; Berg on 08/04/16 44/56   Time 8   Period Weeks   Status Achieved     PT LONG TERM GOAL #5   Title Pt will ambulate at least 500 ft using RW, with supervision, for improved gait efficiency and safety.   Time 8   Period Weeks   Status Achieved     PT LONG TERM GOAL #6   Title Pt will improve TUG score to less than or equal to 35 seconds for decreased fall risk. TARGET 09/04/16   Time 4  per recert 0/93/81   Period Weeks   Status On-going     PT LONG TERM GOAL #7   Title Pt will improve gait velocity to at least 1.8 ft/sec for decreased fall risk.  TARGET 09/04/16   Time 4  Per recert 12/15/91   Period Weeks   Status Achieved     PT LONG TERM GOAL #8   Title Pt will improve Berg Balance score to at least 48/56 for decreased fall risk.  TARGET 09/04/16   Time 4  Per recert 11/01/94   Period Weeks   Status On-going     PT LONG TERM GOAL  #9    TITLE Pt/wife will verbalize community fitness options upon D/C from PT.  TARGET 09/04/16   Time 4  per recert 7/89/38   Period Weeks   Status On-going               Plan - 09/01/16 1137    Clinical Impression Statement Pt requests to work more on balance today, so PT addresses posture and balance with standing exercises at counter.  Pt appears to have increased tolerance for activity today, with ability to perform some of the balance activities in one UE support.  Pt and wife desire to continue through POC's end next week, with transition then to ACT.  Discussed importance of continuing to perform HEP consistently at home, then have Alfonse Spruce at [place] his own program for Mr. Kluth (versus doing therapy exercises with trainer at the gym-per wife's questions).  Will plan for discharge next week.   Rehab Potential Good   PT Frequency 2x / week   PT Duration 4 weeks  per recert 04/18/73   PT Treatment/Interventions  ADLs/Self Care Home Management;Functional mobility training;Gait training;Therapeutic activities;Therapeutic exercise;Balance training;Neuromuscular re-education;Patient/family education   PT Next Visit Plan Check full HEP and make any additons as needed; check LTG and plan for d/c next week.   PT Home Exercise Plan PWR! Moves in sitting, repeated sit<>stand transfers, ant-post and lateral wt-shifts   Consulted and Agree with Plan of Care Patient;Family member/caregiver   Family Member Consulted wife      Patient will benefit from skilled therapeutic intervention in order to improve the following deficits and impairments:  Abnormal gait, Decreased activity tolerance, Decreased balance, Decreased mobility, Decreased strength, Difficulty walking, Impaired tone, Postural dysfunction  Visit Diagnosis: Other abnormalities of gait and mobility  Unsteadiness on feet  Abnormal posture     Problem List Patient Active Problem List   Diagnosis Date Noted  . AML (acute  myelogenous leukemia) ([place]) 02/20/2015  . Leukocytosis 02/19/2015  . Hepatosplenomegaly 02/19/2015  . Lymphedema: RLE 02/19/2015  . PMR (polymyalgia rheumatica) ([facility]) 02/19/2015  . Cerebral thrombosis with cerebral infarction ([place]) 10/29/2014  . GERD (gastroesophageal reflux disease) 10/28/2014  . Diabetes mellitus without complication ([place]) 76/72/0947  . History of GI bleed 10/28/2014  . Vertigo 10/28/2014  . Diarrhea 10/28/2014  . Essential hypertension   . Stroke with cerebral ischemia ([place])   . Acute GI bleeding 04/16/2011  . Skin cancer 04/15/2011  . Thrombocytopenia ([place]) 03/28/2011  . Lymphadenopathy, neck 03/08/2011  . CAD (coronary artery disease) 01/13/2011  . Hypothyroidism 03/29/2010  . SPLENOMEGALY 03/29/2010  . HEPATOMEGALY, HX OF 03/29/2010  . FEVER, HX OF 03/29/2010  . HLD (hyperlipidemia) 09/07/2007  . HYPERTENSION 09/07/2007  . COUGH 09/07/2007    Caeleb Batalla W. 09/01/2016, 11:43 AM Frazier Butt., PT  [facility] [address] [place], [place], [address] Phone: [phone]   Fax:  [phone]  Name: SILVESTRE MINES MRN: [id number] Date of Birth: [birth year]

## 2016-09-02 DIAGNOSIS — R4701 Aphasia: Secondary | ICD-10-CM | POA: Diagnosis not present

## 2016-09-02 DIAGNOSIS — G4700 Insomnia, unspecified: Secondary | ICD-10-CM | POA: Diagnosis not present

## 2016-09-02 DIAGNOSIS — E119 Type 2 diabetes mellitus without complications: Secondary | ICD-10-CM | POA: Diagnosis not present

## 2016-09-02 DIAGNOSIS — D696 Thrombocytopenia, unspecified: Secondary | ICD-10-CM | POA: Diagnosis not present

## 2016-09-02 DIAGNOSIS — G20 Parkinson's disease: Secondary | ICD-10-CM | POA: Diagnosis not present

## 2016-09-02 DIAGNOSIS — C9200 Acute myeloblastic leukemia, not having achieved remission: Secondary | ICD-10-CM | POA: Diagnosis not present

## 2016-09-02 DIAGNOSIS — C9201 Acute myeloblastic leukemia, in remission: Secondary | ICD-10-CM | POA: Diagnosis not present

## 2016-09-02 DIAGNOSIS — R251 Tremor, unspecified: Secondary | ICD-10-CM | POA: Diagnosis not present

## 2016-09-02 DIAGNOSIS — E039 Hypothyroidism, unspecified: Secondary | ICD-10-CM | POA: Diagnosis not present

## 2016-09-02 DIAGNOSIS — H578 Other specified disorders of eye and adnexa: Secondary | ICD-10-CM | POA: Diagnosis not present

## 2016-09-05 ENCOUNTER — Ambulatory Visit: Payer: Medicare Other | Admitting: Physical Therapy

## 2016-09-05 ENCOUNTER — Encounter: Payer: Self-pay | Admitting: Physical Therapy

## 2016-09-05 DIAGNOSIS — M6281 Muscle weakness (generalized): Secondary | ICD-10-CM | POA: Diagnosis not present

## 2016-09-05 DIAGNOSIS — R2689 Other abnormalities of gait and mobility: Secondary | ICD-10-CM | POA: Diagnosis not present

## 2016-09-05 DIAGNOSIS — R2681 Unsteadiness on feet: Secondary | ICD-10-CM | POA: Diagnosis not present

## 2016-09-05 DIAGNOSIS — R293 Abnormal posture: Secondary | ICD-10-CM | POA: Diagnosis not present

## 2016-09-05 NOTE — Therapy (Signed)
[facility] [address] [place], [place], [address] Phone: [phone]   Fax:  [phone]  Physical Therapy Treatment  Patient Details  Name: Nathan Kemp MRN: [id number] Date of Birth: 12/18/1940 Referring Provider: Tate, Jessica Amelia  Encounter Date: 09/05/2016      PT End of Session - 09/05/16 1230    Visit Number 24   Number of Visits 25   Date for PT Re-Evaluation 10/04/16  Per recert 08/05/16   Authorization Type Medicare Primary; BCBS secondary-GCODE every 10th visit   PT Start Time 0800   PT Stop Time 0845   PT Time Calculation (min) 45 min   Equipment Utilized During Treatment Gait belt   Activity Tolerance Patient limited by fatigue  seated rests more frequently   Behavior During Therapy WFL for tasks assessed/performed      Past Medical History:  Diagnosis Date  . Acute GI bleeding 04/16/2011  . Anemia   . Anorexia   . Asthma    when  have congstion  of chest  . Blood dyscrasia 06/2009   thrombocytopenia  . Blood transfusion    platelets 12/12  . 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 Coronary artery disease    stent 06  . Cough   . DDD (degenerative disc disease), cervical   . Diabetes mellitus    borderline-controlled by diet  . ED (erectile dysfunction)   . Essential hypertension   . Fatigue   . GERD (gastroesophageal reflux disease)   . Heart murmur   . Hyperlipidemia   . Hypertension   . Hypothyroidism   . IBS (irritable bowel syndrome)   . Leg swelling    due to lymphedema  . Lymphedema: RLE 02/19/2015  . Obesity   . PMR (polymyalgia rheumatica) (HCC) 02/19/2015  . Pneumonia   . Polymyalgia (HCC)   . Thrombocytopenia (HCC)   . Thyroid disease   . Trouble swallowing   . Vitamin D deficiency     Past Surgical History:  Procedure Laterality Date  . APPENDECTOMY  1967  . BONE MARROW BIOPSY     2011-at [facility], 01/2010-at [facility], 06/12-[facility]  .  CARDIAC CATHETERIZATION  01/31/2005   EF 60-65%  . cataract Bilateral   . CORONARY ANGIOPLASTY WITH STENT PLACEMENT  2006   STENTING OF HIS CORONARY ARTERY  . EDG Dilitation  2007,12  . 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 LYMPH NODE BIOPSY  03/15/2011   Procedure: LYMPH NODE BIOPSY;  Surgeon: Todd M Gerkin, MD;  Location: [facility];  Service: General;  Laterality: Right;  Right Anterior Cervical Lymph Node Excisional Biopsy   . lymph node removal  2011   right groin  . NASAL SINUS SURGERY  2005  . TONSILLECTOMY AND ADENOIDECTOMY    . US ECHOCARDIOGRAPHY  02/28/2005   EF 55-60%    There were no vitals filed for this visit.      Subjective Assessment - 09/05/16 0802    Subjective Platelet count has dropped and hes's been more shakey. They think his leukemia may be coming back. Will get blood work results later today (from [facility]).    Patient is accompained by: Family member   Pertinent History Leukemia (AML) dx Nov. 2016 (in remission); LUE intention tremor > 5 years; polymyalgia, DM, CAD, dysarthria   Limitations Walking   Patient Stated Goals Pt's goal for   therapy is to improve balance and to walk again.   Pain Score 5    Pain Location --  all over arthritis and polymyalgia   Pain Descriptors / Indicators Aching   Pain Onset More than a month ago   Pain Frequency Constant                         [facility] Adult PT Treatment/Exercise - 09/05/16 0825      Transfers   Transfers Sit to Stand;Stand to Sit   Sit to Stand 5: Supervision   Sit to Stand Details (indicate cue type and reason) --   Stand to Sit With upper extremity assist;5: Supervision     Ambulation/Gait   Ambulation/Gait Yes   Ambulation/Gait Assistance 5: Supervision   Ambulation/Gait Assistance Details pt educated to walk a comfortable pace and distance; steady with left and right turns   Ambulation Distance (Feet) 590 Feet   Assistive device  Rolling walker   Gait Pattern Step-through pattern;Decreased step length - right;Decreased step length - left;Poor foot clearance - left;Poor foot clearance - right   Ambulation Surface Level;Indoor   Gait velocity 32.8 ft/23.81 sec= 1.38 ft/sec   Gait Comments gait velocity measured after gait distance and TUG     Timed Up and Go Test   TUG Normal TUG   Normal TUG (seconds) 39.97                PT Education - 09/05/16 1227    Education provided Yes   Education Details Reviewed (via handouts and PT demonstration due to pt fatigue) his entire HEP and pt reports he continues to do all the exercises except standing hip abduction (due to fear of falling) and no standing PWR exercises (for fear of falling). He typically divides them up throughout the day and most days cannot do them all due to fatigue. If it's a day when he feels worse, there are exercises that are more taxing that he may not do.    Person(s) Educated Patient;Spouse   Methods Explanation;Demonstration;Handout   Comprehension Verbalized understanding          PT Short Term Goals - 07/07/16 1232      PT SHORT TERM GOAL #1   Title Pt will perform HEP with family supervision for improved posture, strength, gait and balance.  TARGET 07/07/16   Time 4   Period Weeks   Status Achieved     PT SHORT TERM GOAL #2   Title Berg Balance score to be assessed, with goal to be written as appropriate. 2/28 score 32/56; Goal to increase to 37/56 to demonstrate improved balance/safety.    Baseline 38/56 on 07/07/16   Time 4   Period Weeks   Status Achieved     PT SHORT TERM GOAL #3   Title Pt will improve TUG score to less than or equal to 30 seconds for decreased fall risk.   Baseline 43.72 sec on 07/05/16   Time 4   Period Weeks   Status Not Met     PT SHORT TERM GOAL #4   Title Pt will perform at least 8 of 10 reps of sit<>stand transfers with minimal UE support, modified independently for improved safety and efficiency  with transfers.   Time 4   Period Weeks   Status Achieved     PT SHORT TERM GOAL #5   Title Pt will ambulate at least 200 ft, using RW, with min assistance, for  improved safety and efficiency with gait.   Time 4   Period Weeks   Status Achieved     PT SHORT TERM GOAL #6   Title Pt will negotiate one step no handrail, with min assistance, for safe negotiation of step into and out of home.   Time 4   Period Weeks   Status Achieved           PT Long Term Goals - 09/05/16 1231      PT LONG TERM GOAL #1   Title Pt and wife will verbalize understanding of fall prevention in the home environment.  TARGT 08/06/16   Time 8   Period Weeks   Status Achieved     PT LONG TERM GOAL #2   Title Pt will improve TUG score to less than or equal to 20 seconds for decreased fall risk.     Baseline TUG 46.33 sec at best 08/04/16   Time 8   Period Weeks   Status Not Met     PT LONG TERM GOAL #3   Title Pt will improve gait velocity to at least 2 ft/sec for decreased fall risk and improved gait efficiency and safety.   Baseline 1.58 ft/sec on 08/05/16   Time 8   Period Weeks   Status Not Met     PT LONG TERM GOAL #4   Title Pt will improve Berg Balance score by at least 10 points (from eval) for decreased fall risk.   Baseline eval Berg 32/56; Berg on 08/04/16 44/56   Time 8   Period Weeks   Status Achieved     PT LONG TERM GOAL #5   Title Pt will ambulate at least 500 ft using RW, with supervision, for improved gait efficiency and safety.   Time 8   Period Weeks   Status Achieved     PT LONG TERM GOAL #6   Title Pt will improve TUG score to less than or equal to 35 seconds for decreased fall risk. TARGET 09/04/16   Baseline 5/21 39.97   Time 4  per recert 08/05/16   Period Weeks   Status Not Met     PT LONG TERM GOAL #7   Title Pt will improve gait velocity to at least 1.8 ft/sec for decreased fall risk.  TARGET 09/04/16   Baseline 5/21 1.38 ft/sec   Time 4  Per recert 08/05/16    Period Weeks   Status Not Met     PT LONG TERM GOAL #8   Title Pt will improve Berg Balance score to at least 48/56 for decreased fall risk.  TARGET 09/04/16   Time 4  Per recert 08/05/16   Period Weeks   Status On-going     PT LONG TERM GOAL  #9   TITLE Pt/wife will verbalize community fitness options upon D/C from PT.  TARGET 09/04/16   Baseline 5/21 MET   Time 4  per recert 08/05/16   Period Weeks   Status On-going               Plan - 09/05/16 1231    Clinical Impression Statement Began to check patient's LTGs as this is his next to last visit. Although he improved his gait velocity and TUG times from last assessment, he did not reach the anticipated goal levels. He did meet (and surpass) his walking goal. Reviewed HEP for questions/clarifications and to determine if anything needed to be updated. Will plan to do Berg on next   visit and discharge.    Rehab Potential Good   PT Frequency 2x / week   PT Duration 4 weeks  per recert 8/54/62   PT Treatment/Interventions ADLs/Self Care Home Management;Functional mobility training;Gait training;Therapeutic activities;Therapeutic exercise;Balance training;Neuromuscular re-education;Patient/family education   PT Next Visit Plan check Berg LTG (all others checked 5/21) He did walk 590 ft before I did the TUG or gait velocity...and he met neither goal if you want to repeat when less fatigued :)   PT Home Exercise Plan --   Consulted and Agree with Plan of Care Patient;Family member/caregiver   Family Member Consulted wife      Patient will benefit from skilled therapeutic intervention in order to improve the following deficits and impairments:  Abnormal gait, Decreased activity tolerance, Decreased balance, Decreased mobility, Decreased strength, Difficulty walking, Impaired tone, Postural dysfunction  Visit Diagnosis: Other abnormalities of gait and mobility  Unsteadiness on feet  Muscle weakness (generalized)     Problem  List Patient Active Problem List   Diagnosis Date Noted  . AML (acute myelogenous leukemia) ([place]) 02/20/2015  . Leukocytosis 02/19/2015  . Hepatosplenomegaly 02/19/2015  . Lymphedema: RLE 02/19/2015  . PMR (polymyalgia rheumatica) (HCC) 02/19/2015  . Cerebral thrombosis with cerebral infarction ([place]) 10/29/2014  . GERD (gastroesophageal reflux disease) 10/28/2014  . Diabetes mellitus without complication ([place]) 70/35/0093  . History of GI bleed 10/28/2014  . Vertigo 10/28/2014  . Diarrhea 10/28/2014  . Essential hypertension   . Stroke with cerebral ischemia ([place])   . Acute GI bleeding 04/16/2011  . Skin cancer 04/15/2011  . Thrombocytopenia ([place]) 03/28/2011  . Lymphadenopathy, neck 03/08/2011  . CAD (coronary artery disease) 01/13/2011  . Hypothyroidism 03/29/2010  . SPLENOMEGALY 03/29/2010  . HEPATOMEGALY, HX OF 03/29/2010  . FEVER, HX OF 03/29/2010  . HLD (hyperlipidemia) 09/07/2007  . HYPERTENSION 09/07/2007  . COUGH 09/07/2007    Rexanne Mano, PT 09/05/2016, 12:42 PM  [facility] [address] [place], [place], [address] Phone: [phone]   Fax:  [phone]  Name: Nathan Kemp MRN: [id number] Date of Birth: 1940/12/03

## 2016-09-08 ENCOUNTER — Ambulatory Visit: Payer: Medicare Other | Admitting: Physical Therapy

## 2016-09-08 DIAGNOSIS — R2681 Unsteadiness on feet: Secondary | ICD-10-CM

## 2016-09-08 DIAGNOSIS — M6281 Muscle weakness (generalized): Secondary | ICD-10-CM | POA: Diagnosis not present

## 2016-09-08 DIAGNOSIS — R2689 Other abnormalities of gait and mobility: Secondary | ICD-10-CM

## 2016-09-08 DIAGNOSIS — R293 Abnormal posture: Secondary | ICD-10-CM | POA: Diagnosis not present

## 2016-09-08 NOTE — Therapy (Signed)
[facility] [address] [place], [place], [address] Phone: [phone]   Fax:  [phone]  Physical Therapy Treatment  Patient Details  Name: Nathan Kemp MRN: [id number] Date of Birth: Mar 03, 1941 Referring Provider: Sonia Baller  Encounter Date: 09/08/2016      PT End of Session - 09/08/16 1157    Visit Number 25   Number of Visits 25   Date for PT Re-Evaluation 35/57/32  Per recert 05/20/52   Authorization Type Medicare Primary; BCBS secondary-GCODE every 10th visit   PT Start Time 0805   PT Stop Time 0845   PT Time Calculation (min) 40 min   Equipment Utilized During Treatment Gait belt   Activity Tolerance Patient tolerated treatment well   Behavior During Therapy [facility] for tasks assessed/performed      Past Medical History:  Diagnosis Date  . Acute GI bleeding 04/16/2011  . Anemia   . Anorexia   . Asthma    when  have congstion  of chest  . Blood dyscrasia 06/2009   thrombocytopenia  . Blood transfusion    platelets 12/12  . 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 Coronary artery disease    stent 06  . Cough   . DDD (degenerative disc disease), cervical   . Diabetes mellitus    borderline-controlled by diet  . ED (erectile dysfunction)   . Essential hypertension   . Fatigue   . GERD (gastroesophageal reflux disease)   . Heart murmur   . Hyperlipidemia   . Hypertension   . Hypothyroidism   . IBS (irritable bowel syndrome)   . Leg swelling    due to lymphedema  . Lymphedema: RLE 02/19/2015  . Obesity   . PMR (polymyalgia rheumatica) ([place]) 02/19/2015  . Pneumonia   . Polymyalgia ([place])   . Thrombocytopenia ([place])   . Thyroid disease   . Trouble swallowing   . Vitamin D deficiency     Past Surgical History:  Procedure Laterality Date  . APPENDECTOMY  1967  . BONE MARROW BIOPSY     2011-at [facility], 01/2010-at [facility], 06/12-[facility]  . CARDIAC CATHETERIZATION   01/31/2005   EF 60-65%  . cataract Bilateral   . CORONARY ANGIOPLASTY WITH STENT PLACEMENT  2006   STENTING OF HIS CORONARY ARTERY  . EDG Dilitation  [id number]  . 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 LYMPH NODE BIOPSY  03/15/2011   Procedure: LYMPH NODE BIOPSY;  Surgeon: Earnstine Regal, MD;  Location: [facility];  Service: General;  Laterality: Right;  Right Anterior Cervical Lymph Node Excisional Biopsy   . lymph node removal  2011   right groin  . NASAL SINUS SURGERY  2005  . TONSILLECTOMY AND ADENOIDECTOMY    . US ECHOCARDIOGRAPHY  02/28/2005   EF 55-60%    There were no vitals filed for this visit.      Subjective Assessment - 09/08/16 0808    Subjective Didn't sleep well last night.  Will go to [facility] tomorrow to get the results from lab tests.   Patient is accompained by: Family member   Pertinent History Leukemia (AML) dx Nov. 2016 (in remission); LUE intention tremor > 5 years; polymyalgia, DM, CAD, dysarthria   Limitations Walking   Patient Stated Goals Pt's goal for therapy is to improve balance and to walk again.   Currently in Pain?  No/denies   Pain Onset More than a month ago                         [facility] Adult PT Treatment/Exercise - 09/08/16 0817      Ambulation/Gait   Ambulation/Gait Yes   Ambulation/Gait Assistance 5: Supervision   Ambulation Distance (Feet) 120 Feet  x 2, 100 ft, including turns, furniture negotiation   Assistive device Rolling walker   Gait Pattern Step-through pattern;Decreased step length - right;Decreased step length - left;Poor foot clearance - left;Poor foot clearance - right   Ambulation Surface Level;Indoor   Gait velocity 17.88 sec = 1.83 ft/sec   Gait Comments gait velocity measured at beginning of session today     Standardized Balance Assessment   Standardized Balance Assessment Berg Balance Test;Timed Up and Go Test     Berg Balance Test   Sit to Stand Able  to stand without using hands and stabilize independently   Standing Unsupported Able to stand safely 2 minutes   Sitting with Back Unsupported but Feet Supported on Floor or Stool Able to sit safely and securely 2 minutes   Stand to Sit Sits safely with minimal use of hands   Transfers Able to transfer safely, minor use of hands   Standing Unsupported with Eyes Closed Able to stand 10 seconds safely   Standing Ubsupported with Feet Together Able to place feet together independently and stand 1 minute safely   From Standing, Reach Forward with Outstretched Arm Can reach confidently >25 cm (10")   From Standing Position, Pick up Object from Floor Able to pick up shoe, needs supervision   From Standing Position, Turn to Look Behind Over each Shoulder Looks behind from both sides and weight shifts well   Turn 360 Degrees Able to turn 360 degrees safely but slowly   Standing Unsupported, Alternately Place Feet on Step/Stool Able to complete >2 steps/needs minimal assist   Standing Unsupported, One Foot in Front Able to plae foot ahead of the other independently and hold 30 seconds   Standing on One Leg Tries to lift leg/unable to hold 3 seconds but remains standing independently   Total Score 46     Timed Up and Go Test   TUG Normal TUG   Normal TUG (seconds) 34.04     High Level Balance   High Level Balance Activities Side stepping  3 reps length of counter   High Level Balance Comments Side taps x 10 reps, then back taps x 10 reps (as option for leg kicks, which pt reported last visit that he has difficulty with due to fear of falling.                  PT Short Term Goals - 07/07/16 1232      PT SHORT TERM GOAL #1   Title Pt will perform HEP with family supervision for improved posture, strength, gait and balance.  TARGET 07/07/16   Time 4   Period Weeks   Status Achieved     PT SHORT TERM GOAL #2   Title Berg Balance score to be assessed, with goal to be written as  appropriate. 2/28 score 32/56; Goal to increase to 37/56 to demonstrate improved balance/safety.    Baseline 38/56 on 07/07/16   Time 4   Period Weeks   Status Achieved     PT SHORT TERM GOAL #3   Title Pt will improve TUG score to less than  or equal to 30 seconds for decreased fall risk.   Baseline 43.72 sec on 07/05/16   Time 4   Period Weeks   Status Not Met     PT SHORT TERM GOAL #4   Title Pt will perform at least 8 of 10 reps of sit<>stand transfers with minimal UE support, modified independently for improved safety and efficiency with transfers.   Time 4   Period Weeks   Status Achieved     PT SHORT TERM GOAL #5   Title Pt will ambulate at least 200 ft, using RW, with min assistance, for improved safety and efficiency with gait.   Time 4   Period Weeks   Status Achieved     PT SHORT TERM GOAL #6   Title Pt will negotiate one step no handrail, with min assistance, for safe negotiation of step into and out of home.   Time 4   Period Weeks   Status Achieved           PT Long Term Goals - 09/08/16 0809      PT LONG TERM GOAL #1   Title Pt and wife will verbalize understanding of fall prevention in the home environment.  TARGT 08/06/16   Time 8   Period Weeks   Status Achieved     PT LONG TERM GOAL #2   Title Pt will improve TUG score to less than or equal to 20 seconds for decreased fall risk.     Baseline TUG 46.33 sec at best 08/04/16   Time 8   Period Weeks   Status Not Met     PT LONG TERM GOAL #3   Title Pt will improve gait velocity to at least 2 ft/sec for decreased fall risk and improved gait efficiency and safety.   Baseline 1.58 ft/sec on 08/05/16   Time 8   Period Weeks   Status Not Met     PT LONG TERM GOAL #4   Title Pt will improve Berg Balance score by at least 10 points (from eval) for decreased fall risk.   Baseline eval Berg 32/56; Berg on 08/04/16 44/56   Time 8   Period Weeks   Status Achieved     PT LONG TERM GOAL #5   Title Pt will  ambulate at least 500 ft using RW, with supervision, for improved gait efficiency and safety.   Time 8   Period Weeks   Status Achieved     PT LONG TERM GOAL #6   Title Pt will improve TUG score to less than or equal to 35 seconds for decreased fall risk. TARGET 09/04/16   Baseline 5/21 39.97, 09/08/16 34.04 sec   Time 4  per recert 5/40/08   Period Weeks   Status Partially Met     PT LONG TERM GOAL #7   Title Pt will improve gait velocity to at least 1.8 ft/sec for decreased fall risk.  TARGET 09/04/16   Baseline 5/21 1.38 ft/sec, 09/08/16 1.83 ft/sec   Time 4  Per recert 6/76/19   Period Weeks   Status Partially Met     PT LONG TERM GOAL #8   Title Pt will improve Berg Balance score to at least 48/56 for decreased fall risk.  TARGET 09/04/16   Time 4  Per recert 08/25/30   Period Weeks   Status Not Met     PT LONG TERM GOAL  #9   TITLE Pt/wife will verbalize community fitness options upon D/C from  PT.  TARGET 09/04/16   Baseline 5/21 MET   Time 4  per recert 4/66/59   Period Weeks   Status Achieved               Plan - 09-23-16 1157    Clinical Impression Statement Checked remaining LTGs this visit.  Pt has met LTG 9 and partially met LTG 6 and 7 (due to fluctuations in TUG and gait velocity scores based on fatigue).  LTG 8 not met, with Berg score improved from 44 to 46/56.  Pt feels he is prepared to continue HEp at home and begin [employer] fitness next week; however, they are planning to follow up with physician at [facility] tomorrow for lab results for ongoing medical issues.   Rehab Potential Good   PT Frequency 2x / week   PT Duration 4 weeks  per recert 9/35/70   PT Treatment/Interventions ADLs/Self Care Home Management;Functional mobility training;Gait training;Therapeutic activities;Therapeutic exercise;Balance training;Neuromuscular re-education;Patient/family education   PT Next Visit Plan Discharge this visit   Consulted and Agree with Plan of Care Patient;Family  member/caregiver   Family Member Consulted wife      Patient will benefit from skilled therapeutic intervention in order to improve the following deficits and impairments:  Abnormal gait, Decreased activity tolerance, Decreased balance, Decreased mobility, Decreased strength, Difficulty walking, Impaired tone, Postural dysfunction  Visit Diagnosis: Other abnormalities of gait and mobility  Unsteadiness on feet  Muscle weakness (generalized)       G-Codes - September 23, 2016 1200    Functional Assessment Tool Used (Outpatient Only) TUG 34.04 sec, Berg 46/56, gait velocity 1.83 ft/sec; motor fluctuations with fatigue; no reported falls; amb 590 ft with RW   Functional Limitation Mobility: Walking and moving around   Mobility: Walking and Moving Around Goal Status [phone]) At least 20 percent but less than 40 percent impaired, limited or restricted   Mobility: Walking and Moving Around Discharge Status [phone]) At least 20 percent but less than 40 percent impaired, limited or restricted      Problem List Patient Active Problem List   Diagnosis Date Noted  . AML (acute myelogenous leukemia) ([place]) 02/20/2015  . Leukocytosis 02/19/2015  . Hepatosplenomegaly 02/19/2015  . Lymphedema: RLE 02/19/2015  . PMR (polymyalgia rheumatica) (HCC) 02/19/2015  . Cerebral thrombosis with cerebral infarction ([place]) 10/29/2014  . GERD (gastroesophageal reflux disease) 10/28/2014  . Diabetes mellitus without complication ([place]) 92/33/0076  . History of GI bleed 10/28/2014  . Vertigo 10/28/2014  . Diarrhea 10/28/2014  . Essential hypertension   . Stroke with cerebral ischemia ([place])   . Acute GI bleeding 04/16/2011  . Skin cancer 04/15/2011  . Thrombocytopenia ([place]) 03/28/2011  . Lymphadenopathy, neck 03/08/2011  . CAD (coronary artery disease) 01/13/2011  . Hypothyroidism 03/29/2010  . SPLENOMEGALY 03/29/2010  . HEPATOMEGALY, HX OF 03/29/2010  . FEVER, HX OF 03/29/2010  . HLD (hyperlipidemia) 09/07/2007   . HYPERTENSION 09/07/2007  . COUGH 09/07/2007    Jersi Mcmaster W. 2016-09-23, 12:01 PM Frazier Butt., PT [facility] [address] [place], [place], [address] Phone: [phone]   Fax:  [phone]  Name: Nathan Kemp MRN: [id number]   PHYSICAL THERAPY DISCHARGE SUMMARY  Visits from Start of Care: 25  Current functional level related to goals / functional outcomes: See LTGs checked above   Remaining deficits: Fatigue, decreased balance, decreased strength   Education / Equipment: Educated in [employer], fall prevention, plans for continued community fitness.  Plan: Patient agrees to discharge.  Patient  goals were partially met. Patient is being discharged due to being pleased with the current functional level.  ?????Pt appears to have maximized rehab potential at this time.     Date of Birth: [birth year]    Mady Haagensen, PT 09/08/16 12:03 PM Phone: [phone] Fax: [phone]

## 2016-09-09 ENCOUNTER — Telehealth: Payer: Self-pay | Admitting: Neurology

## 2016-09-09 DIAGNOSIS — C9201 Acute myeloblastic leukemia, in remission: Secondary | ICD-10-CM | POA: Diagnosis not present

## 2016-09-09 NOTE — Telephone Encounter (Signed)
Caller: PT's wife  Urgent?   Reason for the call: She left a message saying that she should have gotten a call regarding the Dat Scan

## 2016-09-09 NOTE — Telephone Encounter (Signed)
Spoke with patient's wife and made her aware we are working on these still. They should be calling him soon.

## 2016-09-15 ENCOUNTER — Telehealth: Payer: Self-pay | Admitting: Radiology

## 2016-09-15 ENCOUNTER — Telehealth: Payer: Self-pay | Admitting: Neurology

## 2016-09-15 NOTE — Telephone Encounter (Signed)
I've already spoke with patient's wife to let her know they are working on this and they will call her. I have no control over the DAT schedule.

## 2016-09-15 NOTE — Telephone Encounter (Signed)
Patient wife left message on voice mail about a Dat Scan she still has not heard anything and they need to get it on the book with it being summer. She would like a call back

## 2016-09-30 DIAGNOSIS — J45909 Unspecified asthma, uncomplicated: Secondary | ICD-10-CM | POA: Diagnosis not present

## 2016-09-30 DIAGNOSIS — E119 Type 2 diabetes mellitus without complications: Secondary | ICD-10-CM | POA: Diagnosis not present

## 2016-09-30 DIAGNOSIS — Z881 Allergy status to other antibiotic agents status: Secondary | ICD-10-CM | POA: Diagnosis not present

## 2016-09-30 DIAGNOSIS — Z888 Allergy status to other drugs, medicaments and biological substances status: Secondary | ICD-10-CM | POA: Diagnosis not present

## 2016-09-30 DIAGNOSIS — Z885 Allergy status to narcotic agent status: Secondary | ICD-10-CM | POA: Diagnosis not present

## 2016-09-30 DIAGNOSIS — C9200 Acute myeloblastic leukemia, not having achieved remission: Secondary | ICD-10-CM | POA: Diagnosis not present

## 2016-09-30 DIAGNOSIS — E039 Hypothyroidism, unspecified: Secondary | ICD-10-CM | POA: Diagnosis not present

## 2016-09-30 DIAGNOSIS — I10 Essential (primary) hypertension: Secondary | ICD-10-CM | POA: Diagnosis not present

## 2016-09-30 DIAGNOSIS — D696 Thrombocytopenia, unspecified: Secondary | ICD-10-CM | POA: Diagnosis not present

## 2016-09-30 DIAGNOSIS — Z79899 Other long term (current) drug therapy: Secondary | ICD-10-CM | POA: Diagnosis not present

## 2016-09-30 DIAGNOSIS — I2510 Atherosclerotic heart disease of native coronary artery without angina pectoris: Secondary | ICD-10-CM | POA: Diagnosis not present

## 2016-10-06 ENCOUNTER — Encounter (HOSPITAL_COMMUNITY)
Admission: RE | Admit: 2016-10-06 | Discharge: 2016-10-06 | Disposition: A | Discharge status: Home / Self Care | Payer: Medicare Other | Source: Ambulatory Visit | Attending: Neurology | Admitting: Neurology

## 2016-10-06 ENCOUNTER — Telehealth: Payer: Self-pay | Admitting: Neurology

## 2016-10-06 ENCOUNTER — Encounter (HOSPITAL_COMMUNITY): Payer: Medicare Other

## 2016-10-06 DIAGNOSIS — R251 Tremor, unspecified: Secondary | ICD-10-CM

## 2016-10-06 DIAGNOSIS — R9089 Other abnormal findings on diagnostic imaging of central nervous system: Secondary | ICD-10-CM | POA: Diagnosis not present

## 2016-10-06 MED ORDER — IOFLUPANE I 123 185 MBQ/2.5ML IV SOLN
5.1000 | Freq: Once | INTRAVENOUS | Status: AC
  Administered 2016-10-06: 5.1 via INTRAVENOUS

## 2016-10-06 NOTE — Telephone Encounter (Signed)
Patient is going for a DATSCAN  On 10-06-16 and patient wife really wants to have patient seen next week to go over the results and other concerns that they have have. I found appt with Dr Tat on 10-20-16 and put patient in on that Day. Wife wants to know if Dr tat will get patient in sooner (next week) she really wants patient seen then. Please call patient wife

## 2016-10-06 NOTE — Telephone Encounter (Signed)
Spoke with patient's wife and made her aware no other appts available at the moment. She will call for cancellations daily.

## 2016-10-11 NOTE — Progress Notes (Addendum)
Nathan Kemp was seen today in the movement disorders clinic for neurologic consultation at the request of Burnard Bunting, MD.  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  The records that were made available to me were reviewed.  They are quite extensive.  The patient developed new onset dysarthria and speech disturbance in July, 2016 and was admitted for stroke workup, which turned out to be negative.  He was worked up for myasthenia gravis and that workup was apparently negative.  Wife states that they thought that it was a drug reaction from a drug given by a dentist. She states today, however, that the speech issues were NOT acute in onset and were slow to come on and have been progressing ever since.   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 Follow up cytology showed resolution of leukemic meningitis but symptoms continued to worsen. Multiple MRI brain and MR C/T-spine were negative for structural/mass lesions.  Speech issues were subsequently felt due to spasmodic dysphonia with intermittent myoclonus and it was responsive to Ativan.  Wife states that when he gets chemo he has tremor and jerking of the R arm/leg but the klonopin helps.  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  DaTScan apparently was attempted, but refused by insurance.  In November, 2017 the patient began to have fairly acute worsening of right leg tremor, worsening gait, worsening speech, falls and lab work revealed an elevated ammonia for which he was treated with lactulose.  He was apparently seen by multiple gastroenterologists at places such as [facility] and [facility] and no known etiology was noted.  He saw Dr. Hall Busing at [facility] for movement disorders in January, 2018.  EMG was  completed to rule out motor neuron disease.  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  Her examination was somewhat limited as the patient was being examined on levodopa and clonazepam.  Pts wife states that L arm and R leg are fairly "useless."  He has had essential tremor in the L hand for over 10 years, but the tremor in the L arm now is so bad that it has made that arm "useless."  It is not a different tremor than he had 10 years ago but it is more severe.  The tremor in the R leg can be there when he sits or stands but mostly if he stands.  If there is no weight on the leg (laying down), his leg never tremors.  Mostly the leg will tremor after he stands and then he will be unable to stand because of it.  He used to have episodes during chemo that were bad but none of those for 6 months.     Specific Symptoms:  Tremor: Yes.   Family hx of similar:  No. Voice: more dysphasia Sleep: sleeps well  Vivid Dreams:  No.  Acting out dreams:  No. Wet Pillows: No. Postural symptoms:  Yes.    Falls?  Yes.   (uses walker now and has gait belt) Bradykinesia symptoms: slow movements (doing neurorehab now and it helps) Loss of smell:  Yes.  Loss of taste:  No. Urinary Incontinence:  No. Difficulty Swallowing:  No. Handwriting, micrographia: No. Trouble with ADL's:  No.  Trouble buttoning clothing: No. Depression:  No. Memory changes:  Yes.   Hallucinations:  No.  visual distortions: No. N/V:  No. Lightheaded:  No.  Syncope: No. Diplopia:  No. Dyskinesia:  No.  Neuroimaging has  previously been performed.  It is not available for my review today.   patient did have an MRI of the brain on 04/06/2016 at [facility].  I do not have the films.  I do have the report.  It was reported only to show small vessel disease.    10/13/16  update:  Patient is seen today in follow-up.  He had a DaT scan on 10/06/2016.  I reviewed this.  It was reported to show bilateral decreased activity in the putamen and subtle decreased activity in the head of the right caudate.  Patient had neuropsych testing from [facility] on 06/29/2016.  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  He felt that semantic dementia and logopenic variant seems less likely.  He felt that CBGD and FTD with motor neuron disease was unlikely.  It stated that the dat and/or PET scan was recommended.  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  It also stated that dopaminergic medications and anticholinesterase medications are not helpful.  Clonazepam was recommended for tremor (which he is on).  Mild depression was also noted.  We did decidide last visit to try and hold his levodopa. Pt states that he did okay with that.  Wife thinks that he is more stiff since levodopa d/c but patient denies that.  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  His speech production has gotten worse.  Last speech therapy was a year ago.  Going to [facility] [employer].  Asks about support group.  PREVIOUS MEDICATIONS: Sinemet  ALLERGIES:   Allergies  Allergen Reactions  . Cefdinir Hives  . Adhesive [Tape] Other (See Comments)    blisters  . Lodine [Etodolac]     Vertigo  . Lortab [Hydrocodone-Acetaminophen] Other (See Comments)    Makes patient very  loopy. Update   Able to take now  . Niaspan [Niacin] Hives  . Clindamycin Nausea Only and Other (See Comments)    Stomach cramps  . Codeine Rash    Had rash previously but not recently-taken in cough medicine and no problems but patient can tolerate    CURRENT MEDICATIONS:  Outpatient Encounter Prescriptions as of 10/13/2016  Medication Sig  . carbidopa-levodopa (SINEMET IR) 25-100 MG tablet Take 1 tablet by mouth 4 (four) times daily.  . cholestyramine (QUESTRAN) 4 G packet Take 4 g by mouth daily.   . clonazePAM (KLONOPIN) 1 MG tablet Take 1 mg by mouth 4 (four) times daily.  Marland Kitchen dicyclomine (BENTYL) 20 MG tablet Take 20 mg by mouth 2 (two) times daily.   . diphenhydrAMINE (BENADRYL) 25 MG tablet Take 25 mg by mouth at bedtime as needed for sleep.   Marland Kitchen  Insulin Glargine (LANTUS SOLOSTAR) 100 UNIT/ML Solostar Pen Inject 6 Units into the skin at bedtime as needed.  . insulin regular (NOVOLIN R,HUMULIN R) 100 units/mL injection Inject 6 Units into the skin daily as needed for high blood sugar. For glucose greater than 175  . levothyroxine (SYNTHROID, LEVOTHROID) 100 MCG tablet Take 100 mcg by mouth daily.  . nitroGLYCERIN (NITROSTAT) 0.4 MG SL tablet PLACE 1 TABLET UNDER THE TONGUE EVERY 5 MINUTES AS NEEDED FOR CHEST PAIN  . olmesartan-hydrochlorothiazide (BENICAR HCT) 40-25 MG per tablet Take 0.5 tablets by mouth every morning.   [name] Glycol-Propyl Glycol (SYSTANE) 0.4-0.3 % GEL Apply 1 Dose to eye at bedtime.  . predniSONE (DELTASONE) 10 MG tablet Take 10 mg by mouth daily with breakfast.   . traMADol (ULTRAM) 50 MG tablet Take 1 tablet (50 mg total) by mouth every 6 (six) hours as needed. (Patient taking differently: Take 50 mg by mouth 4 (four) times daily. )  . zolpidem (AMBIEN) 10 MG tablet Take 10 mg by mouth at bedtime.    No facility-administered encounter medications on file as of 10/13/2016.     PAST MEDICAL HISTORY:   Past Medical History:  Diagnosis Date  . Acute GI  bleeding 04/16/2011  . Anemia   . Anorexia   . Asthma    when  have congstion  of chest  . Blood dyscrasia 06/2009   thrombocytopenia  . Blood transfusion    platelets 12/12  . 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 Coronary artery disease    stent 06  . Cough   . DDD (degenerative disc disease), cervical   . Diabetes mellitus    borderline-controlled by diet  . ED (erectile dysfunction)   . Essential hypertension   . Fatigue   . GERD (gastroesophageal reflux disease)   . Heart murmur   . Hyperlipidemia   . Hypertension   . Hypothyroidism   . IBS (irritable bowel syndrome)   . Leg swelling    due to lymphedema  . Lymphedema: RLE 02/19/2015  . Obesity   . PMR (polymyalgia rheumatica) ([place]) 02/19/2015  . Pneumonia   . Polymyalgia ([place])   . Thrombocytopenia ([place])   . Thyroid disease   . Trouble swallowing   . Vitamin D deficiency     PAST SURGICAL HISTORY:   Past Surgical History:  Procedure Laterality Date  . APPENDECTOMY  1967  . BONE MARROW BIOPSY     2011-at [facility], 01/2010-at [facility], [facility]/12[facility]  . CARDIAC CATHETERIZATION  01/31/2005   EF 60-65%  . cataract Bilateral   . CORONARY ANGIOPLASTY WITH STENT PLACEMENT  2006   STENTING OF HIS CORONARY ARTERY  . EDG Dilitation  [id number]  . 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 LYMPH NODE BIOPSY  03/15/2011   Procedure: LYMPH NODE BIOPSY;  Surgeon: Earnstine Regal, MD;  Location: [facility] ORS;  Service: General;  Laterality: Right;  Right Anterior Cervical Lymph Node Excisional Biopsy   . lymph node removal  2011   right groin  . NASAL SINUS SURGERY  2005  . TONSILLECTOMY AND ADENOIDECTOMY    . US ECHOCARDIOGRAPHY  02/28/2005   EF 55-60%    SOCIAL HISTORY:   Social History   Social History  . Marital status: Married    Spouse name: N/A  . Number of children: N/A  . Years of education: N/A  Occupational History  . Not on file.   Social History  Main Topics  . Smoking status: Never Smoker  . Smokeless tobacco: Never Used  . Alcohol use No  . Drug use: No  . Sexual activity: Not Currently   Other Topics Concern  . Not on file   Social History Narrative  . No narrative on file    FAMILY HISTORY:   Family Status  Relation Status  . Father Deceased at age 73  . Mother Deceased at age 34  . Sister Alive  . Brother Alive  . Daughter Alive    ROS:  A complete 10 system review of systems was obtained and was unremarkable apart from what is mentioned above.  PHYSICAL EXAMINATION:    VITALS:   Vitals:   10/13/16 0924  BP: 108/60  Pulse: 68  SpO2: 91%  Weight: 214 lb (97.1 kg)    GEN:  The patient appears stated age and is in NAD. HEENT:  Normocephalic, atraumatic.  The mucous membranes are moist. The superficial temporal arteries are without ropiness or tenderness. CV:  RRR Lungs:  CTAB Neck/HEME:  There are no carotid bruits bilaterally.  Neurological examination:  Orientation: The patient is alert and oriented x3. Fund of knowledge is appropriate.  Recent and remote memory are intact.  Attention and concentration are normal.    Able to name objects and repeat phrases..   Cranial nerves: There is good facial symmetry. Pupils are equal round and reactive to light bilaterally. Fundoscopic exam reveals clear margins bilaterally. Extraocular muscles are intact. The visual fields are full to confrontational testing. The speech is slow and purposeful, mildy dyphasic. Soft palate rises symmetrically and there is no tongue deviation. Hearing is intact to conversational tone. Sensation: Sensation is intact to light and pinprick throughout (facial, trunk, extremities). Vibration is intact at the bilateral big toe. There is no extinction with double simultaneous stimulation. There is no sensory dermatomal level identified.  No astereognosis.   No agraphesthesia  Motor: Strength is 5/5 in the bilateral upper and lower extremities.    Shoulder shrug is equal and symmetric.  There is no pronator drift.  No fasciculations. Deep tendon reflexes: Deep tendon reflexes are 2+/4 at the bilateral biceps, triceps, brachioradialis, patella and achilles. Plantar responses are downgoing bilaterally.  Movement examination: Tone: There is normal tone in the bilateral upper extremities.  The tone in the lower extremities is normal.  Abnormal movements: none Coordination:  There is no decremation with RAM's, with any form of RAMS, including alternating supination and pronation of the forearm, hand opening and closing, finger taps, heel taps and toe taps. Gait and Station: The patient has a gait belt and requires some help to get out of the wheelchair.  He was given a walker and stand in place for about a minute before he moves.  Once he walks, he actually walks very well with the walker.  I then took a walker away from him and just gently help with gait belt and he actually continued to walk very well.  He had mild difficulty with turns.  He did not shuffle.  ASSESSMENT/PLAN:  1.  primary progessive aphasia  -DaT scan done on 10/06/16 was subtly positive which is often the case in PPA  -off of levodopa now and looks the same from a motor standpoint.  No evidence of idiopathic PD.  -will try to find support group in [place] area  -talked about palliative care  -talked about communication devices for  the future   2.  PMR  -may need to be seen by rheum.  Told him that the pain is unrelated to PPA and likely related to polymyalgia rheumatica  3.  Tremor  -Actually did not see any on my examination today.  He and his wife report a 10+ year history of essential tremor, primarily of the left hand and it sounds like this condition has worsened with the course of time.  I think that is the primary etiology of what is going on with the left hand.  With a described in the legs sounded somewhat like orthostatic tremor and I wonder if that is why  clonazepam helped, but I asked them to videotape an episode of the has one, which would be very helpful.  Regardless, the clonazepam helps and I would recommend that he continues this.  [facility] records indicated that he had some myoclonus as well, which clonazepam should be helpful for her as well.  4.  AML with leptomeningeal dissemination  -Pt is seeing neuro-onc at [facility].  Much greater than 50% of this visit was spent in counseling and coordinating care.  Total face to face time:  40 min.  Will send referral to [facility] as patient moving out of the area.  Cc:  Burnard Bunting, MD

## 2016-10-13 ENCOUNTER — Telehealth: Payer: Self-pay | Admitting: Clinical

## 2016-10-13 ENCOUNTER — Other Ambulatory Visit: Payer: Self-pay | Admitting: Emergency Medicine

## 2016-10-13 ENCOUNTER — Ambulatory Visit (INDEPENDENT_AMBULATORY_CARE_PROVIDER_SITE_OTHER): Payer: Medicare Other | Admitting: Neurology

## 2016-10-13 ENCOUNTER — Encounter: Payer: Self-pay | Admitting: Neurology

## 2016-10-13 VITALS — BP 108/60 | HR 68 | Wt 214.0 lb

## 2016-10-13 DIAGNOSIS — R251 Tremor, unspecified: Secondary | ICD-10-CM

## 2016-10-13 DIAGNOSIS — G3101 Pick's disease: Secondary | ICD-10-CM | POA: Diagnosis not present

## 2016-10-13 DIAGNOSIS — G20 Parkinson's disease: Secondary | ICD-10-CM

## 2016-10-13 DIAGNOSIS — F0280 Dementia in other diseases classified elsewhere without behavioral disturbance: Secondary | ICD-10-CM | POA: Diagnosis not present

## 2016-10-13 DIAGNOSIS — M353 Polymyalgia rheumatica: Secondary | ICD-10-CM

## 2016-10-13 NOTE — Telephone Encounter (Signed)
CSW contacted pt wife and discussed resources through [employer] located in [place]. CSW provided supportive listening as pt wife discussed that she plan to explore resource as she feels any support will be helpful for pt and pt family. CSW discussed that CSW has placed some information in the mail about [employer] and the process for new client's. CSW discussed that the [employer] web site has a lot of information and pt wife may benefit from exploring that as well. Pt wife encouraged to contact this CSW if any further social work needs arise. Pt wife appreciative of phone call and resources being mailed.   Alison Murray, MSW, LCSW Clinical Social Worker [facility] [phone]

## 2016-10-13 NOTE — Telephone Encounter (Signed)
-----   Message from Burns, DO sent at 10/13/2016  9:46 AM EDT ----- Patient needs to find out if there is a support group for PPA.  Will be moving to [place] so fine if there

## 2016-10-20 ENCOUNTER — Ambulatory Visit: Payer: Medicare Other | Admitting: Neurology

## 2016-10-21 DIAGNOSIS — C9200 Acute myeloblastic leukemia, not having achieved remission: Secondary | ICD-10-CM | POA: Diagnosis not present

## 2016-10-21 DIAGNOSIS — D696 Thrombocytopenia, unspecified: Secondary | ICD-10-CM | POA: Diagnosis not present

## 2016-10-21 DIAGNOSIS — R05 Cough: Secondary | ICD-10-CM | POA: Diagnosis not present

## 2016-10-28 ENCOUNTER — Telehealth: Payer: Self-pay | Admitting: Neurology

## 2016-10-28 NOTE — Telephone Encounter (Signed)
Caller: Reginold Agent (wife)   Urgent? No  Reason for the call: Her husband's Speech Aphasia is getting much worse. She said she has several questions and would like to speak with you as far as how to plan for the future? She said they will be moving at some point to [facility] and will need to transfer care. She is greatly frustrated and would really like to speak with you. Please call. Thanks

## 2016-10-31 NOTE — Telephone Encounter (Signed)
Patient's wife made aware of information.

## 2016-10-31 NOTE — Telephone Encounter (Signed)
We discussed most of this last visit and unfortunately much doesn't have discrete answers.  Lifespan can range from 3-12 years from dx but often is closer to 8-10.  Cause of death is varied but often just due to complications like pneumonia from swallowing trouble.  Many patients will die from other diseases.  I recommend assisted living when he/she cannot take care of ADL's.  How long has their move been delayed?  Based on our visit last time, patients wife was very overwhelmed and it may help her to find a counselor to discuss these issues with.

## 2016-10-31 NOTE — Telephone Encounter (Signed)
Spoke with patient's wife.  She had some questions about progression of disease, etc. I answered to the best of my ability, and made her aware everyone's disease is different but she would like to know the following: 1. What is the normal lifespan from diagnosis?  2. What is usually the cause of death? (complications of disease, etc) 3. At what point would you recommend an assisted living facility?  4. What changes should she be looking out for?   Their move to [facility] has been delayed and she wants to know the answers to these questions to better decide on housing once they move. Please advise.

## 2016-11-01 ENCOUNTER — Telehealth: Payer: Self-pay | Admitting: Neurology

## 2016-11-01 NOTE — Telephone Encounter (Signed)
Is this something that is done?

## 2016-11-01 NOTE — Telephone Encounter (Signed)
Patient's wife made aware.

## 2016-11-01 NOTE — Telephone Encounter (Signed)
In the vast majority of cases, PPA is not genetic.  This is not useful.  When are they moving to [facility]? There are genetic counselors in [employer] area that may be helpful but again, I think that counseling in general could be very beneficial here.

## 2016-11-01 NOTE — Telephone Encounter (Signed)
Nathan Kemp Wife [phone]  Denice Paradise called would like to know if it is possible to get Genetic Testing for Nathan Kemp recent diagnosis of Primary Progressive Aphasia for the sake of their children and grandchildren. Please call and advise

## 2016-11-07 DIAGNOSIS — E119 Type 2 diabetes mellitus without complications: Secondary | ICD-10-CM | POA: Diagnosis not present

## 2016-11-07 DIAGNOSIS — E11622 Type 2 diabetes mellitus with other skin ulcer: Secondary | ICD-10-CM | POA: Diagnosis not present

## 2016-11-07 DIAGNOSIS — I872 Venous insufficiency (chronic) (peripheral): Secondary | ICD-10-CM | POA: Diagnosis not present

## 2016-11-07 DIAGNOSIS — I2510 Atherosclerotic heart disease of native coronary artery without angina pectoris: Secondary | ICD-10-CM | POA: Diagnosis not present

## 2016-11-07 DIAGNOSIS — L97921 Non-pressure chronic ulcer of unspecified part of left lower leg limited to breakdown of skin: Secondary | ICD-10-CM | POA: Diagnosis not present

## 2016-11-07 DIAGNOSIS — I10 Essential (primary) hypertension: Secondary | ICD-10-CM | POA: Diagnosis not present

## 2016-11-07 DIAGNOSIS — E039 Hypothyroidism, unspecified: Secondary | ICD-10-CM | POA: Diagnosis not present

## 2016-11-07 DIAGNOSIS — L97321 Non-pressure chronic ulcer of left ankle limited to breakdown of skin: Secondary | ICD-10-CM | POA: Diagnosis not present

## 2016-11-14 DIAGNOSIS — E119 Type 2 diabetes mellitus without complications: Secondary | ICD-10-CM | POA: Diagnosis not present

## 2016-11-14 DIAGNOSIS — I2510 Atherosclerotic heart disease of native coronary artery without angina pectoris: Secondary | ICD-10-CM | POA: Diagnosis not present

## 2016-11-14 DIAGNOSIS — I878 Other specified disorders of veins: Secondary | ICD-10-CM | POA: Diagnosis not present

## 2016-11-14 DIAGNOSIS — I10 Essential (primary) hypertension: Secondary | ICD-10-CM | POA: Diagnosis not present

## 2016-11-18 DIAGNOSIS — I119 Hypertensive heart disease without heart failure: Secondary | ICD-10-CM | POA: Diagnosis not present

## 2016-11-18 DIAGNOSIS — H578 Other specified disorders of eye and adnexa: Secondary | ICD-10-CM | POA: Diagnosis not present

## 2016-11-18 DIAGNOSIS — Z95828 Presence of other vascular implants and grafts: Secondary | ICD-10-CM | POA: Diagnosis not present

## 2016-11-18 DIAGNOSIS — R479 Unspecified speech disturbances: Secondary | ICD-10-CM | POA: Diagnosis not present

## 2016-11-18 DIAGNOSIS — G4700 Insomnia, unspecified: Secondary | ICD-10-CM | POA: Diagnosis not present

## 2016-11-18 DIAGNOSIS — Z794 Long term (current) use of insulin: Secondary | ICD-10-CM | POA: Diagnosis not present

## 2016-11-18 DIAGNOSIS — G20 Parkinson's disease: Secondary | ICD-10-CM | POA: Diagnosis not present

## 2016-11-18 DIAGNOSIS — E119 Type 2 diabetes mellitus without complications: Secondary | ICD-10-CM | POA: Diagnosis not present

## 2016-11-18 DIAGNOSIS — Z636 Dependent relative needing care at home: Secondary | ICD-10-CM | POA: Diagnosis not present

## 2016-11-18 DIAGNOSIS — K589 Irritable bowel syndrome without diarrhea: Secondary | ICD-10-CM | POA: Diagnosis not present

## 2016-11-18 DIAGNOSIS — C9200 Acute myeloblastic leukemia, not having achieved remission: Secondary | ICD-10-CM | POA: Diagnosis not present

## 2016-11-18 DIAGNOSIS — M353 Polymyalgia rheumatica: Secondary | ICD-10-CM | POA: Diagnosis not present

## 2016-11-18 DIAGNOSIS — G3101 Pick's disease: Secondary | ICD-10-CM | POA: Diagnosis not present

## 2016-11-18 DIAGNOSIS — D696 Thrombocytopenia, unspecified: Secondary | ICD-10-CM | POA: Diagnosis not present

## 2016-11-18 DIAGNOSIS — E039 Hypothyroidism, unspecified: Secondary | ICD-10-CM | POA: Diagnosis not present

## 2016-11-18 DIAGNOSIS — Z959 Presence of cardiac and vascular implant and graft, unspecified: Secondary | ICD-10-CM | POA: Diagnosis not present

## 2016-11-24 DIAGNOSIS — I872 Venous insufficiency (chronic) (peripheral): Secondary | ICD-10-CM | POA: Diagnosis not present

## 2016-11-24 DIAGNOSIS — G20 Parkinson's disease: Secondary | ICD-10-CM | POA: Diagnosis not present

## 2016-11-24 DIAGNOSIS — C9250 Acute myelomonocytic leukemia, not having achieved remission: Secondary | ICD-10-CM | POA: Diagnosis not present

## 2016-11-24 DIAGNOSIS — I10 Essential (primary) hypertension: Secondary | ICD-10-CM | POA: Diagnosis not present

## 2016-11-24 DIAGNOSIS — Z6832 Body mass index (BMI) 32.0-32.9, adult: Secondary | ICD-10-CM | POA: Diagnosis not present

## 2016-11-24 DIAGNOSIS — M353 Polymyalgia rheumatica: Secondary | ICD-10-CM | POA: Diagnosis not present

## 2016-12-08 ENCOUNTER — Telehealth: Payer: Self-pay | Admitting: Neurology

## 2016-12-08 NOTE — Telephone Encounter (Signed)
No, he doesn't need to.  I just was providing them information on the disease and gave him a diagnosis but no meds were being prescribed.  A counselor may be of great value to help deal with the frustration of the disease and [facility] has a speech therapy clinic that may help with devices if they get that far but for now, he doesn't need that (and we had already discussed that)

## 2016-12-08 NOTE — Telephone Encounter (Signed)
Patient last seen in June. Not sure if you would need to see him back? (no on any medications from you). Please advise.

## 2016-12-08 NOTE — Telephone Encounter (Signed)
Patient's wife made aware. She expressed appreciation.

## 2016-12-08 NOTE — Telephone Encounter (Signed)
His wife Denice Paradise called to let Dr. Carles Collet know that originally they were to be moving to the [place] area and Dr. Carles Collet was going to give him a referral to [facility] The plans have stalled for now and they would like to know should he make a follow up appointment with Dr. Carles Collet? Please Advise. Thanks

## 2016-12-30 DIAGNOSIS — R600 Localized edema: Secondary | ICD-10-CM | POA: Diagnosis not present

## 2016-12-30 DIAGNOSIS — Z95828 Presence of other vascular implants and grafts: Secondary | ICD-10-CM | POA: Diagnosis not present

## 2016-12-30 DIAGNOSIS — Z749 Problem related to care provider dependency, unspecified: Secondary | ICD-10-CM | POA: Diagnosis not present

## 2016-12-30 DIAGNOSIS — R51 Headache: Secondary | ICD-10-CM | POA: Diagnosis not present

## 2016-12-30 DIAGNOSIS — H578 Other specified disorders of eye and adnexa: Secondary | ICD-10-CM | POA: Diagnosis not present

## 2016-12-30 DIAGNOSIS — C9200 Acute myeloblastic leukemia, not having achieved remission: Secondary | ICD-10-CM | POA: Diagnosis not present

## 2016-12-30 DIAGNOSIS — R5383 Other fatigue: Secondary | ICD-10-CM | POA: Diagnosis not present

## 2016-12-30 DIAGNOSIS — R4701 Aphasia: Secondary | ICD-10-CM | POA: Diagnosis not present

## 2016-12-30 DIAGNOSIS — G4700 Insomnia, unspecified: Secondary | ICD-10-CM | POA: Diagnosis not present

## 2016-12-30 DIAGNOSIS — I119 Hypertensive heart disease without heart failure: Secondary | ICD-10-CM | POA: Diagnosis not present

## 2016-12-30 DIAGNOSIS — M353 Polymyalgia rheumatica: Secondary | ICD-10-CM | POA: Diagnosis not present

## 2016-12-30 DIAGNOSIS — E039 Hypothyroidism, unspecified: Secondary | ICD-10-CM | POA: Diagnosis not present

## 2016-12-30 DIAGNOSIS — E119 Type 2 diabetes mellitus without complications: Secondary | ICD-10-CM | POA: Diagnosis not present

## 2016-12-30 DIAGNOSIS — C9201 Acute myeloblastic leukemia, in remission: Secondary | ICD-10-CM | POA: Diagnosis not present

## 2016-12-30 DIAGNOSIS — R531 Weakness: Secondary | ICD-10-CM | POA: Diagnosis not present

## 2016-12-30 DIAGNOSIS — K589 Irritable bowel syndrome without diarrhea: Secondary | ICD-10-CM | POA: Diagnosis not present

## 2016-12-30 DIAGNOSIS — G20 Parkinson's disease: Secondary | ICD-10-CM | POA: Diagnosis not present

## 2017-01-04 ENCOUNTER — Telehealth: Payer: Self-pay | Admitting: Neurology

## 2017-01-04 DIAGNOSIS — G3101 Pick's disease: Principal | ICD-10-CM

## 2017-01-04 DIAGNOSIS — F0280 Dementia in other diseases classified elsewhere without behavioral disturbance: Secondary | ICD-10-CM

## 2017-01-04 NOTE — Telephone Encounter (Signed)
Patient's wife called needing Doctor Tat to Fax [phone]) a Referral to Dr. Loma Messing Office at [facility]. Please Advise. Thanks

## 2017-01-05 NOTE — Telephone Encounter (Signed)
Referral faxed to provided number with confirmation received.

## 2017-01-17 DIAGNOSIS — R531 Weakness: Secondary | ICD-10-CM | POA: Diagnosis not present

## 2017-01-24 DIAGNOSIS — Z95828 Presence of other vascular implants and grafts: Secondary | ICD-10-CM | POA: Diagnosis not present

## 2017-01-24 DIAGNOSIS — G20 Parkinson's disease: Secondary | ICD-10-CM | POA: Diagnosis not present

## 2017-01-24 DIAGNOSIS — R4789 Other speech disturbances: Secondary | ICD-10-CM | POA: Diagnosis not present

## 2017-01-24 DIAGNOSIS — C9201 Acute myeloblastic leukemia, in remission: Secondary | ICD-10-CM | POA: Diagnosis not present

## 2017-01-24 DIAGNOSIS — E119 Type 2 diabetes mellitus without complications: Secondary | ICD-10-CM | POA: Diagnosis not present

## 2017-01-24 DIAGNOSIS — I119 Hypertensive heart disease without heart failure: Secondary | ICD-10-CM | POA: Diagnosis not present

## 2017-01-24 DIAGNOSIS — K589 Irritable bowel syndrome without diarrhea: Secondary | ICD-10-CM | POA: Diagnosis not present

## 2017-01-24 DIAGNOSIS — Z636 Dependent relative needing care at home: Secondary | ICD-10-CM | POA: Diagnosis not present

## 2017-01-24 DIAGNOSIS — R600 Localized edema: Secondary | ICD-10-CM | POA: Diagnosis not present

## 2017-01-24 DIAGNOSIS — E039 Hypothyroidism, unspecified: Secondary | ICD-10-CM | POA: Diagnosis not present

## 2017-01-24 DIAGNOSIS — Z794 Long term (current) use of insulin: Secondary | ICD-10-CM | POA: Diagnosis not present

## 2017-01-24 DIAGNOSIS — Z23 Encounter for immunization: Secondary | ICD-10-CM | POA: Diagnosis not present

## 2017-01-24 DIAGNOSIS — Z7952 Long term (current) use of systemic steroids: Secondary | ICD-10-CM | POA: Diagnosis not present

## 2017-01-24 DIAGNOSIS — G4700 Insomnia, unspecified: Secondary | ICD-10-CM | POA: Diagnosis not present

## 2017-01-24 DIAGNOSIS — M353 Polymyalgia rheumatica: Secondary | ICD-10-CM | POA: Diagnosis not present

## 2017-01-24 DIAGNOSIS — H5789 Other specified disorders of eye and adnexa: Secondary | ICD-10-CM | POA: Diagnosis not present

## 2017-01-26 DIAGNOSIS — I10 Essential (primary) hypertension: Secondary | ICD-10-CM | POA: Diagnosis not present

## 2017-01-26 DIAGNOSIS — I872 Venous insufficiency (chronic) (peripheral): Secondary | ICD-10-CM | POA: Diagnosis not present

## 2017-01-26 DIAGNOSIS — M353 Polymyalgia rheumatica: Secondary | ICD-10-CM | POA: Diagnosis not present

## 2017-01-26 DIAGNOSIS — Z7952 Long term (current) use of systemic steroids: Secondary | ICD-10-CM | POA: Diagnosis not present

## 2017-01-26 DIAGNOSIS — K219 Gastro-esophageal reflux disease without esophagitis: Secondary | ICD-10-CM | POA: Diagnosis not present

## 2017-01-26 DIAGNOSIS — E669 Obesity, unspecified: Secondary | ICD-10-CM | POA: Diagnosis not present

## 2017-01-26 DIAGNOSIS — E119 Type 2 diabetes mellitus without complications: Secondary | ICD-10-CM | POA: Diagnosis not present

## 2017-01-26 DIAGNOSIS — C9250 Acute myelomonocytic leukemia, not having achieved remission: Secondary | ICD-10-CM | POA: Diagnosis not present

## 2017-01-26 DIAGNOSIS — F329 Major depressive disorder, single episode, unspecified: Secondary | ICD-10-CM | POA: Diagnosis not present

## 2017-01-26 DIAGNOSIS — C8305 Small cell B-cell lymphoma, lymph nodes of inguinal region and lower limb: Secondary | ICD-10-CM | POA: Diagnosis not present

## 2017-01-26 DIAGNOSIS — G20 Parkinson's disease: Secondary | ICD-10-CM | POA: Diagnosis not present

## 2017-01-26 DIAGNOSIS — D696 Thrombocytopenia, unspecified: Secondary | ICD-10-CM | POA: Diagnosis not present

## 2017-02-01 DIAGNOSIS — I890 Lymphedema, not elsewhere classified: Secondary | ICD-10-CM | POA: Diagnosis not present

## 2017-02-01 DIAGNOSIS — E119 Type 2 diabetes mellitus without complications: Secondary | ICD-10-CM | POA: Diagnosis not present

## 2017-02-01 DIAGNOSIS — S61412A Laceration without foreign body of left hand, initial encounter: Secondary | ICD-10-CM | POA: Diagnosis not present

## 2017-02-01 DIAGNOSIS — S51012A Laceration without foreign body of left elbow, initial encounter: Secondary | ICD-10-CM | POA: Diagnosis not present

## 2017-02-01 DIAGNOSIS — E039 Hypothyroidism, unspecified: Secondary | ICD-10-CM | POA: Diagnosis not present

## 2017-02-01 DIAGNOSIS — I10 Essential (primary) hypertension: Secondary | ICD-10-CM | POA: Diagnosis not present

## 2017-02-01 DIAGNOSIS — I2510 Atherosclerotic heart disease of native coronary artery without angina pectoris: Secondary | ICD-10-CM | POA: Diagnosis not present

## 2017-02-01 DIAGNOSIS — Z856 Personal history of leukemia: Secondary | ICD-10-CM | POA: Diagnosis not present

## 2017-02-01 DIAGNOSIS — D61810 Antineoplastic chemotherapy induced pancytopenia: Secondary | ICD-10-CM | POA: Diagnosis not present

## 2017-02-02 DIAGNOSIS — M353 Polymyalgia rheumatica: Secondary | ICD-10-CM | POA: Diagnosis not present

## 2017-02-02 DIAGNOSIS — C9250 Acute myelomonocytic leukemia, not having achieved remission: Secondary | ICD-10-CM | POA: Diagnosis not present

## 2017-02-02 DIAGNOSIS — D696 Thrombocytopenia, unspecified: Secondary | ICD-10-CM | POA: Diagnosis not present

## 2017-02-02 DIAGNOSIS — C8305 Small cell B-cell lymphoma, lymph nodes of inguinal region and lower limb: Secondary | ICD-10-CM | POA: Diagnosis not present

## 2017-02-02 DIAGNOSIS — E119 Type 2 diabetes mellitus without complications: Secondary | ICD-10-CM | POA: Diagnosis not present

## 2017-02-02 DIAGNOSIS — G20 Parkinson's disease: Secondary | ICD-10-CM | POA: Diagnosis not present

## 2017-02-09 DIAGNOSIS — S51002D Unspecified open wound of left elbow, subsequent encounter: Secondary | ICD-10-CM | POA: Diagnosis not present

## 2017-02-09 DIAGNOSIS — S61402D Unspecified open wound of left hand, subsequent encounter: Secondary | ICD-10-CM | POA: Diagnosis not present

## 2017-02-09 DIAGNOSIS — L97811 Non-pressure chronic ulcer of other part of right lower leg limited to breakdown of skin: Secondary | ICD-10-CM | POA: Diagnosis not present

## 2017-02-09 DIAGNOSIS — E119 Type 2 diabetes mellitus without complications: Secondary | ICD-10-CM | POA: Diagnosis not present

## 2017-02-09 DIAGNOSIS — Z794 Long term (current) use of insulin: Secondary | ICD-10-CM | POA: Diagnosis not present

## 2017-02-09 DIAGNOSIS — M7989 Other specified soft tissue disorders: Secondary | ICD-10-CM | POA: Diagnosis not present

## 2017-02-09 DIAGNOSIS — R600 Localized edema: Secondary | ICD-10-CM | POA: Diagnosis not present

## 2017-02-09 DIAGNOSIS — I872 Venous insufficiency (chronic) (peripheral): Secondary | ICD-10-CM | POA: Diagnosis not present

## 2017-02-09 DIAGNOSIS — R2241 Localized swelling, mass and lump, right lower limb: Secondary | ICD-10-CM | POA: Diagnosis not present

## 2017-02-09 DIAGNOSIS — S61412D Laceration without foreign body of left hand, subsequent encounter: Secondary | ICD-10-CM | POA: Diagnosis not present

## 2017-02-10 DIAGNOSIS — D696 Thrombocytopenia, unspecified: Secondary | ICD-10-CM | POA: Diagnosis not present

## 2017-02-10 DIAGNOSIS — C9250 Acute myelomonocytic leukemia, not having achieved remission: Secondary | ICD-10-CM | POA: Diagnosis not present

## 2017-02-10 DIAGNOSIS — G20 Parkinson's disease: Secondary | ICD-10-CM | POA: Diagnosis not present

## 2017-02-10 DIAGNOSIS — M353 Polymyalgia rheumatica: Secondary | ICD-10-CM | POA: Diagnosis not present

## 2017-02-10 DIAGNOSIS — E119 Type 2 diabetes mellitus without complications: Secondary | ICD-10-CM | POA: Diagnosis not present

## 2017-02-10 DIAGNOSIS — C8305 Small cell B-cell lymphoma, lymph nodes of inguinal region and lower limb: Secondary | ICD-10-CM | POA: Diagnosis not present

## 2017-02-12 DIAGNOSIS — S61412A Laceration without foreign body of left hand, initial encounter: Secondary | ICD-10-CM | POA: Diagnosis not present

## 2017-02-12 DIAGNOSIS — W19XXXA Unspecified fall, initial encounter: Secondary | ICD-10-CM | POA: Diagnosis not present

## 2017-02-12 DIAGNOSIS — R251 Tremor, unspecified: Secondary | ICD-10-CM | POA: Diagnosis not present

## 2017-02-12 DIAGNOSIS — R4701 Aphasia: Secondary | ICD-10-CM | POA: Diagnosis not present

## 2017-02-12 DIAGNOSIS — G20 Parkinson's disease: Secondary | ICD-10-CM | POA: Diagnosis not present

## 2017-02-12 DIAGNOSIS — Y998 Other external cause status: Secondary | ICD-10-CM | POA: Diagnosis not present

## 2017-02-12 DIAGNOSIS — Z955 Presence of coronary angioplasty implant and graft: Secondary | ICD-10-CM | POA: Diagnosis not present

## 2017-02-12 DIAGNOSIS — D696 Thrombocytopenia, unspecified: Secondary | ICD-10-CM | POA: Diagnosis not present

## 2017-02-12 DIAGNOSIS — S81811A Laceration without foreign body, right lower leg, initial encounter: Secondary | ICD-10-CM | POA: Diagnosis not present

## 2017-02-12 DIAGNOSIS — J45909 Unspecified asthma, uncomplicated: Secondary | ICD-10-CM | POA: Diagnosis not present

## 2017-02-12 DIAGNOSIS — E119 Type 2 diabetes mellitus without complications: Secondary | ICD-10-CM | POA: Diagnosis not present

## 2017-02-12 DIAGNOSIS — E039 Hypothyroidism, unspecified: Secondary | ICD-10-CM | POA: Diagnosis not present

## 2017-02-12 DIAGNOSIS — W1839XA Other fall on same level, initial encounter: Secondary | ICD-10-CM | POA: Diagnosis not present

## 2017-02-12 DIAGNOSIS — I10 Essential (primary) hypertension: Secondary | ICD-10-CM | POA: Diagnosis not present

## 2017-02-12 DIAGNOSIS — I2510 Atherosclerotic heart disease of native coronary artery without angina pectoris: Secondary | ICD-10-CM | POA: Diagnosis not present

## 2017-02-13 ENCOUNTER — Telehealth: Payer: Self-pay | Admitting: Neurology

## 2017-02-13 DIAGNOSIS — G20 Parkinson's disease: Secondary | ICD-10-CM | POA: Diagnosis not present

## 2017-02-13 DIAGNOSIS — C8305 Small cell B-cell lymphoma, lymph nodes of inguinal region and lower limb: Secondary | ICD-10-CM | POA: Diagnosis not present

## 2017-02-13 DIAGNOSIS — M353 Polymyalgia rheumatica: Secondary | ICD-10-CM | POA: Diagnosis not present

## 2017-02-13 DIAGNOSIS — C9250 Acute myelomonocytic leukemia, not having achieved remission: Secondary | ICD-10-CM | POA: Diagnosis not present

## 2017-02-13 DIAGNOSIS — D696 Thrombocytopenia, unspecified: Secondary | ICD-10-CM | POA: Diagnosis not present

## 2017-02-13 DIAGNOSIS — E119 Type 2 diabetes mellitus without complications: Secondary | ICD-10-CM | POA: Diagnosis not present

## 2017-02-13 NOTE — Telephone Encounter (Signed)
Pt's wife called and said pt's parkinson's has intensified and would like a call back to please advise

## 2017-02-13 NOTE — Telephone Encounter (Signed)
Spoke with patient's wife. She states patient has gotten worse in the last two weeks. He has had two falls and significant increase in tremor. He is unable to walk without someone beside him now, and wife is having to carry a chair behind him because he often can't make it to where he is going without stopping to sit.   His wife is very frustrated and doesn't know what to do anymore. She questions how fast this progresses and when to talk about assisted living. I offered to have our social worker reach out to her, but she states she is already working with a Education officer, museum at Palliative care. She actually has someone from [facility] coming out to evaluate the patient and the home tomorrow to let her know what services or equipment would be helpful. They are planning on seeing someone is [facility] for care, but don't have an appointment until May 06, 2017.  I saw where patient was seen at [facility] ER yesterday for these issues. It looks like they did a urine culture for possible UTI. Results not back yet. Wife made aware UTI's can exacerbate symptoms. She was told this yesterday as well. She states patient has had difficulty starting his stream. I advised this very well could be making symptoms worse.   I did advise it sounds like she is doing all the right things, having patient evaluated by Palliative care. They have not sought counseling yet and I again encouraged them to do this to help deal with stress.   I let her know I would run this information by Dr. Carles Collet and call back if she had any alternative thoughts. Dr. Carles Collet Juluis Rainier.

## 2017-02-13 NOTE — Telephone Encounter (Signed)
I agree with everything you told her.  This generally progresses but not as an acute decline.    I think that palliative would be great for them.  When are they moving?

## 2017-02-15 DIAGNOSIS — G20 Parkinson's disease: Secondary | ICD-10-CM | POA: Diagnosis not present

## 2017-02-15 DIAGNOSIS — C9250 Acute myelomonocytic leukemia, not having achieved remission: Secondary | ICD-10-CM | POA: Diagnosis not present

## 2017-02-15 DIAGNOSIS — M353 Polymyalgia rheumatica: Secondary | ICD-10-CM | POA: Diagnosis not present

## 2017-02-15 DIAGNOSIS — E119 Type 2 diabetes mellitus without complications: Secondary | ICD-10-CM | POA: Diagnosis not present

## 2017-02-15 DIAGNOSIS — C8305 Small cell B-cell lymphoma, lymph nodes of inguinal region and lower limb: Secondary | ICD-10-CM | POA: Diagnosis not present

## 2017-02-15 DIAGNOSIS — D696 Thrombocytopenia, unspecified: Secondary | ICD-10-CM | POA: Diagnosis not present

## 2017-02-16 DIAGNOSIS — D696 Thrombocytopenia, unspecified: Secondary | ICD-10-CM | POA: Diagnosis not present

## 2017-02-16 DIAGNOSIS — C9250 Acute myelomonocytic leukemia, not having achieved remission: Secondary | ICD-10-CM | POA: Diagnosis not present

## 2017-02-16 DIAGNOSIS — G20 Parkinson's disease: Secondary | ICD-10-CM | POA: Diagnosis not present

## 2017-02-16 DIAGNOSIS — M353 Polymyalgia rheumatica: Secondary | ICD-10-CM | POA: Diagnosis not present

## 2017-02-16 DIAGNOSIS — C8305 Small cell B-cell lymphoma, lymph nodes of inguinal region and lower limb: Secondary | ICD-10-CM | POA: Diagnosis not present

## 2017-02-16 DIAGNOSIS — E119 Type 2 diabetes mellitus without complications: Secondary | ICD-10-CM | POA: Diagnosis not present

## 2017-02-17 DIAGNOSIS — E039 Hypothyroidism, unspecified: Secondary | ICD-10-CM | POA: Diagnosis not present

## 2017-02-17 DIAGNOSIS — I878 Other specified disorders of veins: Secondary | ICD-10-CM | POA: Diagnosis not present

## 2017-02-17 DIAGNOSIS — I2510 Atherosclerotic heart disease of native coronary artery without angina pectoris: Secondary | ICD-10-CM | POA: Diagnosis not present

## 2017-02-17 DIAGNOSIS — S61412D Laceration without foreign body of left hand, subsequent encounter: Secondary | ICD-10-CM | POA: Diagnosis not present

## 2017-02-17 DIAGNOSIS — E11622 Type 2 diabetes mellitus with other skin ulcer: Secondary | ICD-10-CM | POA: Diagnosis not present

## 2017-02-17 DIAGNOSIS — L97911 Non-pressure chronic ulcer of unspecified part of right lower leg limited to breakdown of skin: Secondary | ICD-10-CM | POA: Diagnosis not present

## 2017-02-17 DIAGNOSIS — Z856 Personal history of leukemia: Secondary | ICD-10-CM | POA: Diagnosis not present

## 2017-02-17 DIAGNOSIS — I872 Venous insufficiency (chronic) (peripheral): Secondary | ICD-10-CM | POA: Diagnosis not present

## 2017-02-17 DIAGNOSIS — S51012D Laceration without foreign body of left elbow, subsequent encounter: Secondary | ICD-10-CM | POA: Diagnosis not present

## 2017-02-17 DIAGNOSIS — I10 Essential (primary) hypertension: Secondary | ICD-10-CM | POA: Diagnosis not present

## 2017-02-21 DIAGNOSIS — S61412D Laceration without foreign body of left hand, subsequent encounter: Secondary | ICD-10-CM | POA: Diagnosis not present

## 2017-02-21 DIAGNOSIS — E11622 Type 2 diabetes mellitus with other skin ulcer: Secondary | ICD-10-CM | POA: Diagnosis not present

## 2017-02-21 DIAGNOSIS — E039 Hypothyroidism, unspecified: Secondary | ICD-10-CM | POA: Diagnosis not present

## 2017-02-21 DIAGNOSIS — I10 Essential (primary) hypertension: Secondary | ICD-10-CM | POA: Diagnosis not present

## 2017-02-21 DIAGNOSIS — L97919 Non-pressure chronic ulcer of unspecified part of right lower leg with unspecified severity: Secondary | ICD-10-CM | POA: Diagnosis not present

## 2017-02-21 DIAGNOSIS — R600 Localized edema: Secondary | ICD-10-CM | POA: Diagnosis not present

## 2017-02-21 DIAGNOSIS — I890 Lymphedema, not elsewhere classified: Secondary | ICD-10-CM | POA: Diagnosis not present

## 2017-02-21 DIAGNOSIS — I2510 Atherosclerotic heart disease of native coronary artery without angina pectoris: Secondary | ICD-10-CM | POA: Diagnosis not present

## 2017-02-21 DIAGNOSIS — I878 Other specified disorders of veins: Secondary | ICD-10-CM | POA: Diagnosis not present

## 2017-02-21 DIAGNOSIS — S51012D Laceration without foreign body of left elbow, subsequent encounter: Secondary | ICD-10-CM | POA: Diagnosis not present

## 2017-02-21 DIAGNOSIS — I83019 Varicose veins of right lower extremity with ulcer of unspecified site: Secondary | ICD-10-CM | POA: Diagnosis not present

## 2017-02-22 DIAGNOSIS — E119 Type 2 diabetes mellitus without complications: Secondary | ICD-10-CM | POA: Diagnosis not present

## 2017-02-22 DIAGNOSIS — M353 Polymyalgia rheumatica: Secondary | ICD-10-CM | POA: Diagnosis not present

## 2017-02-22 DIAGNOSIS — C9250 Acute myelomonocytic leukemia, not having achieved remission: Secondary | ICD-10-CM | POA: Diagnosis not present

## 2017-02-22 DIAGNOSIS — G20 Parkinson's disease: Secondary | ICD-10-CM | POA: Diagnosis not present

## 2017-02-22 DIAGNOSIS — D696 Thrombocytopenia, unspecified: Secondary | ICD-10-CM | POA: Diagnosis not present

## 2017-02-22 DIAGNOSIS — C8305 Small cell B-cell lymphoma, lymph nodes of inguinal region and lower limb: Secondary | ICD-10-CM | POA: Diagnosis not present

## 2017-02-23 DIAGNOSIS — G20 Parkinson's disease: Secondary | ICD-10-CM | POA: Diagnosis not present

## 2017-02-23 DIAGNOSIS — C8305 Small cell B-cell lymphoma, lymph nodes of inguinal region and lower limb: Secondary | ICD-10-CM | POA: Diagnosis not present

## 2017-02-23 DIAGNOSIS — D696 Thrombocytopenia, unspecified: Secondary | ICD-10-CM | POA: Diagnosis not present

## 2017-02-23 DIAGNOSIS — C9250 Acute myelomonocytic leukemia, not having achieved remission: Secondary | ICD-10-CM | POA: Diagnosis not present

## 2017-02-23 DIAGNOSIS — M353 Polymyalgia rheumatica: Secondary | ICD-10-CM | POA: Diagnosis not present

## 2017-02-23 DIAGNOSIS — E119 Type 2 diabetes mellitus without complications: Secondary | ICD-10-CM | POA: Diagnosis not present

## 2017-02-24 DIAGNOSIS — E039 Hypothyroidism, unspecified: Secondary | ICD-10-CM | POA: Diagnosis not present

## 2017-02-24 DIAGNOSIS — R5383 Other fatigue: Secondary | ICD-10-CM | POA: Diagnosis not present

## 2017-02-24 DIAGNOSIS — M25669 Stiffness of unspecified knee, not elsewhere classified: Secondary | ICD-10-CM | POA: Diagnosis not present

## 2017-02-24 DIAGNOSIS — E119 Type 2 diabetes mellitus without complications: Secondary | ICD-10-CM | POA: Diagnosis not present

## 2017-02-24 DIAGNOSIS — R251 Tremor, unspecified: Secondary | ICD-10-CM | POA: Diagnosis not present

## 2017-02-24 DIAGNOSIS — C9200 Acute myeloblastic leukemia, not having achieved remission: Secondary | ICD-10-CM | POA: Diagnosis not present

## 2017-02-24 DIAGNOSIS — G20 Parkinson's disease: Secondary | ICD-10-CM | POA: Diagnosis not present

## 2017-02-24 DIAGNOSIS — I872 Venous insufficiency (chronic) (peripheral): Secondary | ICD-10-CM | POA: Diagnosis not present

## 2017-02-24 DIAGNOSIS — L97812 Non-pressure chronic ulcer of other part of right lower leg with fat layer exposed: Secondary | ICD-10-CM | POA: Diagnosis not present

## 2017-02-24 DIAGNOSIS — Z794 Long term (current) use of insulin: Secondary | ICD-10-CM | POA: Diagnosis not present

## 2017-02-24 DIAGNOSIS — I890 Lymphedema, not elsewhere classified: Secondary | ICD-10-CM | POA: Diagnosis not present

## 2017-02-24 DIAGNOSIS — M7989 Other specified soft tissue disorders: Secondary | ICD-10-CM | POA: Diagnosis not present

## 2017-02-24 DIAGNOSIS — L97918 Non-pressure chronic ulcer of unspecified part of right lower leg with other specified severity: Secondary | ICD-10-CM | POA: Diagnosis not present

## 2017-02-24 DIAGNOSIS — M353 Polymyalgia rheumatica: Secondary | ICD-10-CM | POA: Diagnosis not present

## 2017-02-24 DIAGNOSIS — H5789 Other specified disorders of eye and adnexa: Secondary | ICD-10-CM | POA: Diagnosis not present

## 2017-02-24 DIAGNOSIS — R39198 Other difficulties with micturition: Secondary | ICD-10-CM | POA: Diagnosis not present

## 2017-02-24 DIAGNOSIS — D61810 Antineoplastic chemotherapy induced pancytopenia: Secondary | ICD-10-CM | POA: Diagnosis not present

## 2017-02-24 DIAGNOSIS — C9201 Acute myeloblastic leukemia, in remission: Secondary | ICD-10-CM | POA: Diagnosis not present

## 2017-02-24 DIAGNOSIS — R600 Localized edema: Secondary | ICD-10-CM | POA: Diagnosis not present

## 2017-02-27 DIAGNOSIS — G20 Parkinson's disease: Secondary | ICD-10-CM | POA: Diagnosis not present

## 2017-02-27 DIAGNOSIS — D696 Thrombocytopenia, unspecified: Secondary | ICD-10-CM | POA: Diagnosis not present

## 2017-02-27 DIAGNOSIS — C8305 Small cell B-cell lymphoma, lymph nodes of inguinal region and lower limb: Secondary | ICD-10-CM | POA: Diagnosis not present

## 2017-02-27 DIAGNOSIS — C9250 Acute myelomonocytic leukemia, not having achieved remission: Secondary | ICD-10-CM | POA: Diagnosis not present

## 2017-02-27 DIAGNOSIS — M353 Polymyalgia rheumatica: Secondary | ICD-10-CM | POA: Diagnosis not present

## 2017-02-27 DIAGNOSIS — E119 Type 2 diabetes mellitus without complications: Secondary | ICD-10-CM | POA: Diagnosis not present

## 2017-03-01 DIAGNOSIS — C8305 Small cell B-cell lymphoma, lymph nodes of inguinal region and lower limb: Secondary | ICD-10-CM | POA: Diagnosis not present

## 2017-03-01 DIAGNOSIS — D696 Thrombocytopenia, unspecified: Secondary | ICD-10-CM | POA: Diagnosis not present

## 2017-03-01 DIAGNOSIS — G20 Parkinson's disease: Secondary | ICD-10-CM | POA: Diagnosis not present

## 2017-03-01 DIAGNOSIS — E119 Type 2 diabetes mellitus without complications: Secondary | ICD-10-CM | POA: Diagnosis not present

## 2017-03-01 DIAGNOSIS — C9250 Acute myelomonocytic leukemia, not having achieved remission: Secondary | ICD-10-CM | POA: Diagnosis not present

## 2017-03-01 DIAGNOSIS — M353 Polymyalgia rheumatica: Secondary | ICD-10-CM | POA: Diagnosis not present

## 2017-03-02 DIAGNOSIS — C9250 Acute myelomonocytic leukemia, not having achieved remission: Secondary | ICD-10-CM | POA: Diagnosis not present

## 2017-03-02 DIAGNOSIS — M353 Polymyalgia rheumatica: Secondary | ICD-10-CM | POA: Diagnosis not present

## 2017-03-02 DIAGNOSIS — G20 Parkinson's disease: Secondary | ICD-10-CM | POA: Diagnosis not present

## 2017-03-02 DIAGNOSIS — E119 Type 2 diabetes mellitus without complications: Secondary | ICD-10-CM | POA: Diagnosis not present

## 2017-03-02 DIAGNOSIS — C8305 Small cell B-cell lymphoma, lymph nodes of inguinal region and lower limb: Secondary | ICD-10-CM | POA: Diagnosis not present

## 2017-03-02 DIAGNOSIS — D696 Thrombocytopenia, unspecified: Secondary | ICD-10-CM | POA: Diagnosis not present

## 2017-03-03 DIAGNOSIS — E039 Hypothyroidism, unspecified: Secondary | ICD-10-CM | POA: Diagnosis not present

## 2017-03-03 DIAGNOSIS — H5789 Other specified disorders of eye and adnexa: Secondary | ICD-10-CM | POA: Diagnosis not present

## 2017-03-03 DIAGNOSIS — I83891 Varicose veins of right lower extremities with other complications: Secondary | ICD-10-CM | POA: Diagnosis not present

## 2017-03-03 DIAGNOSIS — L97919 Non-pressure chronic ulcer of unspecified part of right lower leg with unspecified severity: Secondary | ICD-10-CM | POA: Diagnosis not present

## 2017-03-03 DIAGNOSIS — E119 Type 2 diabetes mellitus without complications: Secondary | ICD-10-CM | POA: Diagnosis not present

## 2017-03-03 DIAGNOSIS — I959 Hypotension, unspecified: Secondary | ICD-10-CM | POA: Diagnosis not present

## 2017-03-03 DIAGNOSIS — C9201 Acute myeloblastic leukemia, in remission: Secondary | ICD-10-CM | POA: Diagnosis not present

## 2017-03-03 DIAGNOSIS — K581 Irritable bowel syndrome with constipation: Secondary | ICD-10-CM | POA: Diagnosis not present

## 2017-03-03 DIAGNOSIS — I83019 Varicose veins of right lower extremity with ulcer of unspecified site: Secondary | ICD-10-CM | POA: Diagnosis not present

## 2017-03-03 DIAGNOSIS — Z79899 Other long term (current) drug therapy: Secondary | ICD-10-CM | POA: Diagnosis not present

## 2017-03-03 DIAGNOSIS — C8305 Small cell B-cell lymphoma, lymph nodes of inguinal region and lower limb: Secondary | ICD-10-CM | POA: Diagnosis not present

## 2017-03-03 DIAGNOSIS — M353 Polymyalgia rheumatica: Secondary | ICD-10-CM | POA: Diagnosis not present

## 2017-03-03 DIAGNOSIS — C9250 Acute myelomonocytic leukemia, not having achieved remission: Secondary | ICD-10-CM | POA: Diagnosis not present

## 2017-03-03 DIAGNOSIS — R600 Localized edema: Secondary | ICD-10-CM | POA: Diagnosis not present

## 2017-03-03 DIAGNOSIS — Z95828 Presence of other vascular implants and grafts: Secondary | ICD-10-CM | POA: Diagnosis not present

## 2017-03-03 DIAGNOSIS — I890 Lymphedema, not elsewhere classified: Secondary | ICD-10-CM | POA: Diagnosis not present

## 2017-03-03 DIAGNOSIS — G20 Parkinson's disease: Secondary | ICD-10-CM | POA: Diagnosis not present

## 2017-03-03 DIAGNOSIS — I872 Venous insufficiency (chronic) (peripheral): Secondary | ICD-10-CM | POA: Diagnosis not present

## 2017-03-03 DIAGNOSIS — D696 Thrombocytopenia, unspecified: Secondary | ICD-10-CM | POA: Diagnosis not present

## 2017-03-03 DIAGNOSIS — Z5181 Encounter for therapeutic drug level monitoring: Secondary | ICD-10-CM | POA: Diagnosis not present

## 2017-03-03 DIAGNOSIS — R609 Edema, unspecified: Secondary | ICD-10-CM | POA: Diagnosis not present

## 2017-03-06 DIAGNOSIS — C8305 Small cell B-cell lymphoma, lymph nodes of inguinal region and lower limb: Secondary | ICD-10-CM | POA: Diagnosis not present

## 2017-03-06 DIAGNOSIS — C9201 Acute myeloblastic leukemia, in remission: Secondary | ICD-10-CM | POA: Diagnosis not present

## 2017-03-06 DIAGNOSIS — I517 Cardiomegaly: Secondary | ICD-10-CM | POA: Diagnosis not present

## 2017-03-06 DIAGNOSIS — G20 Parkinson's disease: Secondary | ICD-10-CM | POA: Diagnosis not present

## 2017-03-06 DIAGNOSIS — E119 Type 2 diabetes mellitus without complications: Secondary | ICD-10-CM | POA: Diagnosis not present

## 2017-03-06 DIAGNOSIS — I272 Pulmonary hypertension, unspecified: Secondary | ICD-10-CM | POA: Diagnosis not present

## 2017-03-06 DIAGNOSIS — D696 Thrombocytopenia, unspecified: Secondary | ICD-10-CM | POA: Diagnosis not present

## 2017-03-06 DIAGNOSIS — I352 Nonrheumatic aortic (valve) stenosis with insufficiency: Secondary | ICD-10-CM | POA: Diagnosis not present

## 2017-03-06 DIAGNOSIS — C9250 Acute myelomonocytic leukemia, not having achieved remission: Secondary | ICD-10-CM | POA: Diagnosis not present

## 2017-03-06 DIAGNOSIS — M353 Polymyalgia rheumatica: Secondary | ICD-10-CM | POA: Diagnosis not present

## 2017-03-06 DIAGNOSIS — T451X5S Adverse effect of antineoplastic and immunosuppressive drugs, sequela: Secondary | ICD-10-CM | POA: Diagnosis not present

## 2017-03-06 DIAGNOSIS — I071 Rheumatic tricuspid insufficiency: Secondary | ICD-10-CM | POA: Diagnosis not present

## 2017-03-10 DIAGNOSIS — G20 Parkinson's disease: Secondary | ICD-10-CM | POA: Diagnosis not present

## 2017-03-10 DIAGNOSIS — E119 Type 2 diabetes mellitus without complications: Secondary | ICD-10-CM | POA: Diagnosis not present

## 2017-03-10 DIAGNOSIS — C9250 Acute myelomonocytic leukemia, not having achieved remission: Secondary | ICD-10-CM | POA: Diagnosis not present

## 2017-03-10 DIAGNOSIS — D696 Thrombocytopenia, unspecified: Secondary | ICD-10-CM | POA: Diagnosis not present

## 2017-03-10 DIAGNOSIS — C8305 Small cell B-cell lymphoma, lymph nodes of inguinal region and lower limb: Secondary | ICD-10-CM | POA: Diagnosis not present

## 2017-03-10 DIAGNOSIS — M353 Polymyalgia rheumatica: Secondary | ICD-10-CM | POA: Diagnosis not present

## 2017-03-13 DIAGNOSIS — Z856 Personal history of leukemia: Secondary | ICD-10-CM | POA: Diagnosis not present

## 2017-03-13 DIAGNOSIS — I2510 Atherosclerotic heart disease of native coronary artery without angina pectoris: Secondary | ICD-10-CM | POA: Diagnosis not present

## 2017-03-13 DIAGNOSIS — E11622 Type 2 diabetes mellitus with other skin ulcer: Secondary | ICD-10-CM | POA: Diagnosis not present

## 2017-03-13 DIAGNOSIS — I83019 Varicose veins of right lower extremity with ulcer of unspecified site: Secondary | ICD-10-CM | POA: Diagnosis not present

## 2017-03-13 DIAGNOSIS — R609 Edema, unspecified: Secondary | ICD-10-CM | POA: Diagnosis not present

## 2017-03-13 DIAGNOSIS — Z9889 Other specified postprocedural states: Secondary | ICD-10-CM | POA: Diagnosis not present

## 2017-03-13 DIAGNOSIS — I890 Lymphedema, not elsewhere classified: Secondary | ICD-10-CM | POA: Diagnosis not present

## 2017-03-13 DIAGNOSIS — S61412D Laceration without foreign body of left hand, subsequent encounter: Secondary | ICD-10-CM | POA: Diagnosis not present

## 2017-03-13 DIAGNOSIS — L97919 Non-pressure chronic ulcer of unspecified part of right lower leg with unspecified severity: Secondary | ICD-10-CM | POA: Diagnosis not present

## 2017-03-13 DIAGNOSIS — S51012D Laceration without foreign body of left elbow, subsequent encounter: Secondary | ICD-10-CM | POA: Diagnosis not present

## 2017-03-13 DIAGNOSIS — E039 Hypothyroidism, unspecified: Secondary | ICD-10-CM | POA: Diagnosis not present

## 2017-03-13 DIAGNOSIS — I83891 Varicose veins of right lower extremities with other complications: Secondary | ICD-10-CM | POA: Diagnosis not present

## 2017-03-13 DIAGNOSIS — I10 Essential (primary) hypertension: Secondary | ICD-10-CM | POA: Diagnosis not present

## 2017-03-13 DIAGNOSIS — R7309 Other abnormal glucose: Secondary | ICD-10-CM | POA: Diagnosis not present

## 2017-03-14 DIAGNOSIS — M353 Polymyalgia rheumatica: Secondary | ICD-10-CM | POA: Diagnosis not present

## 2017-03-14 DIAGNOSIS — C8305 Small cell B-cell lymphoma, lymph nodes of inguinal region and lower limb: Secondary | ICD-10-CM | POA: Diagnosis not present

## 2017-03-14 DIAGNOSIS — E119 Type 2 diabetes mellitus without complications: Secondary | ICD-10-CM | POA: Diagnosis not present

## 2017-03-14 DIAGNOSIS — C9250 Acute myelomonocytic leukemia, not having achieved remission: Secondary | ICD-10-CM | POA: Diagnosis not present

## 2017-03-14 DIAGNOSIS — G20 Parkinson's disease: Secondary | ICD-10-CM | POA: Diagnosis not present

## 2017-03-14 DIAGNOSIS — D696 Thrombocytopenia, unspecified: Secondary | ICD-10-CM | POA: Diagnosis not present

## 2017-03-15 DIAGNOSIS — C9250 Acute myelomonocytic leukemia, not having achieved remission: Secondary | ICD-10-CM | POA: Diagnosis not present

## 2017-03-15 DIAGNOSIS — G20 Parkinson's disease: Secondary | ICD-10-CM | POA: Diagnosis not present

## 2017-03-15 DIAGNOSIS — C8305 Small cell B-cell lymphoma, lymph nodes of inguinal region and lower limb: Secondary | ICD-10-CM | POA: Diagnosis not present

## 2017-03-15 DIAGNOSIS — M353 Polymyalgia rheumatica: Secondary | ICD-10-CM | POA: Diagnosis not present

## 2017-03-15 DIAGNOSIS — E119 Type 2 diabetes mellitus without complications: Secondary | ICD-10-CM | POA: Diagnosis not present

## 2017-03-15 DIAGNOSIS — D696 Thrombocytopenia, unspecified: Secondary | ICD-10-CM | POA: Diagnosis not present

## 2017-03-16 DIAGNOSIS — R531 Weakness: Secondary | ICD-10-CM | POA: Diagnosis not present

## 2017-03-17 DIAGNOSIS — D696 Thrombocytopenia, unspecified: Secondary | ICD-10-CM | POA: Diagnosis not present

## 2017-03-17 DIAGNOSIS — M353 Polymyalgia rheumatica: Secondary | ICD-10-CM | POA: Diagnosis not present

## 2017-03-17 DIAGNOSIS — E119 Type 2 diabetes mellitus without complications: Secondary | ICD-10-CM | POA: Diagnosis not present

## 2017-03-17 DIAGNOSIS — G20 Parkinson's disease: Secondary | ICD-10-CM | POA: Diagnosis not present

## 2017-03-17 DIAGNOSIS — C8305 Small cell B-cell lymphoma, lymph nodes of inguinal region and lower limb: Secondary | ICD-10-CM | POA: Diagnosis not present

## 2017-03-17 DIAGNOSIS — C9250 Acute myelomonocytic leukemia, not having achieved remission: Secondary | ICD-10-CM | POA: Diagnosis not present

## 2017-03-18 DIAGNOSIS — G20 Parkinson's disease: Secondary | ICD-10-CM | POA: Diagnosis not present

## 2017-03-18 DIAGNOSIS — R300 Dysuria: Secondary | ICD-10-CM | POA: Diagnosis not present

## 2017-03-18 DIAGNOSIS — L03113 Cellulitis of right upper limb: Secondary | ICD-10-CM | POA: Diagnosis not present

## 2017-03-18 DIAGNOSIS — I872 Venous insufficiency (chronic) (peripheral): Secondary | ICD-10-CM | POA: Diagnosis not present

## 2017-03-18 DIAGNOSIS — R509 Fever, unspecified: Secondary | ICD-10-CM | POA: Diagnosis not present

## 2017-03-18 DIAGNOSIS — S51019A Laceration without foreign body of unspecified elbow, initial encounter: Secondary | ICD-10-CM | POA: Diagnosis not present

## 2017-03-18 DIAGNOSIS — I5030 Unspecified diastolic (congestive) heart failure: Secondary | ICD-10-CM | POA: Diagnosis not present

## 2017-03-18 DIAGNOSIS — C9201 Acute myeloblastic leukemia, in remission: Secondary | ICD-10-CM | POA: Diagnosis not present

## 2017-03-18 DIAGNOSIS — R5383 Other fatigue: Secondary | ICD-10-CM | POA: Diagnosis not present

## 2017-03-18 DIAGNOSIS — M25531 Pain in right wrist: Secondary | ICD-10-CM | POA: Diagnosis not present

## 2017-03-18 DIAGNOSIS — M79641 Pain in right hand: Secondary | ICD-10-CM | POA: Diagnosis not present

## 2017-03-18 DIAGNOSIS — I5032 Chronic diastolic (congestive) heart failure: Secondary | ICD-10-CM | POA: Diagnosis not present

## 2017-03-18 DIAGNOSIS — M19031 Primary osteoarthritis, right wrist: Secondary | ICD-10-CM | POA: Diagnosis not present

## 2017-03-18 DIAGNOSIS — R4701 Aphasia: Secondary | ICD-10-CM | POA: Diagnosis not present

## 2017-03-18 DIAGNOSIS — I83018 Varicose veins of right lower extremity with ulcer other part of lower leg: Secondary | ICD-10-CM | POA: Diagnosis not present

## 2017-03-18 DIAGNOSIS — R350 Frequency of micturition: Secondary | ICD-10-CM | POA: Diagnosis not present

## 2017-03-18 DIAGNOSIS — M8580 Other specified disorders of bone density and structure, unspecified site: Secondary | ICD-10-CM | POA: Diagnosis not present

## 2017-03-18 DIAGNOSIS — M353 Polymyalgia rheumatica: Secondary | ICD-10-CM | POA: Diagnosis not present

## 2017-03-18 DIAGNOSIS — X58XXXA Exposure to other specified factors, initial encounter: Secondary | ICD-10-CM | POA: Diagnosis not present

## 2017-03-18 DIAGNOSIS — D693 Immune thrombocytopenic purpura: Secondary | ICD-10-CM | POA: Diagnosis not present

## 2017-03-18 DIAGNOSIS — E039 Hypothyroidism, unspecified: Secondary | ICD-10-CM | POA: Diagnosis not present

## 2017-03-18 DIAGNOSIS — Z794 Long term (current) use of insulin: Secondary | ICD-10-CM | POA: Diagnosis not present

## 2017-03-18 DIAGNOSIS — R2231 Localized swelling, mass and lump, right upper limb: Secondary | ICD-10-CM | POA: Diagnosis not present

## 2017-03-18 DIAGNOSIS — I998 Other disorder of circulatory system: Secondary | ICD-10-CM | POA: Diagnosis not present

## 2017-03-18 DIAGNOSIS — M1811 Unilateral primary osteoarthritis of first carpometacarpal joint, right hand: Secondary | ICD-10-CM | POA: Diagnosis not present

## 2017-03-18 DIAGNOSIS — M19041 Primary osteoarthritis, right hand: Secondary | ICD-10-CM | POA: Diagnosis not present

## 2017-03-18 DIAGNOSIS — E119 Type 2 diabetes mellitus without complications: Secondary | ICD-10-CM | POA: Diagnosis not present

## 2017-03-18 DIAGNOSIS — Y998 Other external cause status: Secondary | ICD-10-CM | POA: Diagnosis not present

## 2017-03-18 DIAGNOSIS — M25831 Other specified joint disorders, right wrist: Secondary | ICD-10-CM | POA: Diagnosis not present

## 2017-03-18 DIAGNOSIS — R531 Weakness: Secondary | ICD-10-CM | POA: Diagnosis not present

## 2017-03-18 DIAGNOSIS — R600 Localized edema: Secondary | ICD-10-CM | POA: Diagnosis not present

## 2017-03-19 DIAGNOSIS — M353 Polymyalgia rheumatica: Secondary | ICD-10-CM | POA: Diagnosis present

## 2017-03-19 DIAGNOSIS — R531 Weakness: Secondary | ICD-10-CM | POA: Diagnosis not present

## 2017-03-19 DIAGNOSIS — R52 Pain, unspecified: Secondary | ICD-10-CM | POA: Diagnosis not present

## 2017-03-19 DIAGNOSIS — H04129 Dry eye syndrome of unspecified lacrimal gland: Secondary | ICD-10-CM | POA: Diagnosis not present

## 2017-03-19 DIAGNOSIS — Z955 Presence of coronary angioplasty implant and graft: Secondary | ICD-10-CM | POA: Diagnosis not present

## 2017-03-19 DIAGNOSIS — I83028 Varicose veins of left lower extremity with ulcer other part of lower leg: Secondary | ICD-10-CM | POA: Diagnosis not present

## 2017-03-19 DIAGNOSIS — R339 Retention of urine, unspecified: Secondary | ICD-10-CM | POA: Diagnosis not present

## 2017-03-19 DIAGNOSIS — M19039 Primary osteoarthritis, unspecified wrist: Secondary | ICD-10-CM | POA: Diagnosis not present

## 2017-03-19 DIAGNOSIS — Z833 Family history of diabetes mellitus: Secondary | ICD-10-CM | POA: Diagnosis not present

## 2017-03-19 DIAGNOSIS — Z885 Allergy status to narcotic agent status: Secondary | ICD-10-CM | POA: Diagnosis not present

## 2017-03-19 DIAGNOSIS — Z8249 Family history of ischemic heart disease and other diseases of the circulatory system: Secondary | ICD-10-CM | POA: Diagnosis not present

## 2017-03-19 DIAGNOSIS — I110 Hypertensive heart disease with heart failure: Secondary | ICD-10-CM | POA: Diagnosis present

## 2017-03-19 DIAGNOSIS — S81801A Unspecified open wound, right lower leg, initial encounter: Secondary | ICD-10-CM | POA: Diagnosis not present

## 2017-03-19 DIAGNOSIS — Z7409 Other reduced mobility: Secondary | ICD-10-CM | POA: Diagnosis not present

## 2017-03-19 DIAGNOSIS — E039 Hypothyroidism, unspecified: Secondary | ICD-10-CM | POA: Diagnosis present

## 2017-03-19 DIAGNOSIS — C9201 Acute myeloblastic leukemia, in remission: Secondary | ICD-10-CM | POA: Diagnosis present

## 2017-03-19 DIAGNOSIS — E876 Hypokalemia: Secondary | ICD-10-CM | POA: Diagnosis not present

## 2017-03-19 DIAGNOSIS — I5032 Chronic diastolic (congestive) heart failure: Secondary | ICD-10-CM | POA: Diagnosis present

## 2017-03-19 DIAGNOSIS — I2510 Atherosclerotic heart disease of native coronary artery without angina pectoris: Secondary | ICD-10-CM | POA: Diagnosis present

## 2017-03-19 DIAGNOSIS — E569 Vitamin deficiency, unspecified: Secondary | ICD-10-CM | POA: Diagnosis not present

## 2017-03-19 DIAGNOSIS — K589 Irritable bowel syndrome without diarrhea: Secondary | ICD-10-CM | POA: Diagnosis not present

## 2017-03-19 DIAGNOSIS — S81802A Unspecified open wound, left lower leg, initial encounter: Secondary | ICD-10-CM | POA: Diagnosis not present

## 2017-03-19 DIAGNOSIS — F329 Major depressive disorder, single episode, unspecified: Secondary | ICD-10-CM | POA: Diagnosis present

## 2017-03-19 DIAGNOSIS — F419 Anxiety disorder, unspecified: Secondary | ICD-10-CM | POA: Diagnosis not present

## 2017-03-19 DIAGNOSIS — I83018 Varicose veins of right lower extremity with ulcer other part of lower leg: Secondary | ICD-10-CM | POA: Diagnosis not present

## 2017-03-19 DIAGNOSIS — L03113 Cellulitis of right upper limb: Secondary | ICD-10-CM | POA: Diagnosis present

## 2017-03-19 DIAGNOSIS — R509 Fever, unspecified: Secondary | ICD-10-CM | POA: Diagnosis not present

## 2017-03-19 DIAGNOSIS — Z111 Encounter for screening for respiratory tuberculosis: Secondary | ICD-10-CM | POA: Diagnosis not present

## 2017-03-19 DIAGNOSIS — R5382 Chronic fatigue, unspecified: Secondary | ICD-10-CM | POA: Diagnosis not present

## 2017-03-19 DIAGNOSIS — R482 Apraxia: Secondary | ICD-10-CM | POA: Diagnosis not present

## 2017-03-19 DIAGNOSIS — Z66 Do not resuscitate: Secondary | ICD-10-CM | POA: Diagnosis present

## 2017-03-19 DIAGNOSIS — Z794 Long term (current) use of insulin: Secondary | ICD-10-CM | POA: Diagnosis not present

## 2017-03-19 DIAGNOSIS — M6281 Muscle weakness (generalized): Secondary | ICD-10-CM | POA: Diagnosis not present

## 2017-03-19 DIAGNOSIS — K5900 Constipation, unspecified: Secondary | ICD-10-CM | POA: Diagnosis not present

## 2017-03-19 DIAGNOSIS — I872 Venous insufficiency (chronic) (peripheral): Secondary | ICD-10-CM | POA: Diagnosis present

## 2017-03-19 DIAGNOSIS — Z23 Encounter for immunization: Secondary | ICD-10-CM | POA: Diagnosis not present

## 2017-03-19 DIAGNOSIS — F5101 Primary insomnia: Secondary | ICD-10-CM | POA: Diagnosis not present

## 2017-03-19 DIAGNOSIS — E099 Drug or chemical induced diabetes mellitus without complications: Secondary | ICD-10-CM | POA: Diagnosis present

## 2017-03-19 DIAGNOSIS — C9290 Myeloid leukemia, unspecified, not having achieved remission: Secondary | ICD-10-CM | POA: Diagnosis not present

## 2017-03-19 DIAGNOSIS — R4701 Aphasia: Secondary | ICD-10-CM | POA: Diagnosis present

## 2017-03-19 DIAGNOSIS — G20 Parkinson's disease: Secondary | ICD-10-CM | POA: Diagnosis present

## 2017-03-19 DIAGNOSIS — M25831 Other specified joint disorders, right wrist: Secondary | ICD-10-CM | POA: Diagnosis present

## 2017-03-19 DIAGNOSIS — D696 Thrombocytopenia, unspecified: Secondary | ICD-10-CM | POA: Diagnosis not present

## 2017-03-19 DIAGNOSIS — L03818 Cellulitis of other sites: Secondary | ICD-10-CM | POA: Diagnosis not present

## 2017-03-19 DIAGNOSIS — D693 Immune thrombocytopenic purpura: Secondary | ICD-10-CM | POA: Diagnosis present

## 2017-03-19 DIAGNOSIS — C9200 Acute myeloblastic leukemia, not having achieved remission: Secondary | ICD-10-CM | POA: Diagnosis not present

## 2017-03-19 DIAGNOSIS — L97821 Non-pressure chronic ulcer of other part of left lower leg limited to breakdown of skin: Secondary | ICD-10-CM | POA: Diagnosis not present

## 2017-03-19 DIAGNOSIS — R110 Nausea: Secondary | ICD-10-CM | POA: Diagnosis not present

## 2017-03-19 DIAGNOSIS — Z7952 Long term (current) use of systemic steroids: Secondary | ICD-10-CM | POA: Diagnosis not present

## 2017-03-19 DIAGNOSIS — L97811 Non-pressure chronic ulcer of other part of right lower leg limited to breakdown of skin: Secondary | ICD-10-CM | POA: Diagnosis not present

## 2017-03-19 DIAGNOSIS — Z881 Allergy status to other antibiotic agents status: Secondary | ICD-10-CM | POA: Diagnosis not present

## 2017-03-19 DIAGNOSIS — N139 Obstructive and reflux uropathy, unspecified: Secondary | ICD-10-CM | POA: Diagnosis not present

## 2017-03-19 DIAGNOSIS — Z9221 Personal history of antineoplastic chemotherapy: Secondary | ICD-10-CM | POA: Diagnosis not present

## 2017-03-19 DIAGNOSIS — E785 Hyperlipidemia, unspecified: Secondary | ICD-10-CM | POA: Diagnosis not present

## 2017-03-19 DIAGNOSIS — G4700 Insomnia, unspecified: Secondary | ICD-10-CM | POA: Diagnosis present

## 2017-03-19 DIAGNOSIS — M25531 Pain in right wrist: Secondary | ICD-10-CM | POA: Diagnosis not present

## 2017-03-19 DIAGNOSIS — E119 Type 2 diabetes mellitus without complications: Secondary | ICD-10-CM | POA: Diagnosis not present

## 2017-03-23 DIAGNOSIS — C9200 Acute myeloblastic leukemia, not having achieved remission: Secondary | ICD-10-CM | POA: Diagnosis not present

## 2017-03-23 DIAGNOSIS — N390 Urinary tract infection, site not specified: Secondary | ICD-10-CM | POA: Diagnosis not present

## 2017-03-23 DIAGNOSIS — S81801A Unspecified open wound, right lower leg, initial encounter: Secondary | ICD-10-CM | POA: Diagnosis not present

## 2017-03-23 DIAGNOSIS — J45909 Unspecified asthma, uncomplicated: Secondary | ICD-10-CM | POA: Diagnosis not present

## 2017-03-23 DIAGNOSIS — I2510 Atherosclerotic heart disease of native coronary artery without angina pectoris: Secondary | ICD-10-CM | POA: Diagnosis not present

## 2017-03-23 DIAGNOSIS — N4889 Other specified disorders of penis: Secondary | ICD-10-CM | POA: Diagnosis not present

## 2017-03-23 DIAGNOSIS — R102 Pelvic and perineal pain: Secondary | ICD-10-CM | POA: Diagnosis not present

## 2017-03-23 DIAGNOSIS — M6281 Muscle weakness (generalized): Secondary | ICD-10-CM | POA: Diagnosis not present

## 2017-03-23 DIAGNOSIS — R1084 Generalized abdominal pain: Secondary | ICD-10-CM | POA: Diagnosis not present

## 2017-03-23 DIAGNOSIS — Z66 Do not resuscitate: Secondary | ICD-10-CM | POA: Diagnosis not present

## 2017-03-23 DIAGNOSIS — K5900 Constipation, unspecified: Secondary | ICD-10-CM | POA: Diagnosis not present

## 2017-03-23 DIAGNOSIS — M25531 Pain in right wrist: Secondary | ICD-10-CM | POA: Diagnosis not present

## 2017-03-23 DIAGNOSIS — S81802A Unspecified open wound, left lower leg, initial encounter: Secondary | ICD-10-CM | POA: Diagnosis not present

## 2017-03-23 DIAGNOSIS — K589 Irritable bowel syndrome without diarrhea: Secondary | ICD-10-CM | POA: Diagnosis not present

## 2017-03-23 DIAGNOSIS — E569 Vitamin deficiency, unspecified: Secondary | ICD-10-CM | POA: Diagnosis not present

## 2017-03-23 DIAGNOSIS — Z79899 Other long term (current) drug therapy: Secondary | ICD-10-CM | POA: Diagnosis not present

## 2017-03-23 DIAGNOSIS — Z794 Long term (current) use of insulin: Secondary | ICD-10-CM | POA: Diagnosis not present

## 2017-03-23 DIAGNOSIS — E039 Hypothyroidism, unspecified: Secondary | ICD-10-CM | POA: Diagnosis not present

## 2017-03-23 DIAGNOSIS — H04129 Dry eye syndrome of unspecified lacrimal gland: Secondary | ICD-10-CM | POA: Diagnosis not present

## 2017-03-23 DIAGNOSIS — R339 Retention of urine, unspecified: Secondary | ICD-10-CM | POA: Diagnosis not present

## 2017-03-23 DIAGNOSIS — E785 Hyperlipidemia, unspecified: Secondary | ICD-10-CM | POA: Diagnosis not present

## 2017-03-23 DIAGNOSIS — R100 Acute abdomen: Secondary | ICD-10-CM | POA: Diagnosis not present

## 2017-03-23 DIAGNOSIS — R531 Weakness: Secondary | ICD-10-CM | POA: Diagnosis not present

## 2017-03-23 DIAGNOSIS — M79641 Pain in right hand: Secondary | ICD-10-CM | POA: Diagnosis not present

## 2017-03-23 DIAGNOSIS — R4182 Altered mental status, unspecified: Secondary | ICD-10-CM | POA: Diagnosis not present

## 2017-03-23 DIAGNOSIS — R109 Unspecified abdominal pain: Secondary | ICD-10-CM | POA: Diagnosis present

## 2017-03-23 DIAGNOSIS — F5101 Primary insomnia: Secondary | ICD-10-CM | POA: Diagnosis not present

## 2017-03-23 DIAGNOSIS — Z111 Encounter for screening for respiratory tuberculosis: Secondary | ICD-10-CM | POA: Diagnosis not present

## 2017-03-23 DIAGNOSIS — M19039 Primary osteoarthritis, unspecified wrist: Secondary | ICD-10-CM | POA: Diagnosis not present

## 2017-03-23 DIAGNOSIS — R5382 Chronic fatigue, unspecified: Secondary | ICD-10-CM | POA: Diagnosis not present

## 2017-03-23 DIAGNOSIS — F329 Major depressive disorder, single episode, unspecified: Secondary | ICD-10-CM | POA: Diagnosis not present

## 2017-03-23 DIAGNOSIS — F419 Anxiety disorder, unspecified: Secondary | ICD-10-CM | POA: Diagnosis not present

## 2017-03-23 DIAGNOSIS — R079 Chest pain, unspecified: Secondary | ICD-10-CM | POA: Diagnosis not present

## 2017-03-23 DIAGNOSIS — R52 Pain, unspecified: Secondary | ICD-10-CM | POA: Diagnosis not present

## 2017-03-23 DIAGNOSIS — B356 Tinea cruris: Secondary | ICD-10-CM | POA: Diagnosis not present

## 2017-03-23 DIAGNOSIS — L03818 Cellulitis of other sites: Secondary | ICD-10-CM | POA: Diagnosis not present

## 2017-03-23 DIAGNOSIS — R300 Dysuria: Secondary | ICD-10-CM | POA: Diagnosis not present

## 2017-03-23 DIAGNOSIS — Z8673 Personal history of transient ischemic attack (TIA), and cerebral infarction without residual deficits: Secondary | ICD-10-CM | POA: Diagnosis not present

## 2017-03-23 DIAGNOSIS — E119 Type 2 diabetes mellitus without complications: Secondary | ICD-10-CM | POA: Diagnosis not present

## 2017-03-23 DIAGNOSIS — R110 Nausea: Secondary | ICD-10-CM | POA: Diagnosis not present

## 2017-03-23 DIAGNOSIS — L03113 Cellulitis of right upper limb: Secondary | ICD-10-CM | POA: Diagnosis not present

## 2017-03-23 DIAGNOSIS — M353 Polymyalgia rheumatica: Secondary | ICD-10-CM | POA: Diagnosis not present

## 2017-03-23 DIAGNOSIS — E099 Drug or chemical induced diabetes mellitus without complications: Secondary | ICD-10-CM | POA: Diagnosis not present

## 2017-03-23 DIAGNOSIS — I10 Essential (primary) hypertension: Secondary | ICD-10-CM | POA: Diagnosis not present

## 2017-03-23 DIAGNOSIS — D696 Thrombocytopenia, unspecified: Secondary | ICD-10-CM | POA: Diagnosis not present

## 2017-03-23 DIAGNOSIS — R482 Apraxia: Secondary | ICD-10-CM | POA: Diagnosis not present

## 2017-03-23 DIAGNOSIS — Z23 Encounter for immunization: Secondary | ICD-10-CM | POA: Diagnosis not present

## 2017-03-23 DIAGNOSIS — C9290 Myeloid leukemia, unspecified, not having achieved remission: Secondary | ICD-10-CM | POA: Diagnosis not present

## 2017-03-23 DIAGNOSIS — Z955 Presence of coronary angioplasty implant and graft: Secondary | ICD-10-CM | POA: Diagnosis not present

## 2017-03-23 DIAGNOSIS — Z7409 Other reduced mobility: Secondary | ICD-10-CM | POA: Diagnosis not present

## 2017-03-23 DIAGNOSIS — I259 Chronic ischemic heart disease, unspecified: Secondary | ICD-10-CM | POA: Diagnosis not present

## 2017-03-23 DIAGNOSIS — N139 Obstructive and reflux uropathy, unspecified: Secondary | ICD-10-CM | POA: Diagnosis not present

## 2017-03-23 DIAGNOSIS — I83018 Varicose veins of right lower extremity with ulcer other part of lower leg: Secondary | ICD-10-CM | POA: Diagnosis not present

## 2017-03-23 DIAGNOSIS — G20 Parkinson's disease: Secondary | ICD-10-CM | POA: Diagnosis not present

## 2017-03-24 ENCOUNTER — Other Ambulatory Visit: Payer: Self-pay | Admitting: Nurse Practitioner

## 2017-03-24 DIAGNOSIS — M6281 Muscle weakness (generalized): Secondary | ICD-10-CM | POA: Diagnosis not present

## 2017-03-24 DIAGNOSIS — K589 Irritable bowel syndrome without diarrhea: Secondary | ICD-10-CM | POA: Diagnosis not present

## 2017-03-24 DIAGNOSIS — M353 Polymyalgia rheumatica: Secondary | ICD-10-CM | POA: Diagnosis not present

## 2017-03-24 DIAGNOSIS — R52 Pain, unspecified: Secondary | ICD-10-CM | POA: Diagnosis not present

## 2017-03-24 DIAGNOSIS — E039 Hypothyroidism, unspecified: Secondary | ICD-10-CM | POA: Diagnosis not present

## 2017-03-24 DIAGNOSIS — E119 Type 2 diabetes mellitus without complications: Secondary | ICD-10-CM | POA: Diagnosis not present

## 2017-03-24 DIAGNOSIS — R079 Chest pain, unspecified: Secondary | ICD-10-CM | POA: Diagnosis not present

## 2017-03-24 DIAGNOSIS — E785 Hyperlipidemia, unspecified: Secondary | ICD-10-CM | POA: Diagnosis not present

## 2017-03-24 DIAGNOSIS — G20 Parkinson's disease: Secondary | ICD-10-CM | POA: Diagnosis not present

## 2017-03-24 DIAGNOSIS — D696 Thrombocytopenia, unspecified: Secondary | ICD-10-CM | POA: Diagnosis not present

## 2017-03-24 DIAGNOSIS — L03818 Cellulitis of other sites: Secondary | ICD-10-CM | POA: Diagnosis not present

## 2017-03-24 DIAGNOSIS — R110 Nausea: Secondary | ICD-10-CM | POA: Diagnosis not present

## 2017-03-27 DIAGNOSIS — R110 Nausea: Secondary | ICD-10-CM | POA: Diagnosis not present

## 2017-03-27 DIAGNOSIS — L03818 Cellulitis of other sites: Secondary | ICD-10-CM | POA: Diagnosis not present

## 2017-03-27 DIAGNOSIS — K589 Irritable bowel syndrome without diarrhea: Secondary | ICD-10-CM | POA: Diagnosis not present

## 2017-03-27 DIAGNOSIS — E119 Type 2 diabetes mellitus without complications: Secondary | ICD-10-CM | POA: Diagnosis not present

## 2017-03-27 DIAGNOSIS — D696 Thrombocytopenia, unspecified: Secondary | ICD-10-CM | POA: Diagnosis not present

## 2017-03-27 DIAGNOSIS — R52 Pain, unspecified: Secondary | ICD-10-CM | POA: Diagnosis not present

## 2017-03-27 DIAGNOSIS — R079 Chest pain, unspecified: Secondary | ICD-10-CM | POA: Diagnosis not present

## 2017-03-27 DIAGNOSIS — M353 Polymyalgia rheumatica: Secondary | ICD-10-CM | POA: Diagnosis not present

## 2017-03-27 DIAGNOSIS — M6281 Muscle weakness (generalized): Secondary | ICD-10-CM | POA: Diagnosis not present

## 2017-03-27 DIAGNOSIS — E785 Hyperlipidemia, unspecified: Secondary | ICD-10-CM | POA: Diagnosis not present

## 2017-03-27 DIAGNOSIS — E039 Hypothyroidism, unspecified: Secondary | ICD-10-CM | POA: Diagnosis not present

## 2017-03-27 DIAGNOSIS — H04129 Dry eye syndrome of unspecified lacrimal gland: Secondary | ICD-10-CM | POA: Diagnosis not present

## 2017-03-31 DIAGNOSIS — L03818 Cellulitis of other sites: Secondary | ICD-10-CM | POA: Diagnosis not present

## 2017-03-31 DIAGNOSIS — M6281 Muscle weakness (generalized): Secondary | ICD-10-CM | POA: Diagnosis not present

## 2017-03-31 DIAGNOSIS — M353 Polymyalgia rheumatica: Secondary | ICD-10-CM | POA: Diagnosis not present

## 2017-03-31 DIAGNOSIS — E039 Hypothyroidism, unspecified: Secondary | ICD-10-CM | POA: Diagnosis not present

## 2017-03-31 DIAGNOSIS — E119 Type 2 diabetes mellitus without complications: Secondary | ICD-10-CM | POA: Diagnosis not present

## 2017-03-31 DIAGNOSIS — R110 Nausea: Secondary | ICD-10-CM | POA: Diagnosis not present

## 2017-03-31 DIAGNOSIS — R079 Chest pain, unspecified: Secondary | ICD-10-CM | POA: Diagnosis not present

## 2017-03-31 DIAGNOSIS — G20 Parkinson's disease: Secondary | ICD-10-CM | POA: Diagnosis not present

## 2017-03-31 DIAGNOSIS — K589 Irritable bowel syndrome without diarrhea: Secondary | ICD-10-CM | POA: Diagnosis not present

## 2017-03-31 DIAGNOSIS — R52 Pain, unspecified: Secondary | ICD-10-CM | POA: Diagnosis not present

## 2017-03-31 DIAGNOSIS — E785 Hyperlipidemia, unspecified: Secondary | ICD-10-CM | POA: Diagnosis not present

## 2017-03-31 DIAGNOSIS — D696 Thrombocytopenia, unspecified: Secondary | ICD-10-CM | POA: Diagnosis not present

## 2017-04-03 DIAGNOSIS — M79641 Pain in right hand: Secondary | ICD-10-CM | POA: Diagnosis not present

## 2017-04-03 DIAGNOSIS — B356 Tinea cruris: Secondary | ICD-10-CM | POA: Diagnosis not present

## 2017-04-06 ENCOUNTER — Emergency Department (HOSPITAL_COMMUNITY)
Admission: EM | Admit: 2017-04-06 | Discharge: 2017-04-06 | Disposition: A | Discharge status: Skilled Nursing Facility | Payer: Medicare Other | Attending: Emergency Medicine | Admitting: Emergency Medicine

## 2017-04-06 ENCOUNTER — Encounter (HOSPITAL_COMMUNITY): Payer: Self-pay

## 2017-04-06 DIAGNOSIS — Z79899 Other long term (current) drug therapy: Secondary | ICD-10-CM | POA: Insufficient documentation

## 2017-04-06 DIAGNOSIS — C9200 Acute myeloblastic leukemia, not having achieved remission: Secondary | ICD-10-CM | POA: Diagnosis not present

## 2017-04-06 DIAGNOSIS — R339 Retention of urine, unspecified: Secondary | ICD-10-CM | POA: Diagnosis not present

## 2017-04-06 DIAGNOSIS — E039 Hypothyroidism, unspecified: Secondary | ICD-10-CM | POA: Diagnosis not present

## 2017-04-06 DIAGNOSIS — I259 Chronic ischemic heart disease, unspecified: Secondary | ICD-10-CM | POA: Insufficient documentation

## 2017-04-06 DIAGNOSIS — I10 Essential (primary) hypertension: Secondary | ICD-10-CM | POA: Diagnosis not present

## 2017-04-06 DIAGNOSIS — E119 Type 2 diabetes mellitus without complications: Secondary | ICD-10-CM | POA: Insufficient documentation

## 2017-04-06 DIAGNOSIS — Z794 Long term (current) use of insulin: Secondary | ICD-10-CM | POA: Insufficient documentation

## 2017-04-06 DIAGNOSIS — Z955 Presence of coronary angioplasty implant and graft: Secondary | ICD-10-CM | POA: Insufficient documentation

## 2017-04-06 LAB — URINALYSIS, ROUTINE W REFLEX MICROSCOPIC
Bilirubin Urine: NEGATIVE
Glucose, UA: NEGATIVE mg/dL
Ketones, ur: NEGATIVE mg/dL
NITRITE: NEGATIVE
PH: 5 (ref 5.0–8.0)
Protein, ur: NEGATIVE mg/dL
SPECIFIC GRAVITY, URINE: 1.014 (ref 1.005–1.030)

## 2017-04-06 LAB — I-STAT CHEM 8, ED
BUN: 16 mg/dL (ref 6–20)
CHLORIDE: 98 mmol/L — AB (ref 101–111)
CREATININE: 0.7 mg/dL (ref 0.61–1.24)
Calcium, Ion: 1.13 mmol/L — ABNORMAL LOW (ref 1.15–1.40)
Glucose, Bld: 122 mg/dL — ABNORMAL HIGH (ref 65–99)
HEMATOCRIT: 39 % (ref 39.0–52.0)
Hemoglobin: 13.3 g/dL (ref 13.0–17.0)
POTASSIUM: 3.4 mmol/L — AB (ref 3.5–5.1)
Sodium: 141 mmol/L (ref 135–145)
TCO2: 30 mmol/L (ref 22–32)

## 2017-04-06 MED ORDER — CIPROFLOXACIN HCL 500 MG PO TABS
500.0000 mg | ORAL_TABLET | Freq: Once | ORAL | Status: AC
  Administered 2017-04-06: 500 mg via ORAL
  Filled 2017-04-06: qty 1

## 2017-04-06 MED ORDER — CIPROFLOXACIN HCL 500 MG PO TABS: 500.0000 mg | ORAL_TABLET | Freq: Two times a day (BID) | ORAL | 0 refills | Status: DC

## 2017-04-06 NOTE — ED Provider Notes (Signed)
[facility] DEPT Provider Note   CSN: [id number] Arrival date & time: 04/06/17  0110     History   Chief Complaint Chief Complaint  Patient presents with  . Urinary Retention    HPI Nathan Kemp is a 76 y.o. male.  Patient presents to the emergency department with urinary retention.  He states that he has a chronic indwelling urinary catheter.  He reports decreased output over the past day.  He reports increased abdominal pain and distention.  Denies any fevers, chills, nausea, or vomiting.  Reports that his catheter was last changed approximately 3 weeks ago.  He denies any other associated symptoms.   The history is provided by the patient. No language interpreter was used.    Past Medical History:  Diagnosis Date  . Acute GI bleeding 04/16/2011  . Anemia   . Anorexia   . Asthma    when  have congstion  of chest  . Blood dyscrasia 06/2009   thrombocytopenia  . Blood transfusion    platelets 12/12  . 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 Coronary artery disease    stent 06  . Cough   . DDD (degenerative disc disease), cervical   . Diabetes mellitus    borderline-controlled by diet  . ED (erectile dysfunction)   . Essential hypertension   . Fatigue   . GERD (gastroesophageal reflux disease)   . Heart murmur   . Hyperlipidemia   . Hypertension   . Hypothyroidism   . IBS (irritable bowel syndrome)   . Leg swelling    due to lymphedema  . Lymphedema: RLE 02/19/2015  . Obesity   . PMR (polymyalgia rheumatica) ([place]) 02/19/2015  . Pneumonia   . Polymyalgia ([place])   . Thrombocytopenia ([place])   . Thyroid disease   . Trouble swallowing   . Vitamin D deficiency     Patient Active Problem List   Diagnosis Date Noted  . 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 Leukocytosis 02/19/2015  . Hepatosplenomegaly 02/19/2015  . Lymphedema: RLE 02/19/2015  . PMR (polymyalgia rheumatica) (HCC) 02/19/2015  . 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 GERD (gastroesophageal reflux disease) 10/28/2014  . Diabetes mellitus without complication ([place]) 01/08/3006  . History of GI bleed 10/28/2014  . Vertigo 10/28/2014  . Diarrhea 10/28/2014  . Essential hypertension   . Stroke with cerebral ischemia ([place])   . Acute GI bleeding 04/16/2011  . Skin cancer 04/15/2011  . Thrombocytopenia ([place]) 03/28/2011  . Lymphadenopathy, neck 03/08/2011  . CAD (coronary artery disease) 01/13/2011  . Hypothyroidism 03/29/2010  . SPLENOMEGALY 03/29/2010  . HEPATOMEGALY, HX OF 03/29/2010  . FEVER, HX OF 03/29/2010  . HLD (hyperlipidemia) 09/07/2007  . HYPERTENSION 09/07/2007  . COUGH 09/07/2007    Past Surgical History:  Procedure Laterality Date  . APPENDECTOMY  1967  . BONE MARROW BIOPSY     2011-at [facility], 01/2010-at [facility], 06/12-[facility]  . CARDIAC CATHETERIZATION  01/31/2005   EF 60-65%  . cataract Bilateral   . CORONARY ANGIOPLASTY WITH STENT PLACEMENT  2006   STENTING OF HIS CORONARY ARTERY  . EDG Dilitation  [id number]  . 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 LYMPH NODE BIOPSY  03/15/2011   Procedure: LYMPH NODE BIOPSY;  Surgeon: Earnstine Regal, MD;  Location: [facility] ORS;  Service: General;  Laterality: Right;  Right  Anterior Cervical Lymph Node Excisional Biopsy   . lymph node removal  2011   right groin  . NASAL SINUS SURGERY  2005  . TONSILLECTOMY AND ADENOIDECTOMY    . US ECHOCARDIOGRAPHY  02/28/2005   EF 55-60%       Home Medications    Prior to Admission medications   Medication Sig Start Date End Date Taking? Authorizing Provider  carbidopa-levodopa (SINEMET IR) 25-100 MG tablet Take 1 tablet by mouth 4 (four) times daily.    [provider]  cholestyramine Lucrezia Starch) 4 G packet Take 4 g by mouth daily.  03/18/13   [provider]  ciprofloxacin (CIPRO) 500 MG tablet Take 1 tablet (500 mg total) by  mouth 2 (two) times daily. 04/06/17   Montine Circle, PA-C  clonazePAM (KLONOPIN) 1 MG tablet Take 1 mg by mouth 4 (four) times daily.    [provider]  dicyclomine (BENTYL) 20 MG tablet Take 20 mg by mouth 2 (two) times daily.  12/11/12   [provider]  diphenhydrAMINE (BENADRYL) 25 MG tablet Take 25 mg by mouth at bedtime as needed for sleep.     [provider]  Insulin Glargine (LANTUS SOLOSTAR) 100 UNIT/ML Solostar Pen Inject 6 Units into the skin at bedtime as needed.    [provider]  insulin regular (NOVOLIN R,HUMULIN R) 100 units/mL injection Inject 6 Units into the skin daily as needed for high blood sugar. For glucose greater than 175    [provider]  levothyroxine (SYNTHROID, LEVOTHROID) 100 MCG tablet Take 100 mcg by mouth daily.    [provider]  nitroGLYCERIN (NITROSTAT) 0.4 MG SL tablet PLACE 1 TABLET UNDER THE TONGUE EVERY 5 MINUTES AS NEEDED FOR CHEST PAIN 07/11/14   Nahser, Wonda Cheng, MD  olmesartan-hydrochlorothiazide (BENICAR HCT) 40-25 MG per tablet Take 0.5 tablets by mouth every morning.     [provider]  Polyethyl Glycol-Propyl Glycol (SYSTANE) 0.4-0.3 % GEL Apply 1 Dose to eye at bedtime.    [provider]  predniSONE (DELTASONE) 10 MG tablet Take 10 mg by mouth daily with breakfast.  07/25/14   [provider]  traMADol (ULTRAM) 50 MG tablet Take 1 tablet (50 mg total) by mouth every 6 (six) hours as needed. Patient taking differently: Take 50 mg by mouth 4 (four) times daily.  04/11/15   MabeForbes Cellar, MD  zolpidem (AMBIEN) 10 MG tablet Take 10 mg by mouth at bedtime.     [provider]    Family History Family History  Problem Relation Age of Onset  . Heart failure Father   . Hypertension Father   . Hearing loss Father 65       congestive heart failure  . Diabetes Father   . Hypertension Mother   . Stroke Mother 38  . Heart attack Brother     Social  History Social History   Tobacco Use  . Smoking status: Never Smoker  . Smokeless tobacco: Never Used  Substance Use Topics  . Alcohol use: No  . Drug use: No     Allergies   Cefdinir; Adhesive [tape]; Cephalosporins; Lodine [etodolac]; Lortab [hydrocodone-acetaminophen]; Niaspan [niacin]; Omnicef [cefdinir]; Valium [diazepam]; Clindamycin; and Codeine   Review of Systems Review of Systems  All other systems reviewed and are negative.    Physical Exam Updated Vital Signs BP 107/73   Pulse 86   Temp 98.5 F (36.9 C) (Oral)   Resp 18   SpO2 92%  Physical Exam  Constitutional: He is oriented to person, place, and time. He appears well-developed and well-nourished.  HENT:  Head: Normocephalic and atraumatic.  Eyes: Conjunctivae and EOM are normal. Pupils are equal, round, and reactive to light. Right eye exhibits no discharge. Left eye exhibits no discharge. No scleral icterus.  Neck: Normal range of motion. Neck supple. No JVD present.  Cardiovascular: Normal rate, regular rhythm and normal heart sounds. Exam reveals no gallop and no friction rub.  No murmur heard. Pulmonary/Chest: Effort normal and breath sounds normal. No respiratory distress. He has no wheezes. He has no rales. He exhibits no tenderness.  Abdominal: Soft. He exhibits no distension and no mass. There is no tenderness. There is no rebound and no guarding.  Musculoskeletal: Normal range of motion. He exhibits no edema or tenderness.  Neurological: He is alert and oriented to person, place, and time.  Skin: Skin is warm and dry.  Psychiatric: He has a normal mood and affect. His behavior is normal. Judgment and thought content normal.  Nursing note and vitals reviewed.    ED Treatments / Results  Labs (all labs ordered are listed, but only abnormal results are displayed) Labs Reviewed  URINALYSIS, ROUTINE W REFLEX MICROSCOPIC - Abnormal; Notable for the following components:      Result Value   Hgb  urine dipstick LARGE (*)    Leukocytes, UA TRACE (*)    Bacteria, UA RARE (*)    Squamous Epithelial / LPF 0-5 (*)    All other components within normal limits  I-STAT CHEM 8, ED - Abnormal; Notable for the following components:   Potassium 3.4 (*)    Chloride 98 (*)    Glucose, Bld 122 (*)    Calcium, Ion 1.13 (*)    All other components within normal limits    EKG  EKG Interpretation None       Radiology No results found.  Procedures Procedures (including critical care time)  Medications Ordered in ED Medications  ciprofloxacin (CIPRO) tablet 500 mg (not administered)     Initial Impression / Assessment and Plan / ED Course  I have reviewed the triage vital signs and the nursing notes.  Pertinent labs & imaging results that were available during my care of the patient were reviewed by me and considered in my medical decision making (see chart for details).     Patient with acute urinary retention.  He was retaining greater than 700 mL of urine.  Foley catheter was replaced with great success.  Patient feels well now.  Will cover for potential UTI.  Patient is allergic to cephalosporins.  Commend PCP follow-up.  Final Clinical Impressions(s) / ED Diagnoses   Final diagnoses:  Urinary retention    ED Discharge Orders        Ordered    ciprofloxacin (CIPRO) 500 MG tablet  2 times daily     04/06/17 0236       Montine Circle, PA-C 04/06/17 0241    Molpus, Jenny Reichmann, MD 04/06/17 [phone]

## 2017-04-06 NOTE — ED Triage Notes (Signed)
Pt arrived via [facility] from [facility] with complaints of urinary retention. Pt complaints of genital pain, current cath was placed two days ago. Has only had 50 cc of urine in the 5 hours

## 2017-04-10 DIAGNOSIS — N4889 Other specified disorders of penis: Secondary | ICD-10-CM | POA: Diagnosis not present

## 2017-04-15 ENCOUNTER — Emergency Department (HOSPITAL_COMMUNITY)
Admission: EM | Admit: 2017-04-15 | Discharge: 2017-04-15 | Disposition: A | Discharge status: Home / Self Care | Payer: Medicare Other | Attending: Emergency Medicine | Admitting: Emergency Medicine

## 2017-04-15 ENCOUNTER — Other Ambulatory Visit: Payer: Self-pay

## 2017-04-15 ENCOUNTER — Encounter (HOSPITAL_COMMUNITY): Payer: Self-pay

## 2017-04-15 DIAGNOSIS — E785 Hyperlipidemia, unspecified: Secondary | ICD-10-CM | POA: Diagnosis not present

## 2017-04-15 DIAGNOSIS — E039 Hypothyroidism, unspecified: Secondary | ICD-10-CM | POA: Insufficient documentation

## 2017-04-15 DIAGNOSIS — J45909 Unspecified asthma, uncomplicated: Secondary | ICD-10-CM | POA: Diagnosis not present

## 2017-04-15 DIAGNOSIS — E119 Type 2 diabetes mellitus without complications: Secondary | ICD-10-CM | POA: Insufficient documentation

## 2017-04-15 DIAGNOSIS — Z8673 Personal history of transient ischemic attack (TIA), and cerebral infarction without residual deficits: Secondary | ICD-10-CM | POA: Insufficient documentation

## 2017-04-15 DIAGNOSIS — Z794 Long term (current) use of insulin: Secondary | ICD-10-CM | POA: Insufficient documentation

## 2017-04-15 DIAGNOSIS — I2510 Atherosclerotic heart disease of native coronary artery without angina pectoris: Secondary | ICD-10-CM | POA: Insufficient documentation

## 2017-04-15 DIAGNOSIS — N390 Urinary tract infection, site not specified: Secondary | ICD-10-CM

## 2017-04-15 DIAGNOSIS — Z79899 Other long term (current) drug therapy: Secondary | ICD-10-CM | POA: Insufficient documentation

## 2017-04-15 LAB — URINALYSIS, ROUTINE W REFLEX MICROSCOPIC
Bilirubin Urine: NEGATIVE
Glucose, UA: NEGATIVE mg/dL
Ketones, ur: NEGATIVE mg/dL
NITRITE: POSITIVE — AB
Protein, ur: NEGATIVE mg/dL
SPECIFIC GRAVITY, URINE: 1.016 (ref 1.005–1.030)
pH: 5 (ref 5.0–8.0)

## 2017-04-15 MED ORDER — CIPROFLOXACIN HCL 500 MG PO TABS: 500.0000 mg | ORAL_TABLET | Freq: Two times a day (BID) | ORAL | 0 refills | Status: DC

## 2017-04-15 NOTE — ED Triage Notes (Signed)
Patient arrived via [facility] from home. Patient c/o abdominal pain from not being able to urinate. Patient has a indwelling catheter. Hx. Of blocked catheter 10 days ago. Aphasic from a previous cerebral episode, leukemia, parkinson's. Patient can understand what is going on around him but can not communicate well.

## 2017-04-15 NOTE — ED Provider Notes (Signed)
[facility] DEPT Provider Note   CSN: [id number] Arrival date & time: 04/15/17  1810     History   Chief Complaint Chief Complaint  Patient presents with  . Abdominal Pain    urinary obstruction from indwelling catheter.     HPI Nathan Kemp is a 76 y.o. male.  The history is provided by the patient. No language interpreter was used.  Abdominal Pain   This is a new problem. The current episode started more than 1 week ago. The problem occurs constantly. The problem has been gradually worsening. The pain is associated with an unknown factor. The quality of the pain is aching. The pain is moderate. Nothing aggravates the symptoms. Nothing relieves the symptoms.  Pt wife  reports he had a foley catheter placed 3 weeks ago and it is blocked and causing him pain.  Pt wife states reports he wants catheter removed.  Pt had it placed for frequent urination.   Pt can urinate without.   Past Medical History:  Diagnosis Date  . Acute GI bleeding 04/16/2011  . Anemia   . Anorexia   . Asthma    when  have congstion  of chest  . Blood dyscrasia 06/2009   thrombocytopenia  . Blood transfusion    platelets 12/12  . 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 Coronary artery disease    stent 06  . Cough   . DDD (degenerative disc disease), cervical   . Diabetes mellitus    borderline-controlled by diet  . ED (erectile dysfunction)   . Essential hypertension   . Fatigue   . GERD (gastroesophageal reflux disease)   . Heart murmur   . Hyperlipidemia   . Hypertension   . Hypothyroidism   . IBS (irritable bowel syndrome)   . Leg swelling    due to lymphedema  . Lymphedema: RLE 02/19/2015  . Obesity   . PMR (polymyalgia rheumatica) ([place]) 02/19/2015  . Pneumonia   . Polymyalgia ([place])   . Thrombocytopenia ([place])   . Thyroid disease   . Trouble swallowing   . Vitamin D deficiency     Patient Active Problem List   Diagnosis Date Noted  . AML  (acute myelogenous leukemia) ([place]) 02/20/2015  . Leukocytosis 02/19/2015  . Hepatosplenomegaly 02/19/2015  . Lymphedema: RLE 02/19/2015  . PMR (polymyalgia rheumatica) (HCC) 02/19/2015  . Cerebral thrombosis with cerebral infarction ([place]) 10/29/2014  . GERD (gastroesophageal reflux disease) 10/28/2014  . Diabetes mellitus without complication ([place]) 01/60/1093  . History of GI bleed 10/28/2014  . Vertigo 10/28/2014  . Diarrhea 10/28/2014  . Essential hypertension   . Stroke with cerebral ischemia ([place])   . Acute GI bleeding 04/16/2011  . Skin cancer 04/15/2011  . Thrombocytopenia ([place]) 03/28/2011  . Lymphadenopathy, neck 03/08/2011  . CAD (coronary artery disease) 01/13/2011  . Hypothyroidism 03/29/2010  . SPLENOMEGALY 03/29/2010  . HEPATOMEGALY, HX OF 03/29/2010  . FEVER, HX OF 03/29/2010  . HLD (hyperlipidemia) 09/07/2007  . HYPERTENSION 09/07/2007  . COUGH 09/07/2007    Past Surgical History:  Procedure Laterality Date  . APPENDECTOMY  1967  . BONE MARROW BIOPSY     2011-at [facility], 01/2010-at [facility], 06/12-[facility]  . CARDIAC CATHETERIZATION  01/31/2005   EF 60-65%  . cataract Bilateral   . CORONARY ANGIOPLASTY WITH STENT PLACEMENT  2006   STENTING OF HIS CORONARY ARTERY  . EDG Dilitation  [id number]  . 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 LYMPH NODE BIOPSY  03/15/2011   Procedure: LYMPH NODE BIOPSY;  Surgeon: Earnstine Regal, MD;  Location: [facility]S;  Service: General;  Laterality: Right;  Right Anterior Cervical Lymph Node Excisional Biopsy   . lymph node removal  2011   right groin  . NASAL SINUS SURGERY  2005  . TONSILLECTOMY AND ADENOIDECTOMY    . US ECHOCARDIOGRAPHY  02/28/2005   EF 55-60%       Home Medications    Prior to Admission medications   Medication Sig Start Date End Date Taking? Authorizing Provider  clonazePAM (KLONOPIN) 1 MG tablet Take 1 mg by mouth 4 (four) times daily.   Yes [provider]  dicyclomine (BENTYL) 20 MG tablet Take 20 mg by mouth 2 (two) times daily.  12/11/12  Yes [provider]  diphenhydrAMINE (BENADRYL) 25 MG tablet Take 25 mg by mouth at bedtime as needed for sleep.    Yes [provider]  Insulin Glargine (LANTUS SOLOSTAR) 100 UNIT/ML Solostar Pen Inject 6 Units into the skin at bedtime as needed (BG levels).    Yes [provider]  insulin regular (NOVOLIN R,HUMULIN R) 100 units/mL injection Inject 6 Units into the skin daily as needed for high blood sugar. For glucose greater than 175   Yes [provider]  levothyroxine (SYNTHROID, LEVOTHROID) 100 MCG tablet Take 100 mcg by mouth daily.   Yes [provider]  nitroGLYCERIN (NITROSTAT) 0.4 MG SL tablet PLACE 1 TABLET UNDER THE TONGUE EVERY 5 MINUTES AS NEEDED FOR CHEST PAIN 07/11/14  Yes Nahser, Wonda Cheng, MD  Polyethyl Glycol-Propyl Glycol (SYSTANE) 0.4-0.3 % GEL Apply 1 Dose to eye at bedtime.   Yes [provider]  predniSONE (DELTASONE) 10 MG tablet Take 10 mg by mouth daily with breakfast.  07/25/14  Yes [provider]  traMADol (ULTRAM) 50 MG tablet Take 1 tablet (50 mg total) by mouth every 6 (six) hours as needed. 04/11/15  Yes Mabe, Forbes Cellar, MD  zolpidem (AMBIEN) 10 MG tablet Take 10 mg by mouth at bedtime.    Yes [provider]  ciprofloxacin (CIPRO) 500 MG tablet Take 1 tablet (500 mg total) by mouth 2 (two) times daily. 04/15/17   Fransico Meadow, PA-C    Family History Family History  Problem Relation Age of Onset  . Heart failure Father   . Hypertension Father   . Hearing loss Father 25       congestive heart failure  . Diabetes Father   . Hypertension Mother   . Stroke Mother 23  . Heart attack Brother     Social History Social History   Tobacco Use  . Smoking status: Never Smoker  . Smokeless tobacco: Never Used  Substance Use Topics  . Alcohol use: No  . Drug use: No     Allergies     Cefdinir; Adhesive [tape]; Cephalosporins; Lodine [etodolac]; Lortab [hydrocodone-acetaminophen]; Niaspan [niacin]; Omnicef [cefdinir]; Valium [diazepam]; Clindamycin; and Codeine   Review of Systems Review of Systems  Gastrointestinal: Positive for abdominal pain.  All other systems reviewed and are negative.    Physical Exam Updated Vital Signs BP 125/84 (BP Location: Right Arm)   Pulse (!) 102   Temp 98.2 F (36.8 C) (Oral)   Resp 15   Ht '5\' 10"'  (1.778 m)   Wt 86.2 kg (190 lb)   SpO2 95%   BMI 27.26 kg/m  Physical Exam  Constitutional: He appears well-developed and well-nourished.  HENT:  Head: Normocephalic and atraumatic.  Eyes: Conjunctivae are normal.  Neck: Neck supple.  Cardiovascular: Normal rate and regular rhythm.  No murmur heard. Pulmonary/Chest: Effort normal and breath sounds normal. No respiratory distress.  Abdominal: Soft. There is tenderness.  Musculoskeletal: He exhibits no edema.  Neurological: He is alert.  Skin: Skin is warm and dry.  Psychiatric: He has a normal mood and affect.  Nursing note and vitals reviewed.    ED Treatments / Results  Labs (all labs ordered are listed, but only abnormal results are displayed) Labs Reviewed  URINALYSIS, ROUTINE W REFLEX MICROSCOPIC - Abnormal; Notable for the following components:      Result Value   APPearance CLOUDY (*)    Hgb urine dipstick MODERATE (*)    Nitrite POSITIVE (*)    Leukocytes, UA LARGE (*)    Bacteria, UA RARE (*)    Squamous Epithelial / LPF 0-5 (*)    All other components within normal limits    EKG  EKG Interpretation None       Radiology No results found.  Procedures Procedures (including critical care time)  Medications Ordered in ED Medications - No data to display   Initial Impression / Assessment and Plan / ED Course  I have reviewed the triage vital signs and the nursing notes.  Pertinent labs & imaging results that were available during my care of  the patient were reviewed by me and considered in my medical decision making (see chart for details).     Foley removed.   Pt able to urinate.  Ua shows RBC's tntc and WBC's tntc.  I will culture urine.  Foley left out.  Rx for cipro.  Pt advised to follow up with Urology for evaluation.   Final Clinical Impressions(s) / ED Diagnoses   Final diagnoses:  Lower urinary tract infectious disease    ED Discharge Orders        Ordered    ciprofloxacin (CIPRO) 500 MG tablet  2 times daily     04/15/17 2205    An After Visit Summary was printed and given to the patient.    Fransico Meadow, PA-C 04/15/17 2210    Dorie Rank, MD 04/16/17 [phone]

## 2017-04-15 NOTE — ED Notes (Signed)
[facility] was notified of pt's need of transportation back to his residence/home.

## 2017-04-15 NOTE — ED Notes (Signed)
Md stated the patient did not want a foley. Patient also stated he did not want a foley.

## 2017-04-18 LAB — URINE CULTURE: Culture: >=100000 — AB

## 2017-04-19 ENCOUNTER — Telehealth: Payer: Self-pay | Admitting: Emergency Medicine

## 2017-04-19 NOTE — Progress Notes (Signed)
ED Antimicrobial Stewardship Positive Culture Follow Up   Nathan Kemp is an 77 y.o. male who presented to [facility] on 04/15/2017 with a chief complaint of  Chief Complaint  Patient presents with  . Abdominal Pain    urinary obstruction from indwelling catheter.     Recent Results (from the past 720 hour(s))  Urine Culture     Status: Abnormal   Collection Time: 04/15/17 10:25 PM  Result Value Ref Range Status   Specimen Description URINE, CLEAN CATCH  Final   Special Requests NONE  Final   Culture (A)  Final    >=100,000 COLONIES/mL STAPHYLOCOCCUS SPECIES (COAGULASE NEGATIVE)   Report Status 04/18/2017 FINAL  Final   Organism ID, Bacteria STAPHYLOCOCCUS SPECIES (COAGULASE NEGATIVE) (A)  Final      Susceptibility   Staphylococcus species (coagulase negative) - MIC*    CIPROFLOXACIN >=8 RESISTANT Resistant     GENTAMICIN <=0.5 SENSITIVE Sensitive     NITROFURANTOIN <=16 SENSITIVE Sensitive     OXACILLIN >=4 RESISTANT Resistant     TETRACYCLINE >=16 RESISTANT Resistant     VANCOMYCIN 1 SENSITIVE Sensitive     TRIMETH/SULFA 160 RESISTANT Resistant     CLINDAMYCIN <=0.25 SENSITIVE Sensitive     RIFAMPIN <=0.5 SENSITIVE Sensitive     Inducible Clindamycin NEGATIVE Sensitive     * >=100,000 COLONIES/mL STAPHYLOCOCCUS SPECIES (COAGULASE NEGATIVE)    [x]  Treated with ciprofloxacin, organism resistant to prescribed antimicrobial  Renal function good  New antibiotic prescription: Stop ciprofloxacin. Start Macrobid 100mg  PO BID x 7 days  ED Provider: Geanie Kenning PA-C   Nathan Kemp 04/19/2017, 9:15 AM Infectious Diseases Pharmacist Phone# [phone]

## 2017-04-19 NOTE — Telephone Encounter (Signed)
Post ED Visit - Positive Culture Follow-up: Successful Patient Follow-Up  Culture assessed and recommendations reviewed by: [x]  Elenor Quinones, Pharm.D. []  Heide Guile, Pharm.D., BCPS AQ-ID []  Parks Neptune, Pharm.D., BCPS []  Alycia Rossetti, Pharm.D., BCPS []  [place], [place].D., BCPS, AAHIVP []  Legrand Como, Pharm.D., BCPS, AAHIVP []  Salome Arnt, PharmD, BCPS []  Dimitri Ped, PharmD, BCPS []  Vincenza Hews, PharmD, BCPS  Positive urine culture  []  Patient discharged without antimicrobial prescription and treatment is now indicated [x]  Organism is resistant to prescribed ED discharge antimicrobial []  Patient with positive blood cultures  Changes discussed with ED provider: Ardyth Harps PA New antibiotic prescription D/c ciprofloxacin, start Macrobid 100mg  po bid x 7 days  Attempting to contact patient/ emergency contact d/t expressive aphasia   Hazle Nordmann 04/19/2017, 1:45 PM

## 2017-04-23 ENCOUNTER — Emergency Department (HOSPITAL_COMMUNITY)
Admission: EM | Admit: 2017-04-23 | Discharge: 2017-04-23 | Disposition: A | Discharge status: Home / Self Care | Payer: Medicare Other | Attending: Emergency Medicine | Admitting: Emergency Medicine

## 2017-04-23 ENCOUNTER — Emergency Department (HOSPITAL_COMMUNITY): Payer: Medicare Other

## 2017-04-23 ENCOUNTER — Encounter (HOSPITAL_COMMUNITY): Payer: Self-pay | Admitting: Emergency Medicine

## 2017-04-23 DIAGNOSIS — Z955 Presence of coronary angioplasty implant and graft: Secondary | ICD-10-CM | POA: Insufficient documentation

## 2017-04-23 DIAGNOSIS — R569 Unspecified convulsions: Secondary | ICD-10-CM | POA: Insufficient documentation

## 2017-04-23 DIAGNOSIS — Z79899 Other long term (current) drug therapy: Secondary | ICD-10-CM | POA: Diagnosis not present

## 2017-04-23 DIAGNOSIS — E039 Hypothyroidism, unspecified: Secondary | ICD-10-CM | POA: Diagnosis not present

## 2017-04-23 DIAGNOSIS — I2510 Atherosclerotic heart disease of native coronary artery without angina pectoris: Secondary | ICD-10-CM | POA: Diagnosis not present

## 2017-04-23 DIAGNOSIS — I10 Essential (primary) hypertension: Secondary | ICD-10-CM | POA: Diagnosis not present

## 2017-04-23 DIAGNOSIS — E119 Type 2 diabetes mellitus without complications: Secondary | ICD-10-CM | POA: Insufficient documentation

## 2017-04-23 LAB — CBC WITH DIFFERENTIAL/PLATELET
BASOS PCT: 2 %
Band Neutrophils: 0 %
Basophils Absolute: 0.2 10*3/uL — ABNORMAL HIGH (ref 0.0–0.1)
Blasts: 0 %
EOS PCT: 1 %
Eosinophils Absolute: 0.1 10*3/uL (ref 0.0–0.7)
HCT: 37.9 % — ABNORMAL LOW (ref 39.0–52.0)
HEMOGLOBIN: 11.6 g/dL — AB (ref 13.0–17.0)
LYMPHS PCT: 16 %
Lymphs Abs: 1.7 10*3/uL (ref 0.7–4.0)
MCH: 30.6 pg (ref 26.0–34.0)
MCHC: 30.6 g/dL (ref 30.0–36.0)
MCV: 100 fL (ref 78.0–100.0)
MONO ABS: 3.2 10*3/uL — AB (ref 0.1–1.0)
Metamyelocytes Relative: 0 %
Monocytes Relative: 31 %
Myelocytes: 0 %
NEUTROS PCT: 50 %
NRBC: 0 /100{WBCs}
Neutro Abs: 5.2 10*3/uL (ref 1.7–7.7)
OTHER: 0 %
Platelets: 34 10*3/uL — ABNORMAL LOW (ref 150–400)
Promyelocytes Absolute: 0 %
RBC: 3.79 MIL/uL — ABNORMAL LOW (ref 4.22–5.81)
RDW: 17.9 % — ABNORMAL HIGH (ref 11.5–15.5)
WBC: 10.4 10*3/uL (ref 4.0–10.5)

## 2017-04-23 LAB — COMPREHENSIVE METABOLIC PANEL
ALK PHOS: 140 U/L — AB (ref 38–126)
ALT: 20 U/L (ref 17–63)
AST: 33 U/L (ref 15–41)
Albumin: 3.1 g/dL — ABNORMAL LOW (ref 3.5–5.0)
Anion gap: 13 (ref 5–15)
BILIRUBIN TOTAL: 1.4 mg/dL — AB (ref 0.3–1.2)
BUN: 14 mg/dL (ref 6–20)
CALCIUM: 8 mg/dL — AB (ref 8.9–10.3)
CO2: 26 mmol/L (ref 22–32)
Chloride: 96 mmol/L — ABNORMAL LOW (ref 101–111)
Creatinine, Ser: 0.84 mg/dL (ref 0.61–1.24)
Glucose, Bld: 159 mg/dL — ABNORMAL HIGH (ref 65–99)
Potassium: 4.3 mmol/L (ref 3.5–5.1)
SODIUM: 135 mmol/L (ref 135–145)
TOTAL PROTEIN: 5.6 g/dL — AB (ref 6.5–8.1)

## 2017-04-23 LAB — I-STAT CG4 LACTIC ACID, ED: LACTIC ACID, VENOUS: 2.97 mmol/L — AB (ref 0.5–1.9)

## 2017-04-23 LAB — URINALYSIS, ROUTINE W REFLEX MICROSCOPIC
Bilirubin Urine: NEGATIVE
GLUCOSE, UA: NEGATIVE mg/dL
Ketones, ur: NEGATIVE mg/dL
Leukocytes, UA: NEGATIVE
Nitrite: NEGATIVE
PH: 5 (ref 5.0–8.0)
Protein, ur: NEGATIVE mg/dL
SPECIFIC GRAVITY, URINE: 1.013 (ref 1.005–1.030)

## 2017-04-23 LAB — I-STAT TROPONIN, ED: Troponin i, poc: 0 ng/mL (ref 0.00–0.08)

## 2017-04-23 MED ORDER — LEVETIRACETAM 500 MG PO TABS
1000.0000 mg | ORAL_TABLET | Freq: Once | ORAL | Status: AC
  Administered 2017-04-23: 1000 mg via ORAL
  Filled 2017-04-23: qty 2

## 2017-04-23 MED ORDER — LEVETIRACETAM 500 MG PO TABS: 500.0000 mg | ORAL_TABLET | Freq: Two times a day (BID) | ORAL | 0 refills | Status: AC

## 2017-04-23 NOTE — ED Notes (Signed)
Patient attempting to use urinal at this time for specimen.  To call out when finished.

## 2017-04-23 NOTE — ED Triage Notes (Signed)
Pt BIB [facility] for seizure like activity. Wife stated patient was on the toilet when he had what looked like a grand mal seizure. After he slumped over a wheelchair. When EMS arrived patient was unresponsive sats at 84. Pt now alert. Hx of CVA and baseline aphasia.

## 2017-04-23 NOTE — ED Provider Notes (Signed)
[facility] EMERGENCY DEPARTMENT Provider Note   CSN: [id number] Arrival date & time: 04/23/17  0522     History   Chief Complaint Chief Complaint  Patient presents with  . Seizures  . unresponsive    HPI Nathan Kemp is a 77 y.o. male.  Patient presents to the emergency department for evaluation of possible seizure.  Patient was on the toilet when he slumped over.  Wife reports that he was shaking all over and unresponsive.  EMS was called to the house.  EMS reports that when they got to him he was very confused and not able to follow any commands.  They report that he seemed "postictal".  During transport he has become more awake and alert and is now able to follow all commands.  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 Level V Caveat due to non-verbal patient.      Past Medical History:  Diagnosis Date  . Acute GI bleeding 04/16/2011  . Anemia   . Anorexia   . Asthma    when  have congstion  of chest  . Blood dyscrasia 06/2009   thrombocytopenia  . Blood transfusion    platelets 12/12  . 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 Coronary artery disease    stent 06  . Cough   . DDD (degenerative disc disease), cervical   . Diabetes mellitus    borderline-controlled by diet  . ED (erectile dysfunction)   . Essential hypertension   . Fatigue   . GERD (gastroesophageal reflux disease)   . Heart murmur   . Hyperlipidemia   . Hypertension   . Hypothyroidism   . IBS (irritable bowel syndrome)   . Leg swelling    due to lymphedema  . Lymphedema: RLE 02/19/2015  . Obesity   . PMR (polymyalgia rheumatica) ([place]) 02/19/2015  . Pneumonia   . Polymyalgia ([place])   . Thrombocytopenia ([place])   . Thyroid disease   . Trouble swallowing   . Vitamin D deficiency     Patient Active Problem List   Diagnosis Date Noted  . 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 Leukocytosis 02/19/2015  . Hepatosplenomegaly 02/19/2015  . Lymphedema: RLE 02/19/2015  . PMR (polymyalgia rheumatica) (HCC) 02/19/2015  . Cerebral thrombosis with cerebral infarction ([place]) 10/29/2014  . GERD (gastroesophageal reflux disease) 10/28/2014  . Diabetes mellitus without complication ([place]) 00/37/0488  . History of GI bleed 10/28/2014  . Vertigo 10/28/2014  . Diarrhea 10/28/2014  . Essential hypertension   . Stroke with cerebral ischemia ([place])   . Acute GI bleeding 04/16/2011  . Skin cancer 04/15/2011  . Thrombocytopenia ([place]) 03/28/2011  . Lymphadenopathy, neck 03/08/2011  . CAD (coronary artery disease) 01/13/2011  . Hypothyroidism 03/29/2010  . SPLENOMEGALY 03/29/2010  . HEPATOMEGALY, HX OF 03/29/2010  . FEVER, HX OF 03/29/2010  . HLD (hyperlipidemia) 09/07/2007  . HYPERTENSION 09/07/2007  . COUGH 09/07/2007    Past Surgical History:  Procedure Laterality Date  . APPENDECTOMY  1967  . BONE MARROW BIOPSY     2011-at [facility], 01/2010-at [facility], 06/12-[facility]  . CARDIAC CATHETERIZATION  01/31/2005   EF 60-65%  . cataract Bilateral   . CORONARY ANGIOPLASTY WITH STENT PLACEMENT  2006   STENTING OF HIS CORONARY ARTERY  . EDG Dilitation  [id number]  . 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 LYMPH NODE BIOPSY  03/15/2011   Procedure: LYMPH NODE BIOPSY;  Surgeon: Earnstine Regal, MD;  Location: [facility] ORS;  Service: General;  Laterality: Right;  Right Anterior Cervical Lymph Node Excisional Biopsy   . lymph node removal  2011   right groin  . NASAL SINUS SURGERY  2005  . TONSILLECTOMY AND ADENOIDECTOMY    . US ECHOCARDIOGRAPHY  02/28/2005   EF 55-60%       Home Medications    Prior to Admission medications   Medication Sig Start Date End Date Taking? Authorizing Provider  ciprofloxacin (CIPRO) 500 MG tablet Take 1 tablet (500 mg total) by mouth 2 (two) times daily. 04/15/17   Fransico Meadow, PA-C   clonazePAM (KLONOPIN) 1 MG tablet Take 1 mg by mouth 4 (four) times daily.    [provider]  dicyclomine (BENTYL) 20 MG tablet Take 20 mg by mouth 2 (two) times daily.  12/11/12   [provider]  diphenhydrAMINE (BENADRYL) 25 MG tablet Take 25 mg by mouth at bedtime as needed for sleep.     [provider]  Insulin Glargine (LANTUS SOLOSTAR) 100 UNIT/ML Solostar Pen Inject 6 Units into the skin at bedtime as needed (BG levels).     [provider]  insulin regular (NOVOLIN R,HUMULIN R) 100 units/mL injection Inject 6 Units into the skin daily as needed for high blood sugar. For glucose greater than 175    [provider]  levothyroxine (SYNTHROID, LEVOTHROID) 100 MCG tablet Take 100 mcg by mouth daily.    [provider]  nitroGLYCERIN (NITROSTAT) 0.4 MG SL tablet PLACE 1 TABLET UNDER THE TONGUE EVERY 5 MINUTES AS NEEDED FOR CHEST PAIN 07/11/14   Nahser, Wonda Cheng, MD  Polyethyl Glycol-Propyl Glycol (SYSTANE) 0.4-0.3 % GEL Apply 1 Dose to eye at bedtime.    [provider]  predniSONE (DELTASONE) 10 MG tablet Take 10 mg by mouth daily with breakfast.  07/25/14   [provider]  traMADol (ULTRAM) 50 MG tablet Take 1 tablet (50 mg total) by mouth every 6 (six) hours as needed. 04/11/15   MabeForbes Cellar, MD  zolpidem (AMBIEN) 10 MG tablet Take 10 mg by mouth at bedtime.     [provider]    Family History Family History  Problem Relation Age of Onset  . Heart failure Father   . Hypertension Father   . Hearing loss Father 69       congestive heart failure  . Diabetes Father   . Hypertension Mother   . Stroke Mother 68  . Heart attack Brother     Social History Social History   Tobacco Use  . Smoking status: Never Smoker  . Smokeless tobacco: Never Used  Substance Use Topics  . Alcohol use: No  . Drug use: No     Allergies   Cefdinir; Adhesive [tape]; Cephalosporins; Lodine [etodolac]; Lortab  [hydrocodone-acetaminophen]; Niaspan [niacin]; Omnicef [cefdinir]; Valium [diazepam]; Clindamycin; and Codeine   Review of Systems Review of Systems  Unable to perform ROS: Patient nonverbal     Physical Exam Updated Vital Signs BP 123/70   Pulse 98   Temp 98 F (36.7 C) (Oral)   Resp 19   Ht _0  (1.778 m)   Wt 86.2 kg (190 lb)   SpO2 98%   BMI 27.26 kg/m   Physical Exam  Constitutional: He appears well-developed and well-nourished. No distress.  HENT:  Head: Normocephalic and  atraumatic.  Right Ear: Hearing normal.  Left Ear: Hearing normal.  Nose: Nose normal.  Mouth/Throat: Oropharynx is clear and moist and mucous membranes are normal.  Eyes: Conjunctivae and EOM are normal. Pupils are equal, round, and reactive to light.  Neck: Normal range of motion. Neck supple.  Cardiovascular: Regular rhythm, S1 normal and S2 normal. Exam reveals no gallop and no friction rub.  No murmur heard. Pulmonary/Chest: Effort normal and breath sounds normal. No respiratory distress. He exhibits no tenderness.  Abdominal: Soft. Normal appearance and bowel sounds are normal. There is no hepatosplenomegaly. There is no tenderness. There is no rebound, no guarding, no tenderness at McBurney's point and negative Murphy's sign. No hernia.  Musculoskeletal: Normal range of motion.  Neurological: He is alert. He has normal strength. No cranial nerve deficit or sensory deficit. Coordination normal. GCS eye subscore is 4. GCS verbal subscore is 5. GCS motor subscore is 6.  Skin: Skin is warm, dry and intact. No rash noted. No cyanosis.  Psychiatric: He has a normal mood and affect. His speech is normal and behavior is normal. Thought content normal.  Nursing note and vitals reviewed.    ED Treatments / Results  Labs (all labs ordered are listed, but only abnormal results are displayed) Labs Reviewed  CBC WITH DIFFERENTIAL/PLATELET - Abnormal; Notable for the following components:      Result  Value   RBC 3.79 (*)    Hemoglobin 11.6 (*)    HCT 37.9 (*)    RDW 17.9 (*)    Platelets 34 (*)    Monocytes Absolute 3.2 (*)    Basophils Absolute 0.2 (*)    All other components within normal limits  COMPREHENSIVE METABOLIC PANEL - Abnormal; Notable for the following components:   Chloride 96 (*)    Glucose, Bld 159 (*)    Calcium 8.0 (*)    Total Protein 5.6 (*)    Albumin 3.1 (*)    Alkaline Phosphatase 140 (*)    Total Bilirubin 1.4 (*)    All other components within normal limits  I-STAT CG4 LACTIC ACID, ED - Abnormal; Notable for the following components:   Lactic Acid, Venous 2.97 (*)    All other components within normal limits  URINE CULTURE  URINALYSIS, ROUTINE W REFLEX MICROSCOPIC  I-STAT TROPONIN, ED    EKG  EKG Interpretation  Date/Time:  Sunday April 23 2017 05:31:20 EST Ventricular Rate:  107 PR Interval:    QRS Duration: 101 QT Interval:  346 QTC Calculation: 462 R Axis:   -83 Text Interpretation:  Sinus tachycardia Atrial premature complex Sinus pause Probable left atrial enlargement LAD, consider left anterior fascicular block Probable anteroseptal infarct, old Confirmed by Orpah Greek [phone]) on 04/23/2017 5:37:21 AM       Radiology Dg Chest 2 View  Result Date: 04/23/2017 CLINICAL DATA:  77 y/o  M; syncope, seizure like activity. EXAM: CHEST  2 VIEW COMPARISON:  02/19/2015 chest radiograph FINDINGS: Normal cardiac silhouette. Aortic atherosclerosis with calcification. Right central venous catheter tip projects over mid SVC. Ill-defined opacities of the lung bases. Lower thoracic spine compression deformity post kyphoplasty is stable. IMPRESSION: Bibasilar opacities probably representing atelectasis, possible with small effusions. Electronically Signed   By: Kristine Garbe M.D.   On: 04/23/2017 06:02   Ct Head Wo Contrast  Result Date: 04/23/2017 CLINICAL DATA:  77 y/o  M; syncope, question seizure like activity. EXAM: CT HEAD  WITHOUT CONTRAST TECHNIQUE: Contiguous axial images were obtained from the  base of the skull through the vertex without intravenous contrast. COMPARISON:  04/08/2015 CT head FINDINGS: Brain: No evidence of acute infarction, hemorrhage, hydrocephalus, extra-axial collection or mass lesion/mass effect. Stable chronic microvascular ischemic changes and parenchymal volume loss of the brain. Vascular: Calcific atherosclerosis of carotid siphons. No hyperdense vessel. Skull: Normal. Negative for fracture or focal lesion. Sinuses/Orbits: Moderate maxillary sinus mucosal thickening. Otherwise negative. Other: None. IMPRESSION: 1. No acute intracranial abnormality identified. 2. Stable chronic microvascular ischemic changes and parenchymal volume loss of the brain. Electronically Signed   By: Kristine Garbe M.D.   On: 04/23/2017 06:09    Procedures Procedures (including critical care time)  Medications Ordered in ED Medications  levETIRAcetam (KEPPRA) tablet 1,000 mg (not administered)     Initial Impression / Assessment and Plan / ED Course  I have reviewed the triage vital signs and the nursing notes.  Pertinent labs & imaging results that were available during my care of the patient were reviewed by me and considered in my medical decision making (see chart for details).     Patient brought to the emergency department from home for evaluation of possible seizure.  Wife gives a very good description of what sounds like seizure followed by postictal state which was corroborated by EMS.  Patient does not have a seizure history.  At arrival to the ER he is back to his baseline.  He has been monitored and has not had any further seizure-like activity.  Patient workup is unremarkable.  His lactic acid was slightly elevated, likely secondary to the seizure.  He has not have any evidence of sepsis at this time.  CT head unremarkable.  Case discussed with Dr. Lorraine Lax, on-call for neurology.  He was  able to review the patient's previous history including office visits in neurology and his current and previous imaging.  He felt that the patient is at increased risk for further seizures and does recommend treatment.  Patient initiated on [place].  Treatment plan was discussed with patient and wife.  They understand and are in agreement with initiating the [place] and follow-up as an outpatient with his neurologist.  Final Clinical Impressions(s) / ED Diagnoses   Final diagnoses:  Seizure [facility])    ED Discharge Orders    None       Orpah Greek, MD 04/23/17 [phone]

## 2017-04-23 NOTE — ED Notes (Signed)
Notified [facility] for transportation back home 

## 2017-04-23 NOTE — ED Notes (Signed)
Delay in lab draw,  Pt not in room at this time. 

## 2017-04-23 NOTE — ED Notes (Signed)
Nurse currently starting IV and will draw labs. 

## 2017-04-24 ENCOUNTER — Telehealth: Payer: Self-pay | Admitting: Neurology

## 2017-04-24 NOTE — Telephone Encounter (Signed)
Pt's wife Reginold Agent left a message asking for a call back from Dr Doristine Devoid assistant in regards to pt having a seizure

## 2017-04-24 NOTE — Telephone Encounter (Signed)
Put him in the work in spot on wed, 9:15-10:15

## 2017-04-24 NOTE — Telephone Encounter (Signed)
Spoke with patient's wife.  She states patient had a seizure this weekend (notes in system) and he was placed on Keppra. She was told to follow up with Neurology.  She has appt with Dr. Mervyn Skeeters, but they have never moved because of his multiple medical issues. They do not plan on moving and she would like to follow up here.   She is having a lot of issues with his disease progression as well. Patient is weak and now unable to stand or walk alone. He isn't talking at all now. He has been in and out of hospital with cellulitis and was in [facility] for rehab. He was discharged a week ago, so Palliative care is coming back in the home tomorrow to evaluate to resume care. She does have CNA help in the home.   She wants to know what to do about follow up. Please advise.

## 2017-04-25 LAB — URINE CULTURE: Culture: >=100000 — AB

## 2017-04-25 NOTE — Progress Notes (Signed)
Nathan Kemp was seen today in the movement disorders clinic for neurologic consultation at the request of Burnard Bunting, MD.  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  The records that were made available to me were reviewed.  They are quite extensive.  The patient developed new onset dysarthria and speech disturbance in July, 2016 and was admitted for stroke workup, which turned out to be negative.  He was worked up for myasthenia gravis and that workup was apparently negative.  Wife states that they thought that it was a drug reaction from a drug given by a dentist. She states today, however, that the speech issues were NOT acute in onset and were slow to come on and have been progressing ever since.   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 Follow up cytology showed resolution of leukemic meningitis but symptoms continued to worsen. Multiple MRI brain and MR C/T-spine were negative for structural/mass lesions.  Speech issues were subsequently felt due to spasmodic dysphonia with intermittent myoclonus and it was responsive to Ativan.  Wife states that when he gets chemo he has tremor and jerking of the R arm/leg but the klonopin helps.  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  DaTScan apparently was attempted, but refused by insurance.  In November, 2017 the patient began to have fairly acute worsening of right leg tremor, worsening gait, worsening speech, falls and lab work revealed an elevated ammonia for which he was treated with lactulose.  He was apparently seen by multiple gastroenterologists at places such as [facility] and [facility] and no known etiology was noted.  He saw Dr. Hall Busing at [facility] for movement disorders in January, 2018.  EMG was  completed to rule out motor neuron disease.  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  Her examination was somewhat limited as the patient was being examined on levodopa and clonazepam.  Pts wife states that L arm and R leg are fairly "useless."  He has had essential tremor in the L hand for over 10 years, but the tremor in the L arm now is so bad that it has made that arm "useless."  It is not a different tremor than he had 10 years ago but it is more severe.  The tremor in the R leg can be there when he sits or stands but mostly if he stands.  If there is no weight on the leg (laying down), his leg never tremors.  Mostly the leg will tremor after he stands and then he will be unable to stand because of it.  He used to have episodes during chemo that were bad but none of those for 6 months.     Specific Symptoms:  Tremor: Yes.   Family hx of similar:  No. Voice: more dysphasia Sleep: sleeps well  Vivid Dreams:  No.  Acting out dreams:  No. Wet Pillows: No. Postural symptoms:  Yes.    Falls?  Yes.   (uses walker now and has gait belt) Bradykinesia symptoms: slow movements (doing neurorehab now and it helps) Loss of smell:  Yes.  Loss of taste:  No. Urinary Incontinence:  No. Difficulty Swallowing:  No. Handwriting, micrographia: No. Trouble with ADL's:  No.  Trouble buttoning clothing: No. Depression:  No. Memory changes:  Yes.   Hallucinations:  No.  visual distortions: No. N/V:  No. Lightheaded:  No.  Syncope: No. Diplopia:  No. Dyskinesia:  No.  Neuroimaging has  previously been performed.  It is not available for my review today.   patient did have an MRI of the brain on 04/06/2016 at [facility]  I do not have the films.  I do have the report.  It was reported only to show small vessel disease.    10/13/16  update:  Patient is seen today in follow-up.  He had a DaT scan on 10/06/2016.  I reviewed this.  It was reported to show bilateral decreased activity in the putamen and subtle decreased activity in the head of the right caudate.  Patient had neuropsych testing from [facility] on 06/29/2016.  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  He felt that semantic dementia and logopenic variant seems less likely.  He felt that CBGD and FTD with motor neuron disease was unlikely.  It stated that the dat and/or PET scan was recommended.  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  It also stated that dopaminergic medications and anticholinesterase medications are not helpful.  Clonazepam was recommended for tremor (which he is on).  Mild depression was also noted.  We did decidide last visit to try and hold his levodopa. Pt states that he did okay with that.  Wife thinks that he is more stiff since levodopa d/c but patient denies that.  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  His speech production has gotten worse.  Last speech therapy was a year ago.  Going to ACT by [employer].  Asks about support group.  04/26/17 update:   The patient is seen in follow-up today accompanied by his wife who supplements the history.  I have not seen the patient in quite some time.  I have reviewed numerous records made available to me.  He continues to see oncology at [facility].  At last visit  with me, the plan was for the patient and his [place] and receive care with Dr. Mervyn Skeeters.   He actually still has a new patient appt with her scheduled for 05/04/17.  However, as the patient's health continued to deteriorate physically and mentally, they ultimately made the decision to stay locally and not move.  He then presented to the ER on 04/23/17.  He woke up to go to the bathroom and had just urinated and she was transferring him from the toilet and then started "flailing."  His hands were not jerking or shaking together but "flailing" separate.  He then fell forward and was limp.  The flailing lasted 5 min and the slumped over portion lasted 10 min.  He woke up when he got to the hospital and his wife states that "  he looked like nothing had happened."   He had a CT of the brain and that was stable and non acute.  He was started on Keppra 500 mg bid.  Wife states that this makes him very lethargic and he cannot even hold his urine at night because it makes him so sleepy. He is on a big dose of klonopin, 1 mg qid.  Wife doesn't think that this makes him sleepy and states that without it he will get tremor.   Urinary incontinence is not a new issue for him and they have tried to have cath inserted x 2 but he kept getting UTI's so they had that removed.  They do have a CNA 2 days per week, 4 hours per day.  They have a palliative care evaluation tomorrow. Has been in palliative before but d/c before recent SNF stay for rehab.   He has undergone intermittent PT/ST/OT but don't think that ST can help much further so that they are just continuing with PT/ST.  He was just d/c from subacute rehab 2 weeks ago at [facility] when he had cellulitis.    PREVIOUS MEDICATIONS: Sinemet  ALLERGIES:   Allergies  Allergen Reactions  . Cefdinir Hives  . Adhesive [Tape] Other (See Comments)    blisters  . Cephalosporins Hives  . Lodine [Etodolac]     Vertigo  . Lortab  [Hydrocodone-Acetaminophen] Other (See Comments)    Makes patient very loopy. Update   Able to take now  . Niaspan [Niacin] Hives  . Omnicef [Cefdinir] Hives  . Valium [Diazepam] Other (See Comments)    Confusion   . Clindamycin Nausea Only and Other (See Comments)    Stomach cramps  . Codeine Rash    Had rash previously but not recently-taken in cough medicine and no problems but patient can tolerate    CURRENT MEDICATIONS:  Outpatient Encounter Medications as of 04/26/2017  Medication Sig  . clonazePAM (KLONOPIN) 1 MG tablet Take 1 mg by mouth 4 (four) times daily.  Marland Kitchen dicyclomine (BENTYL) 20 MG tablet Take 20 mg by mouth 2 (two) times daily.   . diphenhydrAMINE (BENADRYL) 25 MG tablet Take 25 mg by mouth at bedtime as needed for sleep.   . insulin regular (NOVOLIN R,HUMULIN R) 100 units/mL injection Inject 6 Units into the skin daily as needed for high blood sugar. For glucose greater than 175  . levETIRAcetam (KEPPRA) 500 MG tablet Take 1 tablet (500 mg total) by mouth 2 (two) times daily.  Marland Kitchen levothyroxine (SYNTHROID, LEVOTHROID) 100 MCG tablet Take 100 mcg by mouth daily.  [name] Glycol-Propyl Glycol (SYSTANE) 0.4-0.3 % GEL Apply 1 Dose to eye at bedtime.  . predniSONE (DELTASONE) 10 MG tablet Take 10 mg by mouth daily with breakfast.   . traMADol (ULTRAM) 50 MG tablet Take 1 tablet (50 mg total) by mouth every 6 (six) hours as needed. (Patient taking differently: Take 50 mg by mouth 4 (four) times daily. )  . zolpidem (AMBIEN) 10 MG tablet Take 10 mg by mouth at bedtime.   . nitroGLYCERIN (NITROSTAT) 0.4 MG SL tablet PLACE 1 TABLET UNDER THE TONGUE EVERY 5 MINUTES AS NEEDED FOR CHEST PAIN (Patient not taking: Reported on 04/26/2017)  . [DISCONTINUED] ciprofloxacin (CIPRO) 500 MG tablet Take 1 tablet (500 mg total) by mouth 2 (two) times daily.  . [DISCONTINUED] Insulin Glargine (LANTUS SOLOSTAR) 100 UNIT/ML Solostar Pen Inject 6 Units into the skin at bedtime as needed (BG levels).      No  facility-administered encounter medications on file as of 04/26/2017.     PAST MEDICAL HISTORY:   Past Medical History:  Diagnosis Date  . Acute GI bleeding 04/16/2011  . Anemia   . Anorexia   . Asthma    when  have congstion  of chest  . Blood dyscrasia 06/2009   thrombocytopenia  . Blood transfusion    platelets 12/12  . 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 Coronary artery disease    stent 06  . Cough   . DDD (degenerative disc disease), cervical   . Diabetes mellitus    borderline-controlled by diet  . ED (erectile dysfunction)   . Essential hypertension   . Fatigue   . GERD (gastroesophageal reflux disease)   . Heart murmur   . Hyperlipidemia   . Hypertension   . Hypothyroidism   . IBS (irritable bowel syndrome)   . Leg swelling    due to lymphedema  . Lymphedema: RLE 02/19/2015  . Obesity   . PMR (polymyalgia rheumatica) ([place]) 02/19/2015  . Pneumonia   . Polymyalgia ([place])   . Thrombocytopenia ([place])   . Thyroid disease   . Trouble swallowing   . Vitamin D deficiency     PAST SURGICAL HISTORY:   Past Surgical History:  Procedure Laterality Date  . APPENDECTOMY  1967  . BONE MARROW BIOPSY     2011-at [facility], 01/2010-at [place], 06/12-[facility]  . CARDIAC CATHETERIZATION  01/31/2005   EF 60-65%  . cataract Bilateral   . CORONARY ANGIOPLASTY WITH STENT PLACEMENT  2006   STENTING OF HIS CORONARY ARTERY  . EDG Dilitation  [id number]  . 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 LYMPH NODE BIOPSY  03/15/2011   Procedure: LYMPH NODE BIOPSY;  Surgeon: Earnstine Regal, MD;  Location: [facility] ORS;  Service: General;  Laterality: Right;  Right Anterior Cervical Lymph Node Excisional Biopsy   . lymph node removal  2011   right groin  . NASAL SINUS SURGERY  2005  . TONSILLECTOMY AND ADENOIDECTOMY    . US ECHOCARDIOGRAPHY  02/28/2005   EF 55-60%    SOCIAL HISTORY:   Social History   Socioeconomic History   . Marital status: Married    Spouse name: Not on file  . Number of children: Not on file  . Years of education: Not on file  . Highest education level: Not on file  Social Needs  . Financial resource strain: Not on file  . Food insecurity - worry: Not on file  . Food insecurity - inability: Not on file  . Transportation needs - medical: Not on file  . Transportation needs - non-medical: Not on file  Occupational History  . Not on file  Tobacco Use  . Smoking status: Never Smoker  . Smokeless tobacco: Never Used  Substance and Sexual Activity  . Alcohol use: No  . Drug use: No  . Sexual activity: Not Currently  Other Topics Concern  . Not on file  Social History Narrative  . Not on file    FAMILY HISTORY:   Family Status  Relation Name Status  . Father  Deceased at age 42  . Mother  Deceased at age 70  . Sister  Alive  . Brother  Alive  . Daughter x2 Alive    ROS:  A complete 10 system review of systems was obtained and was unremarkable  apart from what is mentioned above.  PHYSICAL EXAMINATION:    VITALS:   Vitals:   04/26/17 0929  BP: 124/70  Pulse: 75  SpO2: 95%    GEN:  The patient appears stated age and is in NAD. HEENT:  Normocephalic, atraumatic.  The mucous membranes are moist. The superficial temporal arteries are without ropiness or tenderness. CV:  RRR Lungs:  CTAB Neck/HEME:  There are no carotid bruits bilaterally.  Neurological examination:  Orientation: The patient is alert and oriented x3. He does fall asleep about 3/4 of the way through the visit but it was a long visit and wife had many questions Cranial nerves: There is good facial symmetry.  Extraocular muscles are intact. The visual fields are full to confrontational testing. The speech is slow and purposeful, mildy dyphasic.  When given various objects, he is able to name them, but he is very slow and speech is purposeful.  Soft palate rises symmetrically and there is no tongue deviation.  Hearing is intact to conversational tone. Sensation: Sensation is intact to light and pinprick throughout (facial, trunk, extremities). Vibration is intact at the bilateral big toe. There is no extinction with double simultaneous stimulation. There is no sensory dermatomal level identified.  No astereognosis.   No agraphesthesia  Motor: Strength is 5/5 in the bilateral upper and lower extremities.   Shoulder shrug is equal and symmetric.  There is no pronator drift.  No fasciculations. Deep tendon reflexes: Deep tendon reflexes are 2+/4 at the bilateral biceps, triceps, brachioradialis, patella and achilles. Plantar responses are downgoing bilaterally.  Movement examination: Tone: There is normal tone in the bilateral upper extremities.  The tone in the lower extremities is normal.  Abnormal movements: none.  No postural tremor.  No intention tremor. Coordination:  There is no decremation with RAM's, with any form of RAMS, including alternating supination and pronation of the forearm, hand opening and closing, finger taps, heel taps and toe taps. Gait and Station: wife states that he no longer walks so we decided not to attempt to ambulate  ASSESSMENT/PLAN:  1.  primary progessive aphasia  -DaT scan done on 10/06/16 was subtly positive which is often the case in PPA  -caregiver support provided.  Talked about looking for living on the first floor.  Talked about various facilities that may be appropriate both for the patient's wife as well as for the patient.  2.  PMR  -Currently under control.  3.  Tremor  -Actually did not see any on my examination today nor did I last visit.Marland Kitchen  He and his wife report a 10+ year history of essential tremor, primarily of the left hand and it sounds like this condition has worsened with the course of time.  He is on a very large dose of clonazepam, and I wonder if this is what is contributing to sleepiness, but his wife does not think so.  I did not change this  today, given the large dose and he would certainly need to be weaned off this if this is changed with time.  4.  AML with leptomeningeal dissemination  -Pt is seeing neuro-onc at [place].  Seizure  -it is difficult to tell whether this was really a seizure, as wife describes flailing of the arms and not any rhythmic movement of the arms/legs.  To me, this does not sound like seizure.  Regardless, he is having side effects of the keppra and we will slowly d/c this and try lamictal and  work to 50 mg bid.  He had not taken keppra last night due to urinating in the night due to lethargy.  Also didn't take it this AM.  Can just d/c the medication.  6.  Greater than 50% of the 40-minute visit was spent in counseling with the patient and his wife.  His wife had multiple questions are answered them to the best of my ability today.  I was plan on seeing him back in the next 6-8 months, sooner should new neurologic issues arise.  Recorded time  did not include the 40 min of record review which was detailed above, which was non face to face time.   Cc:  Burnard Bunting, MD

## 2017-04-25 NOTE — Telephone Encounter (Signed)
Appt made

## 2017-04-26 ENCOUNTER — Ambulatory Visit (INDEPENDENT_AMBULATORY_CARE_PROVIDER_SITE_OTHER): Payer: Medicare Other | Admitting: Neurology

## 2017-04-26 ENCOUNTER — Telehealth: Payer: Self-pay | Admitting: Emergency Medicine

## 2017-04-26 ENCOUNTER — Encounter: Payer: Self-pay | Admitting: Neurology

## 2017-04-26 VITALS — BP 124/70 | HR 75

## 2017-04-26 DIAGNOSIS — F0280 Dementia in other diseases classified elsewhere without behavioral disturbance: Secondary | ICD-10-CM | POA: Diagnosis not present

## 2017-04-26 DIAGNOSIS — R569 Unspecified convulsions: Secondary | ICD-10-CM

## 2017-04-26 DIAGNOSIS — G3101 Pick's disease: Secondary | ICD-10-CM | POA: Diagnosis not present

## 2017-04-26 MED ORDER — LAMOTRIGINE 25 MG PO TABS: ORAL_TABLET | ORAL | 2 refills | Status: DC

## 2017-04-26 NOTE — Progress Notes (Signed)
Note sent to Dr. Florene Glen.

## 2017-04-26 NOTE — Telephone Encounter (Signed)
Post ED Visit - Positive Culture Follow-up  Culture report reviewed by antimicrobial stewardship pharmacist:  []  Elenor Quinones, Pharm.D. []  Heide Guile, Pharm.D., BCPS AQ-ID []  Parks Neptune, Pharm.D., BCPS []  Alycia Rossetti, Pharm.D., BCPS []  [place], Pharm.D., BCPS, AAHIVP []  Legrand Como, Pharm.D., BCPS, AAHIVP []  Salome Arnt, PharmD, BCPS []  Jalene Mullet, PharmD []  Vincenza Hews, PharmD, BCPS  Positive urine culture Treated with none, asymptomatic and no further patient follow-up is required at this time.  Hazle Nordmann 04/26/2017, 2:30 PM

## 2017-04-26 NOTE — Progress Notes (Signed)
ED Antimicrobial Stewardship Positive Culture Follow Up   HOLTON SIDMAN is an 77 y.o. male who presented to [facility] on 04/23/2017 with a chief complaint of  Chief Complaint  Patient presents with  . Seizures  . unresponsive    Recent Results (from the past 720 hour(s))  Urine Culture     Status: Abnormal   Collection Time: 04/15/17 10:25 PM  Result Value Ref Range Status   Specimen Description URINE, CLEAN CATCH  Final   Special Requests NONE  Final   Culture (A)  Final    >=100,000 COLONIES/mL STAPHYLOCOCCUS SPECIES (COAGULASE NEGATIVE)   Report Status 04/18/2017 FINAL  Final   Organism ID, Bacteria STAPHYLOCOCCUS SPECIES (COAGULASE NEGATIVE) (A)  Final      Susceptibility   Staphylococcus species (coagulase negative) - MIC*    CIPROFLOXACIN >=8 RESISTANT Resistant     GENTAMICIN <=0.5 SENSITIVE Sensitive     NITROFURANTOIN <=16 SENSITIVE Sensitive     OXACILLIN >=4 RESISTANT Resistant     TETRACYCLINE >=16 RESISTANT Resistant     VANCOMYCIN 1 SENSITIVE Sensitive     TRIMETH/SULFA 160 RESISTANT Resistant     CLINDAMYCIN <=0.25 SENSITIVE Sensitive     RIFAMPIN <=0.5 SENSITIVE Sensitive     Inducible Clindamycin NEGATIVE Sensitive     * >=100,000 COLONIES/mL STAPHYLOCOCCUS SPECIES (COAGULASE NEGATIVE)  Urine Culture     Status: Abnormal   Collection Time: 04/23/17  7:12 AM  Result Value Ref Range Status   Specimen Description URINE, CATHETERIZED  Final   Special Requests NONE  Final   Culture >=100,000 COLONIES/mL STAPHYLOCOCCUS EPIDERMIDIS (A)  Final   Report Status 04/25/2017 FINAL  Final   Organism ID, Bacteria STAPHYLOCOCCUS EPIDERMIDIS (A)  Final      Susceptibility   Staphylococcus epidermidis - MIC*    CIPROFLOXACIN >=8 RESISTANT Resistant     GENTAMICIN <=0.5 SENSITIVE Sensitive     NITROFURANTOIN <=16 SENSITIVE Sensitive     OXACILLIN >=4 RESISTANT Resistant     TETRACYCLINE >=16 RESISTANT Resistant     VANCOMYCIN 2 SENSITIVE Sensitive     TRIMETH/SULFA  160 RESISTANT Resistant     CLINDAMYCIN <=0.25 SENSITIVE Sensitive     RIFAMPIN <=0.5 SENSITIVE Sensitive     Inducible Clindamycin NEGATIVE Sensitive     * >=100,000 COLONIES/mL STAPHYLOCOCCUS EPIDERMIDIS    Discussed with provided and determined to be asymptomatic bacteriuria, no treatment needed. Patient also has follow-up appointment with neurology provider.   ED Provider: Alferd Apa, PA-C   Jalene Mullet, Pharm.D. PGY1 Pharmacy Resident 04/26/2017 9:39 AM Main Pharmacy: [phone] Phone# [phone]

## 2017-04-26 NOTE — Patient Instructions (Addendum)
1.  Start lamictal 25 mg twice per day for 2 weeks and then take 50 mg twice per day thereafter.  You call and let us know or if any rash.   2.  Stop the keppra

## 2017-05-31 ENCOUNTER — Telehealth: Payer: Self-pay | Admitting: Emergency Medicine

## 2017-06-01 ENCOUNTER — Telehealth: Payer: Self-pay | Admitting: Neurology

## 2017-06-01 MED ORDER — LAMOTRIGINE 100 MG PO TABS: 100.0000 mg | ORAL_TABLET | Freq: Every day | ORAL | 1 refills | Status: DC

## 2017-06-01 NOTE — Telephone Encounter (Signed)
Spoke with Sam at hospice. He states patient had a seizure yesterday morning at 6:15 am while on bedside commode. He was not injured. Did not have a fall. Wife called EMS who were able to transport him back to his bed. He is okay today. Vital signs stable. He is taking Lamictal. Hospice just wanted [place] to be aware.   Dr. Carles Collet Juluis Rainier.

## 2017-06-01 NOTE — Telephone Encounter (Signed)
Sam made aware. He will make sure medication gets changed. NEW Rx sent to [facility]. LMOM making patient/wife aware.

## 2017-06-01 NOTE — Telephone Encounter (Signed)
Sam from Hospice left a VM message wanting to let Dr Tat know that pt had a seizure this morning and would like a call back concerning this CB# [phone]

## 2017-06-01 NOTE — Telephone Encounter (Signed)
If just on lamictal 50 mg twice per day, increase to 75 mg bid for a week and then 100 mg bid thereafter.

## 2017-06-01 NOTE — Addendum Note (Signed)
Addended byAnnamaria Helling on: 06/01/2017 12:31 PM   Modules accepted: Orders

## 2017-06-15 ENCOUNTER — Encounter (HOSPITAL_COMMUNITY): Payer: Self-pay

## 2017-06-15 ENCOUNTER — Emergency Department (HOSPITAL_COMMUNITY)

## 2017-06-15 ENCOUNTER — Other Ambulatory Visit: Payer: Self-pay

## 2017-06-15 ENCOUNTER — Emergency Department (HOSPITAL_COMMUNITY)
Admission: EM | Admit: 2017-06-15 | Discharge: 2017-06-15 | Disposition: A | Discharge status: Home / Self Care | Attending: Emergency Medicine | Admitting: Emergency Medicine

## 2017-06-15 DIAGNOSIS — N1330 Unspecified hydronephrosis: Secondary | ICD-10-CM | POA: Diagnosis not present

## 2017-06-15 DIAGNOSIS — Z794 Long term (current) use of insulin: Secondary | ICD-10-CM | POA: Insufficient documentation

## 2017-06-15 DIAGNOSIS — Z79899 Other long term (current) drug therapy: Secondary | ICD-10-CM | POA: Diagnosis not present

## 2017-06-15 DIAGNOSIS — R1084 Generalized abdominal pain: Secondary | ICD-10-CM | POA: Diagnosis present

## 2017-06-15 DIAGNOSIS — N201 Calculus of ureter: Secondary | ICD-10-CM | POA: Insufficient documentation

## 2017-06-15 DIAGNOSIS — I10 Essential (primary) hypertension: Secondary | ICD-10-CM | POA: Diagnosis not present

## 2017-06-15 DIAGNOSIS — N200 Calculus of kidney: Secondary | ICD-10-CM

## 2017-06-15 DIAGNOSIS — E119 Type 2 diabetes mellitus without complications: Secondary | ICD-10-CM | POA: Insufficient documentation

## 2017-06-15 DIAGNOSIS — Z856 Personal history of leukemia: Secondary | ICD-10-CM | POA: Insufficient documentation

## 2017-06-15 DIAGNOSIS — N134 Hydroureter: Secondary | ICD-10-CM | POA: Insufficient documentation

## 2017-06-15 DIAGNOSIS — R110 Nausea: Secondary | ICD-10-CM | POA: Diagnosis not present

## 2017-06-15 DIAGNOSIS — N3001 Acute cystitis with hematuria: Secondary | ICD-10-CM | POA: Diagnosis not present

## 2017-06-15 DIAGNOSIS — I2510 Atherosclerotic heart disease of native coronary artery without angina pectoris: Secondary | ICD-10-CM | POA: Insufficient documentation

## 2017-06-15 LAB — COMPREHENSIVE METABOLIC PANEL
ALBUMIN: 4 g/dL (ref 3.5–5.0)
ALK PHOS: 129 U/L — AB (ref 38–126)
ALT: 11 U/L — ABNORMAL LOW (ref 17–63)
AST: 11 U/L — AB (ref 15–41)
Anion gap: 13 (ref 5–15)
BILIRUBIN TOTAL: 0.8 mg/dL (ref 0.3–1.2)
BUN: 14 mg/dL (ref 6–20)
CALCIUM: 9.4 mg/dL (ref 8.9–10.3)
CO2: 31 mmol/L (ref 22–32)
Chloride: 98 mmol/L — ABNORMAL LOW (ref 101–111)
Creatinine, Ser: 1.06 mg/dL (ref 0.61–1.24)
GFR calc Af Amer: >60 mL/min (ref >60)
GFR calc non Af Amer: >60 mL/min (ref >60)
GLUCOSE: 136 mg/dL — AB (ref 65–99)
Potassium: 4.1 mmol/L (ref 3.5–5.1)
Sodium: 142 mmol/L (ref 135–145)
TOTAL PROTEIN: 7.1 g/dL (ref 6.5–8.1)

## 2017-06-15 LAB — URINALYSIS, ROUTINE W REFLEX MICROSCOPIC
Bilirubin Urine: NEGATIVE
Glucose, UA: NEGATIVE mg/dL
KETONES UR: NEGATIVE mg/dL
Nitrite: POSITIVE — AB
PH: 6 (ref 5.0–8.0)
Protein, ur: 100 mg/dL — AB
SPECIFIC GRAVITY, URINE: 1.014 (ref 1.005–1.030)
SQUAMOUS EPITHELIAL / LPF: NONE SEEN

## 2017-06-15 LAB — I-STAT CHEM 8, ED
BUN: 13 mg/dL (ref 6–20)
CALCIUM ION: 1.08 mmol/L — AB (ref 1.15–1.40)
CREATININE: 1.1 mg/dL (ref 0.61–1.24)
Chloride: 94 mmol/L — ABNORMAL LOW (ref 101–111)
GLUCOSE: 136 mg/dL — AB (ref 65–99)
HCT: 47 % (ref 39.0–52.0)
HEMOGLOBIN: 16 g/dL (ref 13.0–17.0)
Potassium: 3.9 mmol/L (ref 3.5–5.1)
Sodium: 139 mmol/L (ref 135–145)
TCO2: 32 mmol/L (ref 22–32)

## 2017-06-15 LAB — CBC WITH DIFFERENTIAL/PLATELET
BASOS PCT: 0 %
Basophils Absolute: 0 10*3/uL (ref 0.0–0.1)
EOS ABS: 0 10*3/uL (ref 0.0–0.7)
EOS PCT: 0 %
HEMATOCRIT: 45.6 % (ref 39.0–52.0)
HEMOGLOBIN: 14.3 g/dL (ref 13.0–17.0)
LYMPHS PCT: 3 %
Lymphs Abs: 0.4 10*3/uL — ABNORMAL LOW (ref 0.7–4.0)
MCH: 30.8 pg (ref 26.0–34.0)
MCHC: 31.4 g/dL (ref 30.0–36.0)
MCV: 98.1 fL (ref 78.0–100.0)
MONO ABS: 6.7 10*3/uL — AB (ref 0.1–1.0)
Monocytes Relative: 51 %
Neutro Abs: 6 10*3/uL (ref 1.7–7.7)
Neutrophils Relative %: 46 %
Platelets: 46 10*3/uL — ABNORMAL LOW (ref 150–400)
RBC: 4.65 MIL/uL (ref 4.22–5.81)
RDW: 16.9 % — ABNORMAL HIGH (ref 11.5–15.5)
WBC: 13.1 10*3/uL — AB (ref 4.0–10.5)

## 2017-06-15 LAB — LIPASE, BLOOD: Lipase: 23 U/L (ref 11–51)

## 2017-06-15 MED ORDER — SODIUM CHLORIDE 0.9 % IJ SOLN
INTRAMUSCULAR | Status: AC
  Filled 2017-06-15: qty 50

## 2017-06-15 MED ORDER — MORPHINE SULFATE (PF) 2 MG/ML IV SOLN
2.0000 mg | Freq: Once | INTRAVENOUS | Status: AC
  Administered 2017-06-15: 2 mg via INTRAVENOUS
  Filled 2017-06-15: qty 1

## 2017-06-15 MED ORDER — SULFAMETHOXAZOLE-TRIMETHOPRIM 800-160 MG PO TABS: 1.0000 | ORAL_TABLET | Freq: Two times a day (BID) | ORAL | 0 refills | Status: AC

## 2017-06-15 MED ORDER — ONDANSETRON HCL 4 MG/2ML IJ SOLN
4.0000 mg | Freq: Once | INTRAMUSCULAR | Status: AC
  Administered 2017-06-15: 4 mg via INTRAVENOUS
  Filled 2017-06-15: qty 2

## 2017-06-15 MED ORDER — IOPAMIDOL (ISOVUE-300) INJECTION 61%
INTRAVENOUS | Status: AC
  Administered 2017-06-15: 100 mL via INTRAVENOUS
  Filled 2017-06-15: qty 100

## 2017-06-15 MED ORDER — SODIUM CHLORIDE 0.9 % IV BOLUS (SEPSIS)
1000.0000 mL | Freq: Once | INTRAVENOUS | Status: AC
  Administered 2017-06-15: 1000 mL via INTRAVENOUS

## 2017-06-15 NOTE — Progress Notes (Signed)
[facility] Liaison: RN visit  Notified by [facility] RN that patient was being transported by [facility] to ED for evaluation of possible bowel obstruction.   Visited with patient and his wife at bedside. Patient and his wife state he is having pain in lower abdomen and has been constipated for several days. Patient denies other symptoms. Offered support and answered questions.   Current plan is to do lab work, CT of abdomen.   Hospice will continue to follow and anticpate needs.  Please call with any hospice related questions.  Please call [phone] if ambulance transport is needed at discharge.  Thank you,  Farrel Gordon, RN, [facility] Liaison [place] are on AMION

## 2017-06-15 NOTE — Discharge Instructions (Signed)
TAKE 8 CAPFULS OF MIRALAX IN A 32 OUNCE GATORADE AND DRINK THE WHOLE BEVERAGE Also do a fleets enema.

## 2017-06-15 NOTE — ED Triage Notes (Addendum)
Pt to ED from home with complaints of Nausea and abdominal pain in LLQ that started upon awakening at 0630.  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 Pt has a Foley placed upon arrival with visual sediment. Pt is a current hospice patient. Pt is in remission for Leukemia, history of Diabetes, and Parkinsonism. Hospice nurse gave a saline enema with no relief and last bowel movement was on Monday 06/12/17. Last seizures May 31 2017   EMS Vitals 114/64 102 26 RR 95% RA 152 CBG

## 2017-06-15 NOTE — ED Provider Notes (Signed)
[facility] DEPT Provider Note   CSN: [id number] Arrival date & time: 06/15/17  1522     History   Chief Complaint Chief Complaint  Patient presents with  . Abdominal Pain  . Nausea    HPI Nathan Kemp is a 77 y.o. male.  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  Over the past week the patient has not had a bowel movement.  Has been passing gas.  Denies vomiting.  There was a rectal exam done by hospice, he was not noted to be impacted.  The tried an enema without improvement.  Decision was made to come to the emergency department for evaluation for possible bowel obstruction.  He has had an appendectomy in the remote past.  Complaining of left lower quadrant abdominal pain.  Feels that it has been constant.  Denies radiation.  Denies penile or testicular pain.  Denies fevers or vomiting.  No history of similar pain.  No history of bowel obstruction.   The history is provided by the patient.  Abdominal Pain   This is a new problem. The current episode started more than 2 days ago. The problem occurs constantly. The problem has been rapidly worsening. The pain is located in the suprapubic region. The quality of the pain is sharp, shooting and cramping. The pain is at a severity of 8/10. The pain is moderate. Associated symptoms include nausea and constipation. Pertinent negatives include fever, diarrhea, vomiting, headaches, arthralgias and myalgias. Nothing aggravates the symptoms. Nothing relieves the symptoms.    Past Medical History:  Diagnosis Date  . Acute GI bleeding 04/16/2011  . Anemia   . Anorexia   . Asthma    when  have congstion  of chest  . Blood dyscrasia 06/2009   thrombocytopenia  . Blood transfusion    platelets 12/12  . 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 Coronary artery disease    stent 06  . Cough   . DDD (degenerative disc disease), cervical   . Diabetes mellitus    borderline-controlled by diet  . ED (erectile dysfunction)   . Essential hypertension   . Fatigue   . GERD (gastroesophageal reflux disease)   . Heart murmur   . Hyperlipidemia   . Hypertension   . Hypothyroidism   . IBS (irritable bowel syndrome)   . Leg swelling    due to lymphedema  . Lymphedema: RLE 02/19/2015  . Obesity   . PMR (polymyalgia rheumatica) ([place]) 02/19/2015  . Pneumonia   . Polymyalgia ([place])   . Thrombocytopenia ([place])   . Thyroid disease   . Trouble swallowing   . Vitamin D deficiency     Patient Active Problem List   Diagnosis Date Noted  . AML (acute myelogenous leukemia) ([place]) 02/20/2015  . Leukocytosis 02/19/2015  . Hepatosplenomegaly 02/19/2015  . Lymphedema: RLE 02/19/2015  . PMR (polymyalgia rheumatica) (HCC) 02/19/2015  . Cerebral thrombosis with cerebral infarction ([place]) 10/29/2014  . GERD (gastroesophageal reflux disease) 10/28/2014  . Diabetes mellitus without complication ([place]) 09/73/5329  . History of GI bleed 10/28/2014  . Vertigo 10/28/2014  . Diarrhea 10/28/2014  . Essential hypertension   . Stroke with cerebral ischemia ([place])   . Acute GI bleeding 04/16/2011  . Skin cancer 04/15/2011  . Thrombocytopenia ([place]) 03/28/2011  . Lymphadenopathy, neck 03/08/2011  . CAD (coronary artery disease) 01/13/2011  . Hypothyroidism 03/29/2010  . SPLENOMEGALY 03/29/2010  . HEPATOMEGALY,  HX OF 03/29/2010  . FEVER, HX OF 03/29/2010  . HLD (hyperlipidemia) 09/07/2007  . HYPERTENSION 09/07/2007  . COUGH 09/07/2007    Past Surgical History:  Procedure Laterality Date  . APPENDECTOMY  1967  . BONE MARROW BIOPSY     2011-at [facility], 01/2010-at [place], 06/12-[facility]  . CARDIAC CATHETERIZATION  01/31/2005   EF 60-65%  . cataract Bilateral   . CORONARY ANGIOPLASTY WITH STENT PLACEMENT  2006   STENTING OF HIS CORONARY ARTERY  . EDG Dilitation  [id number]  . 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  LYMPH NODE BIOPSY  03/15/2011   Procedure: LYMPH NODE BIOPSY;  Surgeon: Earnstine Regal, MD;  Location: [facility] ORS;  Service: General;  Laterality: Right;  Right Anterior Cervical Lymph Node Excisional Biopsy   . lymph node removal  2011   right groin  . NASAL SINUS SURGERY  2005  . TONSILLECTOMY AND ADENOIDECTOMY    . US ECHOCARDIOGRAPHY  02/28/2005   EF 55-60%       Home Medications    Prior to Admission medications   Medication Sig Start Date End Date Taking? Authorizing Provider  clonazePAM (KLONOPIN) 1 MG tablet Take 1 mg by mouth 4 (four) times daily.   Yes [provider]  dicyclomine (BENTYL) 20 MG tablet Take 20 mg by mouth 2 (two) times daily.  12/11/12  Yes [provider]  diphenhydrAMINE (BENADRYL) 25 MG tablet Take 25 mg by mouth at bedtime as needed for sleep.    Yes [provider]  insulin regular (NOVOLIN R,HUMULIN R) 100 units/mL injection Inject 6 Units into the skin daily as needed for high blood sugar. For glucose greater than 175   Yes [provider]  lamoTRIgine (LAMICTAL) 100 MG tablet Take 1 tablet (100 mg total) by mouth daily. Patient taking differently: Take 100 mg by mouth 2 (two) times daily.  06/01/17  Yes Tat, Eustace Quail, DO  Polyethyl Glycol-Propyl Glycol (SYSTANE) 0.4-0.3 % GEL Apply 1 Dose to eye at bedtime.   Yes [provider]  predniSONE (DELTASONE) 10 MG tablet Take 10 mg by mouth daily with breakfast.  07/25/14  Yes [provider]  tamsulosin (FLOMAX) 0.4 MG CAPS capsule Take 0.4 mg by mouth daily.   Yes [provider]  traMADol (ULTRAM) 50 MG tablet Take 1 tablet (50 mg total) by mouth every 6 (six) hours as needed. Patient taking differently: Take 50 mg by mouth 4 (four) times daily.  04/11/15  Yes Mabe, Forbes Cellar, MD  zolpidem (AMBIEN) 10 MG tablet Take 10 mg by mouth at bedtime.    Yes [provider]  levETIRAcetam (KEPPRA) 500 MG tablet Take 1 tablet (500 mg total) by mouth 2  (two) times daily. 04/23/17   Orpah Greek, MD  nitroGLYCERIN (NITROSTAT) 0.4 MG SL tablet PLACE 1 TABLET UNDER THE TONGUE EVERY 5 MINUTES AS NEEDED FOR CHEST PAIN 07/11/14   Nahser, Wonda Cheng, MD  sulfamethoxazole-trimethoprim (BACTRIM DS,SEPTRA DS) 800-160 MG tablet Take 1 tablet by mouth 2 (two) times daily for 7 days. 06/15/17 06/22/17  Deno Etienne, DO    Family History Family History  Problem Relation Age of Onset  . Heart failure Father   . Hypertension Father   . Hearing loss Father 40       congestive heart failure  . Diabetes Father   . Hypertension Mother   . Stroke Mother 60  .  Heart attack Brother     Social History Social History   Tobacco Use  . Smoking status: Never Smoker  . Smokeless tobacco: Never Used  Substance Use Topics  . Alcohol use: No  . Drug use: No     Allergies   Cefdinir; Adhesive [tape]; Cephalosporins; Lodine [etodolac]; Lortab [hydrocodone-acetaminophen]; Niaspan [niacin]; Omnicef [cefdinir]; Valium [diazepam]; Clindamycin; and Codeine   Review of Systems Review of Systems  Constitutional: Negative for chills and fever.  HENT: Negative for congestion and facial swelling.   Eyes: Negative for discharge and visual disturbance.  Respiratory: Negative for shortness of breath.   Cardiovascular: Negative for chest pain and palpitations.  Gastrointestinal: Positive for abdominal pain, constipation and nausea. Negative for diarrhea and vomiting.  Musculoskeletal: Negative for arthralgias and myalgias.  Skin: Negative for color change and rash.  Neurological: Positive for speech difficulty (chronically). Negative for tremors, syncope and headaches.  Psychiatric/Behavioral: Negative for confusion and dysphoric mood.     Physical Exam Updated Vital Signs BP 134/87 (BP Location: Right Arm)   Pulse (!) 101   Temp 99.5 F (37.5 C) (Oral)   Resp 18   Ht 5' 9" (1.753 m)   Wt 83.9 kg (185 lb)   SpO2 98%   BMI 27.32 kg/m   Physical Exam    Constitutional: He is oriented to person, place, and time. He appears well-developed and well-nourished.  HENT:  Head: Normocephalic and atraumatic.  Eyes: EOM are normal. Pupils are equal, round, and reactive to light.  Neck: Normal range of motion. Neck supple. No JVD present.  Cardiovascular: Normal rate and regular rhythm. Exam reveals no gallop and no friction rub.  No murmur heard. Pulmonary/Chest: No respiratory distress. He has no wheezes.  Abdominal: He exhibits distension. There is tenderness. There is no rebound and no guarding.  Distended abdomen, tympanitic to percussion.  Significant tenderness worse in the left lower quadrant.  Musculoskeletal: Normal range of motion.  Neurological: He is alert and oriented to person, place, and time.  Skin: No rash noted. No pallor.  Psychiatric: He has a normal mood and affect. His behavior is normal.  Nursing note and vitals reviewed.    ED Treatments / Results  Labs (all labs ordered are listed, but only abnormal results are displayed) Labs Reviewed  CBC WITH DIFFERENTIAL/PLATELET - Abnormal; Notable for the following components:      Result Value   WBC 13.1 (*)    RDW 16.9 (*)    Platelets 46 (*)    Lymphs Abs 0.4 (*)    Monocytes Absolute 6.7 (*)    All other components within normal limits  COMPREHENSIVE METABOLIC PANEL - Abnormal; Notable for the following components:   Chloride 98 (*)    Glucose, Bld 136 (*)    AST 11 (*)    ALT 11 (*)    Alkaline Phosphatase 129 (*)    All other components within normal limits  URINALYSIS, ROUTINE W REFLEX MICROSCOPIC - Abnormal; Notable for the following components:   APPearance CLOUDY (*)    Hgb urine dipstick LARGE (*)    Protein, ur 100 (*)    Nitrite POSITIVE (*)    Leukocytes, UA LARGE (*)    Bacteria, UA MANY (*)    All other components within normal limits  I-STAT CHEM 8, ED - Abnormal; Notable for the following components:   Chloride 94 (*)    Glucose, Bld 136 (*)     Calcium, Ion 1.08 (*)    All  other components within normal limits  LIPASE, BLOOD  PATHOLOGIST SMEAR REVIEW    EKG  EKG Interpretation None       Radiology Ct Abdomen Pelvis W Contrast  Result Date: 06/15/2017 CLINICAL DATA:  Nausea and abdominal pain in the left lower quadrant, history of leukemia EXAM: CT ABDOMEN AND PELVIS WITH CONTRAST TECHNIQUE: Multidetector CT imaging of the abdomen and pelvis was performed using the standard protocol following bolus administration of intravenous contrast. CONTRAST:  159m ISOVUE-300 IOPAMIDOL (ISOVUE-300) INJECTION 61% COMPARISON:  Radiographs 02/16/2016, MRI spine 06/12/2012 FINDINGS: Lower chest: Small bilateral pleural effusions. Atelectasis at the left base. Partial consolidation in the right lower lobe. Coronary artery calcification. Borderline cardiomegaly. Trace pericardial effusion Hepatobiliary: Subcentimeter hypodensity in the central liver too small to further characterize. No calcified gallstones or biliary dilatation Pancreas: Unremarkable. No pancreatic ductal dilatation or surrounding inflammatory changes. Spleen: Markedly enlarged, measuring up to 19.2 cm craniocaudad. Circumscribed water density lesion in the upper spleen measuring 17 mm, possible cyst. Adrenals/Urinary Tract: Right adrenal gland is normal. Small 14 mm left adrenal gland nodule, indeterminate. Multiple cysts in the bilateral kidneys. Mild right hydronephrosis and proximal hydroureter, secondary to a 6 x 8 mm stone in the proximal right ureter, about 1-2 cm distal to the right UPJ. Additional punctate stone in the mid right kidney. The urinary bladder contains a Foley catheter and air. Possible punctate calcifications within the left posterior bladder adjacent to the Foley balloon. Stomach/Bowel: The stomach is nonenlarged. No dilated small bowel. No colon wall thickening. Moderate stool within the colon. Vascular/Lymphatic: Moderate aortic atherosclerosis. No aneurysmal  dilatation. Subcentimeter gastrohepatic and retrocaval upper abdominal lymph nodes. Multiple subcentimeter periaortic/retroperitoneal lymph nodes. Reproductive: Prostate is unremarkable. Other: Negative for free air or free fluid. Small fat in the left inguinal canal. Musculoskeletal: Treated compression fracture at T11. Chronic compression fracture at L2. Chronic mild compression fracture at L1. Possible subacute compression fracture at L3. Mild age indeterminate compression fracture at L4. Diffuse heterogeneous hypodensity within the spine and pelvis, may correspond to history of leukemia IMPRESSION: 1. Small bilateral pleural effusion with partial atelectasis or pneumonia at the right base 2. Mild right hydronephrosis and proximal hydroureter, secondary to a 6 x 8 mm stone in the proximal right ureter about 1-2 cm distal to the right UPJ. No left hydronephrosis or hydroureter. Possible punctate stones in the left posterior bladder near the Foley catheter, could relate to recently passed stones. 3. Massive splenomegaly. Circumscribed water density lesion in the spleen, possible cyst. Multiple small upper abdominal and retroperitoneal lymph nodes. 4. 14 mm indeterminate left adrenal gland nodule. Could evaluate with dedicated, nonemergent adrenal CT. 5. Heterogeneous mineralization of the spine and pelvis, likely related to history of leukemia. Chronic compression fractures at L1 and L2. Suspected subacute compression at L3 with age indeterminate mild compression at L4. Electronically Signed   By: KDonavan FoilM.D.   On: 06/15/2017 18:03    Procedures Procedures (including critical care time)  Medications Ordered in ED Medications  sodium chloride 0.9 % injection (not administered)  sodium chloride 0.9 % bolus 1,000 mL (1,000 mLs Intravenous New Bag/Given 06/15/17 1644)  morphine 2 MG/ML injection 2 mg (2 mg Intravenous Given 06/15/17 1700)  ondansetron (ZOFRAN) injection 4 mg (4 mg Intravenous Given  06/15/17 1700)  iopamidol (ISOVUE-300) 61 % injection (100 mLs Intravenous Contrast Given 06/15/17 1727)     Initial Impression / Assessment and Plan / ED Course  I have reviewed the triage vital signs and the nursing  notes.  Pertinent labs & imaging results that were available during my care of the patient were reviewed by me and considered in my medical decision making (see chart for details).     77 yo M with a chief complaint of left lower quadrant abdominal pain and constipation.  Patient has significant tenderness on my exam.  Will obtain labs CT scan.  CT with large stool burden on my eval. no obstruction read by radiology.  He was found to have a right-sided proximal 6 x 8 mm kidney stone.  He also has too numerous to count bacteria nitrate positive and leukocyte esterase positive urine.  I discussed the case with Dr. Noah Delaine, urology.  As the patient is not having any right flank pain he felt this was unlikely to be an infected stone.  Patient also does not have a fever.  He was a very mild leukocytosis.  I discussed with the family will start on antibiotics.  Have him follow-up as an outpatient.  Given instructions for a cleanout protocol for home.  7:04 PM:  I have discussed the diagnosis/risks/treatment options with the patient and family and believe the pt to be eligible for discharge home to follow-up with PCP, urology. We also discussed returning to the ED immediately if new or worsening sx occur. We discussed the sx which are most concerning (e.g., sudden worsening pain, fever, inability to tolerate by mouth) that necessitate immediate return. Medications administered to the patient during their visit and any new prescriptions provided to the patient are listed below.  Medications given during this visit Medications  sodium chloride 0.9 % injection (not administered)  sodium chloride 0.9 % bolus 1,000 mL (1,000 mLs Intravenous New Bag/Given 06/15/17 1644)  morphine 2 MG/ML  injection 2 mg (2 mg Intravenous Given 06/15/17 1700)  ondansetron (ZOFRAN) injection 4 mg (4 mg Intravenous Given 06/15/17 1700)  iopamidol (ISOVUE-300) 61 % injection (100 mLs Intravenous Contrast Given 06/15/17 1727)     The patient appears reasonably screen and/or stabilized for discharge and I doubt any other medical condition or other [facility] requiring further screening, evaluation, or treatment in the ED at this time prior to discharge.    Final Clinical Impressions(s) / ED Diagnoses   Final diagnoses:  Acute cystitis with hematuria  Nephrolithiasis    ED Discharge Orders        Ordered    sulfamethoxazole-trimethoprim (BACTRIM DS,SEPTRA DS) 800-160 MG tablet  2 times daily     06/15/17 1845       Deno Etienne, DO 06/15/17 1904

## 2017-06-16 ENCOUNTER — Telehealth: Payer: Self-pay | Admitting: Neurology

## 2017-06-16 ENCOUNTER — Other Ambulatory Visit: Payer: Self-pay | Admitting: Neurology

## 2017-06-16 LAB — PATHOLOGIST SMEAR REVIEW

## 2017-06-16 MED ORDER — LAMOTRIGINE 100 MG PO TABS: 100.0000 mg | ORAL_TABLET | Freq: Two times a day (BID) | ORAL | 1 refills | Status: AC

## 2017-06-16 NOTE — Telephone Encounter (Signed)
Spoke with patient's wife.   We had sent in an increase of Lamictal (100 mg) but they were using 50 mg tablets until he was out and put this on hold. She forgot what the medication is called and I gave her the name. She will call the pharmacy to have this filled.

## 2017-06-16 NOTE — Telephone Encounter (Signed)
Pt's spouse called and has a question about a medication and she discarded the bottle and doesn't know the name

## 2017-07-17 DEATH — deceased

## 2017-07-19 ENCOUNTER — Telehealth: Payer: Self-pay | Admitting: Neurology

## 2017-07-19 NOTE — Telephone Encounter (Signed)
Note was written and at the front for pick up.

## 2017-07-19 NOTE — Telephone Encounter (Signed)
Can I write this information?

## 2017-07-19 NOTE — Telephone Encounter (Signed)
LMOM making patient's wife aware and awaiting call back to see if she needs [place] to do this.

## 2017-07-19 NOTE — Telephone Encounter (Signed)
Patient's wife called to let Dr. Carles Collet know that her husband passed 06/26/2017. She is needing a letter stating that his Diagnosis was Primary Aggressive Aphasia. She said Medicare is requesting money back, so she is needing this letter. She also said he is having an autopsy done as well and and she will let her know. She said that he saw her on 10/13/16 and had his therapy 03/15/16. Please Call when ready. Thanks

## 2017-07-19 NOTE — Telephone Encounter (Signed)
Its fine to write his diagnosis on letterhead, but MCR would know the diagnosis as we have billed medicare for him

## 2017-07-21 ENCOUNTER — Telehealth: Payer: Self-pay | Admitting: Neurology

## 2017-07-21 NOTE — Telephone Encounter (Signed)
Pt's spouse called and wanted a letter to Medicare from Dr Tat stating that pt was a pt of hers and being treated for neurological issues, would like to pick up on Monday

## 2017-07-21 NOTE — Telephone Encounter (Signed)
Already written and at the front for pick up. I attempted to call patient's wife already.
